# Patient Record
Sex: Female | Born: 1960
Health system: Southern US, Community
[De-identification: ages and names within clinical notes are randomized; demographics above are authoritative.]

## PROBLEM LIST (undated history)

## (undated) DIAGNOSIS — F419 Anxiety disorder, unspecified: Secondary | ICD-10-CM

## (undated) DIAGNOSIS — Z96651 Presence of right artificial knee joint: Secondary | ICD-10-CM

## (undated) DIAGNOSIS — I219 Acute myocardial infarction, unspecified: Secondary | ICD-10-CM

## (undated) DIAGNOSIS — E119 Type 2 diabetes mellitus without complications: Secondary | ICD-10-CM

## (undated) DIAGNOSIS — I10 Essential (primary) hypertension: Secondary | ICD-10-CM

## (undated) DIAGNOSIS — M1712 Unilateral primary osteoarthritis, left knee: Secondary | ICD-10-CM

## (undated) DIAGNOSIS — F329 Major depressive disorder, single episode, unspecified: Secondary | ICD-10-CM

## (undated) DIAGNOSIS — I493 Ventricular premature depolarization: Secondary | ICD-10-CM

## (undated) DIAGNOSIS — F32A Depression, unspecified: Secondary | ICD-10-CM

## (undated) DIAGNOSIS — R0602 Shortness of breath: Secondary | ICD-10-CM

## (undated) DIAGNOSIS — N179 Acute kidney failure, unspecified: Secondary | ICD-10-CM

## (undated) DIAGNOSIS — M1990 Unspecified osteoarthritis, unspecified site: Secondary | ICD-10-CM

## (undated) DIAGNOSIS — R51 Headache: Secondary | ICD-10-CM

## (undated) DIAGNOSIS — R519 Headache, unspecified: Secondary | ICD-10-CM

## (undated) DIAGNOSIS — I255 Ischemic cardiomyopathy: Secondary | ICD-10-CM

## (undated) DIAGNOSIS — N189 Chronic kidney disease, unspecified: Secondary | ICD-10-CM

## (undated) DIAGNOSIS — K219 Gastro-esophageal reflux disease without esophagitis: Secondary | ICD-10-CM

## (undated) DIAGNOSIS — R002 Palpitations: Secondary | ICD-10-CM

## (undated) HISTORY — DX: Shortness of breath: R06.02

## (undated) HISTORY — DX: Acute kidney failure, unspecified: N17.9

## (undated) HISTORY — DX: Ischemic cardiomyopathy: I25.5

## (undated) HISTORY — PX: CHOLECYSTECTOMY: SHX55

## (undated) HISTORY — DX: Chronic kidney disease, unspecified: N18.9

## (undated) HISTORY — DX: Palpitations: R00.2

## (undated) HISTORY — PX: ABDOMINAL HYSTERECTOMY: SHX81

---

## 1998-03-19 ENCOUNTER — Other Ambulatory Visit: Admission: RE | Admit: 1998-03-19 | Discharge: 1998-03-19 | Payer: Self-pay | Admitting: Obstetrics and Gynecology

## 1999-12-14 ENCOUNTER — Other Ambulatory Visit: Admission: RE | Admit: 1999-12-14 | Discharge: 1999-12-14 | Payer: Self-pay | Admitting: Internal Medicine

## 2000-03-24 ENCOUNTER — Emergency Department (HOSPITAL_COMMUNITY): Admission: EM | Admit: 2000-03-24 | Discharge: 2000-03-24 | Payer: Self-pay | Admitting: Emergency Medicine

## 2000-03-24 ENCOUNTER — Encounter: Payer: Self-pay | Admitting: Emergency Medicine

## 2000-03-28 ENCOUNTER — Ambulatory Visit (HOSPITAL_COMMUNITY): Admission: RE | Admit: 2000-03-28 | Discharge: 2000-03-28 | Payer: Self-pay | Admitting: *Deleted

## 2001-01-24 ENCOUNTER — Other Ambulatory Visit: Admission: RE | Admit: 2001-01-24 | Discharge: 2001-01-24 | Payer: Self-pay | Admitting: Internal Medicine

## 2003-01-13 ENCOUNTER — Other Ambulatory Visit: Admission: RE | Admit: 2003-01-13 | Discharge: 2003-01-13 | Payer: Self-pay | Admitting: Obstetrics and Gynecology

## 2003-02-25 ENCOUNTER — Encounter: Admission: RE | Admit: 2003-02-25 | Discharge: 2003-02-25 | Payer: Self-pay | Admitting: Internal Medicine

## 2003-02-25 ENCOUNTER — Encounter: Payer: Self-pay | Admitting: Internal Medicine

## 2003-03-31 ENCOUNTER — Encounter (INDEPENDENT_AMBULATORY_CARE_PROVIDER_SITE_OTHER): Payer: Self-pay | Admitting: Specialist

## 2003-03-31 ENCOUNTER — Observation Stay (HOSPITAL_COMMUNITY): Admission: RE | Admit: 2003-03-31 | Discharge: 2003-04-01 | Payer: Self-pay | Admitting: Obstetrics and Gynecology

## 2003-09-05 ENCOUNTER — Encounter: Admission: RE | Admit: 2003-09-05 | Discharge: 2003-10-21 | Payer: Self-pay | Admitting: Internal Medicine

## 2004-02-09 ENCOUNTER — Emergency Department (HOSPITAL_COMMUNITY): Admission: EM | Admit: 2004-02-09 | Discharge: 2004-02-09 | Payer: Self-pay | Admitting: *Deleted

## 2004-02-13 ENCOUNTER — Inpatient Hospital Stay (HOSPITAL_COMMUNITY): Admission: AD | Admit: 2004-02-13 | Discharge: 2004-02-13 | Payer: Self-pay | Admitting: Obstetrics and Gynecology

## 2004-10-19 ENCOUNTER — Other Ambulatory Visit: Admission: RE | Admit: 2004-10-19 | Discharge: 2004-10-19 | Payer: Self-pay | Admitting: Obstetrics and Gynecology

## 2004-11-29 ENCOUNTER — Encounter: Admission: RE | Admit: 2004-11-29 | Discharge: 2004-11-29 | Payer: Self-pay | Admitting: Obstetrics and Gynecology

## 2004-12-01 ENCOUNTER — Encounter: Admission: RE | Admit: 2004-12-01 | Discharge: 2005-03-01 | Payer: Self-pay | Admitting: Family Medicine

## 2010-02-09 ENCOUNTER — Encounter: Admission: RE | Admit: 2010-02-09 | Discharge: 2010-02-09 | Payer: Self-pay | Admitting: Family Medicine

## 2011-01-07 NOTE — Discharge Summary (Signed)
   NAME:  Andrea Robertson, Andrea Robertson                        ACCOUNT NO.:  [id number]   MEDICAL RECORD NO.:  [id number]                   PATIENT TYPE:  OBV   LOCATION:  9324                                 FACILITY:  [facility]   PHYSICIAN:  Duke Salvia. Marcelle Overlie, M.D.            DATE OF BIRTH:  07/02/1961   DATE OF ADMISSION:  03/31/2003  DATE OF DISCHARGE:  04/01/2003                                 DISCHARGE SUMMARY   DISCHARGE DIAGNOSES:  1. Symptomatic uterine prolapse.  2. Total vaginal hysterectomy this admission.   SUMMARY OF THE HISTORY AND PHYSICAL EXAM:  Please see admission H&P for  details.  Briefly, a 50 year old with symptomatic uterine prolapse presents  for definitive TVH.   HOSPITAL COURSE:  On March 31, 2003, under general anesthesia, the patient  underwent an uncomplicated TVH.  EBL was 150 cc.  Catheter was removed  that  evening.  Her diet was advanced.  By the following morning, she was  afebrile, tolerating a regular diet and was ready for discharge.   LABORATORY DATA:  Preop lab work:  C-MET potassium was slightly low at 3.4.  Preop hemoglobin was 12.1.  UPT was negative.  Blood type is O positive.  UA  was negative except for trace hemoglobin.  Her postop hemoglobin on April 01, 2003 was 11.2.   DISPOSITION:  1. The patient was discharged on Tylox p.r.n. pain.  2. Return to the office in one week.  3. Advised to report any increased vaginal bleeding, fever over 101,     increased pain, persistent nausea, vomiting.  4. She was given specific instructions regarding diet, sex, exercise.   CONDITION:  Good.   ACTIVITY:  Gradually increase as tolerated.                                               Richard M. Marcelle Overlie, M.D.    RMH/MEDQ  D:  04/01/2003  T:  04/01/2003  Job:  [id number]

## 2011-01-07 NOTE — Op Note (Signed)
NAME:  Andrea Robertson, Andrea Robertson                        ACCOUNT NO.:  [id number]   MEDICAL RECORD NO.:  [id number]                   PATIENT TYPE:  INP   LOCATION:  NA                                   FACILITY:  [facility]   PHYSICIAN:  Duke Salvia. Marcelle Overlie, M.D.            DATE OF BIRTH:  Jan 09, 1961   DATE OF PROCEDURE:  03/31/2003  DATE OF DISCHARGE:                                 OPERATIVE REPORT   PREOPERATIVE DIAGNOSIS:  Symptomatic uterine prolapse.   POSTOPERATIVE DIAGNOSIS:  Symptomatic uterine prolapse.   PROCEDURE:  Total vaginal hysterectomy.   SURGEON:  Duke Salvia. Marcelle Overlie, M.D.   ASSISTANT:  Freddy Finner, M.D.   ANESTHESIA:  General endotracheal.   COMPLICATIONS:  None.   DRAINS:  Foley catheter.   ESTIMATED BLOOD LOSS:  150.   SPECIMENS:  Uterus.   DESCRIPTION OF PROCEDURE:  The patient went to the operating room and after  an adequate level of general endotracheal anesthesia was obtained with the  patient's legs in stirrups, the perineum and vagina were prepped with  Betadine, the bladder was drained, EUA carried, uterus was mid position  normal size, adnexa negative. A weighted speculum was positioned. The cervix  could be seen at the introitus. Cervix passed with a tenaculum, the cervical  vaginal mucosa was incised with a scalpel. Posterior culdotomy performed  without difficulty. Using the Ligasure system, the uterosacral ligament was  clamped, coagulated and cut, same on the opposite side. The bladder was  advanced superiorly until the peritoneal reflection could be identified.  This was entered sharply and a retractor used to gently elevate the bladder  out of the field. In sequential manner, the cardinal ligament, uterine  vasculature pedicle and upper broad ligament pedicles were clamped,  coagulated and cut. The fundus and the uterus was then delivered  posteriorly. The remaining utero-ovarian pedicles were clamped and divided,  specimen removed. Both ovaries  were inspected and noted to be small and  normal. These pedicles were tied with free tie followed by suture ligature  of #0 Vicryl. The cuff was closed with running 2-0 Dexon locked suture from  3 to 9 o'clock. A McCall culdoplasty suture was then placed from the left  uterosacral ligament, picking up the posterior peritoneum, attached to the  right uterosacral ligament and tied down for extra posterior support. Prior  to closure sponge, needle and instrument counts  were reported as correct x2. The remainder of the cuff was closed  transversely with figure-of-eight 2-0 Monocryl sutures. The cuff was  hemostatic, the bladder was drained with a Foley. She tolerated this well  and went to the recovery room in good condition.  Richard M. Marcelle Overlie, M.D.    RMH/MEDQ  D:  03/31/2003  T:  03/31/2003  Job:  [id number]

## 2011-01-07 NOTE — H&P (Signed)
   NAME:  Andrea Robertson, Andrea Robertson                        ACCOUNT NO.:  [id number]   MEDICAL RECORD NO.:  [id number]                   PATIENT TYPE:  INP   LOCATION:  NA                                   [facility]  [facility]   PHYSICIAN:  Duke Salvia. Marcelle Overlie, M.D.            DATE OF BIRTH:  05/23/1961   DATE OF ADMISSION:  DATE OF DISCHARGE:                                HISTORY & PHYSICAL   DATE OF SCHEDULED SURGERY:  Is March 31, 2003.   CHIEF COMPLAINT:  Pelvic pressure, uterine prolapse.   HISTORY OF PRESENT ILLNESS:  A 50 year old G4, P2, prior tubal.  When she  was last seen in my office in 2001, she had symptomatic uterine prolapse.  I  had not seen her in several years and when she returned in the Spring of  this year was still complaining of uterine prolapse.  On exam, she had  uterine descensus of a moderate degree, anterior and posterior support was  good.  She presents now for [facility].  This procedure including risk of bleeding,  infection, adjacent organ injury, the possible need for open or additional  surgery discussed.  Other risks regarding infection, phlebitis, transfusion  reviewed.   MEDICATIONS:  1. Clarinex p.r.n.  2. Zoloft.  3. Norvasc 10 mg daily.  4. Diovan/HCTZ 80/12.5.   REVIEW OF SYSTEMS:  Significant for essential hypertension, positive sickle  cell trait.  She has two living children, both delivered vaginally.  A prior  tubal ligation in 1996 findings at the time were normal.   FAMILY HISTORY:  Significant for father with acute MI.   ALLERGIES:  1. PENICILLIN.  2. ERYTHROMYCIN.   PHYSICAL EXAMINATION:  VITAL SIGNS:  Temp 98.2, blood pressure 140/98.  HEENT:  Unremarkable.  NECK:  Supple without mass.  LUNGS:  Clear.  CARDIOVASCULAR:  Regular, rate and rhythm without murmurs, rubs or gallops  noted.  BREASTS:  Without masses.  ABDOMEN:  Soft, flat, nontender.  PELVIC:  Normal external genitalia.  Vagina and cervix clear.  On straining,  the cervix is noted  at the introitus.  Anterior and posterior support is  good.  Adnexa unremarkable.  EXTREMITIES:  Unremarkable.  NEUROLOGIC:  Unremarkable.    IMPRESSION:  Symptomatic uterine descensus.   PLAN:  TVH procedure and risks reviewed as above.                                                Richard M. Marcelle Overlie, M.D.    RMH/MEDQ  D:  03/21/2003  T:  03/21/2003  Job:  [id number]

## 2011-01-07 NOTE — Procedures (Signed)
[place]  Patient:    Andrea Robertson, Andrea Robertson                     MRN: [id number] Adm. Date:  [id number] Attending:  Sabino Gasser                           Procedure Report  PROCEDURE:  Colonoscopy.  INDICATIONS:  Rectal bleeding.  ANESTHESIA:  Demerol 100 mg, Versed 10 mg were given intravenously in divided dose.  DESCRIPTION OF PROCEDURE:  With the patient mildly sedated in the left lateral decubitus position, the Olympus videoscopic colonoscope was inserted in the rectum and passed under direct vision to the cecum.  Cecum was identified by transillumination in the right lower quadrant and by visualization of the ileocecal valve and cecal cap.  Photographs were taken.  From this point, the colonoscope was slowly withdrawn after suctioning and diluting fecal debris from the cecum and slowly withdrawn to the rectum, which appeared normal on direct view and showed internal hemorrhoids on retroflexed view.  The endoscope was straightened and withdrawn.  The patients vital signs and pulse oximetry remained stable.  The patient tolerated the procedure well and without apparent complications.  FINDINGS:  Internal hemorrhoids, which may have been the cause of the patients rectal bleeding, otherwise unremarkable colonoscopic examination to the cecum.  A somewhat limited prep in the cecum, but otherwise unremarkable.  PLAN:  Follow up with me on an as-needed basis. DD:  03/28/00 TD:  03/28/00 Job: [id number]

## 2011-06-24 ENCOUNTER — Other Ambulatory Visit: Payer: Self-pay | Admitting: Internal Medicine

## 2011-06-24 DIAGNOSIS — M25561 Pain in right knee: Secondary | ICD-10-CM

## 2011-06-30 ENCOUNTER — Ambulatory Visit
Admission: RE | Admit: 2011-06-30 | Discharge: 2011-06-30 | Disposition: A | Discharge status: Home / Self Care | Payer: BC Managed Care – PPO | Source: Ambulatory Visit | Attending: Internal Medicine | Admitting: Internal Medicine

## 2011-06-30 DIAGNOSIS — M25561 Pain in right knee: Secondary | ICD-10-CM

## 2011-12-20 ENCOUNTER — Other Ambulatory Visit: Payer: Self-pay | Admitting: Internal Medicine

## 2011-12-20 DIAGNOSIS — Z1231 Encounter for screening mammogram for malignant neoplasm of breast: Secondary | ICD-10-CM

## 2011-12-21 ENCOUNTER — Ambulatory Visit: Payer: BC Managed Care – PPO

## 2011-12-22 ENCOUNTER — Ambulatory Visit
Admission: RE | Admit: 2011-12-22 | Discharge: 2011-12-22 | Disposition: A | Discharge status: Home / Self Care | Payer: BC Managed Care – PPO | Source: Ambulatory Visit | Attending: Internal Medicine | Admitting: Internal Medicine

## 2011-12-22 DIAGNOSIS — Z1231 Encounter for screening mammogram for malignant neoplasm of breast: Secondary | ICD-10-CM

## 2014-02-04 ENCOUNTER — Other Ambulatory Visit: Payer: Self-pay | Admitting: Internal Medicine

## 2014-02-04 DIAGNOSIS — M25512 Pain in left shoulder: Secondary | ICD-10-CM

## 2014-04-06 ENCOUNTER — Other Ambulatory Visit: Payer: BC Managed Care – PPO

## 2015-03-06 ENCOUNTER — Emergency Department (HOSPITAL_COMMUNITY)
Admission: EM | Admit: 2015-03-06 | Discharge: 2015-03-06 | Disposition: A | Discharge status: Home / Self Care | Payer: BLUE CROSS/BLUE SHIELD | Attending: Emergency Medicine | Admitting: Emergency Medicine

## 2015-03-06 ENCOUNTER — Emergency Department (HOSPITAL_COMMUNITY): Payer: BLUE CROSS/BLUE SHIELD

## 2015-03-06 ENCOUNTER — Encounter (HOSPITAL_COMMUNITY): Payer: Self-pay

## 2015-03-06 DIAGNOSIS — I493 Ventricular premature depolarization: Secondary | ICD-10-CM | POA: Diagnosis not present

## 2015-03-06 DIAGNOSIS — R002 Palpitations: Secondary | ICD-10-CM | POA: Diagnosis present

## 2015-03-06 DIAGNOSIS — I10 Essential (primary) hypertension: Secondary | ICD-10-CM | POA: Diagnosis not present

## 2015-03-06 DIAGNOSIS — F419 Anxiety disorder, unspecified: Secondary | ICD-10-CM | POA: Diagnosis not present

## 2015-03-06 DIAGNOSIS — E876 Hypokalemia: Secondary | ICD-10-CM | POA: Diagnosis not present

## 2015-03-06 DIAGNOSIS — Z880 Allergy status to penicillin: Secondary | ICD-10-CM | POA: Diagnosis not present

## 2015-03-06 HISTORY — DX: Depression, unspecified: F32.A

## 2015-03-06 HISTORY — DX: Major depressive disorder, single episode, unspecified: F32.9

## 2015-03-06 HISTORY — DX: Essential (primary) hypertension: I10

## 2015-03-06 LAB — CBC
HCT: 35.9 % — ABNORMAL LOW (ref 36.0–46.0)
HEMOGLOBIN: 12.5 g/dL (ref 12.0–15.0)
MCH: 28.4 pg (ref 26.0–34.0)
MCHC: 34.8 g/dL (ref 30.0–36.0)
MCV: 81.6 fL (ref 78.0–100.0)
PLATELETS: 320 10*3/uL (ref 150–400)
RBC: 4.4 MIL/uL (ref 3.87–5.11)
RDW: 14.1 % (ref 11.5–15.5)
WBC: 12.3 10*3/uL — AB (ref 4.0–10.5)

## 2015-03-06 LAB — BASIC METABOLIC PANEL
ANION GAP: 7 (ref 5–15)
BUN: 7 mg/dL (ref 6–20)
CO2: 26 mmol/L (ref 22–32)
Calcium: 9 mg/dL (ref 8.9–10.3)
Chloride: 105 mmol/L (ref 101–111)
Creatinine, Ser: 1.12 mg/dL — ABNORMAL HIGH (ref 0.44–1.00)
GFR, EST NON AFRICAN AMERICAN: 56 mL/min — AB (ref >60)
Glucose, Bld: 127 mg/dL — ABNORMAL HIGH (ref 65–99)
POTASSIUM: 3.3 mmol/L — AB (ref 3.5–5.1)
SODIUM: 138 mmol/L (ref 135–145)

## 2015-03-06 LAB — I-STAT TROPONIN, ED: Troponin i, poc: 0.01 ng/mL (ref 0.00–0.08)

## 2015-03-06 MED ORDER — POTASSIUM CHLORIDE CRYS ER 20 MEQ PO TBCR: 20.0000 meq | EXTENDED_RELEASE_TABLET | Freq: Every day | ORAL | Status: DC

## 2015-03-06 MED ORDER — POTASSIUM CHLORIDE CRYS ER 20 MEQ PO TBCR
40.0000 meq | EXTENDED_RELEASE_TABLET | Freq: Once | ORAL | Status: AC
  Administered 2015-03-06: 40 meq via ORAL
  Filled 2015-03-06: qty 2

## 2015-03-06 NOTE — ED Notes (Signed)
Pt reports she has felt her heart "skipping beats" x2 days.  Pt denies any chest pain but reports some mild SOB.  Pt is tearful upon assessment.

## 2015-03-06 NOTE — ED Notes (Signed)
Pt ambulated to the bathroom with ease 

## 2015-03-06 NOTE — Discharge Instructions (Signed)
It was our pleasure to provide your ER care today - we hope that you feel better.  Your potassium level is mildly low today (3.3) - take potassium supplement as prescribed, eat plenty of fruits and vegetables, and follow up with primary care doctor in 1 week for recheck.  Also have your blood pressure rechecked then as it is high today.   Avoid caffeine.  Return to ER if worse, persistent fast heart beat, fainting, chest pain, trouble breathing, other concern.         Premature Ventricular Contraction Premature ventricular contraction (PVC) is an irregularity of the heart rhythm involving extra or skipped heartbeats. In some cases, they may occur without obvious cause or heart disease. Other times, they can be caused by an electrolyte change in the blood. These need to be corrected. They can also be seen when there is not enough oxygen going to the heart. A common cause of this is plaque or cholesterol buildup. This buildup decreases the blood supply to the heart. In addition, extra beats may be caused or aggravated by:  Excessive smoking.  Alcohol consumption.  Caffeine.  Certain medications  Some street drugs. SYMPTOMS   The sensation of feeling your heart skipping a beat (palpitations).  In many cases, the person may have no symptoms. SIGNS AND TESTS   A physical examination may show an occasional irregularity, but if the PVC beats do not happen often, they may not be found on physical exam.  Blood pressure is usually normal.  Other tests that may find extra beats of the heart are:  An EKG (electrocardiogram)  A Holter monitor which can monitor your heart over longer periods of time  An Angiogram (study of the heart arteries). TREATMENT  Usually extra heartbeats do not need treatment. The condition is treated only if symptoms are severe or if extra beats are very frequent or are causing problems. An underlying cause, if discovered, may also require treatment.    Treatment may also be needed if there may be a risk for other more serious cardiac arrhythmias.  PREVENTION   Moderation in caffeine, alcohol, and tobacco use may reduce the risk of ectopic heartbeats in some people.  Exercise often helps people who lead a sedentary (inactive) lifestyle. PROGNOSIS  PVC heartbeats are generally harmless and do not need treatment.  RISKS AND COMPLICATIONS   Ventricular tachycardia (occasionally).  There usually are no complications.  Other arrhythmias (occasionally). SEEK IMMEDIATE MEDICAL CARE IF:   You feel palpitations that are frequent or continual.  You develop chest pain or other problems such as shortness of breath, sweating, or nausea and vomiting.  You become light-headed or faint (pass out).  You get worse or do not improve with treatment. Document Released: 03/25/2004 Document Revised: 10/31/2011 Document Reviewed: 10/05/2007 [facility] Patient Information 2015 [place], [place]. This information is not intended to replace advice given to you by your health care provider. Make sure you discuss any questions you have with your health care provider.    Hypokalemia Hypokalemia means that the amount of potassium in the blood is lower than normal.Potassium is a chemical, called an electrolyte, that helps regulate the amount of fluid in the body. It also stimulates muscle contraction and helps nerves function properly.Most of the body's potassium is inside of cells, and only a very small amount is in the blood. Because the amount in the blood is so small, minor changes can be life-threatening. CAUSES  Antibiotics.  Diarrhea or vomiting.  Using laxatives too  much, which can cause diarrhea.  Chronic kidney disease.  Water pills (diuretics).  Eating disorders (bulimia).  Low magnesium level.  Sweating a lot. SIGNS AND SYMPTOMS  Weakness.  Constipation.  Fatigue.  Muscle cramps.  Mental confusion.  Skipped heartbeats or  irregular heartbeat (palpitations).  Tingling or numbness. DIAGNOSIS  Your health care provider can diagnose hypokalemia with blood tests. In addition to checking your potassium level, your health care provider may also check other lab tests. TREATMENT Hypokalemia can be treated with potassium supplements taken by mouth or adjustments in your current medicines. If your potassium level is very low, you may need to get potassium through a vein (IV) and be monitored in the hospital. A diet high in potassium is also helpful. Foods high in potassium are:  Nuts, such as peanuts and pistachios.  Seeds, such as sunflower seeds and pumpkin seeds.  Peas, lentils, and lima beans.  Whole grain and bran cereals and breads.  Fresh fruit and vegetables, such as apricots, avocado, bananas, cantaloupe, kiwi, oranges, tomatoes, asparagus, and potatoes.  Orange and tomato juices.  Red meats.  Fruit yogurt. HOME CARE INSTRUCTIONS  Take all medicines as prescribed by your health care provider.  Maintain a healthy diet by including nutritious food, such as fruits, vegetables, nuts, whole grains, and lean meats.  If you are taking a laxative, be sure to follow the directions on the label. SEEK MEDICAL CARE IF:  Your weakness gets worse.  You feel your heart pounding or racing.  You are vomiting or having diarrhea.  You are diabetic and having trouble keeping your blood glucose in the normal range. SEEK IMMEDIATE MEDICAL CARE IF:  You have chest pain, shortness of breath, or dizziness.  You are vomiting or having diarrhea for more than 2 days.  You faint. MAKE SURE YOU:   Understand these instructions.  Will watch your condition.  Will get help right away if you are not doing well or get worse. Document Released: 08/08/2005 Document Revised: 05/29/2013 Document Reviewed: 02/08/2013 [facility] Patient Information 2015 [place], [place]. This information is not intended to replace advice  given to you by your health care provider. Make sure you discuss any questions you have with your health care provider.

## 2015-03-06 NOTE — ED Provider Notes (Signed)
CSN: [id number]     Arrival date & time 03/06/15  0177 History   First MD Initiated Contact with Patient 03/06/15 0940     Chief Complaint  Patient presents with  . Irregular Heart Beat     (Consider location/radiation/quality/duration/timing/severity/associated sxs/prior Treatment) The history is provided by the patient.  Patient c/o sense of palpitations which she described as a sense of heart skipping a beat, lasting a second or so per episode, for the past couple days. Occurred more frequently at rest last pm. States saw nurse at work and was told heart beat was irregular. No rapid/fast heartbeat. No faintness or dizziness. No current or recent chest pain or discomfort. No syncope. No unusual doe or fatigue. No hx dysrhythmia or cad. No recent change in meds or new meds. No heavy caffeine use or energy drink use. Denies any recent skin/hair changes, wt loss or gain, heat intolerance, or hx thyroid disease.       Past Medical History  Diagnosis Date  . Hypertension   . Depression    Past Surgical History  Procedure Laterality Date  . Abdominal hysterectomy    . Cholecystectomy     History reviewed. No pertinent family history. History  Substance Use Topics  . Smoking status: Never Smoker   . Smokeless tobacco: Not on file  . Alcohol Use: Yes     Comment: Social drinker   OB History    No data available     Review of Systems  Constitutional: Negative for fever and chills.  Eyes: Negative for redness.  Respiratory: Negative for shortness of breath.   Cardiovascular: Positive for palpitations. Negative for chest pain.  Gastrointestinal: Negative for abdominal pain.  Genitourinary: Negative for flank pain.  Musculoskeletal: Negative for back pain and neck pain.  Skin: Negative for rash.  Neurological: Negative for headaches.  Hematological: Does not bruise/bleed easily.  Psychiatric/Behavioral: Negative for confusion.      Allergies  Erythromycin; Penicillins;  and Zithromax  Home Medications   Prior to Admission medications   Not on File   BP 144/88 mmHg  Pulse 93  Temp(Src) 98.6 F (37 C) (Oral)  SpO2 99% Physical Exam  Constitutional: She appears well-developed and well-nourished. No distress.  HENT:  Mouth/Throat: Oropharynx is clear and moist.  Eyes: Conjunctivae are normal. No scleral icterus.  Neck: Neck supple. No tracheal deviation present. No thyromegaly present.  Cardiovascular: Normal rate, regular rhythm and intact distal pulses.  Exam reveals no gallop and no friction rub.   No murmur heard. Pulmonary/Chest: Effort normal and breath sounds normal. No respiratory distress.  Abdominal: Soft. Normal appearance and bowel sounds are normal. She exhibits no distension. There is no tenderness.  Musculoskeletal: She exhibits no edema or tenderness.  Neurological: She is alert.  Skin: Skin is warm and dry. No rash noted. She is not diaphoretic.  Psychiatric:  Anxious.  Nursing note and vitals reviewed.   ED Course  Procedures (including critical care time) Labs Review  Results for orders placed or performed during the hospital encounter of 03/06/15  Basic metabolic panel  Result Value Ref Range   Sodium 138 135 - 145 mmol/L   Potassium 3.3 (L) 3.5 - 5.1 mmol/L   Chloride 105 101 - 111 mmol/L   CO2 26 22 - 32 mmol/L   Glucose, Bld 127 (H) 65 - 99 mg/dL   BUN 7 6 - 20 mg/dL   Creatinine, Ser 9.39 (H) 0.44 - 1.00 mg/dL   Calcium 9.0 8.9 -  10.3 mg/dL   GFR calc non Af Amer 56 (L) >60 mL/min   GFR calc Af Amer >60 >60 mL/min   Anion gap 7 5 - 15  CBC  Result Value Ref Range   WBC 12.3 (H) 4.0 - 10.5 K/uL   RBC 4.40 3.87 - 5.11 MIL/uL   Hemoglobin 12.5 12.0 - 15.0 g/dL   HCT 78.2 (L) 95.6 - 21.3 %   MCV 81.6 78.0 - 100.0 fL   MCH 28.4 26.0 - 34.0 pg   MCHC 34.8 30.0 - 36.0 g/dL   RDW 08.6 57.8 - 46.9 %   Platelets 320 150 - 400 K/uL  I-stat troponin, ED  Result Value Ref Range   Troponin i, poc 0.01 0.00 - 0.08  ng/mL   Comment 3           Dg Chest 2 View  03/06/2015   CLINICAL DATA:  Shortness of breath with cardiac palpitations.  EXAM: CHEST  2 VIEW  COMPARISON:  None.  FINDINGS: Lungs are clear. Heart size and pulmonary vascularity are normal. No adenopathy. No bone lesions. There is slight thoracolumbar levoscoliosis.  IMPRESSION: No edema or consolidation.   Electronically Signed   By: Bretta Bang III M.D.   On: 03/06/2015 10:04       EKG Interpretation   Date/Time:  Friday March 06 2015 09:31:02 EDT Ventricular Rate:  92 PR Interval:  156 QRS Duration: 86 QT Interval:  376 QTC Calculation: 465 R Axis:   0 Text Interpretation:  Sinus tachycardia Premature ventricular complexes No  significant change since last tracing Confirmed by Denton Lank  MD, Caryn Bee  ([id number]) on 03/06/2015 9:43:14 AM      MDM   Monitor. Labs.  pvcs on monitor correspond to pts symptoms.   Discussed pvcs w pt.  k mildly low, kcl po.  Recheck no dysrhythmia except occ pvc on monitor.  Pt appears stable for d/c.   kcl po rx. pcp f/u.        Cathren Laine, MD 03/06/15 1106

## 2015-09-29 ENCOUNTER — Other Ambulatory Visit: Payer: Self-pay | Admitting: Surgical

## 2015-10-02 NOTE — Patient Instructions (Signed)
Andrea Robertson  10/02/2015   Your procedure is scheduled on: October 13, 2015  Report to [facility] Main  Entrance take [facility]  elevators to 3rd floor to  Short Stay Center at  10:30 AM.  Call this number if you have problems the morning of surgery [phone]   Remember: ONLY 1 PERSON MAY GO WITH YOU TO SHORT STAY TO GET  READY MORNING OF YOUR SURGERY.  Do not eat food or drink liquids :After Midnight.     Take these medicines the morning of surgery with A SIP OF WATER: Amlodipine (Norvasc), Wellbutrin, Prozac DO NOT TAKE ANY DIABETIC MEDICATIONS DAY OF YOUR SURGERY                               You may not have any metal on your body including hair pins and              piercings  Do not wear jewelry, make-up, lotions, powders or perfumes, deodorant             Do not wear nail polish.  Do not shave  48 hours prior to surgery.            Do not bring valuables to the hospital.  IS NOT             RESPONSIBLE   FOR VALUABLES.  Contacts, dentures or bridgework may not be worn into surgery.  Leave suitcase in the car. After surgery it may be brought to your room.     Special Instructions:coughing and deep breathing exercises, leg exercises              Please read over the following fact sheets you were given: _____________________________________________________________________             [facility] - Preparing for Surgery Before surgery, you can play an important role.  Because skin is not sterile, your skin needs to be as free of germs as possible.  You can reduce the number of germs on your skin by washing with CHG (chlorahexidine gluconate) soap before surgery.  CHG is an antiseptic cleaner which kills germs and bonds with the skin to continue killing germs even after washing. Please DO NOT use if you have an allergy to CHG or antibacterial soaps.  If your skin becomes reddened/irritated stop using the CHG and inform your nurse when you  arrive at Short Stay. Do not shave (including legs and underarms) for at least 48 hours prior to the first CHG shower.  You may shave your face/neck. Please follow these instructions carefully:  1.  Shower with CHG Soap the night before surgery and the  morning of Surgery.  2.  If you choose to wash your hair, wash your hair first as usual with your  normal  shampoo.  3.  After you shampoo, rinse your hair and body thoroughly to remove the  shampoo.                           4.  Use CHG as you would any other liquid soap.  You can apply chg directly  to the skin and wash                       Gently  with a scrungie or clean washcloth.  5.  Apply the CHG Soap to your body ONLY FROM THE NECK DOWN.   Do not use on face/ open                           Wound or open sores. Avoid contact with eyes, ears mouth and genitals (private parts).                       Wash face,  Genitals (private parts) with your normal soap.             6.  Wash thoroughly, paying special attention to the area where your surgery  will be performed.  7.  Thoroughly rinse your body with warm water from the neck down.  8.  DO NOT shower/wash with your normal soap after using and rinsing off  the CHG Soap.                9.  Pat yourself dry with a clean towel.            10.  Wear clean pajamas.            11.  Place clean sheets on your bed the night of your first shower and do not  sleep with pets. Day of Surgery : Do not apply any lotions/deodorants the morning of surgery.  Please wear clean clothes to the hospital/surgery center.  FAILURE TO FOLLOW THESE INSTRUCTIONS MAY RESULT IN THE CANCELLATION OF YOUR SURGERY PATIENT SIGNATURE_________________________________  NURSE SIGNATURE__________________________________  ________________________________________________________________________  WHAT IS A BLOOD TRANSFUSION? Blood Transfusion Information  A transfusion is the replacement of blood or some of its parts. Blood  is made up of multiple cells which provide different functions.  Red blood cells carry oxygen and are used for blood loss replacement.  White blood cells fight against infection.  Platelets control bleeding.  Plasma helps clot blood.  Other blood products are available for specialized needs, such as hemophilia or other clotting disorders. BEFORE THE TRANSFUSION  Who gives blood for transfusions?   Healthy volunteers who are fully evaluated to make sure their blood is safe. This is blood bank blood. Transfusion therapy is the safest it has ever been in the practice of medicine. Before blood is taken from a donor, a complete history is taken to make sure that person has no history of diseases nor engages in risky social behavior (examples are intravenous drug use or sexual activity with multiple partners). The donor's travel history is screened to minimize risk of transmitting infections, such as malaria. The donated blood is tested for signs of infectious diseases, such as HIV and hepatitis. The blood is then tested to be sure it is compatible with you in order to minimize the chance of a transfusion reaction. If you or a relative donates blood, this is often done in anticipation of surgery and is not appropriate for emergency situations. It takes many days to process the donated blood. RISKS AND COMPLICATIONS Although transfusion therapy is very safe and saves many lives, the main dangers of transfusion include:  1. Getting an infectious disease. 2. Developing a transfusion reaction. This is an allergic reaction to something in the blood you were given. Every precaution is taken to prevent this. The decision to have a blood transfusion has been considered carefully by your caregiver before blood is given. Blood is not given unless the benefits outweigh the  risks. AFTER THE TRANSFUSION  Right after receiving a blood transfusion, you will usually feel much better and more energetic. This is  especially true if your red blood cells have gotten low (anemic). The transfusion raises the level of the red blood cells which carry oxygen, and this usually causes an energy increase.  The nurse administering the transfusion will monitor you carefully for complications. HOME CARE INSTRUCTIONS  No special instructions are needed after a transfusion. You may find your energy is better. Speak with your caregiver about any limitations on activity for underlying diseases you may have. SEEK MEDICAL CARE IF:   Your condition is not improving after your transfusion.  You develop redness or irritation at the intravenous (IV) site. SEEK IMMEDIATE MEDICAL CARE IF:  Any of the following symptoms occur over the next 12 hours:  Shaking chills.  You have a temperature by mouth above 102 F (38.9 C), not controlled by medicine.  Chest, back, or muscle pain.  People around you feel you are not acting correctly or are confused.  Shortness of breath or difficulty breathing.  Dizziness and fainting.  You get a rash or develop hives.  You have a decrease in urine output.  Your urine turns a dark color or changes to pink, red, or brown. Any of the following symptoms occur over the next 10 days:  You have a temperature by mouth above 102 F (38.9 C), not controlled by medicine.  Shortness of breath.  Weakness after normal activity.  The white part of the eye turns yellow (jaundice).  You have a decrease in the amount of urine or are urinating less often.  Your urine turns a dark color or changes to pink, red, or brown. Document Released: 08/05/2000 Document Revised: 10/31/2011 Document Reviewed: 03/24/2008 [employer] Patient Information 2014 [place], [place].  _______________________________________________________________________  Incentive Spirometer  An incentive spirometer is a tool that can help keep your lungs clear and active. This tool measures how well you are filling your lungs  with each breath. Taking long deep breaths may help reverse or decrease the chance of developing breathing (pulmonary) problems (especially infection) following:  A long period of time when you are unable to move or be active. BEFORE THE PROCEDURE   If the spirometer includes an indicator to show your best effort, your nurse or respiratory therapist will set it to a desired goal.  If possible, sit up straight or lean slightly forward. Try not to slouch.  Hold the incentive spirometer in an upright position. INSTRUCTIONS FOR USE  3. Sit on the edge of your bed if possible, or sit up as far as you can in bed or on a chair. 4. Hold the incentive spirometer in an upright position. 5. Breathe out normally. 6. Place the mouthpiece in your mouth and seal your lips tightly around it. 7. Breathe in slowly and as deeply as possible, raising the piston or the ball toward the top of the column. 8. Hold your breath for 3-5 seconds or for as long as possible. Allow the piston or ball to fall to the bottom of the column. 9. Remove the mouthpiece from your mouth and breathe out normally. 10. Rest for a few seconds and repeat Steps 1 through 7 at least 10 times every 1-2 hours when you are awake. Take your time and take a few normal breaths between deep breaths. 11. The spirometer may include an indicator to show your best effort. Use the indicator as a goal to work toward  during each repetition. 12. After each set of 10 deep breaths, practice coughing to be sure your lungs are clear. If you have an incision (the cut made at the time of surgery), support your incision when coughing by placing a pillow or rolled up towels firmly against it. Once you are able to get out of bed, walk around indoors and cough well. You may stop using the incentive spirometer when instructed by your caregiver.  RISKS AND COMPLICATIONS  Take your time so you do not get dizzy or light-headed.  If you are in pain, you may need to  take or ask for pain medication before doing incentive spirometry. It is harder to take a deep breath if you are having pain. AFTER USE  Rest and breathe slowly and easily.  It can be helpful to keep track of a log of your progress. Your caregiver can provide you with a simple table to help with this. If you are using the spirometer at home, follow these instructions: SEEK MEDICAL CARE IF:   You are having difficultly using the spirometer.  You have trouble using the spirometer as often as instructed.  Your pain medication is not giving enough relief while using the spirometer.  You develop fever of 100.5 F (38.1 C) or higher. SEEK IMMEDIATE MEDICAL CARE IF:   You cough up bloody sputum that had not been present before.  You develop fever of 102 F (38.9 C) or greater.  You develop worsening pain at or near the incision site. MAKE SURE YOU:   Understand these instructions.  Will watch your condition.  Will get help right away if you are not doing well or get worse. Document Released: 12/19/2006 Document Revised: 10/31/2011 Document Reviewed: 02/19/2007 [facility] Patient Information 2014 [place], [place].   ________________________________________________________________________

## 2015-10-05 ENCOUNTER — Encounter (HOSPITAL_COMMUNITY)
Admission: RE | Admit: 2015-10-05 | Discharge: 2015-10-05 | Disposition: A | Discharge status: Home / Self Care | Payer: BLUE CROSS/BLUE SHIELD | Source: Ambulatory Visit | Attending: Orthopedic Surgery | Admitting: Orthopedic Surgery

## 2015-10-05 ENCOUNTER — Encounter (HOSPITAL_COMMUNITY): Payer: Self-pay

## 2015-10-05 DIAGNOSIS — Z01818 Encounter for other preprocedural examination: Secondary | ICD-10-CM

## 2015-10-05 DIAGNOSIS — Z0183 Encounter for blood typing: Secondary | ICD-10-CM | POA: Diagnosis not present

## 2015-10-05 DIAGNOSIS — Z01812 Encounter for preprocedural laboratory examination: Secondary | ICD-10-CM | POA: Insufficient documentation

## 2015-10-05 DIAGNOSIS — M1711 Unilateral primary osteoarthritis, right knee: Secondary | ICD-10-CM | POA: Insufficient documentation

## 2015-10-05 HISTORY — DX: Headache, unspecified: R51.9

## 2015-10-05 HISTORY — DX: Ventricular premature depolarization: I49.3

## 2015-10-05 HISTORY — DX: Headache: R51

## 2015-10-05 HISTORY — DX: Unspecified osteoarthritis, unspecified site: M19.90

## 2015-10-05 HISTORY — DX: Gastro-esophageal reflux disease without esophagitis: K21.9

## 2015-10-05 LAB — URINALYSIS, ROUTINE W REFLEX MICROSCOPIC
Bilirubin Urine: NEGATIVE
Glucose, UA: NEGATIVE mg/dL
Ketones, ur: NEGATIVE mg/dL
Leukocytes, UA: NEGATIVE
Nitrite: NEGATIVE
Protein, ur: NEGATIVE mg/dL
Specific Gravity, Urine: 1.015 (ref 1.005–1.030)
pH: 6 (ref 5.0–8.0)

## 2015-10-05 LAB — CBC WITH DIFFERENTIAL/PLATELET
Basophils Absolute: 0 10*3/uL (ref 0.0–0.1)
Basophils Relative: 0 %
Eosinophils Absolute: 0.2 10*3/uL (ref 0.0–0.7)
Eosinophils Relative: 1 %
HCT: 35.5 % — ABNORMAL LOW (ref 36.0–46.0)
Hemoglobin: 11.9 g/dL — ABNORMAL LOW (ref 12.0–15.0)
Lymphocytes Relative: 27 %
Lymphs Abs: 4.5 10*3/uL — ABNORMAL HIGH (ref 0.7–4.0)
MCH: 28.1 pg (ref 26.0–34.0)
MCHC: 33.5 g/dL (ref 30.0–36.0)
MCV: 83.7 fL (ref 78.0–100.0)
Monocytes Absolute: 1.1 10*3/uL — ABNORMAL HIGH (ref 0.1–1.0)
Monocytes Relative: 7 %
Neutro Abs: 11 10*3/uL — ABNORMAL HIGH (ref 1.7–7.7)
Neutrophils Relative %: 65 %
Platelets: 338 10*3/uL (ref 150–400)
RBC: 4.24 MIL/uL (ref 3.87–5.11)
RDW: 14.7 % (ref 11.5–15.5)
WBC: 16.8 10*3/uL — ABNORMAL HIGH (ref 4.0–10.5)

## 2015-10-05 LAB — PROTIME-INR
INR: 0.99 (ref 0.00–1.49)
Prothrombin Time: 13.3 seconds (ref 11.6–15.2)

## 2015-10-05 LAB — URINE MICROSCOPIC-ADD ON

## 2015-10-05 LAB — COMPREHENSIVE METABOLIC PANEL
ALT: 17 U/L (ref 14–54)
AST: 22 U/L (ref 15–41)
Albumin: 3.9 g/dL (ref 3.5–5.0)
Alkaline Phosphatase: 74 U/L (ref 38–126)
Anion gap: 8 (ref 5–15)
BUN: 13 mg/dL (ref 6–20)
CO2: 28 mmol/L (ref 22–32)
Calcium: 8.6 mg/dL — ABNORMAL LOW (ref 8.9–10.3)
Chloride: 98 mmol/L — ABNORMAL LOW (ref 101–111)
Creatinine, Ser: 1.05 mg/dL — ABNORMAL HIGH (ref 0.44–1.00)
GFR calc Af Amer: >60 mL/min (ref >60)
GFR calc non Af Amer: 60 mL/min — ABNORMAL LOW (ref >60)
Glucose, Bld: 96 mg/dL (ref 65–99)
Potassium: 3.2 mmol/L — ABNORMAL LOW (ref 3.5–5.1)
Sodium: 134 mmol/L — ABNORMAL LOW (ref 135–145)
Total Bilirubin: 0.5 mg/dL (ref 0.3–1.2)
Total Protein: 7.5 g/dL (ref 6.5–8.1)

## 2015-10-05 LAB — SURGICAL PCR SCREEN
MRSA, PCR: NEGATIVE
Staphylococcus aureus: NEGATIVE

## 2015-10-05 LAB — APTT: aPTT: 31 seconds (ref 24–37)

## 2015-10-05 LAB — ABO/RH: ABO/RH(D): O POS

## 2015-10-05 NOTE — Progress Notes (Signed)
10-05-15 - CBC w/diff and CMP lab results from preop visit on 10-05-15 faxed to Dr. Darrelyn Hillock via [facility]

## 2015-10-05 NOTE — Progress Notes (Signed)
10-05-15 - UA and UA microscopic lab results from preop visit on 10-05-15 faxed to Dr. Darrelyn Hillock via [employer].

## 2015-10-05 NOTE — Progress Notes (Signed)
03-06-15 - EKG - [facility] 03-06-15 - CXR - [facility]

## 2015-10-05 NOTE — Progress Notes (Signed)
   10/05/15 1336  OBSTRUCTIVE SLEEP APNEA  Have you ever been diagnosed with sleep apnea through a sleep study? No  Do you snore loudly (loud enough to be heard through closed doors)?  1  Do you often feel tired, fatigued, or sleepy during the daytime (such as falling asleep during driving or talking to someone)? 0  Has anyone observed you stop breathing during your sleep? 1  Do you have, or are you being treated for high blood pressure? 1  BMI more than 35 kg/m2? 1  Age > 50 (1-yes) 1  Neck circumference greater than:Female 16 inches or larger, Female 17inches or larger? 0  Female Gender (Yes=1) 0  Obstructive Sleep Apnea Score 5

## 2015-10-12 NOTE — H&P (Signed)
TOTAL KNEE ADMISSION H&P  Patient is being admitted for right total knee arthroplasty.  Subjective:  Chief Complaint:right knee pain.  HPI: Andrea Robertson, 55 y.o. female, has a history of pain and functional disability in the right knee due to arthritis and has failed non-surgical conservative treatments for greater than 12 weeks to includeNSAID's and/or analgesics, corticosteriod injections, flexibility and strengthening excercises and activity modification.  Onset of symptoms was gradual, starting >10 years ago with gradually worsening course since that time. The patient noted no past surgery on the right knee(s).  Patient currently rates pain in the right knee(s) at 7 out of 10 with activity. Patient has night pain, worsening of pain with activity and weight bearing, pain that interferes with activities of daily living, pain with passive range of motion, crepitus and joint swelling.  Patient has evidence of periarticular osteophytes and joint space narrowing by imaging studies.  There is no active infection.   Past Medical History  Diagnosis Date  . Hypertension   . Depression   . PVC's (premature ventricular contractions)   . GERD (gastroesophageal reflux disease)   . Headache     sinus headaches  . Arthritis     Past Surgical History  Procedure Laterality Date  . Abdominal hysterectomy    . Cholecystectomy        Current outpatient prescriptions:  .  amLODipine (NORVASC) 10 MG tablet, Take 10 mg by mouth daily., Disp: , Rfl:  .  buPROPion (WELLBUTRIN XL) 300 MG 24 hr tablet, Take 300 mg by mouth daily., Disp: , Rfl:  .  FLUoxetine (PROZAC) 20 MG capsule, Take 20 mg by mouth daily., Disp: , Rfl:  .  hydrochlorothiazide (HYDRODIURIL) 25 MG tablet, Take 25 mg by mouth every morning., Disp: , Rfl:  .  Multiple Vitamin (MULTIVITAMIN WITH MINERALS) TABS tablet, Take 1 tablet by mouth daily., Disp: , Rfl:  .  potassium chloride SA (K-DUR,KLOR-CON) 20 MEQ tablet, Take 1 tablet (20  mEq total) by mouth daily., Disp: 10 tablet, Rfl: 0 .  valsartan (DIOVAN) 320 MG tablet, Take 320 mg by mouth every morning., Disp: , Rfl:   Allergies  Allergen Reactions  . Erythromycin Nausea And Vomiting  . Penicillins Hives and Nausea And Vomiting    Has patient had a PCN reaction causing immediate rash, facial/tongue/throat swelling, SOB or lightheadedness with hypotension: No Has patient had a PCN reaction causing severe rash involving mucus membranes or skin necrosis: No Has patient had a PCN reaction that required hospitalization No Has patient had a PCN reaction occurring within the last 10 years: Yes If all of the above answers are "NO", then may proceed with Cephalosporin use.  Marland Kitchen Zithromax [Azithromycin] Nausea And Vomiting    Social History  Substance Use Topics  . Smoking status: Never Smoker   . Smokeless tobacco: Never Used  . Alcohol Use: Yes     Comment: Social drinker      Review of Systems  Constitutional: Negative.   HENT: Negative.   Eyes: Negative.   Respiratory: Negative.   Cardiovascular: Negative.   Gastrointestinal: Positive for diarrhea and constipation. Negative for heartburn, nausea, vomiting, abdominal pain, blood in stool and melena.  Genitourinary: Negative.   Musculoskeletal: Positive for myalgias and joint pain. Negative for back pain, falls and neck pain.  Skin: Negative.   Neurological: Negative.   Endo/Heme/Allergies: Negative.   Psychiatric/Behavioral: Negative.     Objective:  Physical Exam  Constitutional: She is oriented to person, place, and time.  She appears well-developed. No distress.  Obese  HENT:  Head: Normocephalic and atraumatic.  Right Ear: External ear normal.  Left Ear: External ear normal.  Nose: Nose normal.  Mouth/Throat: Oropharynx is clear and moist.  Eyes: Conjunctivae and EOM are normal.  Neck: Normal range of motion. Neck supple.  Cardiovascular: Normal rate, regular rhythm, normal heart sounds and intact  distal pulses.   No murmur heard. Respiratory: Effort normal and breath sounds normal. No respiratory distress. She has no wheezes.  GI: Soft. Bowel sounds are normal. She exhibits no distension. There is no tenderness.  Musculoskeletal:       Right hip: Normal.       Left hip: Normal.       Right knee: She exhibits decreased range of motion and swelling. She exhibits no effusion and no erythema. Tenderness found. Medial joint line and lateral joint line tenderness noted.       Left knee: Normal.  Neurological: She is alert and oriented to person, place, and time. She has normal strength and normal reflexes. No sensory deficit.  Skin: No rash noted. She is not diaphoretic. No erythema.  Psychiatric: She has a normal mood and affect. Her behavior is normal.    Vitals Weight: 186 lb Height: 62.5in Body Surface Area: 1.86 m Body Mass Index: 33.48 kg/m  Pulse: 80 (Regular)  BP: 140/80 (Sitting, Left Arm, Standard)  Imaging Review Plain radiographs demonstrate severe degenerative joint disease of the right knee(s). The overall alignment ismild varus. The bone quality appears to be good for age and reported activity level.  Assessment/Plan:  End stage primary osteoarthritis, right knee   The patient history, physical examination, clinical judgment of the provider and imaging studies are consistent with end stage degenerative joint disease of the right knee(s) and total knee arthroplasty is deemed medically necessary. The treatment options including medical management, injection therapy arthroscopy and arthroplasty were discussed at length. The risks and benefits of total knee arthroplasty were presented and reviewed. The risks due to aseptic loosening, infection, stiffness, patella tracking problems, thromboembolic complications and other imponderables were discussed. The patient acknowledged the explanation, agreed to proceed with the plan and consent was signed. Patient is being  admitted for inpatient treatment for surgery, pain control, PT, OT, prophylactic antibiotics, VTE prophylaxis, progressive ambulation and ADL's and discharge planning. The patient is planning to be discharged home with home health services    PCP: Boneta Lucks, FNP   Dimitri Ped, PA-C

## 2015-10-13 ENCOUNTER — Encounter (HOSPITAL_COMMUNITY)
Admission: AD | Disposition: A | Discharge status: Home Health | Payer: Self-pay | Source: Ambulatory Visit | Attending: Orthopedic Surgery

## 2015-10-13 ENCOUNTER — Inpatient Hospital Stay (HOSPITAL_COMMUNITY)
Admission: AD | Admit: 2015-10-13 | Discharge: 2015-10-15 | DRG: 470 | Disposition: A | Discharge status: Home Health | Payer: BLUE CROSS/BLUE SHIELD | Source: Ambulatory Visit | Attending: Orthopedic Surgery | Admitting: Orthopedic Surgery

## 2015-10-13 ENCOUNTER — Encounter (HOSPITAL_COMMUNITY): Payer: Self-pay | Admitting: *Deleted

## 2015-10-13 ENCOUNTER — Inpatient Hospital Stay (HOSPITAL_COMMUNITY): Payer: BLUE CROSS/BLUE SHIELD | Admitting: Certified Registered Nurse Anesthetist

## 2015-10-13 DIAGNOSIS — F329 Major depressive disorder, single episode, unspecified: Secondary | ICD-10-CM | POA: Diagnosis present

## 2015-10-13 DIAGNOSIS — I10 Essential (primary) hypertension: Secondary | ICD-10-CM | POA: Diagnosis present

## 2015-10-13 DIAGNOSIS — Z6833 Body mass index (BMI) 33.0-33.9, adult: Secondary | ICD-10-CM | POA: Diagnosis not present

## 2015-10-13 DIAGNOSIS — E669 Obesity, unspecified: Secondary | ICD-10-CM | POA: Diagnosis present

## 2015-10-13 DIAGNOSIS — K219 Gastro-esophageal reflux disease without esophagitis: Secondary | ICD-10-CM | POA: Diagnosis present

## 2015-10-13 DIAGNOSIS — M24561 Contracture, right knee: Secondary | ICD-10-CM | POA: Diagnosis present

## 2015-10-13 DIAGNOSIS — M1711 Unilateral primary osteoarthritis, right knee: Secondary | ICD-10-CM | POA: Diagnosis present

## 2015-10-13 DIAGNOSIS — M25561 Pain in right knee: Secondary | ICD-10-CM | POA: Diagnosis present

## 2015-10-13 DIAGNOSIS — Z01812 Encounter for preprocedural laboratory examination: Secondary | ICD-10-CM | POA: Diagnosis not present

## 2015-10-13 DIAGNOSIS — Z96659 Presence of unspecified artificial knee joint: Secondary | ICD-10-CM

## 2015-10-13 HISTORY — PX: TOTAL KNEE ARTHROPLASTY: SHX125

## 2015-10-13 LAB — BASIC METABOLIC PANEL
Anion gap: 11 (ref 5–15)
BUN: 12 mg/dL (ref 6–20)
CO2: 25 mmol/L (ref 22–32)
Calcium: 8.4 mg/dL — ABNORMAL LOW (ref 8.9–10.3)
Chloride: 99 mmol/L — ABNORMAL LOW (ref 101–111)
Creatinine, Ser: 1.06 mg/dL — ABNORMAL HIGH (ref 0.44–1.00)
GFR calc Af Amer: >60 mL/min (ref >60)
GFR calc non Af Amer: 59 mL/min — ABNORMAL LOW (ref >60)
Glucose, Bld: 198 mg/dL — ABNORMAL HIGH (ref 65–99)
Potassium: 3.5 mmol/L (ref 3.5–5.1)
Sodium: 135 mmol/L (ref 135–145)

## 2015-10-13 LAB — TYPE AND SCREEN
ABO/RH(D): O POS
Antibody Screen: NEGATIVE

## 2015-10-13 SURGERY — ARTHROPLASTY, KNEE, TOTAL
Anesthesia: General | Site: Knee | Laterality: Right

## 2015-10-13 MED ORDER — THROMBIN 5000 UNITS EX SOLR
OROMUCOSAL | Status: DC | PRN
  Administered 2015-10-13: 5 mL via TOPICAL

## 2015-10-13 MED ORDER — SODIUM CHLORIDE 0.9 % IJ SOLN
INTRAMUSCULAR | Status: DC | PRN
Start: 2015-10-13 — End: 2015-10-13
  Administered 2015-10-13: 20 mL

## 2015-10-13 MED ORDER — VANCOMYCIN HCL IN DEXTROSE 1-5 GM/200ML-% IV SOLN
1000.0000 mg | Freq: Two times a day (BID) | INTRAVENOUS | Status: AC
  Administered 2015-10-14: 1000 mg via INTRAVENOUS
  Filled 2015-10-13: qty 200

## 2015-10-13 MED ORDER — DEXAMETHASONE SODIUM PHOSPHATE 10 MG/ML IJ SOLN
INTRAMUSCULAR | Status: DC | PRN
  Administered 2015-10-13: 10 mg via INTRAVENOUS

## 2015-10-13 MED ORDER — PROPOFOL 10 MG/ML IV BOLUS
INTRAVENOUS | Status: DC | PRN
  Administered 2015-10-13: 200 mg via INTRAVENOUS

## 2015-10-13 MED ORDER — BUPIVACAINE LIPOSOME 1.3 % IJ SUSP
20.0000 mL | Freq: Once | INTRAMUSCULAR | Status: DC
  Filled 2015-10-13: qty 20

## 2015-10-13 MED ORDER — ROCURONIUM BROMIDE 100 MG/10ML IV SOLN
INTRAVENOUS | Status: DC | PRN
  Administered 2015-10-13: 40 mg via INTRAVENOUS

## 2015-10-13 MED ORDER — FENTANYL CITRATE (PF) 100 MCG/2ML IJ SOLN
INTRAMUSCULAR | Status: DC | PRN
  Administered 2015-10-13: 50 ug via INTRAVENOUS
  Administered 2015-10-13 (×2): 100 ug via INTRAVENOUS

## 2015-10-13 MED ORDER — DIPHENHYDRAMINE HCL 25 MG PO CAPS
25.0000 mg | ORAL_CAPSULE | Freq: Four times a day (QID) | ORAL | Status: DC | PRN
  Administered 2015-10-13 – 2015-10-14 (×4): 25 mg via ORAL
  Filled 2015-10-13 (×4): qty 1

## 2015-10-13 MED ORDER — SODIUM CHLORIDE 0.9 % IR SOLN
Status: DC | PRN
  Administered 2015-10-13: 500 mL

## 2015-10-13 MED ORDER — SODIUM CHLORIDE 0.9 % IR SOLN
Status: DC | PRN
  Administered 2015-10-13: 1000 mL

## 2015-10-13 MED ORDER — CHLORHEXIDINE GLUCONATE 4 % EX LIQD: 60.0000 mL | Freq: Once | CUTANEOUS | Status: DC

## 2015-10-13 MED ORDER — LIP MEDEX EX OINT
TOPICAL_OINTMENT | CUTANEOUS | Status: AC
  Filled 2015-10-13: qty 7

## 2015-10-13 MED ORDER — HYDROCHLOROTHIAZIDE 25 MG PO TABS
25.0000 mg | ORAL_TABLET | Freq: Every morning | ORAL | Status: DC
  Administered 2015-10-14 – 2015-10-15 (×2): 25 mg via ORAL
  Filled 2015-10-13 (×2): qty 1

## 2015-10-13 MED ORDER — PHENOL 1.4 % MT LIQD: 1.0000 | OROMUCOSAL | Status: DC | PRN

## 2015-10-13 MED ORDER — ACETAMINOPHEN 650 MG RE SUPP: 650.0000 mg | Freq: Four times a day (QID) | RECTAL | Status: DC | PRN

## 2015-10-13 MED ORDER — FLUOXETINE HCL 20 MG PO CAPS
20.0000 mg | ORAL_CAPSULE | Freq: Every day | ORAL | Status: DC
  Administered 2015-10-14 – 2015-10-15 (×2): 20 mg via ORAL
  Filled 2015-10-13 (×2): qty 1

## 2015-10-13 MED ORDER — ALUM & MAG HYDROXIDE-SIMETH 200-200-20 MG/5ML PO SUSP: 30.0000 mL | ORAL | Status: DC | PRN

## 2015-10-13 MED ORDER — BUPIVACAINE HCL (PF) 0.25 % IJ SOLN
INTRAMUSCULAR | Status: DC | PRN
  Administered 2015-10-13: 20 mL

## 2015-10-13 MED ORDER — AMLODIPINE BESYLATE 10 MG PO TABS
10.0000 mg | ORAL_TABLET | Freq: Every day | ORAL | Status: DC
  Administered 2015-10-14 – 2015-10-15 (×2): 10 mg via ORAL
  Filled 2015-10-13 (×3): qty 1

## 2015-10-13 MED ORDER — METHOCARBAMOL 500 MG PO TABS
500.0000 mg | ORAL_TABLET | Freq: Four times a day (QID) | ORAL | Status: DC | PRN
  Administered 2015-10-14 – 2015-10-15 (×3): 500 mg via ORAL
  Filled 2015-10-13 (×4): qty 1

## 2015-10-13 MED ORDER — ROCURONIUM BROMIDE 100 MG/10ML IV SOLN
INTRAVENOUS | Status: AC
  Filled 2015-10-13: qty 1

## 2015-10-13 MED ORDER — FERROUS SULFATE 325 (65 FE) MG PO TABS
325.0000 mg | ORAL_TABLET | Freq: Three times a day (TID) | ORAL | Status: DC
  Administered 2015-10-14 – 2015-10-15 (×4): 325 mg via ORAL
  Filled 2015-10-13 (×8): qty 1

## 2015-10-13 MED ORDER — MIDAZOLAM HCL 5 MG/5ML IJ SOLN
INTRAMUSCULAR | Status: DC | PRN
  Administered 2015-10-13: 2 mg via INTRAVENOUS

## 2015-10-13 MED ORDER — PROMETHAZINE HCL 25 MG/ML IJ SOLN
6.2500 mg | INTRAMUSCULAR | Status: DC | PRN
Start: 2015-10-13 — End: 2015-10-13

## 2015-10-13 MED ORDER — BUPIVACAINE HCL (PF) 0.25 % IJ SOLN
INTRAMUSCULAR | Status: AC
  Filled 2015-10-13: qty 30

## 2015-10-13 MED ORDER — LIDOCAINE HCL (CARDIAC) 20 MG/ML IV SOLN
INTRAVENOUS | Status: AC
  Filled 2015-10-13: qty 5

## 2015-10-13 MED ORDER — PROPOFOL 10 MG/ML IV BOLUS
INTRAVENOUS | Status: AC
  Filled 2015-10-13: qty 20

## 2015-10-13 MED ORDER — METHOCARBAMOL 1000 MG/10ML IJ SOLN
500.0000 mg | Freq: Four times a day (QID) | INTRAVENOUS | Status: DC | PRN
  Administered 2015-10-13: 500 mg via INTRAVENOUS
  Filled 2015-10-13 (×2): qty 5

## 2015-10-13 MED ORDER — DEXAMETHASONE SODIUM PHOSPHATE 10 MG/ML IJ SOLN
INTRAMUSCULAR | Status: AC
  Filled 2015-10-13: qty 1

## 2015-10-13 MED ORDER — ONDANSETRON HCL 4 MG/2ML IJ SOLN
INTRAMUSCULAR | Status: AC
  Filled 2015-10-13: qty 2

## 2015-10-13 MED ORDER — TRANEXAMIC ACID 1000 MG/10ML IV SOLN
1000.0000 mg | INTRAVENOUS | Status: AC
  Administered 2015-10-13: 1000 mg via INTRAVENOUS
  Filled 2015-10-13: qty 10

## 2015-10-13 MED ORDER — HYDROMORPHONE HCL 1 MG/ML IJ SOLN
0.2500 mg | INTRAMUSCULAR | Status: DC | PRN
  Administered 2015-10-13 (×4): 0.5 mg via INTRAVENOUS

## 2015-10-13 MED ORDER — HYDROMORPHONE HCL 2 MG PO TABS
2.0000 mg | ORAL_TABLET | ORAL | Status: DC | PRN
  Administered 2015-10-13 – 2015-10-15 (×8): 2 mg via ORAL
  Filled 2015-10-13 (×8): qty 1

## 2015-10-13 MED ORDER — HYDROMORPHONE HCL 1 MG/ML IJ SOLN
1.0000 mg | INTRAMUSCULAR | Status: DC | PRN
  Administered 2015-10-13 – 2015-10-14 (×4): 1 mg via INTRAVENOUS
  Filled 2015-10-13 (×5): qty 1

## 2015-10-13 MED ORDER — LIDOCAINE HCL (CARDIAC) 20 MG/ML IV SOLN
INTRAVENOUS | Status: DC | PRN
  Administered 2015-10-13: 100 mg via INTRAVENOUS

## 2015-10-13 MED ORDER — FLEET ENEMA 7-19 GM/118ML RE ENEM: 1.0000 | ENEMA | Freq: Once | RECTAL | Status: DC | PRN

## 2015-10-13 MED ORDER — LACTATED RINGERS IV SOLN
INTRAVENOUS | Status: DC
  Administered 2015-10-14: 05:00:00 via INTRAVENOUS

## 2015-10-13 MED ORDER — SUGAMMADEX SODIUM 200 MG/2ML IV SOLN
INTRAVENOUS | Status: DC | PRN
  Administered 2015-10-13: 200 mg via INTRAVENOUS

## 2015-10-13 MED ORDER — RIVAROXABAN 10 MG PO TABS
10.0000 mg | ORAL_TABLET | Freq: Every day | ORAL | Status: DC
  Administered 2015-10-14 – 2015-10-15 (×2): 10 mg via ORAL
  Filled 2015-10-13 (×3): qty 1

## 2015-10-13 MED ORDER — SUGAMMADEX SODIUM 200 MG/2ML IV SOLN
INTRAVENOUS | Status: AC
  Filled 2015-10-13: qty 2

## 2015-10-13 MED ORDER — VANCOMYCIN HCL IN DEXTROSE 1-5 GM/200ML-% IV SOLN
1000.0000 mg | INTRAVENOUS | Status: AC
  Administered 2015-10-13: 1000 mg via INTRAVENOUS
  Filled 2015-10-13: qty 200

## 2015-10-13 MED ORDER — IRBESARTAN 75 MG PO TABS
37.5000 mg | ORAL_TABLET | Freq: Every day | ORAL | Status: DC
  Administered 2015-10-14 – 2015-10-15 (×2): 37.5 mg via ORAL
  Filled 2015-10-13 (×3): qty 0.5

## 2015-10-13 MED ORDER — SODIUM CHLORIDE 0.9 % IJ SOLN
INTRAMUSCULAR | Status: AC
  Filled 2015-10-13: qty 50

## 2015-10-13 MED ORDER — ONDANSETRON HCL 4 MG PO TABS
4.0000 mg | ORAL_TABLET | Freq: Four times a day (QID) | ORAL | Status: DC | PRN
Start: 2015-10-13 — End: 2015-10-15

## 2015-10-13 MED ORDER — MENTHOL 3 MG MT LOZG: 1.0000 | LOZENGE | OROMUCOSAL | Status: DC | PRN

## 2015-10-13 MED ORDER — ONDANSETRON HCL 4 MG/2ML IJ SOLN
INTRAMUSCULAR | Status: DC | PRN
  Administered 2015-10-13: 4 mg via INTRAVENOUS

## 2015-10-13 MED ORDER — BUPROPION HCL ER (XL) 300 MG PO TB24
300.0000 mg | ORAL_TABLET | Freq: Every day | ORAL | Status: DC
Start: 2015-10-14 — End: 2015-10-15
  Administered 2015-10-14 – 2015-10-15 (×2): 300 mg via ORAL
  Filled 2015-10-13 (×2): qty 1

## 2015-10-13 MED ORDER — THROMBIN 5000 UNITS EX SOLR
CUTANEOUS | Status: AC
  Filled 2015-10-13: qty 5000

## 2015-10-13 MED ORDER — CELECOXIB 200 MG PO CAPS
200.0000 mg | ORAL_CAPSULE | Freq: Two times a day (BID) | ORAL | Status: DC
  Administered 2015-10-13 – 2015-10-15 (×4): 200 mg via ORAL
  Filled 2015-10-13 (×5): qty 1

## 2015-10-13 MED ORDER — SODIUM CHLORIDE 0.9 % IR SOLN
Status: AC
  Filled 2015-10-13: qty 1

## 2015-10-13 MED ORDER — HYDROMORPHONE HCL 1 MG/ML IJ SOLN
INTRAMUSCULAR | Status: AC
  Filled 2015-10-13: qty 1

## 2015-10-13 MED ORDER — FENTANYL CITRATE (PF) 250 MCG/5ML IJ SOLN
INTRAMUSCULAR | Status: AC
  Filled 2015-10-13: qty 5

## 2015-10-13 MED ORDER — ONDANSETRON HCL 4 MG/2ML IJ SOLN: 4.0000 mg | Freq: Four times a day (QID) | INTRAMUSCULAR | Status: DC | PRN

## 2015-10-13 MED ORDER — LACTATED RINGERS IV SOLN
INTRAVENOUS | Status: DC
  Administered 2015-10-13 (×3): via INTRAVENOUS

## 2015-10-13 MED ORDER — BISACODYL 5 MG PO TBEC: 5.0000 mg | DELAYED_RELEASE_TABLET | Freq: Every day | ORAL | Status: DC | PRN

## 2015-10-13 MED ORDER — POLYETHYLENE GLYCOL 3350 17 G PO PACK: 17.0000 g | PACK | Freq: Every day | ORAL | Status: DC | PRN

## 2015-10-13 MED ORDER — MIDAZOLAM HCL 2 MG/2ML IJ SOLN
INTRAMUSCULAR | Status: AC
  Filled 2015-10-13: qty 2

## 2015-10-13 MED ORDER — BUPIVACAINE LIPOSOME 1.3 % IJ SUSP
INTRAMUSCULAR | Status: DC | PRN
  Administered 2015-10-13: 20 mL

## 2015-10-13 MED ORDER — ACETAMINOPHEN 325 MG PO TABS: 650.0000 mg | ORAL_TABLET | Freq: Four times a day (QID) | ORAL | Status: DC | PRN

## 2015-10-13 MED ORDER — SUCCINYLCHOLINE CHLORIDE 20 MG/ML IJ SOLN
INTRAMUSCULAR | Status: DC | PRN
  Administered 2015-10-13: 100 mg via INTRAVENOUS

## 2015-10-13 MED ORDER — POTASSIUM CHLORIDE CRYS ER 20 MEQ PO TBCR
20.0000 meq | EXTENDED_RELEASE_TABLET | Freq: Every day | ORAL | Status: DC
  Administered 2015-10-14 – 2015-10-15 (×2): 20 meq via ORAL
  Filled 2015-10-13 (×3): qty 1

## 2015-10-13 SURGICAL SUPPLY — 71 items
BAG DECANTER FOR FLEXI CONT (MISCELLANEOUS) IMPLANT
BAG SPEC THK2 15X12 ZIP CLS (MISCELLANEOUS) ×1
BAG ZIPLOCK 12X15 (MISCELLANEOUS) ×1 IMPLANT
BANDAGE ACE 4X5 VEL STRL LF (GAUZE/BANDAGES/DRESSINGS) ×2 IMPLANT
BANDAGE ACE 6X5 VEL STRL LF (GAUZE/BANDAGES/DRESSINGS) ×2 IMPLANT
BLADE SAG 18X100X1.27 (BLADE) ×2 IMPLANT
BLADE SAW SGTL 11.0X1.19X90.0M (BLADE) ×2 IMPLANT
BNDG COHESIVE 4X5 TAN NS LF (GAUZE/BANDAGES/DRESSINGS) ×2 IMPLANT
BNDG GAUZE ELAST 4 BULKY (GAUZE/BANDAGES/DRESSINGS) ×1 IMPLANT
BONE CEMENT GENTAMICIN (Cement) ×4 IMPLANT
CAP KNEE TOTAL 3 SIGMA ×1 IMPLANT
CEMENT BONE GENTAMICIN 40 (Cement) ×2 IMPLANT
CLOTH BEACON ORANGE TIMEOUT ST (SAFETY) ×2 IMPLANT
CUFF TOURN SGL QUICK 34 (TOURNIQUET CUFF) ×2
CUFF TRNQT CYL 34X4X40X1 (TOURNIQUET CUFF) ×1 IMPLANT
DECANTER SPIKE VIAL GLASS SM (MISCELLANEOUS) ×2 IMPLANT
DRAPE INCISE IOBAN 66X45 STRL (DRAPES) IMPLANT
DRAPE U-SHAPE 47X51 STRL (DRAPES) ×2 IMPLANT
DRSG ADAPTIC 3X8 NADH LF (GAUZE/BANDAGES/DRESSINGS) ×1 IMPLANT
DRSG PAD ABDOMINAL 8X10 ST (GAUZE/BANDAGES/DRESSINGS) ×4 IMPLANT
DURAPREP 26ML APPLICATOR (WOUND CARE) ×2 IMPLANT
ELECT REM PT RETURN 9FT ADLT (ELECTROSURGICAL) ×2
ELECTRODE REM PT RTRN 9FT ADLT (ELECTROSURGICAL) ×1 IMPLANT
EVACUATOR 1/8 PVC DRAIN (DRAIN) ×2 IMPLANT
GAUZE SPONGE 4X4 12PLY STRL (GAUZE/BANDAGES/DRESSINGS) ×2 IMPLANT
GLOVE BIOGEL PI IND STRL 6.5 (GLOVE) ×1 IMPLANT
GLOVE BIOGEL PI IND STRL 7.0 (GLOVE) IMPLANT
GLOVE BIOGEL PI IND STRL 7.5 (GLOVE) IMPLANT
GLOVE BIOGEL PI IND STRL 8 (GLOVE) ×1 IMPLANT
GLOVE BIOGEL PI INDICATOR 6.5 (GLOVE) ×1
GLOVE BIOGEL PI INDICATOR 7.0 (GLOVE) ×2
GLOVE BIOGEL PI INDICATOR 7.5 (GLOVE) ×1
GLOVE BIOGEL PI INDICATOR 8 (GLOVE) ×1
GLOVE ECLIPSE 8.0 STRL XLNG CF (GLOVE) ×4 IMPLANT
GLOVE SURG SS PI 6.5 STRL IVOR (GLOVE) ×3 IMPLANT
GLOVE SURG SS PI 7.5 STRL IVOR (GLOVE) ×1 IMPLANT
GOWN STRL NON-REIN LRG LVL3 (GOWN DISPOSABLE) ×1 IMPLANT
GOWN STRL REUS W/TWL LRG LVL3 (GOWN DISPOSABLE) ×3 IMPLANT
GOWN STRL REUS W/TWL XL LVL3 (GOWN DISPOSABLE) ×2 IMPLANT
HANDPIECE INTERPULSE COAX TIP (DISPOSABLE) ×2
IMMOBILIZER KNEE 20 (SOFTGOODS) ×3 IMPLANT
IMMOBILIZER KNEE 20 THIGH 36 (SOFTGOODS) ×1 IMPLANT
LIQUID BAND (GAUZE/BANDAGES/DRESSINGS) ×1 IMPLANT
MANIFOLD NEPTUNE II (INSTRUMENTS) ×2 IMPLANT
NDL HYPO 21X1.5 SAFETY (NEEDLE) ×1 IMPLANT
NEEDLE HYPO 21X1.5 SAFETY (NEEDLE) ×2 IMPLANT
NEEDLE HYPO 22GX1.5 SAFETY (NEEDLE) ×2 IMPLANT
NS IRRIG 1000ML POUR BTL (IV SOLUTION) IMPLANT
PACK TOTAL KNEE CUSTOM (KITS) ×2 IMPLANT
PADDING CAST COTTON 6X4 STRL (CAST SUPPLIES) ×1 IMPLANT
PENCIL SMOKE EVAC W/HOLSTER (ELECTROSURGICAL) ×2 IMPLANT
POSITIONER SURGICAL ARM (MISCELLANEOUS) ×2 IMPLANT
SET HNDPC FAN SPRY TIP SCT (DISPOSABLE) ×1 IMPLANT
SET PAD KNEE POSITIONER (MISCELLANEOUS) ×2 IMPLANT
SPONGE LAP 18X18 X RAY DECT (DISPOSABLE) IMPLANT
SPONGE SURGIFOAM ABS GEL 100 (HEMOSTASIS) ×2 IMPLANT
STRIP CLOSURE SKIN 1/2X4 (GAUZE/BANDAGES/DRESSINGS) ×3 IMPLANT
SUT BONE WAX W31G (SUTURE) ×2 IMPLANT
SUT MNCRL AB 4-0 PS2 18 (SUTURE) ×2 IMPLANT
SUT VIC AB 1 CT1 27 (SUTURE) ×4
SUT VIC AB 1 CT1 27XBRD ANTBC (SUTURE) ×2 IMPLANT
SUT VIC AB 2-0 CT1 27 (SUTURE) ×6
SUT VIC AB 2-0 CT1 TAPERPNT 27 (SUTURE) ×3 IMPLANT
SUT VLOC 180 0 24IN GS25 (SUTURE) ×2 IMPLANT
SYR 20CC LL (SYRINGE) ×4 IMPLANT
TOWER CARTRIDGE SMART MIX (DISPOSABLE) ×2 IMPLANT
TRAY FOLEY W/METER SILVER 14FR (SET/KITS/TRAYS/PACK) ×1 IMPLANT
TRAY FOLEY W/METER SILVER 16FR (SET/KITS/TRAYS/PACK) ×2 IMPLANT
WATER STERILE IRR 1500ML POUR (IV SOLUTION) ×2 IMPLANT
WRAP KNEE MAXI GEL POST OP (GAUZE/BANDAGES/DRESSINGS) ×2 IMPLANT
YANKAUER SUCT BULB TIP 10FT TU (MISCELLANEOUS) ×2 IMPLANT

## 2015-10-13 NOTE — Interval H&P Note (Signed)
History and Physical Interval Note:  10/13/2015 12:24 PM  Andrea Robertson  has presented today for surgery, with the diagnosis of OA RIGHT KNEE   The various methods of treatment have been discussed with the patient and family. After consideration of risks, benefits and other options for treatment, the patient has consented to  Procedure(s): RIGHT TOTAL KNEE ARTHROPLASTY (Right) as a surgical intervention .  The patient's history has been reviewed, patient examined, no change in status, stable for surgery.  I have reviewed the patient's chart and labs.  Questions were answered to the patient's satisfaction.     Kolby Myung A

## 2015-10-13 NOTE — Anesthesia Procedure Notes (Signed)
Procedure Name: Intubation Date/Time: 10/13/2015 12:26 PM Performed by: Doran Clay Pre-anesthesia Checklist: Patient identified, Timeout performed, Emergency Drugs available, Patient being monitored and Suction available Patient Re-evaluated:Patient Re-evaluated prior to inductionOxygen Delivery Method: Circle system utilized Preoxygenation: Pre-oxygenation with 100% oxygen Intubation Type: IV induction and Cricoid Pressure applied Grade View: Grade I Tube size: 7.0 mm Number of attempts: 1 Airway Equipment and Method: Stylet Placement Confirmation: ETT inserted through vocal cords under direct vision,  breath sounds checked- equal and bilateral and positive ETCO2 Secured at: 22 cm Tube secured with: Tape Dental Injury: Teeth and Oropharynx as per pre-operative assessment

## 2015-10-13 NOTE — Anesthesia Postprocedure Evaluation (Signed)
Anesthesia Post Note  Patient: Andrea Robertson  Procedure(s) Performed: Procedure(s) (LRB): RIGHT TOTAL KNEE ARTHROPLASTY (Right)  Patient location during evaluation: PACU Anesthesia Type: General Level of consciousness: awake Pain management: pain level controlled Vital Signs Assessment: post-procedure vital signs reviewed and stable Respiratory status: spontaneous breathing Cardiovascular status: stable Anesthetic complications: no    Last Vitals:  Filed Vitals:   10/13/15 1730 10/13/15 1739  BP: 137/79   Pulse: 110 107  Temp: 36.7 C   Resp: 20 19    Last Pain:  Filed Vitals:   10/13/15 1745  PainSc: 0-No pain                 EDWARDS,Sharda Keddy

## 2015-10-13 NOTE — Progress Notes (Signed)
B met drawn by lab. 

## 2015-10-13 NOTE — Transfer of Care (Signed)
Immediate Anesthesia Transfer of Care Note  Patient: Andrea Robertson  Procedure(s) Performed: Procedure(s): RIGHT TOTAL KNEE ARTHROPLASTY (Right)  Patient Location: PACU  Anesthesia Type:General  Level of Consciousness: sedated  Airway & Oxygen Therapy: Patient Spontanous Breathing and Patient connected to face mask oxygen  Post-op Assessment: Report given to RN and Post -op Vital signs reviewed and stable  Post vital signs: Reviewed and stable  Last Vitals: There were no vitals filed for this visit.  Complications: No apparent anesthesia complications

## 2015-10-13 NOTE — Brief Op Note (Signed)
10/13/2015  2:08 PM  PATIENT:  Andrea Robertson  55 y.o. female  PRE-OPERATIVE DIAGNOSIS: Primary OA RIGHT KNEE   POST-OPERATIVE DIAGNOSIS: Primary OA RIGHT KNEE   PROCEDURE:  Procedure(s): RIGHT TOTAL KNEE ARTHROPLASTY (Right)  SURGEON:  Surgeon(s) and Role:    * Ranee Gosselin, MD - Primary  PHYSICIAN ASSISTANT: Dimitri Ped PA  ASSISTANTS: Dimitri Ped PA  ANESTHESIA:   general  EBL:  Total I/O In: 1000 [I.V.:1000] Out: 300 [Urine:300]  BLOOD ADMINISTERED:none  DRAINS: (One) Hemovact drain(s) in the Right Knee with  Suction Open   LOCAL MEDICATIONS USED:  MARCAINE 20cc of 0.25% Plain and 20cc of Exparel mixed with 20cc of Normal Saline.     SPECIMEN:  No Specimen  DISPOSITION OF SPECIMEN:  N/A  COUNTS:  YES  TOURNIQUET:   Total Tourniquet Time Documented: Thigh (Right) - 74 minutes Total: Thigh (Right) - 74 minutes   DICTATION: .Other Dictation: Dictation Number [id number]  PLAN OF CARE: Admit to inpatient   PATIENT DISPOSITION:  Stable in OR   Delay start of Pharmacological VTE agent (>24hrs) due to surgical blood loss or risk of bleeding: yes

## 2015-10-13 NOTE — Anesthesia Preprocedure Evaluation (Signed)
Anesthesia Evaluation  Patient identified by MRN, date of birth, ID band Patient awake    Reviewed: Allergy & Precautions, NPO status , Patient's Chart, lab work & pertinent test results  Airway Mallampati: II  TM Distance: >3 FB     Dental   Pulmonary    breath sounds clear to auscultation       Cardiovascular hypertension,  Rhythm:Regular Rate:Normal     Neuro/Psych    GI/Hepatic Neg liver ROS, GERD  ,  Endo/Other  negative endocrine ROS  Renal/GU negative Renal ROS     Musculoskeletal   Abdominal   Peds  Hematology   Anesthesia Other Findings   Reproductive/Obstetrics                             Anesthesia Physical Anesthesia Plan  ASA: III  Anesthesia Plan: General   Post-op Pain Management:    Induction: Intravenous  Airway Management Planned: Oral ETT  Additional Equipment:   Intra-op Plan:   Post-operative Plan: Extubation in OR  Informed Consent: I have reviewed the patients History and Physical, chart, labs and discussed the procedure including the risks, benefits and alternatives for the proposed anesthesia with the patient or authorized representative who has indicated his/her understanding and acceptance.   Dental advisory given  Plan Discussed with: CRNA and Anesthesiologist  Anesthesia Plan Comments:         Anesthesia Quick Evaluation

## 2015-10-14 ENCOUNTER — Encounter (HOSPITAL_COMMUNITY): Payer: Self-pay | Admitting: Orthopedic Surgery

## 2015-10-14 LAB — CBC
HEMATOCRIT: 32.7 % — AB (ref 36.0–46.0)
HEMOGLOBIN: 10.9 g/dL — AB (ref 12.0–15.0)
MCH: 28.2 pg (ref 26.0–34.0)
MCHC: 33.3 g/dL (ref 30.0–36.0)
MCV: 84.5 fL (ref 78.0–100.0)
Platelets: 314 10*3/uL (ref 150–400)
RBC: 3.87 MIL/uL (ref 3.87–5.11)
RDW: 14.9 % (ref 11.5–15.5)
WBC: 19.1 10*3/uL — ABNORMAL HIGH (ref 4.0–10.5)

## 2015-10-14 LAB — BASIC METABOLIC PANEL
Anion gap: 9 (ref 5–15)
BUN: 11 mg/dL (ref 6–20)
CALCIUM: 8.5 mg/dL — AB (ref 8.9–10.3)
CO2: 26 mmol/L (ref 22–32)
CREATININE: 0.97 mg/dL (ref 0.44–1.00)
Chloride: 100 mmol/L — ABNORMAL LOW (ref 101–111)
GFR calc Af Amer: >60 mL/min (ref >60)
GFR calc non Af Amer: >60 mL/min (ref >60)
GLUCOSE: 144 mg/dL — AB (ref 65–99)
Potassium: 3.8 mmol/L (ref 3.5–5.1)
Sodium: 135 mmol/L (ref 135–145)

## 2015-10-14 NOTE — Evaluation (Signed)
Physical Therapy Evaluation Patient Details Name: Andrea Robertson MRN: [id number] DOB: 1961-06-19 Today's Date: 10/14/2015   History of Present Illness  R TKA  Clinical Impression  Pt s/p R TKR presents with decreased R LE strength/ROM and post op pain limiting functional mobility.  Pt should progress well to dc home with family assist and HHPT follow up.    Follow Up Recommendations Home health PT    Equipment Recommendations  Rolling walker with 5" wheels    Recommendations for Other Services OT consult     Precautions / Restrictions Precautions Precautions: Fall;Knee Precaution Comments: reviewed knee precautions  Required Braces or Orthoses: Knee Immobilizer - Right Knee Immobilizer - Right: Discontinue once straight leg raise with < 10 degree lag Restrictions Weight Bearing Restrictions: No RLE Weight Bearing: Weight bearing as tolerated      Mobility  Bed Mobility Overal bed mobility: Needs Assistance Bed Mobility: Supine to Sit     Supine to sit: Min assist     General bed mobility comments: cues for sequence and use of L LE to self assist  Transfers Overall transfer level: Needs assistance Equipment used: Rolling walker (2 wheeled) Transfers: Sit to/from Stand Sit to Stand: Min assist         General transfer comment: cues for LE management and use of UEs to self assist  Ambulation/Gait Ambulation/Gait assistance: Min assist Ambulation Distance (Feet): 52 Feet Assistive device: Rolling walker (2 wheeled) Gait Pattern/deviations: Step-to pattern;Decreased step length - right;Decreased step length - left;Shuffle;Trunk flexed Gait velocity: decr Gait velocity interpretation: Below normal speed for age/gender General Gait Details: cues for sequence, posture and position from [employer]            Wheelchair Mobility    Modified Rankin (Stroke Patients Only)       Balance Overall balance assessment: Needs assistance Sitting-balance  support: No upper extremity supported;Feet supported Sitting balance-Leahy Scale: Good     Standing balance support: Bilateral upper extremity supported;During functional activity Standing balance-Leahy Scale: Good                               Pertinent Vitals/Pain Pain Assessment: 0-10 Pain Score: 3  Pain Location: R LE Pain Descriptors / Indicators: Aching;Sore Pain Intervention(s): Limited activity within patient's tolerance;Monitored during session;Premedicated before session;Ice applied    Home Living Family/patient expects to be discharged to:: Private residence Living Arrangements: Parent Available Help at Discharge: Family;Available 24 hours/day Type of Home: Apartment Home Access: Level entry     Home Layout: One level Home Equipment: None Additional Comments: Pt states she plans to discharge to her mother's one story condo for a couple of weeks prior to going home to her 2 story house     Prior Function Level of Independence: Independent               Hand Dominance   Dominant Hand: Right    Extremity/Trunk Assessment   Upper Extremity Assessment: Overall WFL for tasks assessed           Lower Extremity Assessment: RLE deficits/detail RLE Deficits / Details: 2+/5 quads with AAROM at knee -10-  40    Cervical / Trunk Assessment: Normal  Communication   Communication: No difficulties  Cognition Arousal/Alertness: Awake/alert Behavior During Therapy: WFL for tasks assessed/performed Overall Cognitive Status: Within Functional Limits for tasks assessed  General Comments      Exercises Total Joint Exercises Ankle Circles/Pumps: AROM;Both;15 reps;Supine Quad Sets: AROM;Both;10 reps;Supine Heel Slides: AAROM;Right;15 reps;Supine Straight Leg Raises: AAROM;Right;10 reps;Supine      Assessment/Plan    PT Assessment Patient needs continued PT services  PT Diagnosis Difficulty walking   PT Problem  List Decreased strength;Decreased range of motion;Decreased activity tolerance;Decreased mobility;Decreased knowledge of use of DME;Pain  PT Treatment Interventions DME instruction;Gait training;Stair training;Functional mobility training;Therapeutic activities;Therapeutic exercise;Patient/family education   PT Goals (Current goals can be found in the Care Plan section) Acute Rehab PT Goals Patient Stated Goal: go home soon PT Goal Formulation: With patient Potential to Achieve Goals: Good    Frequency 7X/week   Barriers to discharge        Co-evaluation               End of Session Equipment Utilized During Treatment: Gait belt;Right knee immobilizer Activity Tolerance: Patient tolerated treatment well Patient left: in chair;with call bell/phone within reach Nurse Communication: Mobility status         Time: 1914-7829 PT Time Calculation (min) (ACUTE ONLY): 33 min   Charges:   PT Evaluation $PT Eval Low Complexity: 1 Procedure PT Treatments $Therapeutic Exercise: 8-22 mins   PT G Codes:        Monalisa Bayless 10-29-2015, 12:20 PM

## 2015-10-14 NOTE — Op Note (Signed)
NAMEJORDIS, EZZO              ACCOUNT NO.:  [id number]  MEDICAL RECORD NO.:  [id number]  LOCATION:  1613                         FACILITY:  [facility]  PHYSICIAN:  Georges Lynch. Lorence Nagengast, M.D.DATE OF BIRTH:  08-Sep-1960  DATE OF PROCEDURE:  10/13/2015 DATE OF DISCHARGE:                              OPERATIVE REPORT   SURGEON:  Georges Lynch. Darrelyn Hillock, M.D.  ASSISTANT:  Dimitri Ped, [place].  PREOPERATIVE DIAGNOSES: 1. Severe primary osteoarthritis with bone-on-bone, right knee. 2. Slight flexion contracture, right knee.  POSTOPERATIVE DIAGNOSES: 1. Severe primary osteoarthritis with bone-on-bone, right knee. 2. Slight flexion contracture, right knee.  OPERATION:  Right total knee arthroplasty utilizing DePuy system.  The sizes used were as follows:  I used a size 3 right femoral component, posterior cruciate sacrificing type.  The tibial tray was a size 3.  The insert size 3, 15 mm thickness, patella was a size 35 with 3 pegs.  All 3 components were cemented.  Gentamicin was used in the cement.  DESCRIPTION OF PROCEDURE:  At this particular time, the sterile prep and draping of the knee was carried out.  Appropriate time-out was first carried out.  I also marked the appropriate right leg in the holding area.  At this particular time, leg was exsanguinated and Esmarch tourniquet was elevated to 325 mmHg.  The knee then was placed in the [facility] knee holder.  An anterior approach of the knee was carried out. Of note, she was given 1 g of IV vancomycin preop as well as 1 g of tranexamic acid.  At this time, the anterior approach of the knee was carried out.  Two flaps were created.  I then carried out a median parapatellar incision.  I then reflected the patella and did medial and lateral meniscectomies and incised the anterior, posterior, and cruciate ligaments.  We removed multiple large bone spurs in the tibia and femur. At this time, initial drill hole was made in the intercondylar notch.   A guide rod was inserted up the canal.  We thoroughly irrigated the canal after we removed the rod, and then removed 12 mm thickness off the distal femur at this time, mainly because of the tightness of the knee. I then measured the femur to be a size 3.  We did anterior, posterior, and chamfering cuts for a size 3 right femoral components.  Following that, we then measured the tibial tray to be a size 3.  Initial drill hole was made in the tibial plateau.  Guide rod was inserted.  We then thoroughly irrigated out the canal.  We then removed approximately 6 mm thickness off the affected medial side.  Of note, prior to doing all this, we did do our anterior, posterior, and chamfering cuts of the distal femur.  We then prepared the tibia in the fashion mentioned above.  We then inserted our lamina spreaders and removed the posterior spurs in the medial, lateral, and femoral condyle.  At this time, when this was completed, we then inserted our spacer blocks for measurement purposes.  We then continued to prepare the tibia.  We cut our keel cut out of the proximal tibia.  We then did our notch  cut out of the distal femur.  We inserted our trial components.  We went through range of motion, and felt that a 50-mm thickness insert was our best fit at this time.  The knee was stable, and we had good motion.  We then did a resurfacing procedure on the patella in the usual fashion.  I thoroughly irrigated out the knee.  We measured the patella as I mentioned to be a size 35.  After 3 drill holes were made in the patella, we then tried our trial component that fit nicely.  All trial components then were removed.  I water picked out the knee, dried the knee out, cemented all 3 components in simultaneously with gentamicin in the cement.  Note, we injected 20 mL of 0.5% Marcaine plain into the soft tissue areas.  After the cement was hardened, we then removed all loose pieces of cement, irrigated the  knee, and then inserted our permanent implant.  We utilized a size 3, 15 mm thickness rotating platform, reduced the knee, and had excellent motion and excellent function.  Hemovac drain was inserted.  We then closed the knee in layers in the usual fashion.  We injected 20 mL of Exparel with 20 mL of normal saline in the soft tissue before the final skin was closed.  Sterile dressings were applied.          ______________________________ Georges Lynch Darrelyn Hillock, M.D.     RAG/MEDQ  D:  10/13/2015  T:  10/13/2015  Job:  [id number]

## 2015-10-14 NOTE — Progress Notes (Signed)
Physical Therapy Treatment Patient Details Name: Andrea Robertson MRN: [id number] DOB: Jul 06, 1961 Today's Date: 11/08/2015    History of Present Illness R TKA    PT Comments    Pt progressing well with mobility and hopeful for dc tomorrow.  Follow Up Recommendations  Home health PT     Equipment Recommendations  Rolling walker with 5" wheels    Recommendations for Other Services OT consult     Precautions / Restrictions Precautions Precautions: Fall;Knee Required Braces or Orthoses: Knee Immobilizer - Right Knee Immobilizer - Right: Discontinue once straight leg raise with < 10 degree lag Restrictions Weight Bearing Restrictions: No RLE Weight Bearing: Weight bearing as tolerated    Mobility  Bed Mobility Overal bed mobility: Needs Assistance Bed Mobility: Sit to Supine       Sit to supine: Min assist   General bed mobility comments: cues for sequence and use of L LE to self assist  Transfers Overall transfer level: Needs assistance Equipment used: Rolling walker (2 wheeled) Transfers: Sit to/from Stand Sit to Stand: Min guard         General transfer comment: cues for LE management and use of UEs to self assist  Ambulation/Gait Ambulation/Gait assistance: Min assist;Min guard Ambulation Distance (Feet): 125 Feet Assistive device: Rolling walker (2 wheeled) Gait Pattern/deviations: Step-to pattern;Step-through pattern;Decreased step length - right;Decreased step length - left;Shuffle;Trunk flexed Gait velocity: decr   General Gait Details: cues for sequence, posture and position from [employer]            Wheelchair Mobility    Modified Rankin (Stroke Patients Only)       Balance                                    Cognition Arousal/Alertness: Awake/alert Behavior During Therapy: WFL for tasks assessed/performed Overall Cognitive Status: Within Functional Limits for tasks assessed                       Exercises      General Comments        Pertinent Vitals/Pain Pain Assessment: 0-10 Pain Score: 3  Pain Location: R knee Pain Descriptors / Indicators: Aching;Sore Pain Intervention(s): Limited activity within patient's tolerance;Monitored during session;Premedicated before session;Ice applied    Home Living                      Prior Function            PT Goals (current goals can now be found in the care plan section) Acute Rehab PT Goals Patient Stated Goal: go home soon PT Goal Formulation: With patient Potential to Achieve Goals: Good Progress towards PT goals: Progressing toward goals    Frequency  7X/week    PT Plan Current plan remains appropriate    Co-evaluation             End of Session Equipment Utilized During Treatment: Gait belt Activity Tolerance: Patient tolerated treatment well Patient left: in bed;with call bell/phone within reach;with family/visitor present     Time: 1446-1510 PT Time Calculation (min) (ACUTE ONLY): 24 min  Charges:  $Gait Training: 23-37 mins                    G Codes:      Andrea Robertson 11-08-2015, 5:36 PM

## 2015-10-14 NOTE — Progress Notes (Signed)
Subjective: 1 Day Post-Op Procedure(s) (LRB): RIGHT TOTAL KNEE ARTHROPLASTY (Right) Patient reports pain as 2 on 0-10 scale. Doing very well. Hemovac DCd. Plan on DC tomorrow.   Objective: Vital signs in last 24 hours: Temp:  [97.6 F (36.4 C)-99 F (37.2 C)] 99 F (37.2 C) (02/22 0645) Pulse Rate:  [94-110] 94 (02/22 0645) Resp:  [16-24] 16 (02/22 0645) BP: (121-154)/(72-90) 129/74 mmHg (02/22 0645) SpO2:  [93 %-100 %] 95 % (02/22 0645) Weight:  [84.369 kg (186 lb)] 84.369 kg (186 lb) (02/21 1759)  Intake/Output from previous day: 02/21 0701 - 02/22 0700 In: 3874.8 [P.O.:720; I.V.:2899.8; IV Piggyback:255] Out: 2005 [Urine:1750; Drains:255] Intake/Output this shift: Total I/O In: 240 [P.O.:240] Out: 200 [Urine:200]   Recent Labs  10/14/15 0427  HGB 10.9*    Recent Labs  10/14/15 0427  WBC 19.1*  RBC 3.87  HCT 32.7*  PLT 314    Recent Labs  10/13/15 1458 10/14/15 0427  NA 135 135  K 3.5 3.8  CL 99* 100*  CO2 25 26  BUN 12 11  CREATININE 1.06* 0.97  GLUCOSE 198* 144*  CALCIUM 8.4* 8.5*   No results for input(s): LABPT, INR in the last 72 hours.  No cellulitis present Compartment soft  Assessment/Plan: 1 Day Post-Op Procedure(s) (LRB): RIGHT TOTAL KNEE ARTHROPLASTY (Right) Up with therapy discontinued tomorrow.  Dominiq Fontaine A 10/14/2015, 9:42 AM

## 2015-10-14 NOTE — Evaluation (Signed)
Occupational Therapy Evaluation AND Discharge  Patient Details Name: Andrea Robertson MRN: [id number] DOB: Mar 27, 1961 Today's Date: 10/14/2015    History of Present Illness R TKA   Clinical Impression   Patient admitted with above. Patient independent PTA. Patient currently functioning at an overall supervision level, requiring min assist for LB ADLs.  No additional OT needs identified, D/C from acute OT services and no additional follow-up OT needs at this time. All appropriate education provided to patient. Please re-order OT if needed.      Follow Up Recommendations  No OT follow up;Supervision - Intermittent    Equipment Recommendations  3 in 1 bedside comode    Recommendations for Other Services  None at this time   Precautions / Restrictions Precautions Precautions: Fall;Knee Precaution Comments: reviewed knee precautions  Required Braces or Orthoses: Knee Immobilizer - Right Knee Immobilizer - Right: Discontinue once straight leg raise with < 10 degree lag Restrictions Weight Bearing Restrictions: Yes RLE Weight Bearing: Weight bearing as tolerated    Mobility Bed Mobility General bed mobility comments: Pt received seated in recliner upon OT entering/exiting room   Transfers Overall transfer level: Needs assistance Equipment used: Rolling walker (2 wheeled) Transfers: Sit to/from Stand Sit to Stand: Supervision General transfer comment: Supervision for safety, cues for hand placement and RLE management     Balance Overall balance assessment: Needs assistance Sitting-balance support: No upper extremity supported;Feet supported Sitting balance-Leahy Scale: Good     Standing balance support: Bilateral upper extremity supported;During functional activity Standing balance-Leahy Scale: Good    ADL Overall ADL's : Needs assistance/impaired General ADL Comments: Pt requires up to min assist with LB ADLs, but is overall supervision other than that. Educated pt on  correct and safest way to perform tub/shower transfer using 3-n-1 and administerred handout regarding this. Pt supervision for functional ambulation using RW with minimal verbal cueing needed for hand placement and RLE management. Pt states her mother will be able to assist prn post acute d/c.     Pertinent Vitals/Pain Pain Assessment: 0-10 Pain Score: 7  Pain Location: RLE  Pain Descriptors / Indicators: Aching;Sore Pain Intervention(s): Limited activity within patient's tolerance;Monitored during session;Repositioned;RN gave pain meds during session     Hand Dominance Right   Extremity/Trunk Assessment Upper Extremity Assessment Upper Extremity Assessment: Overall WFL for tasks assessed   Lower Extremity Assessment Lower Extremity Assessment: Defer to PT evaluation   Cervical / Trunk Assessment Cervical / Trunk Assessment: Normal   Communication Communication Communication: No difficulties   Cognition Arousal/Alertness: Awake/alert Behavior During Therapy: WFL for tasks assessed/performed Overall Cognitive Status: Within Functional Limits for tasks assessed              Home Living Family/patient expects to be discharged to:: Private residence Living Arrangements: Parent Available Help at Discharge: Family;Available 24 hours/day Type of Home: Apartment (condo) Home Access: Level entry     Home Layout: One level     Bathroom Shower/Tub: IT trainer: Standard     Home Equipment: None   Additional Comments: Pt states she plans to discharge to her mother's one story condo for a couple of weeks prior to going home to her 2 story house       Prior Functioning/Environment Level of Independence: Independent     OT Diagnosis: Generalized weakness;Acute pain   OT Problem List:   N/a, no acute OT needs identified at this time     OT Treatment/Interventions:   N/a, no acute OT  needs identified at this time     OT Goals(Current goals  can be found in the care plan section) Acute Rehab OT Goals Patient Stated Goal: go home soon OT Goal Formulation: All assessment and education complete, DC therapy  OT Frequency:   N/a, no acute OT needs identified at this time     Barriers to D/C:  none known at this time    End of Session Equipment Utilized During Treatment: Rolling walker;Right knee immobilizer Nurse Communication: Mobility status;Other (comment) (recommendation for patient to ambulate to/from BR with staff)  Activity Tolerance: Patient tolerated treatment well Patient left: in chair;with call bell/phone within reach   Time: 1610-9604 OT Time Calculation (min): 28 min Charges:  OT General Charges $OT Visit: 1 Procedure OT Evaluation $OT Eval Low Complexity: 1 Procedure OT Treatments $Self Care/Home Management : 8-22 mins  Edwin Cap , MS, OTR/L, CLT Pager: [phone]  10/14/2015, 9:55 AM

## 2015-10-14 NOTE — Care Management Note (Signed)
Case Management Note  Patient Details  Name: MARIEA MCMARTIN MRN: [id number] Date of Birth: 09/07/1960  Subjective/Objective:                  RIGHT TOTAL KNEE ARTHROPLASTY (Right) Action/Plan: Discharge planning Expected Discharge Date:  10/15/15               Expected Discharge Plan:  [place]  In-House Referral:     Discharge planning Services  CM Consult  Post Acute Care Choice:  Home Health Choice offered to:  Patient  DME Arranged:  3-N-1, Walker rolling DME Agency:  [place]:  PT [place] Agency:  [place]  Status of Service:  Completed, signed off  Medicare Important Message Given:    Date Medicare IM Given:    Medicare IM give by:    Date Additional Medicare IM Given:    Additional Medicare Important Message give by:     If discussed at [place] of Stay Meetings, dates discussed:    Additional Comments: CM met with pt in room to offer choice of home health agency.  Pt chooses [facility] to render HHPT.  Pt will be staying with her mother at [address], [place], [place] [address] and can be reached at [phone].  Referral called to [employer], Tim.  CM called [facility] DME rep, Merry Proud to please deliver the 3n1 and rolling walker to room prior to discharge.  No other CM needs were communicated. Dellie Catholic, RN 10/14/2015, 12:50 PM

## 2015-10-15 LAB — CBC
HEMATOCRIT: 31.5 % — AB (ref 36.0–46.0)
HEMOGLOBIN: 10.5 g/dL — AB (ref 12.0–15.0)
MCH: 28.3 pg (ref 26.0–34.0)
MCHC: 33.3 g/dL (ref 30.0–36.0)
MCV: 84.9 fL (ref 78.0–100.0)
Platelets: 298 10*3/uL (ref 150–400)
RBC: 3.71 MIL/uL — ABNORMAL LOW (ref 3.87–5.11)
RDW: 15.2 % (ref 11.5–15.5)
WBC: 16.8 10*3/uL — AB (ref 4.0–10.5)

## 2015-10-15 LAB — BASIC METABOLIC PANEL
ANION GAP: 7 (ref 5–15)
BUN: 14 mg/dL (ref 6–20)
CALCIUM: 8.7 mg/dL — AB (ref 8.9–10.3)
CHLORIDE: 99 mmol/L — AB (ref 101–111)
CO2: 32 mmol/L (ref 22–32)
Creatinine, Ser: 1.1 mg/dL — ABNORMAL HIGH (ref 0.44–1.00)
GFR calc non Af Amer: 57 mL/min — ABNORMAL LOW (ref >60)
GLUCOSE: 135 mg/dL — AB (ref 65–99)
POTASSIUM: 3.4 mmol/L — AB (ref 3.5–5.1)
Sodium: 138 mmol/L (ref 135–145)

## 2015-10-15 MED ORDER — METHOCARBAMOL 500 MG PO TABS: 500.0000 mg | ORAL_TABLET | Freq: Four times a day (QID) | ORAL | Status: DC | PRN

## 2015-10-15 MED ORDER — ASPIRIN EC 325 MG PO TBEC: 325.0000 mg | DELAYED_RELEASE_TABLET | Freq: Two times a day (BID) | ORAL | Status: DC

## 2015-10-15 MED ORDER — HYDROMORPHONE HCL 2 MG PO TABS: 2.0000 mg | ORAL_TABLET | ORAL | Status: DC | PRN

## 2015-10-15 NOTE — Progress Notes (Signed)
Physical Therapy Treatment Patient Details Name: Andrea Robertson MRN: [id number] DOB: 12-Jan-1961 Today's Date: 10/15/2015    History of Present Illness R TKA    PT Comments    Pt progressing steadily with mobility.  Reviewed therex and stairs.  Follow Up Recommendations  Home health PT     Equipment Recommendations  Rolling walker with 5" wheels    Recommendations for Other Services OT consult     Precautions / Restrictions Precautions Precautions: Fall;Knee Precaution Comments: reviewed knee precautions  Restrictions Weight Bearing Restrictions: No RLE Weight Bearing: Weight bearing as tolerated    Mobility  Bed Mobility               General bed mobility comments: NT - OOB with nursing and declines back to bed  Transfers Overall transfer level: Needs assistance Equipment used: Rolling walker (2 wheeled) Transfers: Sit to/from Stand Sit to Stand: Min guard;Supervision         General transfer comment: min cues for LE management and use of UEs to self assist  Ambulation/Gait Ambulation/Gait assistance: Min guard;Supervision Ambulation Distance (Feet): 80 Feet Assistive device: Rolling walker (2 wheeled) Gait Pattern/deviations: Step-to pattern;Decreased step length - right;Decreased step length - left;Shuffle;Trunk flexed Gait velocity: decr   General Gait Details: min cues for sequence, posture and position from RW   Stairs Stairs: Yes Stairs assistance: Min assist Stair Management: No rails;Backwards;With walker;Step to pattern Number of Stairs: 2 General stair comments: single step x3 bkwd with RW and cues for foot/RW placement  Wheelchair Mobility    Modified Rankin (Stroke Patients Only)       Balance                                    Cognition Arousal/Alertness: Awake/alert Behavior During Therapy: WFL for tasks assessed/performed Overall Cognitive Status: Within Functional Limits for tasks assessed                       Exercises Total Joint Exercises Ankle Circles/Pumps: AROM;Both;15 reps;Supine Quad Sets: AROM;Both;Supine;15 reps Heel Slides: AAROM;Right;Supine;20 reps Straight Leg Raises: AAROM;Right;Supine;15 reps Long Arc Quad: AAROM;Right;10 reps;Seated Goniometric ROM: AAROM R knee -10-  45    General Comments        Pertinent Vitals/Pain Pain Assessment: 0-10 Pain Score: 4  Pain Location: R knee Pain Descriptors / Indicators: Aching;Sore Pain Intervention(s): Limited activity within patient's tolerance;Monitored during session;Premedicated before session;Ice applied    Home Living                      Prior Function            PT Goals (current goals can now be found in the care plan section) Acute Rehab PT Goals Patient Stated Goal: go home soon PT Goal Formulation: With patient Potential to Achieve Goals: Good Progress towards PT goals: Progressing toward goals    Frequency  7X/week    PT Plan Current plan remains appropriate    Co-evaluation             End of Session Equipment Utilized During Treatment: Gait belt Activity Tolerance: Patient tolerated treatment well Patient left: in chair;with call bell/phone within reach     Time: 1035-1103 PT Time Calculation (min) (ACUTE ONLY): 28 min  Charges:  $Gait Training: 8-22 mins $Therapeutic Exercise: 8-22 mins  G Codes:      Marquelle Balow Oct 20, 2015, 12:41 PM

## 2015-10-15 NOTE — Discharge Instructions (Signed)

## 2015-10-15 NOTE — Progress Notes (Signed)
CM received notification from PT rolling walker needs to be exchanged for a youth rolling walker.  CM called [facility] DME rep, Lecretia to please swap out walker for a youth walker.  No other CM needs were communicated.

## 2015-10-15 NOTE — Progress Notes (Signed)
Subjective: 2 Days Post-Op Procedure(s) (LRB): RIGHT TOTAL KNEE ARTHROPLASTY (Right) Patient reports pain as 2 on 0-10 scale. Doing very well today. Ready for DC home.   Objective: Vital signs in last 24 hours: Temp:  [98 F (36.7 C)-98.8 F (37.1 C)] 98 F (36.7 C) (02/23 3151) Pulse Rate:  [87-101] 92 (02/23 0613) Resp:  [15-16] 15 (02/23 0613) BP: (121-140)/(69-82) 121/69 mmHg (02/23 0613) SpO2:  [95 %-99 %] 99 % (02/23 7616)  Intake/Output from previous day: 02/22 0701 - 02/23 0700 In: 2024.5 [P.O.:1320; I.V.:704.5] Out: 1650 [Urine:1650] Intake/Output this shift:     Recent Labs  10/14/15 0427 10/15/15 0421  HGB 10.9* 10.5*    Recent Labs  10/14/15 0427 10/15/15 0421  WBC 19.1* 16.8*  RBC 3.87 3.71*  HCT 32.7* 31.5*  PLT 314 298    Recent Labs  10/14/15 0427 10/15/15 0421  NA 135 138  K 3.8 3.4*  CL 100* 99*  CO2 26 32  BUN 11 14  CREATININE 0.97 1.10*  GLUCOSE 144* 135*  CALCIUM 8.5* 8.7*   No results for input(s): LABPT, INR in the last 72 hours.  Neurovascular intact Dorsiflexion/Plantar flexion intact  Assessment/Plan: 2 Days Post-Op Procedure(s) (LRB): RIGHT TOTAL KNEE ARTHROPLASTY (Right) Up with therapy Discharge home with home health  Andrea Robertson A 10/15/2015, 7:23 AM

## 2015-10-16 NOTE — Discharge Summary (Signed)
Physician Discharge Summary   Patient ID: Andrea Robertson MRN: [id number] DOB/AGE: [birth year] 54 y.o.  Admit date: 10/13/2015 Discharge date: 10/15/2015  Primary Diagnosis: Primary osteoarthritis right knee   Admission Diagnoses:  Past Medical History  Diagnosis Date  . Hypertension   . Depression   . PVC's (premature ventricular contractions)   . GERD (gastroesophageal reflux disease)   . Headache     sinus headaches  . Arthritis    Discharge Diagnoses:   Active Problems:   History of total knee arthroplasty  Estimated body mass index is 32.96 kg/(m^2) as calculated from the following:   Height as of this encounter: '5\' 3"'  (1.6 m).   Weight as of this encounter: 84.369 kg (186 lb).  Procedure:  Procedure(s) (LRB): RIGHT TOTAL KNEE ARTHROPLASTY (Right)   Consults: None  HPI: Andrea Robertson, 55 y.o. female, has a history of pain and functional disability in the right knee due to arthritis and has failed non-surgical conservative treatments for greater than 12 weeks to includeNSAID's and/or analgesics, corticosteriod injections, flexibility and strengthening excercises and activity modification. Onset of symptoms was gradual, starting >10 years ago with gradually worsening course since that time. The patient noted no past surgery on the right knee(s). Patient currently rates pain in the right knee(s) at 7 out of 10 with activity. Patient has night pain, worsening of pain with activity and weight bearing, pain that interferes with activities of daily living, pain with passive range of motion, crepitus and joint swelling. Patient has evidence of periarticular osteophytes and joint space narrowing by imaging studies. There is no active infection.  Laboratory Data: Admission on 10/13/2015, Discharged on 10/15/2015  Component Date Value Ref Range Status  . Sodium 10/13/2015 135  135 - 145 mmol/L Final  . Potassium 10/13/2015 3.5  3.5 - 5.1 mmol/L Final  . Chloride 10/13/2015  99* 101 - 111 mmol/L Final  . CO2 10/13/2015 25  22 - 32 mmol/L Final  . Glucose, Bld 10/13/2015 198* 65 - 99 mg/dL Final  . BUN 10/13/2015 12  6 - 20 mg/dL Final  . Creatinine, Ser 10/13/2015 1.06* 0.44 - 1.00 mg/dL Final  . Calcium 10/13/2015 8.4* 8.9 - 10.3 mg/dL Final  . GFR calc non Af Amer 10/13/2015 59* >60 mL/min Final  . GFR calc Af Amer 10/13/2015 >60  >60 mL/min Final   Comment: (NOTE) The eGFR has been calculated using the CKD EPI equation. This calculation has not been validated in all clinical situations. eGFR's persistently <60 mL/min signify possible Chronic Kidney Disease.   . Anion gap 10/13/2015 11  5 - 15 Final  . WBC 10/14/2015 19.1* 4.0 - 10.5 K/uL Final  . RBC 10/14/2015 3.87  3.87 - 5.11 MIL/uL Final  . Hemoglobin 10/14/2015 10.9* 12.0 - 15.0 g/dL Final  . HCT 10/14/2015 32.7* 36.0 - 46.0 % Final  . MCV 10/14/2015 84.5  78.0 - 100.0 fL Final  . MCH 10/14/2015 28.2  26.0 - 34.0 pg Final  . MCHC 10/14/2015 33.3  30.0 - 36.0 g/dL Final  . RDW 10/14/2015 14.9  11.5 - 15.5 % Final  . Platelets 10/14/2015 314  150 - 400 K/uL Final  . Sodium 10/14/2015 135  135 - 145 mmol/L Final  . Potassium 10/14/2015 3.8  3.5 - 5.1 mmol/L Final  . Chloride 10/14/2015 100* 101 - 111 mmol/L Final  . CO2 10/14/2015 26  22 - 32 mmol/L Final  . Glucose, Bld 10/14/2015 144* 65 - 99 mg/dL Final  .  BUN 10/14/2015 11  6 - 20 mg/dL Final  . Creatinine, Ser 10/14/2015 0.97  0.44 - 1.00 mg/dL Final  . Calcium 10/14/2015 8.5* 8.9 - 10.3 mg/dL Final  . GFR calc non Af Amer 10/14/2015 >60  >60 mL/min Final  . GFR calc Af Amer 10/14/2015 >60  >60 mL/min Final   Comment: (NOTE) The eGFR has been calculated using the CKD EPI equation. This calculation has not been validated in all clinical situations. eGFR's persistently <60 mL/min signify possible Chronic Kidney Disease.   . Anion gap 10/14/2015 9  5 - 15 Final  . WBC 10/15/2015 16.8* 4.0 - 10.5 K/uL Final  . RBC 10/15/2015 3.71* 3.87 -  5.11 MIL/uL Final  . Hemoglobin 10/15/2015 10.5* 12.0 - 15.0 g/dL Final  . HCT 10/15/2015 31.5* 36.0 - 46.0 % Final  . MCV 10/15/2015 84.9  78.0 - 100.0 fL Final  . MCH 10/15/2015 28.3  26.0 - 34.0 pg Final  . MCHC 10/15/2015 33.3  30.0 - 36.0 g/dL Final  . RDW 10/15/2015 15.2  11.5 - 15.5 % Final  . Platelets 10/15/2015 298  150 - 400 K/uL Final  . Sodium 10/15/2015 138  135 - 145 mmol/L Final  . Potassium 10/15/2015 3.4* 3.5 - 5.1 mmol/L Final  . Chloride 10/15/2015 99* 101 - 111 mmol/L Final  . CO2 10/15/2015 32  22 - 32 mmol/L Final  . Glucose, Bld 10/15/2015 135* 65 - 99 mg/dL Final  . BUN 10/15/2015 14  6 - 20 mg/dL Final  . Creatinine, Ser 10/15/2015 1.10* 0.44 - 1.00 mg/dL Final  . Calcium 10/15/2015 8.7* 8.9 - 10.3 mg/dL Final  . GFR calc non Af Amer 10/15/2015 57* >60 mL/min Final  . GFR calc Af Amer 10/15/2015 >60  >60 mL/min Final   Comment: (NOTE) The eGFR has been calculated using the CKD EPI equation. This calculation has not been validated in all clinical situations. eGFR's persistently <60 mL/min signify possible Chronic Kidney Disease.   Georgiann Hahn gap 10/15/2015 7  5 - 15 Final  Hospital Outpatient Visit on 10/05/2015  Component Date Value Ref Range Status  . MRSA, PCR 10/05/2015 NEGATIVE  NEGATIVE Final  . Staphylococcus aureus 10/05/2015 NEGATIVE  NEGATIVE Final   Comment:        The Xpert SA Assay (FDA approved for NASAL specimens in patients over 27 years of age), is one component of a comprehensive surveillance program.  Test performance has been validated by [facility] for patients greater than or equal to 90 year old. It is not intended to diagnose infection nor to guide or monitor treatment.   Marland Kitchen aPTT 10/05/2015 31  24 - 37 seconds Final  . WBC 10/05/2015 16.8* 4.0 - 10.5 K/uL Final  . RBC 10/05/2015 4.24  3.87 - 5.11 MIL/uL Final  . Hemoglobin 10/05/2015 11.9* 12.0 - 15.0 g/dL Final  . HCT 10/05/2015 35.5* 36.0 - 46.0 % Final  . MCV 10/05/2015  83.7  78.0 - 100.0 fL Final  . MCH 10/05/2015 28.1  26.0 - 34.0 pg Final  . MCHC 10/05/2015 33.5  30.0 - 36.0 g/dL Final  . RDW 10/05/2015 14.7  11.5 - 15.5 % Final  . Platelets 10/05/2015 338  150 - 400 K/uL Final  . Neutrophils Relative % 10/05/2015 65   Final  . Neutro Abs 10/05/2015 11.0* 1.7 - 7.7 K/uL Final  . Lymphocytes Relative 10/05/2015 27   Final  . Lymphs Abs 10/05/2015 4.5* 0.7 - 4.0 K/uL Final  .  Monocytes Relative 10/05/2015 7   Final  . Monocytes Absolute 10/05/2015 1.1* 0.1 - 1.0 K/uL Final  . Eosinophils Relative 10/05/2015 1   Final  . Eosinophils Absolute 10/05/2015 0.2  0.0 - 0.7 K/uL Final  . Basophils Relative 10/05/2015 0   Final  . Basophils Absolute 10/05/2015 0.0  0.0 - 0.1 K/uL Final  . Sodium 10/05/2015 134* 135 - 145 mmol/L Final  . Potassium 10/05/2015 3.2* 3.5 - 5.1 mmol/L Final  . Chloride 10/05/2015 98* 101 - 111 mmol/L Final  . CO2 10/05/2015 28  22 - 32 mmol/L Final  . Glucose, Bld 10/05/2015 96  65 - 99 mg/dL Final  . BUN 10/05/2015 13  6 - 20 mg/dL Final  . Creatinine, Ser 10/05/2015 1.05* 0.44 - 1.00 mg/dL Final  . Calcium 10/05/2015 8.6* 8.9 - 10.3 mg/dL Final  . Total Protein 10/05/2015 7.5  6.5 - 8.1 g/dL Final  . Albumin 10/05/2015 3.9  3.5 - 5.0 g/dL Final  . AST 10/05/2015 22  15 - 41 U/L Final  . ALT 10/05/2015 17  14 - 54 U/L Final  . Alkaline Phosphatase 10/05/2015 74  38 - 126 U/L Final  . Total Bilirubin 10/05/2015 0.5  0.3 - 1.2 mg/dL Final  . GFR calc non Af Amer 10/05/2015 60* >60 mL/min Final  . GFR calc Af Amer 10/05/2015 >60  >60 mL/min Final   Comment: (NOTE) The eGFR has been calculated using the CKD EPI equation. This calculation has not been validated in all clinical situations. eGFR's persistently <60 mL/min signify possible Chronic Kidney Disease.   . Anion gap 10/05/2015 8  5 - 15 Final  . Prothrombin Time 10/05/2015 13.3  11.6 - 15.2 seconds Final  . INR 10/05/2015 0.99  0.00 - 1.49 Final  . ABO/RH(D) 10/05/2015  O POS   Final  . Antibody Screen 10/05/2015 NEG   Final  . Sample Expiration 10/05/2015 10/16/2015   Final  . Extend sample reason 10/05/2015 NO TRANSFUSIONS OR PREGNANCY IN THE PAST 3 MONTHS   Final  . Color, Urine 10/05/2015 YELLOW  YELLOW Final  . APPearance 10/05/2015 CLEAR  CLEAR Final  . Specific Gravity, Urine 10/05/2015 1.015  1.005 - 1.030 Final  . pH 10/05/2015 6.0  5.0 - 8.0 Final  . Glucose, UA 10/05/2015 NEGATIVE  NEGATIVE mg/dL Final  . Hgb urine dipstick 10/05/2015 TRACE* NEGATIVE Final  . Bilirubin Urine 10/05/2015 NEGATIVE  NEGATIVE Final  . Ketones, ur 10/05/2015 NEGATIVE  NEGATIVE mg/dL Final  . Protein, ur 10/05/2015 NEGATIVE  NEGATIVE mg/dL Final  . Nitrite 10/05/2015 NEGATIVE  NEGATIVE Final  . Leukocytes, UA 10/05/2015 NEGATIVE  NEGATIVE Final  . ABO/RH(D) 10/05/2015 O POS   Final  . Squamous Epithelial / LPF 10/05/2015 6-30* NONE SEEN Final  . WBC, UA 10/05/2015 0-5  0 - 5 WBC/hpf Final  . RBC / HPF 10/05/2015 0-5  0 - 5 RBC/hpf Final  . Bacteria, UA 10/05/2015 MANY* NONE SEEN Final     X-Rays:No results found.  EKG: Orders placed or performed during the hospital encounter of 03/06/15  . ED EKG within 10 minutes  . ED EKG within 10 minutes  . EKG 12-Lead  . EKG 12-Lead     Hospital Course: Andrea Robertson is a 55 y.o. who was admitted to [facility]. They were brought to the operating room on 10/13/2015 and underwent Procedure(s): RIGHT TOTAL KNEE ARTHROPLASTY.  Patient tolerated the procedure well and was later transferred to the recovery room  and then to the orthopaedic floor for postoperative care.  They were given PO and IV analgesics for pain control following their surgery.  They were given 24 hours of postoperative antibiotics of  Anti-infectives    Start     Dose/Rate Route Frequency Ordered Stop   10/13/15 2359  vancomycin (VANCOCIN) IVPB 1000 mg/200 mL premix     1,000 mg 200 mL/hr over 60 Minutes Intravenous Every 12 hours 10/13/15  1828 10/14/15 0202   10/13/15 1306  polymyxin B 500,000 Units, bacitracin 50,000 Units in sodium chloride irrigation 0.9 % 500 mL irrigation  Status:  Discontinued       As needed 10/13/15 1306 10/13/15 1421   10/13/15 1052  vancomycin (VANCOCIN) IVPB 1000 mg/200 mL premix     1,000 mg 200 mL/hr over 60 Minutes Intravenous On call to O.R. 10/13/15 1052 10/13/15 1245     and started on DVT prophylaxis in the form of Xarelto.   PT and OT were ordered for total joint protocol.  Discharge planning consulted to help with postop disposition and equipment needs.  Patient had a good night on the evening of surgery.  They started to get up OOB with therapy on day one. Hemovac drain was pulled without difficulty.  Continued to work with therapy into day two.  Dressing was changed and the incision was clean and dry.  The patient had progressed with therapy and meeting their goals.  Incision was healing well.  Patient was seen in rounds and was ready to go home.   Diet: Cardiac diet Activity:WBAT Follow-up:in 2 weeks Disposition - Home Discharged Condition: stable   Discharge Instructions    Call MD / Call 911    Complete by:  As directed   If you experience chest pain or shortness of breath, CALL 911 and be transported to the hospital emergency room.  If you develope a fever above 101 F, pus (white drainage) or increased drainage or redness at the wound, or calf pain, call your surgeon's office.     Constipation Prevention    Complete by:  As directed   Drink plenty of fluids.  Prune juice may be helpful.  You may use a stool softener, such as Colace (over the counter) 100 mg twice a day.  Use MiraLax (over the counter) for constipation as needed.     Diet - low sodium heart healthy    Complete by:  As directed      Discharge instructions    Complete by:  As directed   INSTRUCTIONS AFTER JOINT REPLACEMENT   Remove items at home which could result in a fall. This includes throw rugs or furniture in  walking pathways ICE to the affected joint every three hours while awake for 30 minutes at a time, for at least the first 3-5 days, and then as needed for pain and swelling.  Continue to use ice for pain and swelling. You may notice swelling that will progress down to the foot and ankle.  This is normal after surgery.  Elevate your leg when you are not up walking on it.   Continue to use the breathing machine you got in the hospital (incentive spirometer) which will help keep your temperature down.  It is common for your temperature to cycle up and down following surgery, especially at night when you are not up moving around and exerting yourself.  The breathing machine keeps your lungs expanded and your temperature down.   DIET:  As you were  doing prior to hospitalization, we recommend a well-balanced diet.  DRESSING / WOUND CARE / SHOWERING  You may change your dressing every day with sterile gauze.  Please use good hand washing techniques before changing the dressing.  Do not use any lotions or creams on the incision until instructed by your surgeon.  ACTIVITY  Increase activity slowly as tolerated, but follow the weight bearing instructions below.   No driving for 6 weeks or until further direction given by your physician.  You cannot drive while taking narcotics.  No lifting or carrying greater than 10 lbs. until further directed by your surgeon. Avoid periods of inactivity such as sitting longer than an hour when not asleep. This helps prevent blood clots.  You may return to work once you are authorized by your doctor.     WEIGHT BEARING   Weight bearing as tolerated with assist device (walker, cane, etc) as directed, use it as long as suggested by your surgeon or therapist, typically at least 4-6 weeks.   EXERCISES  Results after joint replacement surgery are often greatly improved when you follow the exercise, range of motion and muscle strengthening exercises prescribed by your  doctor. Safety measures are also important to protect the joint from further injury. Any time any of these exercises cause you to have increased pain or swelling, decrease what you are doing until you are comfortable again and then slowly increase them. If you have problems or questions, call your caregiver or physical therapist for advice.   Rehabilitation is important following a joint replacement. After just a few days of immobilization, the muscles of the leg can become weakened and shrink (atrophy).  These exercises are designed to build up the tone and strength of the thigh and leg muscles and to improve motion. Often times heat used for twenty to thirty minutes before working out will loosen up your tissues and help with improving the range of motion but do not use heat for the first two weeks following surgery (sometimes heat can increase post-operative swelling).   These exercises can be done on a training (exercise) mat, on the floor, on a table or on a bed. Use whatever works the best and is most comfortable for you.    Use music or television while you are exercising so that the exercises are a pleasant break in your day. This will make your life better with the exercises acting as a break in your routine that you can look forward to.   Perform all exercises about fifteen times, three times per day or as directed.  You should exercise both the operative leg and the other leg as well.  Exercises include:   Quad Sets - Tighten up the muscle on the front of the thigh (Quad) and hold for 5-10 seconds.   Straight Leg Raises - With your knee straight (if you were given a brace, keep it on), lift the leg to 60 degrees, hold for 3 seconds, and slowly lower the leg.  Perform this exercise against resistance later as your leg gets stronger.  Leg Slides: Lying on your back, slowly slide your foot toward your buttocks, bending your knee up off the floor (only go as far as is comfortable). Then slowly slide  your foot back down until your leg is flat on the floor again.  Angel Wings: Lying on your back spread your legs to the side as far apart as you can without causing discomfort.  Hamstring Strength:  Lying on  your back, push your heel against the floor with your leg straight by tightening up the muscles of your buttocks.  Repeat, but this time bend your knee to a comfortable angle, and push your heel against the floor.  You may put a pillow under the heel to make it more comfortable if necessary.   A rehabilitation program following joint replacement surgery can speed recovery and prevent re-injury in the future due to weakened muscles. Contact your doctor or a physical therapist for more information on knee rehabilitation.    CONSTIPATION  Constipation is defined medically as fewer than three stools per week and severe constipation as less than one stool per week.  Even if you have a regular bowel pattern at home, your normal regimen is likely to be disrupted due to multiple reasons following surgery.  Combination of anesthesia, postoperative narcotics, change in appetite and fluid intake all can affect your bowels.   YOU MUST use at least one of the following options; they are listed in order of increasing strength to get the job done.  They are all available over the counter, and you may need to use some, POSSIBLY even all of these options:    Drink plenty of fluids (prune juice may be helpful) and high fiber foods Colace 100 mg by mouth twice a day  Senokot for constipation as directed and as needed Dulcolax (bisacodyl), take with full glass of water  Miralax (polyethylene glycol) once or twice a day as needed.  If you have tried all these things and are unable to have a bowel movement in the first 3-4 days after surgery call either your surgeon or your primary doctor.    If you experience loose stools or diarrhea, hold the medications until you stool forms back up.  If your symptoms do not  get better within 1 week or if they get worse, check with your doctor.  If you experience "the worst abdominal pain ever" or develop nausea or vomiting, please contact the office immediately for further recommendations for treatment.   ITCHING:  If you experience itching with your medications, try taking only a single pain pill, or even half a pain pill at a time.  You can also use Benadryl over the counter for itching or also to help with sleep.   TED HOSE STOCKINGS:  Use stockings on both legs until for at least 2 weeks or as directed by physician office. They may be removed at night for sleeping.  MEDICATIONS:  See your medication summary on the "After Visit Summary" that nursing will review with you.  You may have some home medications which will be placed on hold until you complete the course of blood thinner medication.  It is important for you to complete the blood thinner medication as prescribed.  PRECAUTIONS:  If you experience chest pain or shortness of breath - call 911 immediately for transfer to the hospital emergency department.   If you develop a fever greater that 101 F, purulent drainage from wound, increased redness or drainage from wound, foul odor from the wound/dressing, or calf pain - CONTACT YOUR SURGEON.                                                   FOLLOW-UP APPOINTMENTS:  If you do not already have a post-op appointment, please call  the office for an appointment to be seen by your surgeon.  Guidelines for how soon to be seen are listed in your "After Visit Summary", but are typically between 1-4 weeks after surgery.    MAKE SURE YOU:  Understand these instructions.  Get help right away if you are not doing well or get worse.    Thank you for letting us be a part of your medical care team.  It is a privilege we respect greatly.  We hope these instructions will help you stay on track for a fast and full recovery!     Increase activity slowly as tolerated     Complete by:  As directed             Medication List    TAKE these medications        amLODipine 10 MG tablet  Commonly known as:  NORVASC  Take 10 mg by mouth daily.     aspirin EC 325 MG tablet  Take 1 tablet (325 mg total) by mouth 2 (two) times daily. To prevent blood clots     buPROPion 300 MG 24 hr tablet  Commonly known as:  WELLBUTRIN XL  Take 300 mg by mouth daily.     FLUoxetine 20 MG capsule  Commonly known as:  PROZAC  Take 20 mg by mouth daily.     hydrochlorothiazide 25 MG tablet  Commonly known as:  HYDRODIURIL  Take 25 mg by mouth every morning.     HYDROmorphone 2 MG tablet  Commonly known as:  DILAUDID  Take 1 tablet (2 mg total) by mouth every 3 (three) hours as needed for moderate pain.     methocarbamol 500 MG tablet  Commonly known as:  ROBAXIN  Take 1 tablet (500 mg total) by mouth every 6 (six) hours as needed for muscle spasms.     multivitamin with minerals Tabs tablet  Take 1 tablet by mouth daily.     potassium chloride SA 20 MEQ tablet  Commonly known as:  K-DUR,KLOR-CON  Take 1 tablet (20 mEq total) by mouth daily.     valsartan 320 MG tablet  Commonly known as:  DIOVAN  Take 320 mg by mouth every morning.           Follow-up Information    Follow up with [facility].   Why:  home health physical therapy    Contact information:   [address] [place] [address] [phone]       Follow up with [facility].   Why:  rolling walker and 3n1   Contact information:   [place] [id number] [phone]       Follow up with GIOFFRE,RONALD A, MD. Schedule an appointment as soon as possible for a visit in 2 weeks.   Specialty:  Orthopedic Surgery   Contact information:   [address] [place] [address] [phone]       Signed: Ardeen Jourdain, PA-C Orthopaedic Surgery 10/16/2015, 9:15 AM

## 2015-11-19 ENCOUNTER — Encounter (HOSPITAL_COMMUNITY): Payer: Self-pay | Admitting: Orthopedic Surgery

## 2015-11-28 ENCOUNTER — Emergency Department (HOSPITAL_COMMUNITY): Payer: BLUE CROSS/BLUE SHIELD

## 2015-11-28 ENCOUNTER — Emergency Department (HOSPITAL_COMMUNITY)
Admission: EM | Admit: 2015-11-28 | Discharge: 2015-11-28 | Disposition: A | Discharge status: Home / Self Care | Payer: BLUE CROSS/BLUE SHIELD | Attending: Emergency Medicine | Admitting: Emergency Medicine

## 2015-11-28 ENCOUNTER — Encounter (HOSPITAL_COMMUNITY): Payer: Self-pay | Admitting: Emergency Medicine

## 2015-11-28 DIAGNOSIS — Z9049 Acquired absence of other specified parts of digestive tract: Secondary | ICD-10-CM | POA: Insufficient documentation

## 2015-11-28 DIAGNOSIS — Z79899 Other long term (current) drug therapy: Secondary | ICD-10-CM | POA: Insufficient documentation

## 2015-11-28 DIAGNOSIS — M1990 Unspecified osteoarthritis, unspecified site: Secondary | ICD-10-CM | POA: Diagnosis not present

## 2015-11-28 DIAGNOSIS — F329 Major depressive disorder, single episode, unspecified: Secondary | ICD-10-CM | POA: Insufficient documentation

## 2015-11-28 DIAGNOSIS — I10 Essential (primary) hypertension: Secondary | ICD-10-CM | POA: Diagnosis not present

## 2015-11-28 DIAGNOSIS — R1013 Epigastric pain: Secondary | ICD-10-CM | POA: Diagnosis not present

## 2015-11-28 DIAGNOSIS — K219 Gastro-esophageal reflux disease without esophagitis: Secondary | ICD-10-CM | POA: Insufficient documentation

## 2015-11-28 DIAGNOSIS — Z7982 Long term (current) use of aspirin: Secondary | ICD-10-CM | POA: Insufficient documentation

## 2015-11-28 DIAGNOSIS — Z880 Allergy status to penicillin: Secondary | ICD-10-CM | POA: Insufficient documentation

## 2015-11-28 DIAGNOSIS — Z90710 Acquired absence of both cervix and uterus: Secondary | ICD-10-CM | POA: Insufficient documentation

## 2015-11-28 DIAGNOSIS — R112 Nausea with vomiting, unspecified: Secondary | ICD-10-CM | POA: Diagnosis not present

## 2015-11-28 DIAGNOSIS — M25461 Effusion, right knee: Secondary | ICD-10-CM | POA: Diagnosis not present

## 2015-11-28 LAB — CBC WITH DIFFERENTIAL/PLATELET
Basophils Absolute: 0 10*3/uL (ref 0.0–0.1)
Basophils Relative: 0 %
EOS PCT: 1 %
Eosinophils Absolute: 0.1 10*3/uL (ref 0.0–0.7)
HCT: 35.3 % — ABNORMAL LOW (ref 36.0–46.0)
Hemoglobin: 12 g/dL (ref 12.0–15.0)
LYMPHS ABS: 2.5 10*3/uL (ref 0.7–4.0)
LYMPHS PCT: 19 %
MCH: 27.1 pg (ref 26.0–34.0)
MCHC: 34 g/dL (ref 30.0–36.0)
MCV: 79.7 fL (ref 78.0–100.0)
MONO ABS: 0.8 10*3/uL (ref 0.1–1.0)
MONOS PCT: 6 %
Neutro Abs: 9.9 10*3/uL — ABNORMAL HIGH (ref 1.7–7.7)
Neutrophils Relative %: 74 %
PLATELETS: 358 10*3/uL (ref 150–400)
RBC: 4.43 MIL/uL (ref 3.87–5.11)
RDW: 13.9 % (ref 11.5–15.5)
WBC: 13.4 10*3/uL — ABNORMAL HIGH (ref 4.0–10.5)

## 2015-11-28 LAB — COMPREHENSIVE METABOLIC PANEL
ALBUMIN: 4.2 g/dL (ref 3.5–5.0)
ALT: 15 U/L (ref 14–54)
AST: 24 U/L (ref 15–41)
Alkaline Phosphatase: 77 U/L (ref 38–126)
Anion gap: 10 (ref 5–15)
BILIRUBIN TOTAL: 0.5 mg/dL (ref 0.3–1.2)
BUN: 13 mg/dL (ref 6–20)
CHLORIDE: 99 mmol/L — AB (ref 101–111)
CO2: 30 mmol/L (ref 22–32)
CREATININE: 1.1 mg/dL — AB (ref 0.44–1.00)
Calcium: 10 mg/dL (ref 8.9–10.3)
GFR calc Af Amer: >60 mL/min (ref >60)
GFR calc non Af Amer: 57 mL/min — ABNORMAL LOW (ref >60)
GLUCOSE: 104 mg/dL — AB (ref 65–99)
POTASSIUM: 2.9 mmol/L — AB (ref 3.5–5.1)
Sodium: 139 mmol/L (ref 135–145)
Total Protein: 8.4 g/dL — ABNORMAL HIGH (ref 6.5–8.1)

## 2015-11-28 LAB — LIPASE, BLOOD: LIPASE: 26 U/L (ref 11–51)

## 2015-11-28 LAB — I-STAT TROPONIN, ED: TROPONIN I, POC: 0 ng/mL (ref 0.00–0.08)

## 2015-11-28 MED ORDER — FAMOTIDINE 20 MG PO TABS: 20.0000 mg | ORAL_TABLET | Freq: Two times a day (BID) | ORAL | Status: DC

## 2015-11-28 MED ORDER — SODIUM CHLORIDE 0.9 % IV BOLUS (SEPSIS)
500.0000 mL | Freq: Once | INTRAVENOUS | Status: AC
  Administered 2015-11-28: 500 mL via INTRAVENOUS

## 2015-11-28 MED ORDER — SODIUM CHLORIDE 0.9 % IV SOLN
INTRAVENOUS | Status: DC
  Administered 2015-11-28: 13:00:00 via INTRAVENOUS

## 2015-11-28 MED ORDER — DIPHENHYDRAMINE HCL 50 MG/ML IJ SOLN
25.0000 mg | Freq: Once | INTRAMUSCULAR | Status: AC
  Administered 2015-11-28: 25 mg via INTRAVENOUS
  Filled 2015-11-28: qty 1

## 2015-11-28 MED ORDER — POTASSIUM CHLORIDE ER 10 MEQ PO TBCR: 10.0000 meq | EXTENDED_RELEASE_TABLET | Freq: Two times a day (BID) | ORAL | Status: DC

## 2015-11-28 MED ORDER — ONDANSETRON HCL 4 MG/2ML IJ SOLN
4.0000 mg | Freq: Once | INTRAMUSCULAR | Status: AC
  Administered 2015-11-28: 4 mg via INTRAVENOUS
  Filled 2015-11-28: qty 2

## 2015-11-28 MED ORDER — ONDANSETRON 4 MG PO TBDP
4.0000 mg | ORAL_TABLET | Freq: Three times a day (TID) | ORAL | Status: DC | PRN
Start: 2015-11-28 — End: 2018-10-31

## 2015-11-28 MED ORDER — ONDANSETRON 8 MG PO TBDP
8.0000 mg | ORAL_TABLET | Freq: Once | ORAL | Status: AC
  Administered 2015-11-28: 8 mg via ORAL
  Filled 2015-11-28: qty 1

## 2015-11-28 MED ORDER — IOPAMIDOL (ISOVUE-300) INJECTION 61%
100.0000 mL | Freq: Once | INTRAVENOUS | Status: AC | PRN
  Administered 2015-11-28: 100 mL via INTRAVENOUS

## 2015-11-28 MED ORDER — TRAMADOL HCL 50 MG PO TABS: 50.0000 mg | ORAL_TABLET | Freq: Four times a day (QID) | ORAL | Status: DC | PRN

## 2015-11-28 MED ORDER — FENTANYL CITRATE (PF) 100 MCG/2ML IJ SOLN
50.0000 ug | Freq: Once | INTRAMUSCULAR | Status: AC
  Administered 2015-11-28: 50 ug via INTRAVENOUS
  Filled 2015-11-28: qty 2

## 2015-11-28 NOTE — ED Notes (Signed)
Patient reports recently started taking oxycodone for pain (s/p knee replacement), and is making her very nauseous. Pt also reports vague abdominal pain since taking oxycodone. Reports similar reaction to oxycodone in past, "many yeasrs ago"

## 2015-11-28 NOTE — ED Notes (Signed)
Pt reports LUQ abd pain since yesterday associated with nausea. Threw up yesterday but none today. LBM 3 days ago.

## 2015-11-28 NOTE — Discharge Instructions (Signed)
Take the Pepcid on a regular basis over the next 2 weeks. Take the tramadol for pain. Take Zofran as needed for nausea. Make appoint with follow-up with your regular doctor. Return for any new or worse symptoms. In addition your potassium was slightly low take the potassium supplement as directed over the next few days. Follow-up with her regular doctor having potassium rechecked.

## 2015-11-28 NOTE — ED Provider Notes (Signed)
CSN: [id number]     Arrival date & time 11/28/15  1029 History   First MD Initiated Contact with Patient 11/28/15 1055     Chief Complaint  Patient presents with  . Abdominal Pain     (Consider location/radiation/quality/duration/timing/severity/associated sxs/prior Treatment) Patient is a 55 y.o. female presenting with abdominal pain. The history is provided by the patient.  Abdominal Pain Associated symptoms: nausea and vomiting   Associated symptoms: no chest pain, no dysuria, no fever and no shortness of breath   Patient status post right knee replacement at the end of February. Patient undergoing physical therapy for that. Patient last evening took some oxycodone for pain and developed epigastric abdominal pain and around 10 in the evening associated with nausea and vomiting. Pain is 6 out of 10 nonradiating. Described as sharp ache in nature. Patient's had her gallbladder removed has had a hysterectomy. Pain does not radiate to the back. Patient felt that the nausea and vomiting was probably related to the pain medicine she's had that happen before but she's never had this sharp epigastric pain before.  Past Medical History  Diagnosis Date  . Hypertension   . Depression   . PVC's (premature ventricular contractions)   . GERD (gastroesophageal reflux disease)   . Headache     sinus headaches  . Arthritis    Past Surgical History  Procedure Laterality Date  . Abdominal hysterectomy    . Cholecystectomy    . Total knee arthroplasty Right 10/13/2015    Procedure: RIGHT TOTAL KNEE ARTHROPLASTY;  Surgeon: Ranee Gosselin, MD;  Location: [facility];  Service: Orthopedics;  Laterality: Right;   History reviewed. No pertinent family history. Social History  Substance Use Topics  . Smoking status: Never Smoker   . Smokeless tobacco: Never Used  . Alcohol Use: Yes     Comment: Social drinker   OB History    No data available     Review of Systems  Constitutional: Negative for fever.   HENT: Negative for congestion.   Respiratory: Negative for shortness of breath.   Cardiovascular: Negative for chest pain.  Gastrointestinal: Positive for nausea, vomiting and abdominal pain.  Genitourinary: Negative for dysuria.  Musculoskeletal: Positive for joint swelling. Negative for back pain.  Neurological: Negative for headaches.  Hematological: Does not bruise/bleed easily.  Psychiatric/Behavioral: Negative for confusion.      Allergies  Dilaudid; Erythromycin; Penicillins; Percocet; and Zithromax  Home Medications   Prior to Admission medications   Medication Sig Start Date End Date Taking? Authorizing Provider  amLODipine (NORVASC) 10 MG tablet Take 10 mg by mouth daily.   Yes Historical Provider, MD  buPROPion (WELLBUTRIN XL) 300 MG 24 hr tablet Take 300 mg by mouth daily.   Yes Historical Provider, MD  FLUoxetine (PROZAC) 20 MG capsule Take 20 mg by mouth daily.   Yes Historical Provider, MD  hydrochlorothiazide (HYDRODIURIL) 25 MG tablet Take 25 mg by mouth every morning.   Yes Historical Provider, MD  Multiple Vitamin (MULTIVITAMIN WITH MINERALS) TABS tablet Take 1 tablet by mouth daily.   Yes Historical Provider, MD  oxyCODONE (OXY IR/ROXICODONE) 5 MG immediate release tablet Take 1 tablet by mouth every 4 (four) hours as needed. Pain. 11/24/15  Yes Historical Provider, MD  potassium chloride SA (K-DUR,KLOR-CON) 20 MEQ tablet Take 1 tablet (20 mEq total) by mouth daily. 03/06/15  Yes Cathren Laine, MD  tiZANidine (ZANAFLEX) 4 MG capsule Take 1 capsule by mouth every 8 (eight) hours as needed. Muscle spasms. 11/24/15  Yes Historical Provider, MD  valsartan (DIOVAN) 320 MG tablet Take 320 mg by mouth every morning.   Yes Historical Provider, MD  aspirin EC 325 MG tablet Take 1 tablet (325 mg total) by mouth 2 (two) times daily. To prevent blood clots Patient not taking: Reported on 11/28/2015 10/15/15   Dimitri Ped, PA-C  famotidine (PEPCID) 20 MG tablet Take 1 tablet (20 mg  total) by mouth 2 (two) times daily. 11/28/15   Vanetta Mulders, MD  HYDROmorphone (DILAUDID) 2 MG tablet Take 1 tablet (2 mg total) by mouth every 3 (three) hours as needed for moderate pain. Patient not taking: Reported on 11/28/2015 10/15/15   Dimitri Ped, PA-C  methocarbamol (ROBAXIN) 500 MG tablet Take 1 tablet (500 mg total) by mouth every 6 (six) hours as needed for muscle spasms. Patient not taking: Reported on 11/28/2015 10/15/15   Dimitri Ped, PA-C  ondansetron (ZOFRAN ODT) 4 MG disintegrating tablet Take 1 tablet (4 mg total) by mouth every 8 (eight) hours as needed for nausea or vomiting. 11/28/15   Vanetta Mulders, MD  potassium chloride (K-DUR) 10 MEQ tablet Take 1 tablet (10 mEq total) by mouth 2 (two) times daily. 11/28/15   Vanetta Mulders, MD  traMADol (ULTRAM) 50 MG tablet Take 1 tablet (50 mg total) by mouth every 6 (six) hours as needed. 11/28/15   Vanetta Mulders, MD   BP 140/82 mmHg  Pulse 85  Temp(Src) 98.2 F (36.8 C) (Oral)  Resp 26  SpO2 97% Physical Exam  Constitutional: She is oriented to person, place, and time. She appears well-developed and well-nourished. No distress.  HENT:  Head: Normocephalic and atraumatic.  Mouth/Throat: Oropharynx is clear and moist.  Eyes: Conjunctivae and EOM are normal. Pupils are equal, round, and reactive to light.  Neck: Normal range of motion. Neck supple.  Cardiovascular: Normal rate, regular rhythm and normal heart sounds.   Pulmonary/Chest: Effort normal and breath sounds normal. No respiratory distress.  Abdominal: Soft. Bowel sounds are normal. There is no tenderness.  Musculoskeletal: Normal range of motion.  Patient status post right knee replacement incisions well-healed. There is still some swelling and slight increased warmth. No tenderness to palpation.  Neurological: She is alert and oriented to person, place, and time. No cranial nerve deficit. She exhibits normal muscle tone. Coordination normal.  Skin: Skin is warm.   Nursing note and vitals reviewed.   ED Course  Procedures (including critical care time) Labs Review Labs Reviewed  COMPREHENSIVE METABOLIC PANEL - Abnormal; Notable for the following:    Potassium 2.9 (*)    Chloride 99 (*)    Glucose, Bld 104 (*)    Creatinine, Ser 1.10 (*)    Total Protein 8.4 (*)    GFR calc non Af Amer 57 (*)    All other components within normal limits  CBC WITH DIFFERENTIAL/PLATELET - Abnormal; Notable for the following:    WBC 13.4 (*)    HCT 35.3 (*)    Neutro Abs 9.9 (*)    All other components within normal limits  LIPASE, BLOOD  I-STAT TROPOININ, ED    Imaging Review Dg Chest 2 View  11/28/2015  CLINICAL DATA:  55 year old female with a history of epigastric abdominal pain EXAM: CHEST - 2 VIEW COMPARISON:  03/06/2015 FINDINGS: Cardiomediastinal silhouette projects within normal limits in size and contour. No confluent airspace disease, pneumothorax, or pleural effusion. No displaced fracture. Unremarkable appearance of the upper abdomen. IMPRESSION: No radiographic evidence of acute cardiopulmonary disease. Signed, Yvone Neu.  Loreta Ave, DO [facility] Electronically Signed   By: Gilmer Mor D.O.   On: 11/28/2015 13:07   Ct Abdomen Pelvis W Contrast  11/28/2015  CLINICAL DATA:  Left upper quadrant pain since yesterday. Nausea and vomiting. EXAM: CT ABDOMEN AND PELVIS WITH CONTRAST TECHNIQUE: Multidetector CT imaging of the abdomen and pelvis was performed using the standard protocol following bolus administration of intravenous contrast. CONTRAST:  ISOVUE-300 IOPAMIDOL (ISOVUE-300) INJECTION 61% COMPARISON:  February 09, 2004 FINDINGS: There is a small hiatal hernia. The lung bases are otherwise normal. No free air or free fluid. The patient is status post cholecystectomy. The liver, portal vein, spleen, adrenal glands, and left kidney are normal. Probable tiny cyst in the right kidney on image 24 of  series 2. The right kidney is otherwise normal as well. Pancreas is normal. Other than the small hiatal hernia, the stomach is normal. Scattered colonic diverticuli are seen without diverticulitis. The appendix is seen with no appendicitis. The small bowel is normal as well. The abdominal aorta is normal in caliber. No adenopathy. There is a fat containing umbilical hernia. The pelvis demonstrates no adenopathy. The patient is status post hysterectomy. The ovaries appear to have been retained however with a small 1 cm cyst seen in each ovary. The bladder is normal. Delayed images through the kidneys demonstrate no filling defects within the renal collecting systems. Small renal lesions are consistent with tiny cysts but too small to characterize. IMPRESSION: 1. No cause for the patient's symptoms identified per Electronically Signed   By: Gerome Sam III M.D   On: 11/28/2015 14:42   I have personally reviewed and evaluated these images and lab results as part of my medical decision-making.   EKG Interpretation   Date/Time:  Saturday November 28 2015 12:54:14 EDT Ventricular Rate:  82 PR Interval:  167 QRS Duration: 85 QT Interval:  423 QTC Calculation: 494 R Axis:   26 Text Interpretation:  Sinus rhythm Borderline repolarization abnormality  Borderline prolonged QT interval Confirmed by Oseias Horsey  MD, Tanaysia Bhardwaj ([id number])  on 11/28/2015 3:28:45 PM      MDM   Final diagnoses:  Abdominal pain, acute, epigastric    Patient with onset of epigastric abdominal pain. Onset of symptoms for around the 10:00 last evening. Associated with nausea and vomiting but no blood in the vomit. Patient noticed onset of symptoms shortly after taking oxycodone. Patient has had trouble with nausea and vomiting related to pain medicines the past but never had the epigastric abdominal pain.  Workup here without any significant findings CT scan without any acute abdominal processes. Patient's already had her gallbladder  removed. Lipase is normal. Liver function test are normal. Labs to show evidence of some mild hypokalemia. There is a mild leukocytosis. No fever. Patient only with slight tenderness to palpation in the epigastric area. Patient will be treated as if this could perhaps be a gastritis or peptic ulcer process also could just be irritation from the pain medicine. Patient will stop the oxycodone will start tramadol will take antinausea medicine with and will do a two-week course of Pepcid. Also will be treated with minerals potassium. And will follow-up with her primary care Dr. for recheck.  In addition patient did have the evaluation done for possible cardiac event troponins was negative. Chest x-ray was negative for any evidence of pneumonia pneumothorax or pulmonary edema.  Patient nontoxic no acute distress. Patient was significant improvement here.     Vanetta Mulders,  MD 11/28/15 1530

## 2018-10-05 DIAGNOSIS — N183 Chronic kidney disease, stage 3 (moderate): Secondary | ICD-10-CM | POA: Diagnosis not present

## 2018-10-05 DIAGNOSIS — I10 Essential (primary) hypertension: Secondary | ICD-10-CM | POA: Diagnosis not present

## 2018-10-05 DIAGNOSIS — K641 Second degree hemorrhoids: Secondary | ICD-10-CM | POA: Diagnosis not present

## 2018-10-08 DIAGNOSIS — S46011A Strain of muscle(s) and tendon(s) of the rotator cuff of right shoulder, initial encounter: Secondary | ICD-10-CM | POA: Diagnosis not present

## 2018-10-08 DIAGNOSIS — Y999 Unspecified external cause status: Secondary | ICD-10-CM | POA: Diagnosis not present

## 2018-10-08 DIAGNOSIS — M75121 Complete rotator cuff tear or rupture of right shoulder, not specified as traumatic: Secondary | ICD-10-CM | POA: Diagnosis not present

## 2018-10-08 DIAGNOSIS — G8918 Other acute postprocedural pain: Secondary | ICD-10-CM | POA: Diagnosis not present

## 2018-10-08 DIAGNOSIS — M24111 Other articular cartilage disorders, right shoulder: Secondary | ICD-10-CM | POA: Diagnosis not present

## 2018-10-08 DIAGNOSIS — M7541 Impingement syndrome of right shoulder: Secondary | ICD-10-CM | POA: Diagnosis not present

## 2018-10-08 DIAGNOSIS — M25711 Osteophyte, right shoulder: Secondary | ICD-10-CM | POA: Diagnosis not present

## 2018-10-08 DIAGNOSIS — X58XXXA Exposure to other specified factors, initial encounter: Secondary | ICD-10-CM | POA: Diagnosis not present

## 2018-10-08 DIAGNOSIS — S43431A Superior glenoid labrum lesion of right shoulder, initial encounter: Secondary | ICD-10-CM | POA: Diagnosis not present

## 2018-10-08 DIAGNOSIS — S46291A Other injury of muscle, fascia and tendon of other parts of biceps, right arm, initial encounter: Secondary | ICD-10-CM | POA: Diagnosis not present

## 2018-10-08 DIAGNOSIS — M7521 Bicipital tendinitis, right shoulder: Secondary | ICD-10-CM | POA: Diagnosis not present

## 2018-10-18 DIAGNOSIS — M24111 Other articular cartilage disorders, right shoulder: Secondary | ICD-10-CM | POA: Diagnosis not present

## 2018-10-30 ENCOUNTER — Emergency Department (HOSPITAL_COMMUNITY): Payer: BLUE CROSS/BLUE SHIELD

## 2018-10-30 ENCOUNTER — Observation Stay (HOSPITAL_COMMUNITY)
Admission: EM | Admit: 2018-10-30 | Discharge: 2018-10-31 | Disposition: A | Discharge status: Home / Self Care | Payer: BLUE CROSS/BLUE SHIELD | Attending: Internal Medicine | Admitting: Internal Medicine

## 2018-10-30 ENCOUNTER — Other Ambulatory Visit: Payer: Self-pay

## 2018-10-30 ENCOUNTER — Encounter (HOSPITAL_COMMUNITY): Payer: Self-pay | Admitting: *Deleted

## 2018-10-30 DIAGNOSIS — I493 Ventricular premature depolarization: Secondary | ICD-10-CM | POA: Diagnosis not present

## 2018-10-30 DIAGNOSIS — E876 Hypokalemia: Secondary | ICD-10-CM | POA: Insufficient documentation

## 2018-10-30 DIAGNOSIS — Z7982 Long term (current) use of aspirin: Secondary | ICD-10-CM | POA: Insufficient documentation

## 2018-10-30 DIAGNOSIS — I10 Essential (primary) hypertension: Secondary | ICD-10-CM | POA: Insufficient documentation

## 2018-10-30 DIAGNOSIS — R42 Dizziness and giddiness: Secondary | ICD-10-CM | POA: Diagnosis not present

## 2018-10-30 DIAGNOSIS — Z7951 Long term (current) use of inhaled steroids: Secondary | ICD-10-CM | POA: Insufficient documentation

## 2018-10-30 DIAGNOSIS — Z79899 Other long term (current) drug therapy: Secondary | ICD-10-CM | POA: Insufficient documentation

## 2018-10-30 DIAGNOSIS — I499 Cardiac arrhythmia, unspecified: Secondary | ICD-10-CM | POA: Diagnosis not present

## 2018-10-30 DIAGNOSIS — R110 Nausea: Secondary | ICD-10-CM | POA: Diagnosis not present

## 2018-10-30 DIAGNOSIS — F329 Major depressive disorder, single episode, unspecified: Secondary | ICD-10-CM | POA: Insufficient documentation

## 2018-10-30 DIAGNOSIS — R55 Syncope and collapse: Secondary | ICD-10-CM | POA: Diagnosis not present

## 2018-10-30 DIAGNOSIS — K219 Gastro-esophageal reflux disease without esophagitis: Secondary | ICD-10-CM | POA: Diagnosis not present

## 2018-10-30 DIAGNOSIS — R29818 Other symptoms and signs involving the nervous system: Secondary | ICD-10-CM | POA: Diagnosis not present

## 2018-10-30 DIAGNOSIS — M1990 Unspecified osteoarthritis, unspecified site: Secondary | ICD-10-CM | POA: Insufficient documentation

## 2018-10-30 HISTORY — DX: Dizziness and giddiness: R42

## 2018-10-30 HISTORY — DX: Ventricular premature depolarization: I49.3

## 2018-10-30 HISTORY — DX: Essential (primary) hypertension: I10

## 2018-10-30 LAB — BASIC METABOLIC PANEL
Anion gap: 9 (ref 5–15)
BUN: 15 mg/dL (ref 6–20)
CHLORIDE: 103 mmol/L (ref 98–111)
CO2: 26 mmol/L (ref 22–32)
Calcium: 8.6 mg/dL — ABNORMAL LOW (ref 8.9–10.3)
Creatinine, Ser: 0.97 mg/dL (ref 0.44–1.00)
GFR calc Af Amer: >60 mL/min (ref >60)
GFR calc non Af Amer: >60 mL/min (ref >60)
Glucose, Bld: 172 mg/dL — ABNORMAL HIGH (ref 70–99)
POTASSIUM: 2.7 mmol/L — AB (ref 3.5–5.1)
Sodium: 138 mmol/L (ref 135–145)

## 2018-10-30 LAB — CBC
HEMATOCRIT: 34.1 % — AB (ref 36.0–46.0)
HEMOGLOBIN: 11.3 g/dL — AB (ref 12.0–15.0)
MCH: 28.1 pg (ref 26.0–34.0)
MCHC: 33.1 g/dL (ref 30.0–36.0)
MCV: 84.8 fL (ref 80.0–100.0)
NRBC: 0 % (ref 0.0–0.2)
Platelets: 330 10*3/uL (ref 150–400)
RBC: 4.02 MIL/uL (ref 3.87–5.11)
RDW: 14.9 % (ref 11.5–15.5)
WBC: 12.6 10*3/uL — ABNORMAL HIGH (ref 4.0–10.5)

## 2018-10-30 LAB — URINALYSIS, ROUTINE W REFLEX MICROSCOPIC
Bilirubin Urine: NEGATIVE
Glucose, UA: NEGATIVE mg/dL
Hgb urine dipstick: NEGATIVE
KETONES UR: NEGATIVE mg/dL
LEUKOCYTE UA: NEGATIVE
Nitrite: NEGATIVE
PH: 6 (ref 5.0–8.0)
Protein, ur: NEGATIVE mg/dL
SPECIFIC GRAVITY, URINE: 1.01 (ref 1.005–1.030)

## 2018-10-30 LAB — I-STAT BETA HCG BLOOD, ED (MC, WL, AP ONLY)

## 2018-10-30 LAB — POTASSIUM: Potassium: 3.6 mmol/L (ref 3.5–5.1)

## 2018-10-30 LAB — CBG MONITORING, ED: Glucose-Capillary: 166 mg/dL — ABNORMAL HIGH (ref 70–99)

## 2018-10-30 MED ORDER — ACETAMINOPHEN 325 MG PO TABS
650.0000 mg | ORAL_TABLET | Freq: Once | ORAL | Status: AC
  Administered 2018-10-30: 650 mg via ORAL
  Filled 2018-10-30: qty 2

## 2018-10-30 MED ORDER — MECLIZINE HCL 25 MG PO TABS
25.0000 mg | ORAL_TABLET | Freq: Once | ORAL | Status: AC
  Administered 2018-10-30: 25 mg via ORAL
  Filled 2018-10-30: qty 1

## 2018-10-30 MED ORDER — AMLODIPINE BESYLATE 5 MG PO TABS
10.0000 mg | ORAL_TABLET | Freq: Every day | ORAL | Status: DC
  Administered 2018-10-30 – 2018-10-31 (×2): 10 mg via ORAL
  Filled 2018-10-30 (×3): qty 2

## 2018-10-30 MED ORDER — FLUTICASONE PROPIONATE 50 MCG/ACT NA SUSP
1.0000 | Freq: Every day | NASAL | Status: DC | PRN
  Filled 2018-10-30: qty 16

## 2018-10-30 MED ORDER — ADULT MULTIVITAMIN W/MINERALS CH
1.0000 | ORAL_TABLET | Freq: Every day | ORAL | Status: DC
  Administered 2018-10-30 – 2018-10-31 (×2): 1 via ORAL
  Filled 2018-10-30 (×3): qty 1

## 2018-10-30 MED ORDER — IRBESARTAN 300 MG PO TABS
300.0000 mg | ORAL_TABLET | Freq: Every day | ORAL | Status: DC
  Administered 2018-10-30 – 2018-10-31 (×2): 300 mg via ORAL
  Filled 2018-10-30: qty 1
  Filled 2018-10-30 (×3): qty 2
  Filled 2018-10-30: qty 1

## 2018-10-30 MED ORDER — POTASSIUM CHLORIDE 10 MEQ/100ML IV SOLN
10.0000 meq | Freq: Once | INTRAVENOUS | Status: AC
  Administered 2018-10-30: 10 meq via INTRAVENOUS
  Filled 2018-10-30: qty 100

## 2018-10-30 MED ORDER — ESCITALOPRAM OXALATE 10 MG PO TABS
10.0000 mg | ORAL_TABLET | Freq: Every day | ORAL | Status: DC
  Administered 2018-10-30 – 2018-10-31 (×2): 10 mg via ORAL
  Filled 2018-10-30 (×3): qty 1

## 2018-10-30 MED ORDER — SODIUM CHLORIDE 0.9 % IV BOLUS
1000.0000 mL | Freq: Once | INTRAVENOUS | Status: AC
  Administered 2018-10-30: 1000 mL via INTRAVENOUS

## 2018-10-30 MED ORDER — ACETAMINOPHEN 325 MG PO TABS: 650.0000 mg | ORAL_TABLET | Freq: Four times a day (QID) | ORAL | Status: DC | PRN

## 2018-10-30 MED ORDER — POTASSIUM CHLORIDE CRYS ER 20 MEQ PO TBCR
60.0000 meq | EXTENDED_RELEASE_TABLET | Freq: Once | ORAL | Status: AC
  Administered 2018-10-30: 60 meq via ORAL
  Filled 2018-10-30: qty 3

## 2018-10-30 MED ORDER — POTASSIUM CHLORIDE CRYS ER 20 MEQ PO TBCR
20.0000 meq | EXTENDED_RELEASE_TABLET | Freq: Every day | ORAL | Status: DC
  Administered 2018-10-31: 20 meq via ORAL
  Filled 2018-10-30 (×2): qty 1

## 2018-10-30 MED ORDER — SODIUM CHLORIDE 0.9 % IV SOLN
INTRAVENOUS | Status: DC
Start: 2018-10-30 — End: 2018-10-31
  Administered 2018-10-30 – 2018-10-31 (×3): via INTRAVENOUS

## 2018-10-30 MED ORDER — ZOLPIDEM TARTRATE 5 MG PO TABS: 5.0000 mg | ORAL_TABLET | Freq: Every evening | ORAL | Status: DC | PRN

## 2018-10-30 MED ORDER — LORAZEPAM 2 MG/ML IJ SOLN
1.0000 mg | Freq: Once | INTRAMUSCULAR | Status: AC
  Administered 2018-10-30: 1 mg via INTRAVENOUS
  Filled 2018-10-30: qty 1

## 2018-10-30 MED ORDER — ACETAMINOPHEN 650 MG RE SUPP: 650.0000 mg | Freq: Four times a day (QID) | RECTAL | Status: DC | PRN

## 2018-10-30 MED ORDER — ENOXAPARIN SODIUM 40 MG/0.4ML ~~LOC~~ SOLN
40.0000 mg | SUBCUTANEOUS | Status: DC
  Administered 2018-10-30: 40 mg via SUBCUTANEOUS
  Filled 2018-10-30: qty 0.4

## 2018-10-30 MED ORDER — DIAZEPAM 2 MG PO TABS: 2.0000 mg | ORAL_TABLET | Freq: Four times a day (QID) | ORAL | Status: DC | PRN

## 2018-10-30 MED ORDER — BUPROPION HCL ER (XL) 150 MG PO TB24
300.0000 mg | ORAL_TABLET | Freq: Every day | ORAL | Status: DC
  Administered 2018-10-30 – 2018-10-31 (×2): 300 mg via ORAL
  Filled 2018-10-30 (×3): qty 2

## 2018-10-30 MED ORDER — MECLIZINE HCL 25 MG PO TABS: 25.0000 mg | ORAL_TABLET | Freq: Three times a day (TID) | ORAL | 0 refills | Status: DC | PRN

## 2018-10-30 MED ORDER — MECLIZINE HCL 25 MG PO TABS
25.0000 mg | ORAL_TABLET | Freq: Three times a day (TID) | ORAL | Status: DC | PRN
  Administered 2018-10-31: 25 mg via ORAL
  Filled 2018-10-30: qty 1

## 2018-10-30 NOTE — ED Notes (Signed)
Patient transported to MRI 

## 2018-10-30 NOTE — ED Notes (Addendum)
ED TO INPATIENT HANDOFF REPORT  ED Nurse Name and Phone #: Lowell Guitar [phone]  S Name/Age/Gender Andrea Robertson 58 y.o. female Room/Bed: WA06/WA06  Code Status   Code Status: Full Code  Home/SNF/Other Home Patient oriented to: self, place, time and situation Is this baseline? Yes   Triage Complete: Triage complete  Chief Complaint dizziness  Triage Note Pt reports getting up at 5am with nausea, diaphoretic, (+) orthostatic per EMS and dizziness upon standing. Pt received 4mg  of Zofran enroute. 20G LAC   Allergies Allergies  Allergen Reactions  . Dilaudid [Hydromorphone Hcl]     hives  . Erythromycin Nausea And Vomiting  . Penicillins Hives and Nausea And Vomiting    Has patient had a PCN reaction causing immediate rash, facial/tongue/throat swelling, SOB or lightheadedness with hypotension: No Has patient had a PCN reaction causing severe rash involving mucus membranes or skin necrosis: No Has patient had a PCN reaction that required hospitalization No Has patient had a PCN reaction occurring within the last 10 years: Yes If all of the above answers are "NO", then may proceed with Cephalosporin use.  Marland Kitchen Percocet [Oxycodone-Acetaminophen] Nausea And Vomiting    Ok short term.   Marland Kitchen Zithromax [Azithromycin] Nausea And Vomiting    Level of Care/Admitting Diagnosis ED Disposition    ED Disposition Condition Comment   Admit  Hospital Area[facility] [[id number]]  Level of Care: Med-Surg [16]  Diagnosis: Vertigo [207257]  Admitting Physician: Fran Lowes [phone]  Attending Physician: Gerri Lins, AVA [[id number]]  PT Class (Do Not Modify): Observation [104]  PT Acc Code (Do Not Modify): Observation [10022]       B Medical/Surgery History Past Medical History:  Diagnosis Date  . Arthritis   . Depression   . GERD (gastroesophageal reflux disease)   . Headache    sinus headaches  . Hypertension   . PVC's (premature ventricular contractions)    Past  Surgical History:  Procedure Laterality Date  . ABDOMINAL HYSTERECTOMY    . CHOLECYSTECTOMY    . TOTAL KNEE ARTHROPLASTY Right 10/13/2015   Procedure: RIGHT TOTAL KNEE ARTHROPLASTY;  Surgeon: Ranee Gosselin, MD;  Location: [facility];  Service: Orthopedics;  Laterality: Right;     A IV Location/Drains/Wounds Patient Lines/Drains/Airways Status   Active Line/Drains/Airways    Name:   Placement date:   Placement time:   Site:   Days:   Peripheral IV 10/30/18 Left Antecubital   10/30/18    1553    Antecubital   less than 1   Incision (Closed) 10/13/15 Knee Right   10/13/15    1347     1113          Intake/Output Last 24 hours No intake or output data in the 24 hours ending 10/30/18 1619  Labs/Imaging Results for orders placed or performed during the hospital encounter of 10/30/18 (from the past 48 hour(s))  Basic metabolic panel     Status: Abnormal   Collection Time: 10/30/18  6:39 AM  Result Value Ref Range   Sodium 138 135 - 145 mmol/L   Potassium 2.7 (LL) 3.5 - 5.1 mmol/L    Comment: CRITICAL RESULT CALLED TO, READ BACK BY AND VERIFIED WITH: [facility] AT 1030 10/30/18 MULLINS,T    Chloride 103 98 - 111 mmol/L   CO2 26 22 - 32 mmol/L   Glucose, Bld 172 (H) 70 - 99 mg/dL   BUN 15 6 - 20 mg/dL   Creatinine, Ser 1.31 0.44 - 1.00 mg/dL  Calcium 8.6 (L) 8.9 - 10.3 mg/dL   GFR calc non Af Amer >60 >60 mL/min   GFR calc Af Amer >60 >60 mL/min   Anion gap 9 5 - 15    Comment: Performed at [facility], [address], [place], [place] [address]  CBC     Status: Abnormal   Collection Time: 10/30/18  6:39 AM  Result Value Ref Range   WBC 12.6 (H) 4.0 - 10.5 K/uL   RBC 4.02 3.87 - 5.11 MIL/uL   Hemoglobin 11.3 (L) 12.0 - 15.0 g/dL   HCT 82.6 (L) 41.5 - 83.0 %   MCV 84.8 80.0 - 100.0 fL   MCH 28.1 26.0 - 34.0 pg   MCHC 33.1 30.0 - 36.0 g/dL   RDW 94.0 76.8 - 08.8 %   Platelets 330 150 - 400 K/uL   nRBC 0.0 0.0 - 0.2 %    Comment: Performed at [facility], [address], [place], [place] [address]  CBG monitoring, ED     Status: Abnormal   Collection Time: 10/30/18  6:45 AM  Result Value Ref Range   Glucose-Capillary 166 (H) 70 - 99 mg/dL   Comment 1 Notify RN   Urinalysis, Routine w reflex microscopic     Status: None   Collection Time: 10/30/18  7:34 AM  Result Value Ref Range   Color, Urine YELLOW YELLOW   APPearance CLEAR CLEAR   Specific Gravity, Urine 1.010 1.005 - 1.030   pH 6.0 5.0 - 8.0   Glucose, UA NEGATIVE NEGATIVE mg/dL   Hgb urine dipstick NEGATIVE NEGATIVE   Bilirubin Urine NEGATIVE NEGATIVE   Ketones, ur NEGATIVE NEGATIVE mg/dL   Protein, ur NEGATIVE NEGATIVE mg/dL   Nitrite NEGATIVE NEGATIVE   Leukocytes,Ua NEGATIVE NEGATIVE    Comment: Performed at [facility], [address], [place], [place] [address]  I-Stat beta hCG blood, ED     Status: None   Collection Time: 10/30/18  7:36 AM  Result Value Ref Range   I-stat hCG, quantitative <5.0 <5 mIU/mL   Comment 3            Comment:   GEST. AGE      CONC.  (mIU/mL)   <=1 WEEK        5 - 50     2 WEEKS       50 - 500     3 WEEKS       100 - 10,000     4 WEEKS     1,000 - 30,000        FEMALE AND NON-PREGNANT FEMALE:     LESS THAN 5 mIU/mL   Potassium     Status: None   Collection Time: 10/30/18  2:20 PM  Result Value Ref Range   Potassium 3.6 3.5 - 5.1 mmol/L    Comment: DELTA CHECK NOTED REPEATED TO VERIFY NO VISIBLE HEMOLYSIS Performed at [facility], [address], [place], [place] [address]    Mr Brain Wo Contrast (neuro Protocol)  Result Date: 10/30/2018 CLINICAL DATA:  Neuro deficit.  Nausea and diaphoresis. EXAM: MRI HEAD WITHOUT CONTRAST TECHNIQUE: Multiplanar, multiecho pulse sequences of the brain and surrounding structures were obtained without intravenous contrast. COMPARISON:  None. FINDINGS: Brain: Image quality degraded by moderate motion Negative for acute infarct. Mild chronic white  matter changes bilaterally likely due to chronic microvascular ischemia. Negative for hemorrhage or mass. Ventricle size and cerebral volume normal.  Vascular: Normal arterial flow voids Skull and upper cervical spine: Negative Sinuses/Orbits: Mild mucosal edema paranasal sinuses.  Normal orbit Other: None IMPRESSION: Motion degraded study No acute abnormality Mild chronic white matter changes likely due to microvascular ischemia. Electronically Signed   By: Marlan Palau M.D.   On: 10/30/2018 11:32    Pending Labs Unresulted Labs (From admission, onward)    Start     Ordered   11/06/18 0500  Creatinine, serum  (enoxaparin (LOVENOX)    CrCl >/= 30 ml/min)  Weekly,   R    Comments:  while on enoxaparin therapy    10/30/18 1605   10/31/18 0500  Basic metabolic panel  Tomorrow morning,   R     10/30/18 1605   10/31/18 0500  CBC  Tomorrow morning,   R     10/30/18 1605   10/30/18 1603  CBC  (enoxaparin (LOVENOX)    CrCl >/= 30 ml/min)  Once,   R    Comments:  Baseline for enoxaparin therapy IF NOT ALREADY DRAWN.  Notify MD if PLT < 100 K.    10/30/18 1605   10/30/18 1603  Creatinine, serum  (enoxaparin (LOVENOX)    CrCl >/= 30 ml/min)  Once,   R    Comments:  Baseline for enoxaparin therapy IF NOT ALREADY DRAWN.    10/30/18 1605   10/30/18 1559  HIV antibody (Routine Testing)  Once,   R     10/30/18 1605          Vitals/Pain Today's Vitals   10/30/18 0725 10/30/18 1000 10/30/18 1241 10/30/18 1400  BP: (!) 143/75 (!) 141/76 (!) 158/91 (!) 145/93  Pulse: 79 86 83 80  Resp: 12 (!) 22 (!) 9 16  SpO2: 97% 97% 100% 96%    Isolation Precautions No active isolations  Medications Medications  0.9 %  sodium chloride infusion ( Intravenous New Bag/Given 10/30/18 0818)  enoxaparin (LOVENOX) injection 40 mg (has no administration in time range)  acetaminophen (TYLENOL) tablet 650 mg (has no administration in time range)    Or  acetaminophen (TYLENOL) suppository 650 mg (has no  administration in time range)  meclizine (ANTIVERT) tablet 25 mg (has no administration in time range)  sodium chloride 0.9 % bolus 1,000 mL (0 mLs Intravenous Stopped 10/30/18 1015)  potassium chloride SA (K-DUR,KLOR-CON) CR tablet 60 mEq (60 mEq Oral Given 10/30/18 0818)  potassium chloride 10 mEq in 100 mL IVPB (0 mEq Intravenous Stopped 10/30/18 1025)  meclizine (ANTIVERT) tablet 25 mg (25 mg Oral Given 10/30/18 1236)  acetaminophen (TYLENOL) tablet 650 mg (650 mg Oral Given 10/30/18 1247)  meclizine (ANTIVERT) tablet 25 mg (25 mg Oral Given 10/30/18 1334)  LORazepam (ATIVAN) injection 1 mg (1 mg Intravenous Given 10/30/18 1554)    Mobility High Risk: Non ambulatory due to dizziness.  Focused Assessments Neuro Assessment Handoff:  Swallow screen pass? Yes  Cardiac Rhythm: Normal sinus rhythm   Last date known well: 10/30/18 Last time known well: 0500 Neuro Assessment: Within Defined Limits Neuro Checks:      Last Documented NIHSS Modified Score:   Has TPA been given? No If patient is a Neuro Trauma and patient is going to OR before floor call report to 4N Charge nurse: [phone] or [phone]     R Recommendations: See Admitting Provider Note  Report given to:   Additional Notes:

## 2018-10-30 NOTE — ED Provider Notes (Addendum)
[facility]-EMERGENCY DEPT Provider Note   CSN: [id number] Arrival date & time: 10/30/18  9432    History   Chief Complaint Chief Complaint  Patient presents with  . Dizziness    HPI Andrea Robertson is a 58 y.o. female.     58 year old female presents with dizziness that began when she awoke this morning.  Patient denies any headache or visual changes.  No ear discomfort.  Was in her baseline state of health yesterday.  States that when she tried to stand up she became lightheaded and room began to spin.  Denies any chest pain or shortness of breath.  No abdominal discomfort.  No recent history of blood loss.  Symptoms better with remaining still.  No prior history of same.  Has had issues with high blood pressure at home due to recent medication changes by her primary care doctor.  Blood pressure at home is been running in the 160s over 90s.  No new treatments have been done for this.     Past Medical History:  Diagnosis Date  . Arthritis   . Depression   . GERD (gastroesophageal reflux disease)   . Headache    sinus headaches  . Hypertension   . PVC's (premature ventricular contractions)     Patient Active Problem List   Diagnosis Date Noted  . History of total knee arthroplasty 10/13/2015    Past Surgical History:  Procedure Laterality Date  . ABDOMINAL HYSTERECTOMY    . CHOLECYSTECTOMY    . TOTAL KNEE ARTHROPLASTY Right 10/13/2015   Procedure: RIGHT TOTAL KNEE ARTHROPLASTY;  Surgeon: Ranee Gosselin, MD;  Location: [facility];  Service: Orthopedics;  Laterality: Right;     OB History   No obstetric history on file.      Home Medications    Prior to Admission medications   Medication Sig Start Date End Date Taking? Authorizing Provider  amLODipine (NORVASC) 10 MG tablet Take 10 mg by mouth daily.    [provider]  aspirin EC 325 MG tablet Take 1 tablet (325 mg total) by mouth 2 (two) times daily. To prevent blood  clots Patient not taking: Reported on 11/28/2015 10/15/15   Dimitri Ped, PA-C  buPROPion (WELLBUTRIN XL) 300 MG 24 hr tablet Take 300 mg by mouth daily.    [provider]  famotidine (PEPCID) 20 MG tablet Take 1 tablet (20 mg total) by mouth 2 (two) times daily. 11/28/15   Vanetta Mulders, MD  FLUoxetine (PROZAC) 20 MG capsule Take 20 mg by mouth daily.    [provider]  hydrochlorothiazide (HYDRODIURIL) 25 MG tablet Take 25 mg by mouth every morning.    [provider]  HYDROmorphone (DILAUDID) 2 MG tablet Take 1 tablet (2 mg total) by mouth every 3 (three) hours as needed for moderate pain. Patient not taking: Reported on 11/28/2015 10/15/15   Dimitri Ped, PA-C  methocarbamol (ROBAXIN) 500 MG tablet Take 1 tablet (500 mg total) by mouth every 6 (six) hours as needed for muscle spasms. Patient not taking: Reported on 11/28/2015 10/15/15   Dimitri Ped, PA-C  Multiple Vitamin (MULTIVITAMIN WITH MINERALS) TABS tablet Take 1 tablet by mouth daily.    [provider]  ondansetron (ZOFRAN ODT) 4 MG disintegrating tablet Take 1 tablet (4 mg total) by mouth every 8 (eight) hours as needed for nausea or vomiting. 11/28/15   Vanetta Mulders, MD  oxyCODONE (OXY IR/ROXICODONE) 5 MG immediate release tablet Take 1 tablet by mouth every 4 (  four) hours as needed. Pain. 11/24/15   [provider]  potassium chloride (K-DUR) 10 MEQ tablet Take 1 tablet (10 mEq total) by mouth 2 (two) times daily. 11/28/15   Vanetta Mulders, MD  potassium chloride SA (K-DUR,KLOR-CON) 20 MEQ tablet Take 1 tablet (20 mEq total) by mouth daily. 03/06/15   Cathren Laine, MD  tiZANidine (ZANAFLEX) 4 MG capsule Take 1 capsule by mouth every 8 (eight) hours as needed. Muscle spasms. 11/24/15   [provider]  traMADol (ULTRAM) 50 MG tablet Take 1 tablet (50 mg total) by mouth every 6 (six) hours as needed. 11/28/15   Vanetta Mulders, MD  valsartan (DIOVAN) 320 MG tablet Take 320 mg by  mouth every morning.    [provider]    Family History No family history on file.  Social History Social History   Tobacco Use  . Smoking status: Never Smoker  . Smokeless tobacco: Never Used  Substance Use Topics  . Alcohol use: Yes    Comment: Social drinker  . Drug use: No     Allergies   Dilaudid [hydromorphone hcl]; Erythromycin; Penicillins; Percocet [oxycodone-acetaminophen]; and Zithromax [azithromycin]   Review of Systems Review of Systems  All other systems reviewed and are negative.    Physical Exam Updated Vital Signs BP (!) 143/75   Pulse 79   Resp 12   SpO2 97%   Physical Exam Vitals signs and nursing note reviewed.  Constitutional:      General: She is not in acute distress.    Appearance: Normal appearance. She is well-developed. She is not toxic-appearing.  HENT:     Head: Normocephalic and atraumatic.  Eyes:     General: Lids are normal.     Conjunctiva/sclera: Conjunctivae normal.     Pupils: Pupils are equal, round, and reactive to light.  Neck:     Musculoskeletal: Normal range of motion and neck supple.     Thyroid: No thyroid mass.     Trachea: No tracheal deviation.  Cardiovascular:     Rate and Rhythm: Normal rate and regular rhythm.     Heart sounds: Normal heart sounds. No murmur. No gallop.   Pulmonary:     Effort: Pulmonary effort is normal. No respiratory distress.     Breath sounds: Normal breath sounds. No stridor. No decreased breath sounds, wheezing, rhonchi or rales.  Abdominal:     General: Bowel sounds are normal. There is no distension.     Palpations: Abdomen is soft.     Tenderness: There is no abdominal tenderness. There is no rebound.  Musculoskeletal: Normal range of motion.        General: No tenderness.  Skin:    General: Skin is warm and dry.     Findings: No abrasion or rash.  Neurological:     Mental Status: She is alert and oriented to person, place, and time.     GCS: GCS eye subscore is  4. GCS verbal subscore is 5. GCS motor subscore is 6.     Cranial Nerves: No cranial nerve deficit.     Sensory: No sensory deficit.  Psychiatric:        Speech: Speech normal.        Behavior: Behavior normal.      ED Treatments / Results  Labs (all labs ordered are listed, but only abnormal results are displayed) Labs Reviewed  BASIC METABOLIC PANEL - Abnormal; Notable for the following components:      Result Value  Potassium 2.7 (*)    Glucose, Bld 172 (*)    Calcium 8.6 (*)    All other components within normal limits  CBC - Abnormal; Notable for the following components:   WBC 12.6 (*)    Hemoglobin 11.3 (*)    HCT 34.1 (*)    All other components within normal limits  CBG MONITORING, ED - Abnormal; Notable for the following components:   Glucose-Capillary 166 (*)    All other components within normal limits  URINALYSIS, ROUTINE W REFLEX MICROSCOPIC  I-STAT BETA HCG BLOOD, ED ([facility], [facility], [facility] ONLY)    EKG None  Radiology No results found.  Procedures Procedures (including critical care time)  Medications Ordered in ED Medications - No data to display   Initial Impression / Assessment and Plan / ED Course  I have reviewed the triage vital signs and the nursing notes.  Pertinent labs & imaging results that were available during my care of the patient were reviewed by me and considered in my medical decision making (see chart for details).        Patient here with dizziness and feeling off balance.  Patient's labs show a mild hypokalemia of 2.7 which was treated with oral as well as IV potassium.  Because of patient's dizziness and being off balance she had MRI of her brain which was negative.  Patient also given Antivert which is greatly improved her symptoms.  Will recheck potassium and likely discharge  3:16 PM Repeat potassium now is 3.6.  Patient stable for discharge  3:37 PM While patient was being discharged patient had acute onset of becoming dizzy  and lightheaded and near syncope.  States that.  The room is spinning.  Will give 1 mg IV Ativan and admit for intractable vertigo  Final Clinical Impressions(s) / ED Diagnoses   Final diagnoses:  None    ED Discharge Orders    None       Lorre Nick, MD 10/30/18 1339    Lorre Nick, MD 10/30/18 1516    Lorre Nick, MD 10/30/18 1537

## 2018-10-30 NOTE — ED Triage Notes (Signed)
Pt reports getting up at 5am with nausea, diaphoretic, (+) orthostatic per EMS and dizziness upon standing. Pt received 4mg  of Zofran enroute. 20G LAC

## 2018-10-30 NOTE — Discharge Instructions (Signed)
Restart your potassium at 1 pill/day.  Follow-up with your doctor as needed

## 2018-10-30 NOTE — H&P (Signed)
Andrea Robertson is an 58 y.o. female.   Chief Complaint: Vertigo. Unable to stand. HPI: The patient is a 58 yr old woman who is currently obtunded due to the administration of IV lorazepam. Her husband is at bedside and gives me the history. He states that her dizziness began last night when the patient was not able to stand from a chair due to dizziness. It became much worse this morning and has been accompanied by nausea, but no nausea. She has recently undergone surgery on her right shoulder. There is some concern that her vertigo is due to anesthesia.  The patient carries a past medical history significant for arthritis, depression, GERD, sinus headaches, hypertension, and PVC's.  In the ED the patient's had a temperature of 98.1. Respiratory rate was 20. Heart rate was 83. Blood pressure was 158/91. She was saturating at 97% on room air.  Sodium was 138. Potassium was 2.7. Chloride was 103. CO2 was 26. BUN was 15. Creatinine was 0.97. WBC was 12.6. Hemoglobin was 11.3. Hematocrit was 34.1. UA was negative.   MRI brain demonstrated mild chronic white matter changes due to microvascular ischemia.  EKG demonstrated NSR with LVH and no changes from previous tracing.  The hospitalists have been consulted to admit the patient for further evaluation and treatment.  Past Medical History:  Diagnosis Date  . Arthritis   . Depression   . GERD (gastroesophageal reflux disease)   . Headache    sinus headaches  . Hypertension   . PVC's (premature ventricular contractions)     Past Surgical History:  Procedure Laterality Date  . ABDOMINAL HYSTERECTOMY    . CHOLECYSTECTOMY    . TOTAL KNEE ARTHROPLASTY Right 10/13/2015   Procedure: RIGHT TOTAL KNEE ARTHROPLASTY;  Surgeon: Ranee Gosselin, MD;  Location: [facility];  Service: Orthopedics;  Laterality: Right;    History reviewed. No pertinent family history. Social History:  reports that she has never smoked. She has never used smokeless tobacco.  She reports current alcohol use. She reports that she does not use drugs. Medications Prior to Admission  Medication Sig Dispense Refill  . acetaminophen (TYLENOL) 500 MG tablet Take 1,000 mg by mouth every 6 (six) hours as needed for mild pain or headache.    Marland Kitchen amLODipine (NORVASC) 10 MG tablet Take 10 mg by mouth daily.    Marland Kitchen buPROPion (WELLBUTRIN XL) 300 MG 24 hr tablet Take 300 mg by mouth daily.    Marland Kitchen escitalopram (LEXAPRO) 10 MG tablet Take 10 mg by mouth daily.    . fluticasone (FLONASE) 50 MCG/ACT nasal spray Place 1 spray into both nostrils daily as needed for allergies or rhinitis.    . Multiple Vitamin (MULTIVITAMIN WITH MINERALS) TABS tablet Take 1 tablet by mouth daily.    . potassium chloride SA (K-DUR,KLOR-CON) 20 MEQ tablet Take 1 tablet (20 mEq total) by mouth daily. 10 tablet 0  . valsartan (DIOVAN) 320 MG tablet Take 320 mg by mouth every morning.    Marland Kitchen aspirin EC 325 MG tablet Take 1 tablet (325 mg total) by mouth 2 (two) times daily. To prevent blood clots (Patient not taking: Reported on 11/28/2015) 30 tablet 0  . famotidine (PEPCID) 20 MG tablet Take 1 tablet (20 mg total) by mouth 2 (two) times daily. (Patient not taking: Reported on 10/30/2018) 30 tablet 0  . HYDROmorphone (DILAUDID) 2 MG tablet Take 1 tablet (2 mg total) by mouth every 3 (three) hours as needed for moderate pain. (Patient not taking: Reported  on 11/28/2015) 60 tablet 0  . methocarbamol (ROBAXIN) 500 MG tablet Take 1 tablet (500 mg total) by mouth every 6 (six) hours as needed for muscle spasms. (Patient not taking: Reported on 11/28/2015) 40 tablet 1  . ondansetron (ZOFRAN ODT) 4 MG disintegrating tablet Take 1 tablet (4 mg total) by mouth every 8 (eight) hours as needed for nausea or vomiting. (Patient not taking: Reported on 10/30/2018) 10 tablet 1  . potassium chloride (K-DUR) 10 MEQ tablet Take 1 tablet (10 mEq total) by mouth 2 (two) times daily. (Patient not taking: Reported on 10/30/2018) 6 tablet 0  . traMADol  (ULTRAM) 50 MG tablet Take 1 tablet (50 mg total) by mouth every 6 (six) hours as needed. (Patient not taking: Reported on 10/30/2018) 15 tablet 0    Allergies:  Allergies  Allergen Reactions  . Dilaudid [Hydromorphone Hcl]     hives  . Erythromycin Nausea And Vomiting  . Penicillins Hives and Nausea And Vomiting    Has patient had a PCN reaction causing immediate rash, facial/tongue/throat swelling, SOB or lightheadedness with hypotension: No Has patient had a PCN reaction causing severe rash involving mucus membranes or skin necrosis: No Has patient had a PCN reaction that required hospitalization No Has patient had a PCN reaction occurring within the last 10 years: Yes If all of the above answers are "NO", then may proceed with Cephalosporin use.  Marland Kitchen Percocet [Oxycodone-Acetaminophen] Nausea And Vomiting    Ok short term.   Marland Kitchen Zithromax [Azithromycin] Nausea And Vomiting    Pertinent items are noted in HPI.   General appearance: The patietn is chemically obtunded and unable to cooperate with history and/or physical. Head: Normocephalic, without obvious abnormality, atraumatic Eyes: positive findings: Positive for nystagmus. PERRLA, No scleral icterus or injection. Throat: lips, mucosa, and tongue normal; teeth and gums normal Neck: no adenopathy, no carotid bruit, no JVD, supple, symmetrical, trachea midline and thyroid not enlarged, symmetric, no tenderness/mass/nodules Resp: No increased work of breathing. No wheezes, rales, or rhonchi. No tactile fremitus. Chest wall: no tenderness Cardio: regular rate and rhythm, S1, S2 normal, no murmur, click, rub or gallop GI: soft, non-tender; bowel sounds normal; no masses,  no organomegaly Extremities: extremities normal, atraumatic, no cyanosis or edema Pulses: 2+ and symmetric Skin: Skin color, texture, turgor normal. No rashes or lesions Lymph nodes: Cervical, supraclavicular, and axillary nodes normal. Neurologic: Patient is unable to  cooperate with neurological exam.  Results for orders placed or performed during the hospital encounter of 10/30/18 (from the past 48 hour(s))  Basic metabolic panel     Status: Abnormal   Collection Time: 10/30/18  6:39 AM  Result Value Ref Range   Sodium 138 135 - 145 mmol/L   Potassium 2.7 (LL) 3.5 - 5.1 mmol/L    Comment: CRITICAL RESULT CALLED TO, READ BACK BY AND VERIFIED WITH: [facility] AT 3474 10/30/18 MULLINS,T    Chloride 103 98 - 111 mmol/L   CO2 26 22 - 32 mmol/L   Glucose, Bld 172 (H) 70 - 99 mg/dL   BUN 15 6 - 20 mg/dL   Creatinine, Ser 2.59 0.44 - 1.00 mg/dL   Calcium 8.6 (L) 8.9 - 10.3 mg/dL   GFR calc non Af Amer >60 >60 mL/min   GFR calc Af Amer >60 >60 mL/min   Anion gap 9 5 - 15    Comment: Performed at [facility], [address] [place], [place] [address]  CBC     Status: Abnormal  Collection Time: 10/30/18  6:39 AM  Result Value Ref Range   WBC 12.6 (H) 4.0 - 10.5 K/uL   RBC 4.02 3.87 - 5.11 MIL/uL   Hemoglobin 11.3 (L) 12.0 - 15.0 g/dL   HCT 44.9 (L) 75.3 - 00.5 %   MCV 84.8 80.0 - 100.0 fL   MCH 28.1 26.0 - 34.0 pg   MCHC 33.1 30.0 - 36.0 g/dL   RDW 11.0 21.1 - 17.3 %   Platelets 330 150 - 400 K/uL   nRBC 0.0 0.0 - 0.2 %    Comment: Performed at [facility], [address], [place], [place] [address]  CBG monitoring, ED     Status: Abnormal   Collection Time: 10/30/18  6:45 AM  Result Value Ref Range   Glucose-Capillary 166 (H) 70 - 99 mg/dL   Comment 1 Notify RN   Urinalysis, Routine w reflex microscopic     Status: None   Collection Time: 10/30/18  7:34 AM  Result Value Ref Range   Color, Urine YELLOW YELLOW   APPearance CLEAR CLEAR   Specific Gravity, Urine 1.010 1.005 - 1.030   pH 6.0 5.0 - 8.0   Glucose, UA NEGATIVE NEGATIVE mg/dL   Hgb urine dipstick NEGATIVE NEGATIVE   Bilirubin Urine NEGATIVE NEGATIVE   Ketones, ur NEGATIVE NEGATIVE mg/dL   Protein, ur NEGATIVE NEGATIVE mg/dL   Nitrite  NEGATIVE NEGATIVE   Leukocytes,Ua NEGATIVE NEGATIVE    Comment: Performed at [facility], [address], [place], [place] [address]  I-Stat beta hCG blood, ED     Status: None   Collection Time: 10/30/18  7:36 AM  Result Value Ref Range   I-stat hCG, quantitative <5.0 <5 mIU/mL   Comment 3            Comment:   GEST. AGE      CONC.  (mIU/mL)   <=1 WEEK        5 - 50     2 WEEKS       50 - 500     3 WEEKS       100 - 10,000     4 WEEKS     1,000 - 30,000        FEMALE AND NON-PREGNANT FEMALE:     LESS THAN 5 mIU/mL   Potassium     Status: None   Collection Time: 10/30/18  2:20 PM  Result Value Ref Range   Potassium 3.6 3.5 - 5.1 mmol/L    Comment: DELTA CHECK NOTED REPEATED TO VERIFY NO VISIBLE HEMOLYSIS Performed at [facility], [address] [place], [place] [address]    @[email or web address] @  Blood pressure (!) 152/81, pulse 92, temperature 98.1 F (36.7 C), resp. rate 20, SpO2 97 %.    Assessment/Plan Problem  Vertigo  Essential Hypertension  Gerd (Gastroesophageal Reflux Disease)  Pvc (Premature Ventricular Contraction)   Vertigo: The patient will be admitted to a medical bed under observation status. She will receive Valium as this maybe helpful in resetting the vestibular system. She will also receive meclizine. PT/OT have been consulted as well.  Essential hypertension: The patient will be continued on her home medications as possible.  GERD: She will continue on a PPI as at home.  PVC: Noted. None on EKG today.  I have seen and examined this patient myself. I have spent 70 minutes in here evaluation and care.  Keelin Sheridan 10/30/2018, 8:12 PM

## 2018-10-30 NOTE — ED Notes (Signed)
Date and time results received: 10/30/18 7:39 AM  (use smartphrase ".now" to insert current time)  Test: Potassium Critical Value: 2.7  Name of Provider Notified: Freida Busman  Orders Received? Or Actions Taken?: awaiting orders

## 2018-10-30 NOTE — ED Notes (Signed)
Assisted patient to bedside commode and back to stretcher. Pt tolerated it well.

## 2018-10-30 NOTE — ED Notes (Signed)
Bed: WA06 Expected date:  Expected time:  Means of arrival:  Comments: 58 yo F/ weakness

## 2018-10-31 DIAGNOSIS — R42 Dizziness and giddiness: Secondary | ICD-10-CM | POA: Diagnosis not present

## 2018-10-31 DIAGNOSIS — I10 Essential (primary) hypertension: Secondary | ICD-10-CM | POA: Diagnosis not present

## 2018-10-31 LAB — BASIC METABOLIC PANEL
Anion gap: 12 (ref 5–15)
BUN: 9 mg/dL (ref 6–20)
CO2: 25 mmol/L (ref 22–32)
Calcium: 8.8 mg/dL — ABNORMAL LOW (ref 8.9–10.3)
Chloride: 104 mmol/L (ref 98–111)
Creatinine, Ser: 0.95 mg/dL (ref 0.44–1.00)
GFR calc Af Amer: >60 mL/min (ref >60)
GFR calc non Af Amer: >60 mL/min (ref >60)
Glucose, Bld: 124 mg/dL — ABNORMAL HIGH (ref 70–99)
POTASSIUM: 3.6 mmol/L (ref 3.5–5.1)
Sodium: 141 mmol/L (ref 135–145)

## 2018-10-31 LAB — CBC
HCT: 35.1 % — ABNORMAL LOW (ref 36.0–46.0)
Hemoglobin: 11.3 g/dL — ABNORMAL LOW (ref 12.0–15.0)
MCH: 27.5 pg (ref 26.0–34.0)
MCHC: 32.2 g/dL (ref 30.0–36.0)
MCV: 85.4 fL (ref 80.0–100.0)
Platelets: 362 10*3/uL (ref 150–400)
RBC: 4.11 MIL/uL (ref 3.87–5.11)
RDW: 15.2 % (ref 11.5–15.5)
WBC: 13.1 10*3/uL — ABNORMAL HIGH (ref 4.0–10.5)
nRBC: 0 % (ref 0.0–0.2)

## 2018-10-31 LAB — HIV ANTIBODY (ROUTINE TESTING W REFLEX): HIV Screen 4th Generation wRfx: NONREACTIVE

## 2018-10-31 NOTE — Discharge Summary (Signed)
Physician Discharge Summary  Andrea Robertson HKV:[id number] DOB: [birth year] DOA: 10/30/2018  PCP: Lorenda Ishihara, MD  Admit date: 10/30/2018 Discharge date: 10/31/2018  Admitted From: Home Disposition:  home  Recommendations for Outpatient Follow-up:  1. Follow up with PCP in 1-2 weeks  Discharge Condition:Improved CODE STATUS:Full Diet recommendation: Regular   Brief/Interim Summary: 58 yr old woman who is currently obtunded due to the administration of IV lorazepam. Her husband is at bedside and gives me the history. He states that her dizziness began last night when the patient was not able to stand from a chair due to dizziness. It became much worse this morning and has been accompanied by nausea, but no nausea. She has recently undergone surgery on her right shoulder. There is some concern that her vertigo is due to anesthesia.  The patient carries a past medical history significant for arthritis, depression, GERD, sinus headaches, hypertension, and PVC's.  In the ED the patient's had a temperature of 98.1. Respiratory rate was 20. Heart rate was 83. Blood pressure was 158/91. She was saturating at 97% on room air.  Sodium was 138. Potassium was 2.7. Chloride was 103. CO2 was 26. BUN was 15. Creatinine was 0.97. WBC was 12.6. Hemoglobin was 11.3. Hematocrit was 34.1. UA was negative.   MRI brain demonstrated mild chronic white matter changes due to microvascular ischemia.  EKG demonstrated NSR with LVH and no changes from previous tracing.  The hospitalists have been consulted to admit the patient for further evaluation and treatment.  Discharge Diagnoses:  Active Problems:   Vertigo   Essential hypertension   GERD (gastroesophageal reflux disease)   PVC (premature ventricular contraction)  Vertigo: -Pt observed overnight with improvement in symproved. -Seen by PT, cleared for D.c with possible outpt referral to vestibular PT  Essential  hypertension: -Remained stable. -Continue home meds on discharge  GERD:  -continued PPI as tolerated  Discharge Instructions   Allergies as of 10/31/2018      Reactions   Dilaudid [hydromorphone Hcl]    hives   Erythromycin Nausea And Vomiting   Penicillins Hives, Nausea And Vomiting   Has patient had a PCN reaction causing immediate rash, facial/tongue/throat swelling, SOB or lightheadedness with hypotension: No Has patient had a PCN reaction causing severe rash involving mucus membranes or skin necrosis: No Has patient had a PCN reaction that required hospitalization No Has patient had a PCN reaction occurring within the last 10 years: Yes If all of the above answers are "NO", then may proceed with Cephalosporin use.   Percocet [oxycodone-acetaminophen] Nausea And Vomiting   Ok short term.    Zithromax [azithromycin] Nausea And Vomiting      Medication List    STOP taking these medications   aspirin EC 325 MG tablet   famotidine 20 MG tablet Commonly known as:  PEPCID   HYDROmorphone 2 MG tablet Commonly known as:  DILAUDID   methocarbamol 500 MG tablet Commonly known as:  ROBAXIN   ondansetron 4 MG disintegrating tablet Commonly known as:  Zofran ODT   potassium chloride 10 MEQ tablet Commonly known as:  K-DUR   traMADol 50 MG tablet Commonly known as:  ULTRAM     TAKE these medications   acetaminophen 500 MG tablet Commonly known as:  TYLENOL Take 1,000 mg by mouth every 6 (six) hours as needed for mild pain or headache.   amLODipine 10 MG tablet Commonly known as:  NORVASC Take 10 mg by mouth daily.   buPROPion 300 MG  24 hr tablet Commonly known as:  WELLBUTRIN XL Take 300 mg by mouth daily.   escitalopram 10 MG tablet Commonly known as:  LEXAPRO Take 10 mg by mouth daily.   fluticasone 50 MCG/ACT nasal spray Commonly known as:  FLONASE Place 1 spray into both nostrils daily as needed for allergies or rhinitis.   meclizine 25 MG  tablet Commonly known as:  ANTIVERT Take 1 tablet (25 mg total) by mouth 3 (three) times daily as needed for dizziness.   multivitamin with minerals Tabs tablet Take 1 tablet by mouth daily.   potassium chloride SA 20 MEQ tablet Commonly known as:  K-DUR,KLOR-CON Take 1 tablet (20 mEq total) by mouth daily.   valsartan 320 MG tablet Commonly known as:  DIOVAN Take 320 mg by mouth every morning.       Allergies  Allergen Reactions  . Dilaudid [Hydromorphone Hcl]     hives  . Erythromycin Nausea And Vomiting  . Penicillins Hives and Nausea And Vomiting    Has patient had a PCN reaction causing immediate rash, facial/tongue/throat swelling, SOB or lightheadedness with hypotension: No Has patient had a PCN reaction causing severe rash involving mucus membranes or skin necrosis: No Has patient had a PCN reaction that required hospitalization No Has patient had a PCN reaction occurring within the last 10 years: Yes If all of the above answers are "NO", then may proceed with Cephalosporin use.  Marland Kitchen Percocet [Oxycodone-Acetaminophen] Nausea And Vomiting    Ok short term.   Marland Kitchen Zithromax [Azithromycin] Nausea And Vomiting     Procedures/Studies: Mr Brain Wo Contrast (neuro Protocol)  Result Date: 10/30/2018 CLINICAL DATA:  Neuro deficit.  Nausea and diaphoresis. EXAM: MRI HEAD WITHOUT CONTRAST TECHNIQUE: Multiplanar, multiecho pulse sequences of the brain and surrounding structures were obtained without intravenous contrast. COMPARISON:  None. FINDINGS: Brain: Image quality degraded by moderate motion Negative for acute infarct. Mild chronic white matter changes bilaterally likely due to chronic microvascular ischemia. Negative for hemorrhage or mass. Ventricle size and cerebral volume normal. Vascular: Normal arterial flow voids Skull and upper cervical spine: Negative Sinuses/Orbits: Mild mucosal edema paranasal sinuses.  Normal orbit Other: None IMPRESSION: Motion degraded study No acute  abnormality Mild chronic white matter changes likely due to microvascular ischemia. Electronically Signed   By: Marlan Palau M.D.   On: 10/30/2018 11:32     Subjective: Eager to go home. Feels better  Discharge Exam: Vitals:   10/30/18 2021 10/31/18 0510  BP: 138/78 138/90  Pulse: (!) 103 89  Resp: 18 18  Temp: 98.9 F (37.2 C) 98.5 F (36.9 C)  SpO2: 100% 100%   Vitals:   10/30/18 1600 10/30/18 1712 10/30/18 2021 10/31/18 0510  BP: (!) 156/86 (!) 152/81 138/78 138/90  Pulse: 84 92 (!) 103 89  Resp:  20 18 18   Temp:  98.1 F (36.7 C) 98.9 F (37.2 C) 98.5 F (36.9 C)  TempSrc:   Oral Oral  SpO2: 96% 97% 100% 100%    General: Pt is alert, awake, not in acute distress Cardiovascular: RRR, S1/S2 +, no rubs, no gallops Respiratory: CTA bilaterally, no wheezing, no rhonchi Abdominal: Soft, NT, ND, bowel sounds + Extremities: no edema, no cyanosis   The results of significant diagnostics from this hospitalization (including imaging, microbiology, ancillary and laboratory) are listed below for reference.     Microbiology: No results found for this or any previous visit (from the past 240 hour(s)).   Labs: BNP (last 3 results) No results  for input(s): BNP in the last 8760 hours. Basic Metabolic Panel: Recent Labs  Lab 10/30/18 0639 10/30/18 1420 10/31/18 0633  NA 138  --  141  K 2.7* 3.6 3.6  CL 103  --  104  CO2 26  --  25  GLUCOSE 172*  --  124*  BUN 15  --  9  CREATININE 0.97  --  0.95  CALCIUM 8.6*  --  8.8*   Liver Function Tests: No results for input(s): AST, ALT, ALKPHOS, BILITOT, PROT, ALBUMIN in the last 168 hours. No results for input(s): LIPASE, AMYLASE in the last 168 hours. No results for input(s): AMMONIA in the last 168 hours. CBC: Recent Labs  Lab 10/30/18 0639 10/31/18 0633  WBC 12.6* 13.1*  HGB 11.3* 11.3*  HCT 34.1* 35.1*  MCV 84.8 85.4  PLT 330 362   Cardiac Enzymes: No results for input(s): CKTOTAL, CKMB, CKMBINDEX,  TROPONINI in the last 168 hours. BNP: Invalid input(s): POCBNP CBG: Recent Labs  Lab 10/30/18 0645  GLUCAP 166*   D-Dimer No results for input(s): DDIMER in the last 72 hours. Hgb A1c No results for input(s): HGBA1C in the last 72 hours. Lipid Profile No results for input(s): CHOL, HDL, LDLCALC, TRIG, CHOLHDL, LDLDIRECT in the last 72 hours. Thyroid function studies No results for input(s): TSH, T4TOTAL, T3FREE, THYROIDAB in the last 72 hours.  Invalid input(s): FREET3 Anemia work up No results for input(s): VITAMINB12, FOLATE, FERRITIN, TIBC, IRON, RETICCTPCT in the last 72 hours. Urinalysis    Component Value Date/Time   COLORURINE YELLOW 10/30/2018 0734   APPEARANCEUR CLEAR 10/30/2018 0734   LABSPEC 1.010 10/30/2018 0734   PHURINE 6.0 10/30/2018 0734   GLUCOSEU NEGATIVE 10/30/2018 0734   HGBUR NEGATIVE 10/30/2018 0734   BILIRUBINUR NEGATIVE 10/30/2018 0734   KETONESUR NEGATIVE 10/30/2018 0734   PROTEINUR NEGATIVE 10/30/2018 0734   NITRITE NEGATIVE 10/30/2018 0734   LEUKOCYTESUR NEGATIVE 10/30/2018 0734   Sepsis Labs Invalid input(s): PROCALCITONIN,  WBC,  LACTICIDVEN Microbiology No results found for this or any previous visit (from the past 240 hour(s)).  Time spent: 30 min  SIGNED:   Rickey Barbara, MD  [facility] 10/31/2018, 12:38 PM  If 7PM-7AM, please contact night-coverage

## 2018-10-31 NOTE — Evaluation (Signed)
Physical Therapy Evaluation Patient Details Name: Andrea Robertson MRN: [id number] DOB: 01/16/1961 Today's Date: 10/31/2018    10/31/18 1303  Symptom Behavior  Type of Dizziness  "World moves"  Frequency of Dizziness Random per pt, unable to establish pattern  Duration of Dizziness 30 seconds-1 minute  Symptom Nature Positional  Aggravating Factors Lying supine  Relieving Factors No known relieving factors  Progression of Symptoms Better  History of similar episodes  (no)  Oculomotor Exam  Smooth Pursuits Intact  Saccades Intact  Positional Testing  Dix-Hallpike Dix-Hallpike Left (positive, room spinning and nystagmus appearing geotropic)  Dix-Hallpike Left  Dix-Hallpike Left Duration 15-20 seconds  Dix-Hallpike Left Symptoms Downbeat, left rotatory nystagmus    History of Present Illness  58 yo female admitted to ED 3/10 with dizziness. PMH includes OA with R TKR, R shouder surgery 09/2018 in sling, depression, GERD, HTN, PVCs.   Clinical Impression   Pt presents with positional dizziness, brought on by movement of head towards L in supine position. Pt's symptoms consistent with L posterior canalithiasis, treated with Epley maneuver x3 in acute setting. Pt with no further "room spinning" dizziness or nystagmus upon 3rd repositioning treatment. PT gave pt a neurorehabilitation handout in case of continued dizziness, and PT explained to call neurorehab if symptoms persist. Pt with no further acute PT needs, will d/c today.     Follow Up Recommendations Outpatient PT    Equipment Recommendations  None recommended by PT    Recommendations for Other Services       Precautions / Restrictions Precautions Precautions: None Restrictions Weight Bearing Restrictions: No      Mobility  Bed Mobility Overal bed mobility: Modified Independent             General bed mobility comments: Increased time to come to sitting, difficulty due to RUE sling.   Transfers Overall  transfer level: Independent                  Ambulation/Gait Ambulation/Gait assistance: Independent Gait Distance (Feet): 200 Feet Assistive device: None Gait Pattern/deviations: WFL(Within Functional Limits) Gait velocity: normal   General Gait Details: WFL gait, able to perform horizontal and vertical head turns with feeling "uneasy" but pt reports being nervous she will have dizziness.   Stairs Stairs: (NT - pt states she feels confident she can do stairs and does not need to practice them)          Wheelchair Mobility    Modified Rankin (Stroke Patients Only)       Balance Overall balance assessment: Independent                           High level balance activites: Direction changes;Head turns High Level Balance Comments: horizontal and vertical head turns WNL, pt reporting feeling funny but not dizzy. Able to turn 180* and stop without difficulty.              Pertinent Vitals/Pain Pain Assessment: No/denies pain    Home Living Family/patient expects to be discharged to:: Private residence Living Arrangements: Spouse/significant other Available Help at Discharge: Family;Available PRN/intermittently Type of Home: House Home Access: Level entry     Home Layout: Two level;Bed/bath upstairs Home Equipment: None      Prior Function Level of Independence: Independent               Hand Dominance   Dominant Hand: Right    Extremity/Trunk Assessment   Upper Extremity  Assessment Upper Extremity Assessment: Overall WFL for tasks assessed    Lower Extremity Assessment Lower Extremity Assessment: Overall WFL for tasks assessed    Cervical / Trunk Assessment Cervical / Trunk Assessment: Normal  Communication   Communication: No difficulties  Cognition Arousal/Alertness: Awake/alert Behavior During Therapy: WFL for tasks assessed/performed Overall Cognitive Status: Within Functional Limits for tasks assessed                                         General Comments      Exercises Other Exercises Other Exercises: L Canalithiasis Epley Manever x3 - starting with L side, waiting 30 seconds after nystagmus and symptoms ceased then turning head towards R side. Note: unable to perform R sidelying due to pt's recent R RTC repair. Pt with no symptoms on 3rd Epley treatment.    Assessment/Plan    PT Assessment All further PT needs can be met in the next venue of care  PT Problem List Decreased balance;Decreased activity tolerance       PT Treatment Interventions      PT Goals (Current goals can be found in the Care Plan section)  Acute Rehab PT Goals Patient Stated Goal: stop dizziness PT Goal Formulation: With patient/family Time For Goal Achievement: 10/31/18 Potential to Achieve Goals: Good    Frequency     Barriers to discharge        Co-evaluation               AM-PAC PT "6 Clicks" Mobility  Outcome Measure Help needed turning from your back to your side while in a flat bed without using bedrails?: A Little Help needed moving from lying on your back to sitting on the side of a flat bed without using bedrails?: None Help needed moving to and from a bed to a chair (including a wheelchair)?: None Help needed standing up from a chair using your arms (e.g., wheelchair or bedside chair)?: None Help needed to walk in hospital room?: None Help needed climbing 3-5 steps with a railing? : None 6 Click Score: 23    End of Session   Activity Tolerance: Patient tolerated treatment well Patient left: in bed;with family/visitor present;with call bell/phone within reach Nurse Communication: Mobility status PT Visit Diagnosis: Dizziness and giddiness (R42)    Time: 7412-8786 PT Time Calculation (min) (ACUTE ONLY): 35 min   Charges:   PT Evaluation $PT Eval Low Complexity: 1 Low PT Treatments $Canalith Rep Proc: 8-22 mins        Julien Girt, PT Acute Rehabilitation  Services Pager [phone]  Office [phone]   Toneka Fullen D Labresha Mellor 10/31/2018, 1:10 PM

## 2018-10-31 NOTE — TOC Transition Note (Signed)
Transition of Care [facility]) - CM/SW Discharge Note   Patient Details  Name: Andrea Robertson MRN: [id number] Date of Birth: 1960/11/01  [facility] CM/SW Contact:  Golda Acre, RN Phone Number: 10/31/2018, 12:50 PM   Clinical Narrative:    Discharged to go home orders checked for [facility]hc needs   Final next level of care: Home/Self Care Barriers to Discharge: No Barriers Identified   Patient Goals and CMS Choice Patient states their goals for this hospitalization and ongoing recovery are:: i just want to go home      Discharge Placement  home                     Discharge Plan and Services                        Social Determinants of Health (SDOH) Interventions     Readmission Risk Interventions No flowsheet data found.

## 2018-11-08 DIAGNOSIS — Z23 Encounter for immunization: Secondary | ICD-10-CM | POA: Diagnosis not present

## 2018-11-08 DIAGNOSIS — R42 Dizziness and giddiness: Secondary | ICD-10-CM | POA: Diagnosis not present

## 2018-11-08 DIAGNOSIS — N183 Chronic kidney disease, stage 3 (moderate): Secondary | ICD-10-CM | POA: Diagnosis not present

## 2018-11-08 DIAGNOSIS — Z0000 Encounter for general adult medical examination without abnormal findings: Secondary | ICD-10-CM | POA: Diagnosis not present

## 2018-11-08 DIAGNOSIS — I10 Essential (primary) hypertension: Secondary | ICD-10-CM | POA: Diagnosis not present

## 2018-11-22 DIAGNOSIS — M24111 Other articular cartilage disorders, right shoulder: Secondary | ICD-10-CM | POA: Diagnosis not present

## 2018-11-28 DIAGNOSIS — M25511 Pain in right shoulder: Secondary | ICD-10-CM | POA: Diagnosis not present

## 2018-11-28 DIAGNOSIS — M75121 Complete rotator cuff tear or rupture of right shoulder, not specified as traumatic: Secondary | ICD-10-CM | POA: Diagnosis not present

## 2018-11-28 DIAGNOSIS — M6281 Muscle weakness (generalized): Secondary | ICD-10-CM | POA: Diagnosis not present

## 2018-12-05 DIAGNOSIS — M6281 Muscle weakness (generalized): Secondary | ICD-10-CM | POA: Diagnosis not present

## 2018-12-05 DIAGNOSIS — M25511 Pain in right shoulder: Secondary | ICD-10-CM | POA: Diagnosis not present

## 2018-12-05 DIAGNOSIS — M75121 Complete rotator cuff tear or rupture of right shoulder, not specified as traumatic: Secondary | ICD-10-CM | POA: Diagnosis not present

## 2018-12-11 DIAGNOSIS — I10 Essential (primary) hypertension: Secondary | ICD-10-CM | POA: Diagnosis not present

## 2018-12-11 DIAGNOSIS — E1121 Type 2 diabetes mellitus with diabetic nephropathy: Secondary | ICD-10-CM | POA: Diagnosis not present

## 2018-12-12 DIAGNOSIS — M6281 Muscle weakness (generalized): Secondary | ICD-10-CM | POA: Diagnosis not present

## 2018-12-12 DIAGNOSIS — M25511 Pain in right shoulder: Secondary | ICD-10-CM | POA: Diagnosis not present

## 2018-12-12 DIAGNOSIS — M75121 Complete rotator cuff tear or rupture of right shoulder, not specified as traumatic: Secondary | ICD-10-CM | POA: Diagnosis not present

## 2018-12-14 DIAGNOSIS — L299 Pruritus, unspecified: Secondary | ICD-10-CM | POA: Diagnosis not present

## 2018-12-14 DIAGNOSIS — J343 Hypertrophy of nasal turbinates: Secondary | ICD-10-CM | POA: Diagnosis not present

## 2018-12-14 DIAGNOSIS — H938X3 Other specified disorders of ear, bilateral: Secondary | ICD-10-CM | POA: Diagnosis not present

## 2018-12-14 DIAGNOSIS — R609 Edema, unspecified: Secondary | ICD-10-CM | POA: Insufficient documentation

## 2018-12-14 DIAGNOSIS — K118 Other diseases of salivary glands: Secondary | ICD-10-CM | POA: Diagnosis not present

## 2018-12-17 DIAGNOSIS — M6281 Muscle weakness (generalized): Secondary | ICD-10-CM | POA: Diagnosis not present

## 2018-12-17 DIAGNOSIS — M25511 Pain in right shoulder: Secondary | ICD-10-CM | POA: Diagnosis not present

## 2018-12-17 DIAGNOSIS — M75121 Complete rotator cuff tear or rupture of right shoulder, not specified as traumatic: Secondary | ICD-10-CM | POA: Diagnosis not present

## 2018-12-19 DIAGNOSIS — M6281 Muscle weakness (generalized): Secondary | ICD-10-CM | POA: Diagnosis not present

## 2018-12-19 DIAGNOSIS — M25511 Pain in right shoulder: Secondary | ICD-10-CM | POA: Diagnosis not present

## 2018-12-19 DIAGNOSIS — M75121 Complete rotator cuff tear or rupture of right shoulder, not specified as traumatic: Secondary | ICD-10-CM | POA: Diagnosis not present

## 2018-12-25 DIAGNOSIS — M6281 Muscle weakness (generalized): Secondary | ICD-10-CM | POA: Diagnosis not present

## 2018-12-25 DIAGNOSIS — M75121 Complete rotator cuff tear or rupture of right shoulder, not specified as traumatic: Secondary | ICD-10-CM | POA: Diagnosis not present

## 2018-12-25 DIAGNOSIS — M25511 Pain in right shoulder: Secondary | ICD-10-CM | POA: Diagnosis not present

## 2018-12-27 DIAGNOSIS — M75121 Complete rotator cuff tear or rupture of right shoulder, not specified as traumatic: Secondary | ICD-10-CM | POA: Diagnosis not present

## 2018-12-27 DIAGNOSIS — M6281 Muscle weakness (generalized): Secondary | ICD-10-CM | POA: Diagnosis not present

## 2018-12-27 DIAGNOSIS — M25511 Pain in right shoulder: Secondary | ICD-10-CM | POA: Diagnosis not present

## 2019-01-01 DIAGNOSIS — M25511 Pain in right shoulder: Secondary | ICD-10-CM | POA: Diagnosis not present

## 2019-01-01 DIAGNOSIS — M75121 Complete rotator cuff tear or rupture of right shoulder, not specified as traumatic: Secondary | ICD-10-CM | POA: Diagnosis not present

## 2019-01-01 DIAGNOSIS — M6281 Muscle weakness (generalized): Secondary | ICD-10-CM | POA: Diagnosis not present

## 2019-01-02 DIAGNOSIS — I10 Essential (primary) hypertension: Secondary | ICD-10-CM | POA: Diagnosis not present

## 2019-01-02 DIAGNOSIS — J019 Acute sinusitis, unspecified: Secondary | ICD-10-CM | POA: Diagnosis not present

## 2019-01-02 DIAGNOSIS — B9689 Other specified bacterial agents as the cause of diseases classified elsewhere: Secondary | ICD-10-CM | POA: Diagnosis not present

## 2019-01-03 DIAGNOSIS — M1712 Unilateral primary osteoarthritis, left knee: Secondary | ICD-10-CM | POA: Diagnosis not present

## 2019-01-08 DIAGNOSIS — M6281 Muscle weakness (generalized): Secondary | ICD-10-CM | POA: Diagnosis not present

## 2019-01-08 DIAGNOSIS — M75121 Complete rotator cuff tear or rupture of right shoulder, not specified as traumatic: Secondary | ICD-10-CM | POA: Diagnosis not present

## 2019-01-08 DIAGNOSIS — M25511 Pain in right shoulder: Secondary | ICD-10-CM | POA: Diagnosis not present

## 2019-01-10 DIAGNOSIS — M75121 Complete rotator cuff tear or rupture of right shoulder, not specified as traumatic: Secondary | ICD-10-CM | POA: Diagnosis not present

## 2019-01-10 DIAGNOSIS — M25511 Pain in right shoulder: Secondary | ICD-10-CM | POA: Diagnosis not present

## 2019-01-10 DIAGNOSIS — M6281 Muscle weakness (generalized): Secondary | ICD-10-CM | POA: Diagnosis not present

## 2019-01-15 DIAGNOSIS — M75121 Complete rotator cuff tear or rupture of right shoulder, not specified as traumatic: Secondary | ICD-10-CM | POA: Diagnosis not present

## 2019-01-15 DIAGNOSIS — M25511 Pain in right shoulder: Secondary | ICD-10-CM | POA: Diagnosis not present

## 2019-01-15 DIAGNOSIS — M6281 Muscle weakness (generalized): Secondary | ICD-10-CM | POA: Diagnosis not present

## 2019-01-17 DIAGNOSIS — M6281 Muscle weakness (generalized): Secondary | ICD-10-CM | POA: Diagnosis not present

## 2019-01-17 DIAGNOSIS — M75121 Complete rotator cuff tear or rupture of right shoulder, not specified as traumatic: Secondary | ICD-10-CM | POA: Diagnosis not present

## 2019-01-17 DIAGNOSIS — M25511 Pain in right shoulder: Secondary | ICD-10-CM | POA: Diagnosis not present

## 2019-01-23 DIAGNOSIS — M25511 Pain in right shoulder: Secondary | ICD-10-CM | POA: Diagnosis not present

## 2019-01-23 DIAGNOSIS — M75121 Complete rotator cuff tear or rupture of right shoulder, not specified as traumatic: Secondary | ICD-10-CM | POA: Diagnosis not present

## 2019-01-23 DIAGNOSIS — M6281 Muscle weakness (generalized): Secondary | ICD-10-CM | POA: Diagnosis not present

## 2019-01-30 DIAGNOSIS — M6281 Muscle weakness (generalized): Secondary | ICD-10-CM | POA: Diagnosis not present

## 2019-01-30 DIAGNOSIS — M75121 Complete rotator cuff tear or rupture of right shoulder, not specified as traumatic: Secondary | ICD-10-CM | POA: Diagnosis not present

## 2019-01-30 DIAGNOSIS — M25511 Pain in right shoulder: Secondary | ICD-10-CM | POA: Diagnosis not present

## 2019-02-04 DIAGNOSIS — M25511 Pain in right shoulder: Secondary | ICD-10-CM | POA: Diagnosis not present

## 2019-02-04 DIAGNOSIS — M6281 Muscle weakness (generalized): Secondary | ICD-10-CM | POA: Diagnosis not present

## 2019-02-04 DIAGNOSIS — M75121 Complete rotator cuff tear or rupture of right shoulder, not specified as traumatic: Secondary | ICD-10-CM | POA: Diagnosis not present

## 2019-02-06 DIAGNOSIS — M6281 Muscle weakness (generalized): Secondary | ICD-10-CM | POA: Diagnosis not present

## 2019-02-06 DIAGNOSIS — M25511 Pain in right shoulder: Secondary | ICD-10-CM | POA: Diagnosis not present

## 2019-02-06 DIAGNOSIS — M75121 Complete rotator cuff tear or rupture of right shoulder, not specified as traumatic: Secondary | ICD-10-CM | POA: Diagnosis not present

## 2019-02-08 DIAGNOSIS — Z20828 Contact with and (suspected) exposure to other viral communicable diseases: Secondary | ICD-10-CM | POA: Diagnosis not present

## 2019-02-14 DIAGNOSIS — M25511 Pain in right shoulder: Secondary | ICD-10-CM | POA: Diagnosis not present

## 2019-02-21 DIAGNOSIS — H8113 Benign paroxysmal vertigo, bilateral: Secondary | ICD-10-CM | POA: Diagnosis not present

## 2019-02-26 DIAGNOSIS — R2681 Unsteadiness on feet: Secondary | ICD-10-CM | POA: Diagnosis not present

## 2019-02-26 DIAGNOSIS — R42 Dizziness and giddiness: Secondary | ICD-10-CM | POA: Diagnosis not present

## 2019-03-01 DIAGNOSIS — R42 Dizziness and giddiness: Secondary | ICD-10-CM | POA: Diagnosis not present

## 2019-03-01 DIAGNOSIS — R2681 Unsteadiness on feet: Secondary | ICD-10-CM | POA: Diagnosis not present

## 2019-03-05 DIAGNOSIS — R42 Dizziness and giddiness: Secondary | ICD-10-CM | POA: Diagnosis not present

## 2019-03-05 DIAGNOSIS — R2681 Unsteadiness on feet: Secondary | ICD-10-CM | POA: Diagnosis not present

## 2019-03-12 DIAGNOSIS — M6281 Muscle weakness (generalized): Secondary | ICD-10-CM | POA: Diagnosis not present

## 2019-03-12 DIAGNOSIS — M25611 Stiffness of right shoulder, not elsewhere classified: Secondary | ICD-10-CM | POA: Diagnosis not present

## 2019-03-12 DIAGNOSIS — M25511 Pain in right shoulder: Secondary | ICD-10-CM | POA: Diagnosis not present

## 2019-03-14 DIAGNOSIS — M6281 Muscle weakness (generalized): Secondary | ICD-10-CM | POA: Diagnosis not present

## 2019-03-14 DIAGNOSIS — M25611 Stiffness of right shoulder, not elsewhere classified: Secondary | ICD-10-CM | POA: Diagnosis not present

## 2019-03-14 DIAGNOSIS — M25511 Pain in right shoulder: Secondary | ICD-10-CM | POA: Diagnosis not present

## 2019-03-19 DIAGNOSIS — M6281 Muscle weakness (generalized): Secondary | ICD-10-CM | POA: Diagnosis not present

## 2019-03-19 DIAGNOSIS — M25511 Pain in right shoulder: Secondary | ICD-10-CM | POA: Diagnosis not present

## 2019-03-19 DIAGNOSIS — M25611 Stiffness of right shoulder, not elsewhere classified: Secondary | ICD-10-CM | POA: Diagnosis not present

## 2019-03-22 DIAGNOSIS — M6281 Muscle weakness (generalized): Secondary | ICD-10-CM | POA: Diagnosis not present

## 2019-03-22 DIAGNOSIS — M25611 Stiffness of right shoulder, not elsewhere classified: Secondary | ICD-10-CM | POA: Diagnosis not present

## 2019-03-22 DIAGNOSIS — M25511 Pain in right shoulder: Secondary | ICD-10-CM | POA: Diagnosis not present

## 2019-03-26 DIAGNOSIS — M25611 Stiffness of right shoulder, not elsewhere classified: Secondary | ICD-10-CM | POA: Diagnosis not present

## 2019-03-26 DIAGNOSIS — M25511 Pain in right shoulder: Secondary | ICD-10-CM | POA: Diagnosis not present

## 2019-03-26 DIAGNOSIS — M6281 Muscle weakness (generalized): Secondary | ICD-10-CM | POA: Diagnosis not present

## 2019-04-01 DIAGNOSIS — M6281 Muscle weakness (generalized): Secondary | ICD-10-CM | POA: Diagnosis not present

## 2019-04-01 DIAGNOSIS — M25511 Pain in right shoulder: Secondary | ICD-10-CM | POA: Diagnosis not present

## 2019-04-01 DIAGNOSIS — M25611 Stiffness of right shoulder, not elsewhere classified: Secondary | ICD-10-CM | POA: Diagnosis not present

## 2019-04-04 DIAGNOSIS — M25511 Pain in right shoulder: Secondary | ICD-10-CM | POA: Diagnosis not present

## 2019-04-04 DIAGNOSIS — M25611 Stiffness of right shoulder, not elsewhere classified: Secondary | ICD-10-CM | POA: Diagnosis not present

## 2019-04-04 DIAGNOSIS — M6281 Muscle weakness (generalized): Secondary | ICD-10-CM | POA: Diagnosis not present

## 2019-04-08 DIAGNOSIS — M25511 Pain in right shoulder: Secondary | ICD-10-CM | POA: Diagnosis not present

## 2019-04-08 DIAGNOSIS — M6281 Muscle weakness (generalized): Secondary | ICD-10-CM | POA: Diagnosis not present

## 2019-04-08 DIAGNOSIS — M25611 Stiffness of right shoulder, not elsewhere classified: Secondary | ICD-10-CM | POA: Diagnosis not present

## 2019-04-12 DIAGNOSIS — M6281 Muscle weakness (generalized): Secondary | ICD-10-CM | POA: Diagnosis not present

## 2019-04-12 DIAGNOSIS — M25611 Stiffness of right shoulder, not elsewhere classified: Secondary | ICD-10-CM | POA: Diagnosis not present

## 2019-04-12 DIAGNOSIS — M25511 Pain in right shoulder: Secondary | ICD-10-CM | POA: Diagnosis not present

## 2019-05-13 DIAGNOSIS — F3341 Major depressive disorder, recurrent, in partial remission: Secondary | ICD-10-CM | POA: Diagnosis not present

## 2019-05-13 DIAGNOSIS — I10 Essential (primary) hypertension: Secondary | ICD-10-CM | POA: Diagnosis not present

## 2019-05-13 DIAGNOSIS — E1121 Type 2 diabetes mellitus with diabetic nephropathy: Secondary | ICD-10-CM | POA: Diagnosis not present

## 2019-05-13 DIAGNOSIS — N183 Chronic kidney disease, stage 3 (moderate): Secondary | ICD-10-CM | POA: Diagnosis not present

## 2019-05-13 DIAGNOSIS — Z23 Encounter for immunization: Secondary | ICD-10-CM | POA: Diagnosis not present

## 2019-07-03 ENCOUNTER — Observation Stay (HOSPITAL_COMMUNITY): Payer: BC Managed Care – PPO

## 2019-07-03 ENCOUNTER — Encounter (HOSPITAL_COMMUNITY): Payer: Self-pay | Admitting: Emergency Medicine

## 2019-07-03 ENCOUNTER — Inpatient Hospital Stay (HOSPITAL_COMMUNITY)
Admission: EM | Admit: 2019-07-03 | Discharge: 2019-07-04 | DRG: 247 | Disposition: A | Discharge status: Home / Self Care | Payer: BC Managed Care – PPO | Attending: Cardiology | Admitting: Cardiology

## 2019-07-03 ENCOUNTER — Other Ambulatory Visit: Payer: Self-pay

## 2019-07-03 ENCOUNTER — Emergency Department (HOSPITAL_COMMUNITY): Payer: BC Managed Care – PPO

## 2019-07-03 ENCOUNTER — Encounter (HOSPITAL_COMMUNITY)
Admission: EM | Disposition: A | Discharge status: Home / Self Care | Payer: Self-pay | Source: Home / Self Care | Attending: Cardiology

## 2019-07-03 DIAGNOSIS — E119 Type 2 diabetes mellitus without complications: Secondary | ICD-10-CM | POA: Diagnosis present

## 2019-07-03 DIAGNOSIS — E785 Hyperlipidemia, unspecified: Secondary | ICD-10-CM

## 2019-07-03 DIAGNOSIS — I200 Unstable angina: Secondary | ICD-10-CM | POA: Diagnosis not present

## 2019-07-03 DIAGNOSIS — I214 Non-ST elevation (NSTEMI) myocardial infarction: Principal | ICD-10-CM | POA: Diagnosis present

## 2019-07-03 DIAGNOSIS — Z7951 Long term (current) use of inhaled steroids: Secondary | ICD-10-CM

## 2019-07-03 DIAGNOSIS — Z8249 Family history of ischemic heart disease and other diseases of the circulatory system: Secondary | ICD-10-CM

## 2019-07-03 DIAGNOSIS — Z885 Allergy status to narcotic agent status: Secondary | ICD-10-CM | POA: Diagnosis not present

## 2019-07-03 DIAGNOSIS — I252 Old myocardial infarction: Secondary | ICD-10-CM | POA: Diagnosis present

## 2019-07-03 DIAGNOSIS — R079 Chest pain, unspecified: Secondary | ICD-10-CM | POA: Diagnosis present

## 2019-07-03 DIAGNOSIS — Z79899 Other long term (current) drug therapy: Secondary | ICD-10-CM

## 2019-07-03 DIAGNOSIS — Z880 Allergy status to penicillin: Secondary | ICD-10-CM

## 2019-07-03 DIAGNOSIS — Z23 Encounter for immunization: Secondary | ICD-10-CM

## 2019-07-03 DIAGNOSIS — K219 Gastro-esophageal reflux disease without esophagitis: Secondary | ICD-10-CM | POA: Diagnosis not present

## 2019-07-03 DIAGNOSIS — Z20828 Contact with and (suspected) exposure to other viral communicable diseases: Secondary | ICD-10-CM | POA: Diagnosis present

## 2019-07-03 DIAGNOSIS — E1169 Type 2 diabetes mellitus with other specified complication: Secondary | ICD-10-CM

## 2019-07-03 DIAGNOSIS — I10 Essential (primary) hypertension: Secondary | ICD-10-CM | POA: Diagnosis not present

## 2019-07-03 DIAGNOSIS — I2510 Atherosclerotic heart disease of native coronary artery without angina pectoris: Secondary | ICD-10-CM | POA: Diagnosis not present

## 2019-07-03 DIAGNOSIS — I25119 Atherosclerotic heart disease of native coronary artery with unspecified angina pectoris: Secondary | ICD-10-CM | POA: Diagnosis present

## 2019-07-03 DIAGNOSIS — R000 Tachycardia, unspecified: Secondary | ICD-10-CM | POA: Diagnosis not present

## 2019-07-03 DIAGNOSIS — Z955 Presence of coronary angioplasty implant and graft: Secondary | ICD-10-CM

## 2019-07-03 DIAGNOSIS — N179 Acute kidney failure, unspecified: Secondary | ICD-10-CM | POA: Diagnosis present

## 2019-07-03 DIAGNOSIS — Z90710 Acquired absence of both cervix and uterus: Secondary | ICD-10-CM | POA: Diagnosis not present

## 2019-07-03 DIAGNOSIS — Z881 Allergy status to other antibiotic agents status: Secondary | ICD-10-CM | POA: Diagnosis not present

## 2019-07-03 DIAGNOSIS — I213 ST elevation (STEMI) myocardial infarction of unspecified site: Secondary | ICD-10-CM | POA: Diagnosis not present

## 2019-07-03 DIAGNOSIS — R072 Precordial pain: Secondary | ICD-10-CM | POA: Diagnosis not present

## 2019-07-03 DIAGNOSIS — I25118 Atherosclerotic heart disease of native coronary artery with other forms of angina pectoris: Secondary | ICD-10-CM

## 2019-07-03 DIAGNOSIS — Z96651 Presence of right artificial knee joint: Secondary | ICD-10-CM | POA: Diagnosis present

## 2019-07-03 DIAGNOSIS — I219 Acute myocardial infarction, unspecified: Secondary | ICD-10-CM | POA: Diagnosis present

## 2019-07-03 DIAGNOSIS — I209 Angina pectoris, unspecified: Secondary | ICD-10-CM | POA: Diagnosis present

## 2019-07-03 HISTORY — PX: LEFT HEART CATH AND CORONARY ANGIOGRAPHY: CATH118249

## 2019-07-03 HISTORY — DX: Type 2 diabetes mellitus without complications: E11.9

## 2019-07-03 HISTORY — PX: CORONARY STENT INTERVENTION: CATH118234

## 2019-07-03 LAB — GLUCOSE, CAPILLARY
Glucose-Capillary: 110 mg/dL — ABNORMAL HIGH (ref 70–99)
Glucose-Capillary: 160 mg/dL — ABNORMAL HIGH (ref 70–99)

## 2019-07-03 LAB — BASIC METABOLIC PANEL
Anion gap: 13 (ref 5–15)
Anion gap: 9 (ref 5–15)
BUN: 22 mg/dL — ABNORMAL HIGH (ref 6–20)
BUN: 20 mg/dL (ref 6–20)
CO2: 24 mmol/L (ref 22–32)
CO2: 24 mmol/L (ref 22–32)
Calcium: 10.3 mg/dL (ref 8.9–10.3)
Calcium: 9.7 mg/dL (ref 8.9–10.3)
Chloride: 98 mmol/L (ref 98–111)
Chloride: 104 mmol/L (ref 98–111)
Creatinine, Ser: 1.71 mg/dL — ABNORMAL HIGH (ref 0.44–1.00)
Creatinine, Ser: 1.62 mg/dL — ABNORMAL HIGH (ref 0.44–1.00)
GFR calc Af Amer: 38 mL/min — ABNORMAL LOW (ref >60)
GFR calc Af Amer: 41 mL/min — ABNORMAL LOW (ref >60)
GFR calc non Af Amer: 33 mL/min — ABNORMAL LOW (ref >60)
GFR calc non Af Amer: 35 mL/min — ABNORMAL LOW (ref >60)
Glucose, Bld: 208 mg/dL — ABNORMAL HIGH (ref 70–99)
Glucose, Bld: 121 mg/dL — ABNORMAL HIGH (ref 70–99)
Potassium: 4.7 mmol/L (ref 3.5–5.1)
Potassium: 5.3 mmol/L — ABNORMAL HIGH (ref 3.5–5.1)
Sodium: 135 mmol/L (ref 135–145)
Sodium: 137 mmol/L (ref 135–145)

## 2019-07-03 LAB — CBC
HCT: 35.8 % — ABNORMAL LOW (ref 36.0–46.0)
HCT: 31.6 % — ABNORMAL LOW (ref 36.0–46.0)
Hemoglobin: 12 g/dL (ref 12.0–15.0)
Hemoglobin: 10.5 g/dL — ABNORMAL LOW (ref 12.0–15.0)
MCH: 28.4 pg (ref 26.0–34.0)
MCH: 28.5 pg (ref 26.0–34.0)
MCHC: 33.5 g/dL (ref 30.0–36.0)
MCHC: 33.2 g/dL (ref 30.0–36.0)
MCV: 84.8 fL (ref 80.0–100.0)
MCV: 85.9 fL (ref 80.0–100.0)
Platelets: 331 10*3/uL (ref 150–400)
Platelets: 319 10*3/uL (ref 150–400)
RBC: 4.22 MIL/uL (ref 3.87–5.11)
RBC: 3.68 MIL/uL — ABNORMAL LOW (ref 3.87–5.11)
RDW: 13.9 % (ref 11.5–15.5)
RDW: 14 % (ref 11.5–15.5)
WBC: 18.3 10*3/uL — ABNORMAL HIGH (ref 4.0–10.5)
WBC: 19.2 10*3/uL — ABNORMAL HIGH (ref 4.0–10.5)
nRBC: 0 % (ref 0.0–0.2)
nRBC: 0 % (ref 0.0–0.2)

## 2019-07-03 LAB — ECHOCARDIOGRAM COMPLETE

## 2019-07-03 LAB — C-REACTIVE PROTEIN: CRP: 1 mg/dL — ABNORMAL HIGH (ref <1.0)

## 2019-07-03 LAB — TROPONIN I (HIGH SENSITIVITY)
Troponin I (High Sensitivity): 1275 ng/L (ref <18)
Troponin I (High Sensitivity): 2741 ng/L (ref <18)
Troponin I (High Sensitivity): 22166 ng/L (ref <18)

## 2019-07-03 LAB — SEDIMENTATION RATE: Sed Rate: 43 mm/hr — ABNORMAL HIGH (ref 0–22)

## 2019-07-03 LAB — HEPARIN LEVEL (UNFRACTIONATED): Heparin Unfractionated: 0.25 IU/mL — ABNORMAL LOW (ref 0.30–0.70)

## 2019-07-03 LAB — SARS CORONAVIRUS 2 (TAT 6-24 HRS): SARS Coronavirus 2: NEGATIVE

## 2019-07-03 LAB — HEMOGLOBIN A1C
Hgb A1c MFr Bld: 7.5 % — ABNORMAL HIGH (ref 4.8–5.6)
Mean Plasma Glucose: 168.55 mg/dL

## 2019-07-03 LAB — D-DIMER, QUANTITATIVE: D-Dimer, Quant: 0.6 ug/mL-FEU — ABNORMAL HIGH (ref 0.00–0.50)

## 2019-07-03 LAB — PROTIME-INR
INR: 1 (ref 0.8–1.2)
Prothrombin Time: 12.8 seconds (ref 11.4–15.2)

## 2019-07-03 LAB — I-STAT BETA HCG BLOOD, ED (MC, WL, AP ONLY): I-stat hCG, quantitative: <5 m[IU]/mL (ref <5)

## 2019-07-03 LAB — POCT ACTIVATED CLOTTING TIME: Activated Clotting Time: 268 seconds

## 2019-07-03 SURGERY — LEFT HEART CATH AND CORONARY ANGIOGRAPHY
Anesthesia: LOCAL

## 2019-07-03 MED ORDER — NITROGLYCERIN 0.4 MG SL SUBL: 0.4000 mg | SUBLINGUAL_TABLET | SUBLINGUAL | Status: DC | PRN

## 2019-07-03 MED ORDER — LIDOCAINE HCL (PF) 1 % IJ SOLN
INTRAMUSCULAR | Status: AC
  Filled 2019-07-03: qty 30

## 2019-07-03 MED ORDER — MIDAZOLAM HCL 2 MG/2ML IJ SOLN
INTRAMUSCULAR | Status: DC | PRN
  Administered 2019-07-03 (×2): 1 mg via INTRAVENOUS

## 2019-07-03 MED ORDER — SODIUM CHLORIDE 0.9 % IV SOLN
INTRAVENOUS | Status: DC
  Administered 2019-07-03: 08:00:00 via INTRAVENOUS

## 2019-07-03 MED ORDER — ESCITALOPRAM OXALATE 10 MG PO TABS
10.0000 mg | ORAL_TABLET | Freq: Every day | ORAL | Status: DC
  Administered 2019-07-04: 09:00:00 10 mg via ORAL
  Filled 2019-07-03: qty 1

## 2019-07-03 MED ORDER — SODIUM CHLORIDE 0.9% FLUSH: 3.0000 mL | Freq: Two times a day (BID) | INTRAVENOUS | Status: DC

## 2019-07-03 MED ORDER — HEPARIN BOLUS VIA INFUSION
4000.0000 [IU] | Freq: Once | INTRAVENOUS | Status: AC
  Administered 2019-07-03: 06:00:00 4000 [IU] via INTRAVENOUS
  Filled 2019-07-03: qty 4000

## 2019-07-03 MED ORDER — NITROGLYCERIN 0.4 MG SL SUBL
0.4000 mg | SUBLINGUAL_TABLET | SUBLINGUAL | Status: AC | PRN
  Administered 2019-07-03 (×3): 0.4 mg via SUBLINGUAL
  Filled 2019-07-03 (×3): qty 1

## 2019-07-03 MED ORDER — MORPHINE SULFATE (PF) 2 MG/ML IV SOLN: 2.0000 mg | Freq: Once | INTRAVENOUS | Status: DC

## 2019-07-03 MED ORDER — FENTANYL CITRATE (PF) 100 MCG/2ML IJ SOLN
INTRAMUSCULAR | Status: AC
  Filled 2019-07-03: qty 2

## 2019-07-03 MED ORDER — ONDANSETRON HCL 4 MG/2ML IJ SOLN: 4.0000 mg | Freq: Four times a day (QID) | INTRAMUSCULAR | Status: DC | PRN

## 2019-07-03 MED ORDER — IOHEXOL 350 MG/ML SOLN
INTRAVENOUS | Status: DC | PRN
  Administered 2019-07-03: 95 mL

## 2019-07-03 MED ORDER — HEPARIN SODIUM (PORCINE) 5000 UNIT/ML IJ SOLN
5000.0000 [IU] | Freq: Three times a day (TID) | INTRAMUSCULAR | Status: DC
  Administered 2019-07-04: 04:00:00 5000 [IU] via SUBCUTANEOUS
  Filled 2019-07-03: qty 1

## 2019-07-03 MED ORDER — FENTANYL CITRATE (PF) 100 MCG/2ML IJ SOLN
50.0000 ug | Freq: Once | INTRAMUSCULAR | Status: AC
  Administered 2019-07-03: 09:00:00 50 ug via INTRAVENOUS

## 2019-07-03 MED ORDER — VERAPAMIL HCL 2.5 MG/ML IV SOLN
INTRAVENOUS | Status: AC
  Filled 2019-07-03: qty 2

## 2019-07-03 MED ORDER — LABETALOL HCL 5 MG/ML IV SOLN: 10.0000 mg | INTRAVENOUS | Status: AC | PRN

## 2019-07-03 MED ORDER — HEPARIN SODIUM (PORCINE) 1000 UNIT/ML IJ SOLN
INTRAMUSCULAR | Status: DC | PRN
  Administered 2019-07-03: 3000 [IU] via INTRAVENOUS
  Administered 2019-07-03 (×2): 4000 [IU] via INTRAVENOUS

## 2019-07-03 MED ORDER — SODIUM CHLORIDE 0.9 % IV BOLUS (SEPSIS)
1000.0000 mL | Freq: Once | INTRAVENOUS | Status: AC
  Administered 2019-07-03: 06:00:00 1000 mL via INTRAVENOUS

## 2019-07-03 MED ORDER — HEPARIN SODIUM (PORCINE) 1000 UNIT/ML IJ SOLN
INTRAMUSCULAR | Status: AC
  Filled 2019-07-03: qty 1

## 2019-07-03 MED ORDER — HEPARIN (PORCINE) IN NACL 1000-0.9 UT/500ML-% IV SOLN
INTRAVENOUS | Status: DC | PRN
  Administered 2019-07-03 (×2): 500 mL

## 2019-07-03 MED ORDER — ASPIRIN 81 MG PO CHEW: 324.0000 mg | CHEWABLE_TABLET | ORAL | Status: DC

## 2019-07-03 MED ORDER — VERAPAMIL HCL 2.5 MG/ML IV SOLN
INTRAVENOUS | Status: DC | PRN
  Administered 2019-07-03: 10 mL via INTRA_ARTERIAL

## 2019-07-03 MED ORDER — NITROGLYCERIN 1 MG/10 ML FOR IR/CATH LAB
INTRA_ARTERIAL | Status: AC
  Filled 2019-07-03: qty 10

## 2019-07-03 MED ORDER — ACETAMINOPHEN 325 MG PO TABS: 650.0000 mg | ORAL_TABLET | ORAL | Status: DC | PRN

## 2019-07-03 MED ORDER — ASPIRIN EC 81 MG PO TBEC: 81.0000 mg | DELAYED_RELEASE_TABLET | Freq: Every day | ORAL | Status: DC

## 2019-07-03 MED ORDER — ASPIRIN EC 81 MG PO TBEC
81.0000 mg | DELAYED_RELEASE_TABLET | Freq: Every day | ORAL | Status: DC
  Administered 2019-07-04: 81 mg via ORAL
  Filled 2019-07-03 (×2): qty 1

## 2019-07-03 MED ORDER — ALPRAZOLAM 0.25 MG PO TABS: 0.2500 mg | ORAL_TABLET | Freq: Two times a day (BID) | ORAL | Status: DC | PRN

## 2019-07-03 MED ORDER — ONDANSETRON HCL 4 MG/2ML IJ SOLN
4.0000 mg | Freq: Four times a day (QID) | INTRAMUSCULAR | Status: DC | PRN
  Administered 2019-07-03 (×2): 4 mg via INTRAVENOUS
  Filled 2019-07-03 (×2): qty 2

## 2019-07-03 MED ORDER — HEPARIN (PORCINE) IN NACL 1000-0.9 UT/500ML-% IV SOLN
INTRAVENOUS | Status: AC
  Filled 2019-07-03: qty 1000

## 2019-07-03 MED ORDER — MIDAZOLAM HCL 2 MG/2ML IJ SOLN
INTRAMUSCULAR | Status: AC
  Filled 2019-07-03: qty 2

## 2019-07-03 MED ORDER — ACETAMINOPHEN 325 MG PO TABS
650.0000 mg | ORAL_TABLET | ORAL | Status: DC | PRN
  Administered 2019-07-04: 04:00:00 650 mg via ORAL
  Filled 2019-07-03: qty 2

## 2019-07-03 MED ORDER — ASPIRIN 81 MG PO CHEW
324.0000 mg | CHEWABLE_TABLET | Freq: Once | ORAL | Status: AC
  Administered 2019-07-03: 06:00:00 324 mg via ORAL
  Filled 2019-07-03: qty 4

## 2019-07-03 MED ORDER — INSULIN ASPART 100 UNIT/ML ~~LOC~~ SOLN
0.0000 [IU] | Freq: Three times a day (TID) | SUBCUTANEOUS | Status: DC
  Administered 2019-07-04: 09:00:00 3 [IU] via SUBCUTANEOUS

## 2019-07-03 MED ORDER — SODIUM CHLORIDE 0.9% FLUSH
3.0000 mL | Freq: Two times a day (BID) | INTRAVENOUS | Status: DC
  Administered 2019-07-04: 02:00:00 3 mL via INTRAVENOUS

## 2019-07-03 MED ORDER — ONDANSETRON HCL 4 MG/2ML IJ SOLN
4.0000 mg | Freq: Once | INTRAMUSCULAR | Status: AC
  Administered 2019-07-03: 06:00:00 4 mg via INTRAVENOUS
  Filled 2019-07-03: qty 2

## 2019-07-03 MED ORDER — SODIUM CHLORIDE 0.9% FLUSH
3.0000 mL | Freq: Once | INTRAVENOUS | Status: AC
  Administered 2019-07-03: 3 mL via INTRAVENOUS

## 2019-07-03 MED ORDER — LIDOCAINE HCL (PF) 1 % IJ SOLN
INTRAMUSCULAR | Status: DC | PRN
  Administered 2019-07-03: 2 mL

## 2019-07-03 MED ORDER — NITROGLYCERIN IN D5W 200-5 MCG/ML-% IV SOLN
0.0000 ug/min | INTRAVENOUS | Status: DC
  Administered 2019-07-03: 06:00:00 5 ug/min via INTRAVENOUS
  Filled 2019-07-03: qty 250

## 2019-07-03 MED ORDER — SODIUM CHLORIDE 0.9 % IV SOLN: 250.0000 mL | INTRAVENOUS | Status: DC | PRN

## 2019-07-03 MED ORDER — SODIUM CHLORIDE 0.9 % WEIGHT BASED INFUSION
1.0000 mL/kg/h | INTRAVENOUS | Status: AC
  Administered 2019-07-03: 1 mL/kg/h via INTRAVENOUS

## 2019-07-03 MED ORDER — SODIUM CHLORIDE 0.9% FLUSH: 3.0000 mL | INTRAVENOUS | Status: DC | PRN

## 2019-07-03 MED ORDER — ASPIRIN 300 MG RE SUPP: 300.0000 mg | RECTAL | Status: DC

## 2019-07-03 MED ORDER — NITROGLYCERIN 1 MG/10 ML FOR IR/CATH LAB
INTRA_ARTERIAL | Status: DC | PRN
  Administered 2019-07-03: 150 ug via INTRACORONARY

## 2019-07-03 MED ORDER — ZOLPIDEM TARTRATE 5 MG PO TABS: 5.0000 mg | ORAL_TABLET | Freq: Every evening | ORAL | Status: DC | PRN

## 2019-07-03 MED ORDER — FENTANYL CITRATE (PF) 100 MCG/2ML IJ SOLN
INTRAMUSCULAR | Status: DC | PRN
  Administered 2019-07-03 (×2): 25 ug via INTRAVENOUS

## 2019-07-03 MED ORDER — HEPARIN (PORCINE) 25000 UT/250ML-% IV SOLN
1000.0000 [IU]/h | INTRAVENOUS | Status: DC
  Administered 2019-07-03: 06:00:00 1000 [IU]/h via INTRAVENOUS
  Filled 2019-07-03: qty 250

## 2019-07-03 MED ORDER — ASPIRIN 81 MG PO CHEW
324.0000 mg | CHEWABLE_TABLET | Freq: Once | ORAL | Status: AC
  Administered 2019-07-03: 05:00:00 324 mg via ORAL
  Filled 2019-07-03: qty 4

## 2019-07-03 MED ORDER — TICAGRELOR 90 MG PO TABS
ORAL_TABLET | ORAL | Status: DC | PRN
  Administered 2019-07-03: 180 mg via ORAL

## 2019-07-03 MED ORDER — BUPROPION HCL ER (XL) 150 MG PO TB24
300.0000 mg | ORAL_TABLET | Freq: Every day | ORAL | Status: DC
  Administered 2019-07-04: 300 mg via ORAL
  Filled 2019-07-03: qty 2

## 2019-07-03 MED ORDER — TICAGRELOR 90 MG PO TABS
90.0000 mg | ORAL_TABLET | Freq: Two times a day (BID) | ORAL | Status: DC
  Administered 2019-07-03 – 2019-07-04 (×2): 90 mg via ORAL
  Filled 2019-07-03 (×2): qty 1

## 2019-07-03 MED ORDER — VERAPAMIL HCL 2.5 MG/ML IV SOLN
INTRAVENOUS | Status: DC | PRN
  Administered 2019-07-03: 100 ug via INTRACORONARY
  Administered 2019-07-03 (×2): 200 ug via INTRACORONARY

## 2019-07-03 MED ORDER — HYDRALAZINE HCL 20 MG/ML IJ SOLN: 10.0000 mg | INTRAMUSCULAR | Status: AC | PRN

## 2019-07-03 SURGICAL SUPPLY — 20 items
BALLN SAPPHIRE 2.0X12 (BALLOONS) ×2
BALLOON SAPPHIRE 2.0X12 (BALLOONS) IMPLANT
CATH 5FR JL3.5 JR4 ANG PIG MP (CATHETERS) ×1 IMPLANT
CATH LAUNCHER 5F EBU3.0 (CATHETERS) IMPLANT
CATH LAUNCHER 5F RADL (CATHETERS) IMPLANT
CATHETER LAUNCHER 5F EBU3.0 (CATHETERS) ×2
CATHETER LAUNCHER 5F RADL (CATHETERS) ×2
DEVICE RAD COMP TR BAND LRG (VASCULAR PRODUCTS) ×1 IMPLANT
GLIDESHEATH SLEND SS 6F .021 (SHEATH) ×1 IMPLANT
GUIDEWIRE INQWIRE 1.5J.035X260 (WIRE) IMPLANT
INQWIRE 1.5J .035X260CM (WIRE) ×2
KIT ENCORE 26 ADVANTAGE (KITS) ×1 IMPLANT
KIT HEART LEFT (KITS) ×2 IMPLANT
PACK CARDIAC CATHETERIZATION (CUSTOM PROCEDURE TRAY) ×2 IMPLANT
SHEATH PROBE COVER 6X72 (BAG) ×1 IMPLANT
STENT SYNERGY DES 2.5X20 (Permanent Stent) ×1 IMPLANT
SYR MEDRAD MARK 7 150ML (SYRINGE) ×2 IMPLANT
TRANSDUCER W/STOPCOCK (MISCELLANEOUS) ×2 IMPLANT
TUBING CIL FLEX 10 FLL-RA (TUBING) ×2 IMPLANT
WIRE COUGAR XT STRL 190CM (WIRE) ×1 IMPLANT

## 2019-07-03 NOTE — Progress Notes (Signed)
The patient was seen, examined and discussed with Cadence Ninfa Meeker, PA-C   and I agree with the above.   58 y.o. female with with a pmh of diabetes, HTN, obesity, strong FHof CAD and no previous cardiac history who is being evaluated for chest pain and elevated troponin. The pain started the last night, minimal improvement with SL NTG, improvement with fentanyl but recurrence. Troponin significantly elevated 1275--> 2741, ECG shows SR, STE in the inferior leads, Q wave in the anterior leads not present in March 2020. Echo shows LVEF 50-55% with hypokinesis of the mid to apical anteroseptum, apical anterior, and apical myocardium. Labs also abnormal for elevated CRP, ARF with Crea 1.7 from baseline 0.95, WBC 18.3, Hb 12.0.  After 4 hours of hydration crea 1.6 and the patient continues to be chest pain, we will proceed with a cardiac cath. She is currently in iv Heparin, aspirin, we will add atorvastatin 80 mg po daily, no BB as she was hypotensive.   Ena Dawley, MD 07/03/2019

## 2019-07-03 NOTE — ED Notes (Signed)
Dr at bedside.

## 2019-07-03 NOTE — Progress Notes (Signed)
  Echocardiogram 2D Echocardiogram has been performed.  Lotta Frankenfield G Trystin Terhune 07/03/2019, 11:31 AM

## 2019-07-03 NOTE — Plan of Care (Signed)

## 2019-07-03 NOTE — Interval H&P Note (Signed)
Cath Lab Visit (complete for each Cath Lab visit)  Clinical Evaluation Leading to the Procedure:   ACS: Yes.    Non-ACS:    Anginal Classification: CCS IV  Anti-ischemic medical therapy: No Therapy  Non-Invasive Test Results: No non-invasive testing performed  Prior CABG: No previous CABG      History and Physical Interval Note:  07/03/2019 2:18 PM  Andrea Robertson  has presented today for surgery, with the diagnosis of nonstemi.  The various methods of treatment have been discussed with the patient and family. After consideration of risks, benefits and other options for treatment, the patient has consented to  Procedure(s): LEFT HEART CATH AND CORONARY ANGIOGRAPHY (N/A) as a surgical intervention.  The patient's history has been reviewed, patient examined, no change in status, stable for surgery.  I have reviewed the patient's chart and labs.  Questions were answered to the patient's satisfaction.     Sherren Mocha

## 2019-07-03 NOTE — H&P (View-Only) (Signed)
The patient was seen, examined and discussed with Cadence H Furth, PA-C   and I agree with the above.   57 y.o. female with with a pmh of diabetes, HTN, obesity, strong FHof CAD and no previous cardiac history who is being evaluated for chest pain and elevated troponin. The pain started the last night, minimal improvement with SL NTG, improvement with fentanyl but recurrence. Troponin significantly elevated 1275--> 2741, ECG shows SR, STE in the inferior leads, Q wave in the anterior leads not present in March 2020. Echo shows LVEF 50-55% with hypokinesis of the mid to apical anteroseptum, apical anterior, and apical myocardium. Labs also abnormal for elevated CRP, ARF with Crea 1.7 from baseline 0.95, WBC 18.3, Hb 12.0.  After 4 hours of hydration crea 1.6 and the patient continues to be chest pain, we will proceed with a cardiac cath. She is currently in iv Heparin, aspirin, we will add atorvastatin 80 mg po daily, no BB as she was hypotensive.   Donaldo Teegarden, MD 07/03/2019   

## 2019-07-03 NOTE — H&P (Addendum)
Cardiology Admission History and Physical:   Patient ID: Andrea Robertson MRN: [id number]; DOB: [birth year]   Admission date: 07/03/2019  Primary Care Provider: Lorenda Ishihara, MD Primary Cardiologist: Tobias Alexander, MD  Primary Electrophysiologist:  None   Chief Complaint:  Chest pain  Patient Profile:   Andrea Robertson is a 58 y.o. female with with a pmh of diabetes, HTN, obesity and no previous cardiac history being evaluated for chest pain.  History of Present Illness:   Andrea Robertson presented to the ER for chest pain. It started last night at 8:30 PM at rest. It is sharp in nature located in the substeranl area. Non-radiating with associated nausea and vomiting. Denies associated SOB, diaphoresis, or dizziness.   In the ED the pain resolved after 3 NTG. She was given aspirin but vomited that up. EKG showed sinus tachycardia with minimal ST elevation anteroinferior leads. Serial EKG with dynamic changes. HS troponin 1,275 > 2,741. CXR was unremarkable. Echo was ordered. Labs showed elevated creatinine 1.71 and the patient was started on IVF. Also CBC showed leukocytosis, WBC at 18.3. Patient however was afebrile and not endorsing recent illness. Pressures were stable. COVID pending.  The patient has never seen a cardiologist before and has no known history of CAD or cardiac catheterization. Reports family history positive for CAD in her father.   Heart Pathway Score:     Past Medical History:  Diagnosis Date   Arthritis    Depression    GERD (gastroesophageal reflux disease)    Headache    sinus headaches   Hypertension    PVC's (premature ventricular contractions)     Past Surgical History:  Procedure Laterality Date   ABDOMINAL HYSTERECTOMY     CHOLECYSTECTOMY     TOTAL KNEE ARTHROPLASTY Right 10/13/2015   Procedure: RIGHT TOTAL KNEE ARTHROPLASTY;  Surgeon: Ranee Gosselin, MD;  Location: [facility];  Service: Orthopedics;  Laterality: Right;      Medications Prior to Admission: Prior to Admission medications   Medication Sig Start Date End Date Taking? Authorizing Provider  acetaminophen (TYLENOL) 500 MG tablet Take 1,000 mg by mouth every 6 (six) hours as needed for mild pain or headache.    [provider]  amLODipine (NORVASC) 10 MG tablet Take 10 mg by mouth daily.    [provider]  buPROPion (WELLBUTRIN XL) 300 MG 24 hr tablet Take 300 mg by mouth daily.    [provider]  escitalopram (LEXAPRO) 10 MG tablet Take 10 mg by mouth daily.    [provider]  fluticasone (FLONASE) 50 MCG/ACT nasal spray Place 1 spray into both nostrils daily as needed for allergies or rhinitis.    [provider]  meclizine (ANTIVERT) 25 MG tablet Take 1 tablet (25 mg total) by mouth 3 (three) times daily as needed for dizziness. 10/30/18   Lorre Nick, MD  Multiple Vitamin (MULTIVITAMIN WITH MINERALS) TABS tablet Take 1 tablet by mouth daily.    [provider]  potassium chloride SA (K-DUR,KLOR-CON) 20 MEQ tablet Take 1 tablet (20 mEq total) by mouth daily. 03/06/15   Cathren Laine, MD  valsartan (DIOVAN) 320 MG tablet Take 320 mg by mouth every morning.    [provider]     Allergies:    Allergies  Allergen Reactions   Dilaudid [Hydromorphone Hcl]     hives   Erythromycin Nausea And Vomiting   Penicillins Hives and Nausea And Vomiting    Has patient had a PCN reaction causing immediate  rash, facial/tongue/throat swelling, SOB or lightheadedness with hypotension: No Has patient had a PCN reaction causing severe rash involving mucus membranes or skin necrosis: No Has patient had a PCN reaction that required hospitalization No Has patient had a PCN reaction occurring within the last 10 years: Yes If all of the above answers are "NO", then may proceed with Cephalosporin use.   Percocet [Oxycodone-Acetaminophen] Nausea And Vomiting    Ok short term.    Zithromax  [Azithromycin] Nausea And Vomiting    Social History:   Social History   Socioeconomic History   Marital status: Married    Spouse name: Not on file   Number of children: Not on file   Years of education: Not on file   Highest education level: Not on file  Occupational History   Not on file  Social Needs   Financial resource strain: Not on file   Food insecurity    Worry: Not on file    Inability: Not on file   Transportation needs    Medical: Not on file    Non-medical: Not on file  Tobacco Use   Smoking status: Never Smoker   Smokeless tobacco: Never Used  Substance and Sexual Activity   Alcohol use: Yes    Comment: Social drinker   Drug use: No   Sexual activity: Yes  Lifestyle   Physical activity    Days per week: Not on file    Minutes per session: Not on file   Stress: Not on file  Relationships   Social connections    Talks on phone: Not on file    Gets together: Not on file    Attends religious service: Not on file    Active member of club or organization: Not on file    Attends meetings of clubs or organizations: Not on file    Relationship status: Not on file   Intimate partner violence    Fear of current or ex partner: Not on file    Emotionally abused: Not on file    Physically abused: Not on file    Forced sexual activity: Not on file  Other Topics Concern   Not on file  Social History Narrative   Not on file    Family History:   The patient's family history includes CAD in her father.    ROS:  Please see the history of present illness.  All other ROS reviewed and negative.     Physical Exam/Data:   Vitals:   07/03/19 0615 07/03/19 0630 07/03/19 0700 07/03/19 0716  BP: 115/67 123/69 107/73 109/63  Pulse: 91 91 91 92  Resp: (!) 26 20 16    Temp:      TempSrc:      SpO2: 97% 96% 94% 94%   No intake or output data in the 24 hours ending 07/03/19 0756 Last 3 Weights 10/13/2015 10/13/2015 10/05/2015  Weight (lbs) 186 lb  186 lb 186 lb  Weight (kg) 84.369 kg 84.369 kg 84.369 kg     There is no height or weight on file to calculate BMI.  General:  Well nourished, well developed with active chest pain HEENT: normal Lymph: no adenopathy Neck: no JVD Endocrine:  No thryomegaly Vascular: No carotid bruits; FA pulses 2+ bilaterally without bruits  Cardiac:  normal S1, S2; RRR; no murmur  Lungs:  clear to auscultation bilaterally, no wheezing, rhonchi or rales  Abd: soft, nontender, no hepatomegaly  Ext: no edema Musculoskeletal:  No deformities, BUE and BLE  strength normal and equal Skin: warm and dry  Neuro:  CNs 2-12 intact, no focal abnormalities noted Psych:  Normal affect    EKG:  The ECG that was done today was personally reviewed and demonstrates sinus tachycardia, minimal ST elevation 11, AvF, V3-V6  Relevant CV Studies:  Echo pending  Laboratory Data:  High Sensitivity Troponin:   Recent Labs  Lab 07/03/19 0349 07/03/19 0528  TROPONINIHS 1,275* 2,741*      Chemistry Recent Labs  Lab 07/03/19 0349  NA 135  K 4.7  CL 98  CO2 24  GLUCOSE 208*  BUN 22*  CREATININE 1.71*  CALCIUM 10.3  GFRNONAA 33*  GFRAA 38*  ANIONGAP 13    No results for input(s): PROT, ALBUMIN, AST, ALT, ALKPHOS, BILITOT in the last 168 hours. Hematology Recent Labs  Lab 07/03/19 0349  WBC 18.3*  RBC 4.22  HGB 12.0  HCT 35.8*  MCV 84.8  MCH 28.4  MCHC 33.5  RDW 13.9  PLT 331   BNPNo results for input(s): BNP, PROBNP in the last 168 hours.  DDimer No results for input(s): DDIMER in the last 168 hours.   Radiology/Studies:  Dg Chest 2 View  Result Date: 07/03/2019 CLINICAL DATA:  Chest pain EXAM: CHEST - 2 VIEW COMPARISON:  11/28/2015 FINDINGS: The heart size and mediastinal contours are within normal limits. Both lungs are clear. The visualized skeletal structures are unremarkable. IMPRESSION: No active cardiopulmonary disease. Electronically Signed   By: Ulyses Jarred M.D.   On: 07/03/2019  03:57    Assessment and Plan:   NSTEMI Patient with positive family histroy of CAD and multiple risk factors presented to the ER with substernal chest pain with associated nausea and vomiting relieved with NTG. EKG wth dynamic changes, minimal  ST elevation in anterior/inferior leads. HS troponin peaked 2,741. Also with leukocytosis. With elevated creatinine was started on IVF - Patient is currently chest pain free - She will likely need cath given symptoms and risk factors, but be cautious given elevated kidney function. Repeat Bmet after IVF>> came back to at 1.62. Given active chest pain will proceed with cardiac catheterization.  Risks and benefits of cardiac catheterization have been discussed with the patient.  These include bleeding, infection, kidney damage, stroke, heart attack, death.  The patient understands these risks and is willing to proceed. - COVID pending - On IV heparin and IV NTG drip - started on Aspirin - Echo ordered - Will risk stratify with lipids and A1C - sed rate and CRP elevated. Also has leukocytosis. Possible myocarditis.   HTN - home meds amlodipine 10 mg daily and valsartan 320 mg . Hold ARB in setting of AKI - will start low dose BB if pressures tolerate  AKI - 1.71 on admission, IVF started >> repeat BMET 1.62 - BMET pending  Severity of Illness: The appropriate patient status for this patient is INPATIENT. Inpatient status is judged to be reasonable and necessary in order to provide the required intensity of service to ensure the patient's safety. The patient's presenting symptoms, physical exam findings, and initial radiographic and laboratory data in the context of their chronic comorbidities is felt to place them at high risk for further clinical deterioration. Furthermore, it is not anticipated that the patient will be medically stable for discharge from the hospital within 2 midnights of admission. The following factors support the patient status  of inpatient.   " The patient's presenting symptoms include chest pain. " The worrisome physical exam findings include  chest pain. " The initial radiographic and laboratory data are worrisome because of EKG changes and elevated troponin. " The chronic co-morbidities include HTN, diabetes, obesity.   * I certify that at the point of admission it is my clinical judgment that the patient will require inpatient hospital care spanning beyond 2 midnights from the point of admission due to high intensity of service, high risk for further deterioration and high frequency of surveillance required.*   For questions or updates, please contact [facility] Please consult [email or web address] for contact info under   Signed, Cadence David StallH Furth, PA-C  07/03/2019 7:56 AM   The patient was seen, examined and discussed with Cadence David StallH Furth, PA-C   and I agree with the above.   58 y.o. female with with a pmh of diabetes, HTN, obesity, strong FHof CAD and no previous cardiac history who is being evaluated for chest pain and elevated troponin. The pain started the last night, minimal improvement with SL NTG, improvement with fentanyl but recurrence. Troponin significantly elevated 1275--> 2741, ECG shows SR, STE in the inferior leads, Q wave in the anterior leads not present in March 2020. Echo shows LVEF 50-55% with hypokinesis of the mid to apical anteroseptum, apical anterior, and apical myocardium. Labs also abnormal for elevated CRP, ARF with Crea 1.7 from baseline 0.95, WBC 18.3, Hb 12.0.  After 4 hours of hydration crea 1.6 and the patient continues to be chest pain, we will proceed with a cardiac cath. She is currently in iv Heparin, aspirin, we will add atorvastatin 80 mg po daily, no BB as she was hypotensive.   Tobias AlexanderKatarina Nataline Basara, MD 07/03/2019

## 2019-07-03 NOTE — ED Provider Notes (Addendum)
TIME SEEN: 5:09 AM  CHIEF COMPLAINT: Chest pain  HPI: Patient is a 58 year old female with history of hypertension, diabetes, obesity who presents to the emergency department chest pain that started at 8:30 PM last night while at rest.  Describes it as sharp, substernal without radiation.  No shortness of breath, diaphoresis or dizziness but did have nausea and vomiting.  No specific aggravating or alleviating factors.  No history of CAD.  Father with history of CAD in his 4s to 40s.  She does not have a cardiologist.  No history of cath.  No history of PE, DVT, exogenous estrogen use, recent fractures, surgery, trauma, hospitalization or prolonged travel. No lower extremity swelling or pain. No calf tenderness.  Last surgery was in February 2020 for rotator cuff.  ROS: See HPI Constitutional: no fever  Eyes: no drainage  ENT: no runny nose   Cardiovascular:   chest pain  Resp: no SOB  GI: no vomiting GU: no dysuria Integumentary: no rash  Allergy: no hives  Musculoskeletal: no leg swelling  Neurological: no slurred speech ROS otherwise negative  PAST MEDICAL HISTORY/PAST SURGICAL HISTORY:  Past Medical History:  Diagnosis Date  . Arthritis   . Depression   . GERD (gastroesophageal reflux disease)   . Headache    sinus headaches  . Hypertension   . PVC's (premature ventricular contractions)     MEDICATIONS:  Prior to Admission medications   Medication Sig Start Date End Date Taking? Authorizing Provider  acetaminophen (TYLENOL) 500 MG tablet Take 1,000 mg by mouth every 6 (six) hours as needed for mild pain or headache.    [provider]  amLODipine (NORVASC) 10 MG tablet Take 10 mg by mouth daily.    [provider]  buPROPion (WELLBUTRIN XL) 300 MG 24 hr tablet Take 300 mg by mouth daily.    [provider]  escitalopram (LEXAPRO) 10 MG tablet Take 10 mg by mouth daily.    [provider]  fluticasone (FLONASE) 50 MCG/ACT nasal spray  Place 1 spray into both nostrils daily as needed for allergies or rhinitis.    [provider]  meclizine (ANTIVERT) 25 MG tablet Take 1 tablet (25 mg total) by mouth 3 (three) times daily as needed for dizziness. 10/30/18   Lorre Nick, MD  Multiple Vitamin (MULTIVITAMIN WITH MINERALS) TABS tablet Take 1 tablet by mouth daily.    [provider]  potassium chloride SA (K-DUR,KLOR-CON) 20 MEQ tablet Take 1 tablet (20 mEq total) by mouth daily. 03/06/15   Cathren Laine, MD  valsartan (DIOVAN) 320 MG tablet Take 320 mg by mouth every morning.    [provider]    ALLERGIES:  Allergies  Allergen Reactions  . Dilaudid [Hydromorphone Hcl]     hives  . Erythromycin Nausea And Vomiting  . Penicillins Hives and Nausea And Vomiting    Has patient had a PCN reaction causing immediate rash, facial/tongue/throat swelling, SOB or lightheadedness with hypotension: No Has patient had a PCN reaction causing severe rash involving mucus membranes or skin necrosis: No Has patient had a PCN reaction that required hospitalization No Has patient had a PCN reaction occurring within the last 10 years: Yes If all of the above answers are "NO", then may proceed with Cephalosporin use.  Marland Kitchen Percocet [Oxycodone-Acetaminophen] Nausea And Vomiting    Ok short term.   Marland Kitchen Zithromax [Azithromycin] Nausea And Vomiting    SOCIAL HISTORY:  Social History   Tobacco Use  . Smoking status: Never  Smoker  . Smokeless tobacco: Never Used  Substance Use Topics  . Alcohol use: Yes    Comment: Social drinker    FAMILY HISTORY: No family history on file.  EXAM: BP 137/80 (BP Location: Left Arm)   Pulse 100   Temp 98.8 F (37.1 C) (Oral)   Resp 14   SpO2 95%  CONSTITUTIONAL: Alert and oriented and responds appropriately to questions.  Obese, appears uncomfortable HEAD: Normocephalic EYES: Conjunctivae clear, pupils appear equal, EOMI ENT: normal nose; moist mucous membranes NECK: Supple,  no meningismus, no nuchal rigidity, no LAD  CARD: RRR; S1 and S2 appreciated; no murmurs, no clicks, no rubs, no gallops RESP: Normal chest excursion without splinting or tachypnea; breath sounds clear and equal bilaterally; no wheezes, no rhonchi, no rales, no hypoxia or respiratory distress, speaking full sentences ABD/GI: Normal bowel sounds; non-distended; soft, non-tender, no rebound, no guarding, no peritoneal signs, no hepatosplenomegaly BACK:  The back appears normal and is non-tender to palpation, there is no CVA tenderness EXT: Normal ROM in all joints; non-tender to palpation; no edema; normal capillary refill; no cyanosis, no calf tenderness or swelling    SKIN: Normal color for age and race; warm; no rash NEURO: Moves all extremities equally PSYCH: The patient's mood and manner are appropriate. Grooming and personal hygiene are appropriate.  MEDICAL DECISION MAKING: Patient here with chest pain.  Multiple risk factors for ACS.  Initial EKG shows Q waves but no ST elevation.  Repeat EKG shows mild ST elevation in inferior leads that does not meet STEMI criteria at this time.  Will discuss with cardiology on-call for unassigned patients.  Troponin is 1275.  Will give aspirin, nitroglycerin, heparin.  Hemodynamically stable currently.  Patient's husband also updated.  ED PROGRESS: 5:36 AM  Dr. Okey Dupre, cardiology fellow on-call has reviewed EKGs and agrees this does not look like a STEMI but recommends repeat EKG.  Cardiology will admit.  He will see patient in the ED.   Patient does have a leukocytosis but appears chronic for patient and she has not having any infectious symptoms.  She does have an elevated creatinine of 1.7 with old creatinine of 0.95 in March 2020.  Will give IV fluids.  No significant relief in pain after 1 nitroglycerin.  Patient given vomiting.  Vomited the aspirin she was just given.  Will repeat dose of aspirin once patient has had IV Zofran.  5:50 AM  Pain resolved  after 3 nitroglycerin tablets.  Dr. Okey Dupre at bedside to evaluate patient.  Appreciate his help.  He agrees that this is unlikely a STEMI and does not feel patient needs emergent catheterization but would treat this like ACS.  Differential also includes myopericarditis based on PR elevation in aVR and otherwise diffuse PR depression.  Cardiology to admit.   6:05 AM  Pt's CP back to 3/10.  Will increased NTG infusion.     I reviewed all nursing notes and pertinent previous records as available.  I have interpreted any EKGs, lab and urine results, imaging (as available).   Andrea Robertson was evaluated in Emergency Department on 07/03/2019 for the symptoms described in the history of present illness. She was evaluated in the context of the global COVID-19 pandemic, which necessitated consideration that the patient might be at risk for infection with the SARS-CoV-2 virus that causes COVID-19. Institutional protocols and algorithms that pertain to the evaluation of patients at risk for COVID-19 are in a state of rapid change based on information released  by regulatory bodies including the CDC and federal and state organizations. These policies and algorithms were followed during the patient's care in the ED.    EKG Interpretation  Date/Time:  Wednesday July 03 2019 03:35:13 EST Ventricular Rate:  98 PR Interval:  164 QRS Duration: 72 QT Interval:  342 QTC Calculation: 436 R Axis:   11 Text Interpretation: Normal sinus rhythm Inferior infarct , age undetermined Anterior infarct , age undetermined Abnormal ECG When compared with ECG of 10/30/2018, Q waves are now present in inferior and anterior leads Confirmed by Delora Fuel ([id number]) on 07/03/2019 3:46:40 AM        EKG Interpretation  Date/Time:  Wednesday July 03 2019 05:09:17 EST Ventricular Rate:  95 PR Interval:  164 QRS Duration: 89 QT Interval:  345 QTC Calculation: 434 R Axis:   8 Text Interpretation: Sinus rhythm  Probable left atrial enlargement Anterior infarct, old ST elevation, consider inferior injury that appears worse than prior Baseline wander in lead(s) V5 V6 Confirmed by Pryor Curia [phone]) on 07/03/2019 5:14:18 AM       EKG Interpretation  Date/Time:  Wednesday July 03 2019 05:42:37 EST Ventricular Rate:  103 PR Interval:  164 QRS Duration: 84 QT Interval:  339 QTC Calculation: 444 R Axis:   1 Text Interpretation: Sinus tachycardia Inferior infarct, old Inferior injury noted Does not meet STEMI criteria Q waves in inferolateral leads PR depression diffusely with PR elevation in aVR Confirmed by Pryor Curia [phone]) on 07/03/2019 6:10:35 AM        EKG Interpretation  Date/Time:  Wednesday July 03 2019 05:50:03 EST Ventricular Rate:  105 PR Interval:  164 QRS Duration: 88 QT Interval:  319 QTC Calculation: 422 R Axis:   33 Text Interpretation: Sinus tachycardia Inferior infarct, old Abnormal lateral Q waves Anterior infarct, old No significant change since last tracing Confirmed by Demond Shallenberger, Cyril Mourning [phone]) on 07/03/2019 6:11:00 AM        CRITICAL CARE Performed by: Cyril Mourning Emric Kowalewski   Total critical care time: 52 minutes  Critical care time was exclusive of separately billable procedures and treating other patients.  Critical care was necessary to treat or prevent imminent or life-threatening deterioration.  Critical care was time spent personally by me on the following activities: development of treatment plan with patient and/or surrogate as well as nursing, discussions with consultants, evaluation of patient's response to treatment, examination of patient, obtaining history from patient or surrogate, ordering and performing treatments and interventions, ordering and review of laboratory studies, ordering and review of radiographic studies, pulse oximetry and re-evaluation of patient's condition.       Andrea Robertson, Andrea Bison, DO 07/03/19 [phone]

## 2019-07-03 NOTE — ED Triage Notes (Signed)
Patient reports mid chest/epigastric pain onset yesterday with emesis and mild SOB , denies fever or cough .

## 2019-07-03 NOTE — Progress Notes (Signed)
ANTICOAGULATION CONSULT NOTE - Initial Consult  Pharmacy Consult for Heparin  Indication: chest pain/ACS  Allergies  Allergen Reactions  . Dilaudid [Hydromorphone Hcl]     hives  . Erythromycin Nausea And Vomiting  . Penicillins Hives and Nausea And Vomiting    Has patient had a PCN reaction causing immediate rash, facial/tongue/throat swelling, SOB or lightheadedness with hypotension: No Has patient had a PCN reaction causing severe rash involving mucus membranes or skin necrosis: No Has patient had a PCN reaction that required hospitalization No Has patient had a PCN reaction occurring within the last 10 years: Yes If all of the above answers are "NO", then may proceed with Cephalosporin use.  Marland Kitchen Percocet [Oxycodone-Acetaminophen] Nausea And Vomiting    Ok short term.   Azucena Fallen [Azithromycin] Nausea And Vomiting    Vital Signs: Temp: 98.8 F (37.1 C) (11/11 0340) Temp Source: Oral (11/11 0340) BP: 137/80 (11/11 0340) Pulse Rate: 100 (11/11 0340)  Labs: Recent Labs    07/03/19 0349  HGB 12.0  HCT 35.8*  PLT 331  LABPROT 12.8  INR 1.0  CREATININE 1.71*  TROPONINIHS 1,275*    CrCl cannot be calculated (Unknown ideal weight.).   Medical History: Past Medical History:  Diagnosis Date  . Arthritis   . Depression   . GERD (gastroesophageal reflux disease)   . Headache    sinus headaches  . Hypertension   . PVC's (premature ventricular contractions)     Assessment: 58 y/o F with CP for heparin, troponin elevated, CBC ok, noted renal dysfunction, PTA meds reviewed.   Goal of Therapy:  Heparin level 0.3-0.7 units/ml Monitor platelets by anticoagulation protocol: Yes   Plan:  Heparin 4000 units BOLUS Start heparin drip at 1000 units/hr 1300 HL Daily CBC/HL Monitor for bleeding  Narda Bonds, PharmD, BCPS Clinical Pharmacist Phone: [phone]

## 2019-07-04 ENCOUNTER — Telehealth: Payer: Self-pay | Admitting: Cardiology

## 2019-07-04 ENCOUNTER — Encounter (HOSPITAL_COMMUNITY): Payer: Self-pay | Admitting: Cardiovascular Disease

## 2019-07-04 ENCOUNTER — Other Ambulatory Visit: Payer: Self-pay

## 2019-07-04 ENCOUNTER — Other Ambulatory Visit: Payer: Self-pay | Admitting: Medical

## 2019-07-04 DIAGNOSIS — N179 Acute kidney failure, unspecified: Secondary | ICD-10-CM

## 2019-07-04 DIAGNOSIS — E785 Hyperlipidemia, unspecified: Secondary | ICD-10-CM

## 2019-07-04 DIAGNOSIS — I25118 Atherosclerotic heart disease of native coronary artery with other forms of angina pectoris: Secondary | ICD-10-CM

## 2019-07-04 DIAGNOSIS — D72829 Elevated white blood cell count, unspecified: Secondary | ICD-10-CM

## 2019-07-04 DIAGNOSIS — E1169 Type 2 diabetes mellitus with other specified complication: Secondary | ICD-10-CM

## 2019-07-04 DIAGNOSIS — I2510 Atherosclerotic heart disease of native coronary artery without angina pectoris: Secondary | ICD-10-CM

## 2019-07-04 HISTORY — DX: Atherosclerotic heart disease of native coronary artery without angina pectoris: I25.10

## 2019-07-04 HISTORY — DX: Hyperlipidemia, unspecified: E78.5

## 2019-07-04 LAB — CBC
HCT: 35.2 % — ABNORMAL LOW (ref 36.0–46.0)
Hemoglobin: 11.9 g/dL — ABNORMAL LOW (ref 12.0–15.0)
MCH: 28.5 pg (ref 26.0–34.0)
MCHC: 33.8 g/dL (ref 30.0–36.0)
MCV: 84.2 fL (ref 80.0–100.0)
Platelets: 331 10*3/uL (ref 150–400)
RBC: 4.18 MIL/uL (ref 3.87–5.11)
RDW: 14.2 % (ref 11.5–15.5)
WBC: 21.6 10*3/uL — ABNORMAL HIGH (ref 4.0–10.5)
nRBC: 0 % (ref 0.0–0.2)

## 2019-07-04 LAB — BASIC METABOLIC PANEL
Anion gap: 13 (ref 5–15)
BUN: 14 mg/dL (ref 6–20)
CO2: 25 mmol/L (ref 22–32)
Calcium: 9.6 mg/dL (ref 8.9–10.3)
Chloride: 96 mmol/L — ABNORMAL LOW (ref 98–111)
Creatinine, Ser: 1.73 mg/dL — ABNORMAL HIGH (ref 0.44–1.00)
GFR calc Af Amer: 38 mL/min — ABNORMAL LOW (ref >60)
GFR calc non Af Amer: 33 mL/min — ABNORMAL LOW (ref >60)
Glucose, Bld: 156 mg/dL — ABNORMAL HIGH (ref 70–99)
Potassium: 3.7 mmol/L (ref 3.5–5.1)
Sodium: 134 mmol/L — ABNORMAL LOW (ref 135–145)

## 2019-07-04 LAB — LIPID PANEL
Cholesterol: 197 mg/dL (ref 0–200)
HDL: 47 mg/dL (ref >40)
LDL Cholesterol: 113 mg/dL — ABNORMAL HIGH (ref 0–99)
Total CHOL/HDL Ratio: 4.2 RATIO
Triglycerides: 183 mg/dL — ABNORMAL HIGH (ref <150)
VLDL: 37 mg/dL (ref 0–40)

## 2019-07-04 LAB — GLUCOSE, CAPILLARY
Glucose-Capillary: 142 mg/dL — ABNORMAL HIGH (ref 70–99)
Glucose-Capillary: 101 mg/dL — ABNORMAL HIGH (ref 70–99)

## 2019-07-04 MED ORDER — SODIUM CHLORIDE 0.9 % WEIGHT BASED INFUSION: 1.0000 mL/kg/h | INTRAVENOUS | Status: DC

## 2019-07-04 MED ORDER — ATORVASTATIN CALCIUM 80 MG PO TABS: 80.0000 mg | ORAL_TABLET | Freq: Every day | ORAL | Status: DC

## 2019-07-04 MED ORDER — ASPIRIN 81 MG PO TBEC: 81.0000 mg | DELAYED_RELEASE_TABLET | Freq: Every day | ORAL | 3 refills | Status: AC

## 2019-07-04 MED ORDER — TICAGRELOR 90 MG PO TABS: 90.0000 mg | ORAL_TABLET | Freq: Two times a day (BID) | ORAL | 3 refills | Status: DC

## 2019-07-04 MED ORDER — SODIUM CHLORIDE 0.9% FLUSH: 3.0000 mL | INTRAVENOUS | Status: DC | PRN

## 2019-07-04 MED ORDER — CARVEDILOL 3.125 MG PO TABS
3.1250 mg | ORAL_TABLET | Freq: Two times a day (BID) | ORAL | Status: DC
  Administered 2019-07-04: 3.125 mg via ORAL
  Filled 2019-07-04: qty 1

## 2019-07-04 MED ORDER — PNEUMOCOCCAL VAC POLYVALENT 25 MCG/0.5ML IJ INJ
0.5000 mL | INJECTION | INTRAMUSCULAR | Status: AC
  Administered 2019-07-04: 12:00:00 0.5 mL via INTRAMUSCULAR
  Filled 2019-07-04: qty 0.5

## 2019-07-04 MED ORDER — SODIUM CHLORIDE 0.9 % IV SOLN: 250.0000 mL | INTRAVENOUS | Status: DC | PRN

## 2019-07-04 MED ORDER — NITROGLYCERIN 0.4 MG SL SUBL: 0.4000 mg | SUBLINGUAL_TABLET | SUBLINGUAL | 1 refills | Status: DC | PRN

## 2019-07-04 MED ORDER — CARVEDILOL 3.125 MG PO TABS: 3.1250 mg | ORAL_TABLET | Freq: Two times a day (BID) | ORAL | 6 refills | Status: DC

## 2019-07-04 MED ORDER — SODIUM CHLORIDE 0.9 % WEIGHT BASED INFUSION: 3.0000 mL/kg/h | INTRAVENOUS | Status: DC

## 2019-07-04 MED ORDER — ASPIRIN 81 MG PO CHEW: 81.0000 mg | CHEWABLE_TABLET | ORAL | Status: DC

## 2019-07-04 MED ORDER — ATORVASTATIN CALCIUM 80 MG PO TABS: 80.0000 mg | ORAL_TABLET | Freq: Every day | ORAL | 3 refills | Status: DC

## 2019-07-04 MED FILL — NITROGLYCERIN 0.4 MG TAB SL: 0.4 | 25 days supply | Qty: 25 | Fill #0

## 2019-07-04 MED FILL — ASPIRIN 81MG ADULT LOW STRE: 81 | 30 days supply | Qty: 30 | Fill #0

## 2019-07-04 MED FILL — ATORVASTATIN CALCIUM 80 MG: 80 | 30 days supply | Qty: 30 | Fill #0

## 2019-07-04 MED FILL — BRILINTA 90 MG TABLET: 90 | 30 days supply | Qty: 60 | Fill #0

## 2019-07-04 MED FILL — CARVEDILOL 3.125 MG TABLET: 3.125 | 30 days supply | Qty: 60 | Fill #0

## 2019-07-04 NOTE — Progress Notes (Signed)
Patient stated having shortness of breath. She is s/p heart cath and given Brilinta loading dose. Patient given coke soda and temorarily placed on 2L o2. Will reassess patient.  Patient stated feeling better and denied SOB.

## 2019-07-04 NOTE — Telephone Encounter (Signed)
The pt is being discharged from the hospital today. We will call her tomorrow. 

## 2019-07-04 NOTE — Discharge Summary (Signed)
Discharge Summary    Patient ID: [name] MRN: [id number]; DOB: Jun 05, 1961  Admit date: 07/03/2019 Discharge date: 07/04/2019  Primary Care Provider: Leeroy Cha, MD  Primary Cardiologist: Ena Dawley, MD  Primary Electrophysiologist:  None   Discharge Diagnoses    Principal Problem:   NSTEMI (non-ST elevated myocardial infarction) [facility]) Active Problems:   Essential hypertension   Chest pain   AKI (acute kidney injury) [facility])   Hyperlipidemia   Coronary artery disease involving native coronary artery of native heart with angina pectoris [facility])   Diagnostic Studies/Procedures    Cardiac Cath 07/03/19 1. Total occlusion of the mid-LAD, treated successfully with PCI using a 2.5x20 mm Synergy DES 2. Mild nonobstructive stenosis of the LCx and RCA 3. Mildly elevated LVEDP  Recommend: aggressive medical therapy, DAPT with ASA and ticagrelor x 12 months without interruption     Echo 07/03/19 1. Left ventricular ejection fraction, by visual estimation, is 50 to 55%. The left ventricle has normal function. Left ventricular septal wall thickness was mildly increased. There is no left ventricular hypertrophy. 2. Left ventricular diastolic parameters are consistent with Grade I diastolic dysfunction (impaired relaxation). 3. Hypokinesis of the mid to apical anteroseptum, apical anterior, and apical myocardium. 4. Global right ventricle has normal systolic function.The right ventricular size is normal. No increase in right ventricular wall thickness. 5. Left atrial size was normal. 6. Right atrial size was normal. 7. Trivial pericardial effusion is present. 8. The mitral valve is normal in structure. No evidence of mitral valve regurgitation. No evidence of mitral stenosis. 9. The tricuspid valve is normal in structure. Tricuspid valve regurgitation is trivial. 10. The aortic valve is tricuspid. Aortic valve regurgitation is trivial. No evidence of  aortic valve sclerosis or stenosis. 11. The pulmonic valve was normal in structure. Pulmonic valve regurgitation is trivial. 12. Normal pulmonary artery systolic pressure. 13. The inferior vena cava is normal in size with greater than 50% respiratory variability, suggesting right atrial pressure of 3 mmHg. _____________   History of Present Illness     Andrea Robertson is a 58 y.o. female with with with a pmh of diabetes, HTN, obesity and no previous cardiac history who was seen in the ED for chest pain. The pain started the night before at 8:30 PM at rest. It was sharp in nature located in the substeranl area. Non-radiating with associated nausea and vomiting. Denied associated SOB, diaphoresis, or dizziness. For persistent pain the patient came in to the ED.   Hospital Course     Consultants: None   In the ED the pain resolved after 3 NTG. She was given Aspirin but vomited that up. EKG showed sinus tachycardia with mininimal ST elevation in the inferior leads and q waves in the anterior leads. HS troponin was elevated 1275, subsequent at 2741. She was started on IV heparin and IV NTG for persistent chest pain. Labs showed leukocytosis and elevated sed-rate and CRP. COVID was pending.The pain persisted in NTG was titrated as much as pressures would allow. Creatinine was 1.7, up from baseline 0.95. The patient was started on IVF. Home medication Valsartan was held in the setting of AKI. Cardiac cath was initially held for elevated creatinine and pending COVID test. COVID came back negative. Follow up BMET showed slightly improved creatinine, but with persistent chest pain the patient was taken back to the cath lab for cardiac catheterization. The cath showed total occlusion of mid LAD and mild nonobstructive stenosis of LCx and RCA. Treated with  DES to mid LAD. The patient remained stable throughout the procedure. Post-procedural recommendations fr Brilinta and Aspirin for 12 months. Patient stayed  overnight. Echo showed EF 50-55%. G1DD. Patient denied recurrent chest pain. She worked with cardiac rehab and had some exertional shortness of breath. Right radial cath site remained clean and dry with no signs of hematoma or bruit. Labs revealed increased leukocytosis, but no fever. Creatinine was up at 1.73. Will continue to hold Valsartan with plans to restart when kidney function is stable. Hgb 11.9. She was started on coreg 3.125 mg twice daily.Will hold amlodipine for now with plans to start Valsartan. DAPT with Brilinta and Aspirin for 12 months. Continue with Atorvastatin 80 mg daily and will need follow up labs as outpatient. Recommended stopping NSAIDS. Will order follow up labs for 1 week.   Patient was evaluated on 07/04/19 by Dr. Delton See and felt to be stable for discharge. Outpatient follow up was arranged.   Did the patient have an acute coronary syndrome (MI, NSTEMI, STEMI, etc) this admission?:  Yes                               AHA/ACC Clinical Performance & Quality Measures: 1. Aspirin prescribed? - Yes 2. ADP Receptor Inhibitor (Plavix/Clopidogrel, Brilinta/Ticagrelor or Effient/Prasugrel) prescribed (includes medically managed patients)? - Yes 3. Beta Blocker prescribed? - Yes 4. High Intensity Statin (Lipitor 40-80mg  or Crestor 20-40mg ) prescribed? - Yes 5. EF assessed during THIS hospitalization? - Yes 6. For EF <40%, was ACEI/ARB prescribed? - Not Applicable (EF >/= 40%) 7. For EF <40%, Aldosterone Antagonist (Spironolactone or Eplerenone) prescribed? - Not Applicable (EF >/= 40%) 8. Cardiac Rehab Phase II ordered (Included Medically managed Patients)? - Yes   _____________  Discharge Vitals Blood pressure 112/79, pulse 91, temperature 98.3 F (36.8 C), temperature source Axillary, resp. rate 17, height 5\' 3"  (1.6 m), weight 85.7 kg, SpO2 100 %.  Filed Weights   07/03/19 1312 07/04/19 0413  Weight: 82.6 kg 85.7 kg    Labs & Radiologic Studies    CBC Recent Labs     07/03/19 1225 07/04/19 0912  WBC 19.2* 21.6*  HGB 10.5* 11.9*  HCT 31.6* 35.2*  MCV 85.9 84.2  PLT 319 331   Basic Metabolic Panel Recent Labs    78/67/67 1225 07/04/19 0912  NA 137 134*  K 5.3* 3.7  CL 104 96*  CO2 24 25  GLUCOSE 121* 156*  BUN 20 14  CREATININE 1.62* 1.73*  CALCIUM 9.7 9.6   Liver Function Tests No results for input(s): AST, ALT, ALKPHOS, BILITOT, PROT, ALBUMIN in the last 72 hours. No results for input(s): LIPASE, AMYLASE in the last 72 hours. High Sensitivity Troponin:   Recent Labs  Lab 07/03/19 0349 07/03/19 0528 07/03/19 1404  TROPONINIHS 1,275* 2,741* 22,166*    BNP Invalid input(s): POCBNP D-Dimer Recent Labs    07/03/19 1404  DDIMER 0.60*   Hemoglobin A1C Recent Labs    07/03/19 0800  HGBA1C 7.5*   Fasting Lipid Panel Recent Labs    07/04/19 0912  CHOL 197  HDL 47  LDLCALC 113*  TRIG 183*  CHOLHDL 4.2   Thyroid Function Tests No results for input(s): TSH, T4TOTAL, T3FREE, THYROIDAB in the last 72 hours.  Invalid input(s): FREET3 _____________  Dg Chest 2 View  Result Date: 07/03/2019 CLINICAL DATA:  Chest pain EXAM: CHEST - 2 VIEW COMPARISON:  11/28/2015 FINDINGS: The heart size and mediastinal contours  are within normal limits. Both lungs are clear. The visualized skeletal structures are unremarkable. IMPRESSION: No active cardiopulmonary disease. Electronically Signed   By: Deatra Robinson M.D.   On: 07/03/2019 03:57   Disposition   Pt is being discharged home today in good condition.  Follow-up Plans & Appointments    Follow-up Information    Hall, Sharrell Ku, [place] Follow up on 07/11/2019.   Specialty: Cardiology Why: Please go to hospital follow up November 19th at 8:30 AM Contact information: [address] [place] [address] [place] [address] [phone]        [facility] Follow up on 07/09/2019.   Why: Please go to the office November 17th 7AM-4:30 PM  for lab work Contact information: [address] [place] [address] [phone]         Discharge Instructions    Amb Referral to Cardiac Rehabilitation   Complete by: As directed    Diagnosis:  Coronary Stents NSTEMI     After initial evaluation and assessments completed: Virtual Based Care may be provided alone or in conjunction with Phase 2 Cardiac Rehab based on patient barriers.: Yes      Discharge Medications   Allergies as of 07/04/2019      Reactions   Dilaudid [hydromorphone Hcl] Hives   Erythromycin Nausea And Vomiting   Penicillins Hives, Nausea And Vomiting   Has patient had a PCN reaction causing immediate rash, facial/tongue/throat swelling, SOB or lightheadedness with hypotension: No Has patient had a PCN reaction causing severe rash involving mucus membranes or skin necrosis: No Has patient had a PCN reaction that required hospitalization No Has patient had a PCN reaction occurring within the last 10 years: Yes If all of the above answers are "NO", then may proceed with Cephalosporin use.   Percocet [oxycodone-acetaminophen] Itching, Nausea And Vomiting   Can tolerate with premedication of diphenhydramine   Zithromax [azithromycin] Nausea And Vomiting      Medication List    STOP taking these medications   amLODipine 10 MG tablet Commonly known as: NORVASC   meloxicam 7.5 MG tablet Commonly known as: MOBIC   valsartan 320 MG tablet Commonly known as: DIOVAN     TAKE these medications   acetaminophen 500 MG tablet Commonly known as: TYLENOL Take 1,000 mg by mouth every 6 (six) hours as needed for mild pain or headache.   aspirin 81 MG EC tablet Take 1 tablet (81 mg total) by mouth daily.   atorvastatin 80 MG tablet Commonly known as: LIPITOR Take 1 tablet (80 mg total) by mouth daily at 6 PM.   buPROPion 300 MG 24 hr tablet Commonly known as: WELLBUTRIN XL Take 300 mg by mouth daily.   carvedilol 3.125 MG  tablet Commonly known as: COREG Take 1 tablet (3.125 mg total) by mouth 2 (two) times daily with a meal.   escitalopram 10 MG tablet Commonly known as: LEXAPRO Take 10 mg by mouth daily.   Farxiga 5 MG Tabs tablet Generic drug: dapagliflozin propanediol Take 5 mg by mouth daily.   fluticasone 50 MCG/ACT nasal spray Commonly known as: FLONASE Place 1 spray into both nostrils daily as needed for allergies or rhinitis.   meclizine 25 MG tablet Commonly known as: ANTIVERT Take 1 tablet (25 mg total) by mouth 3 (three) times daily as needed for dizziness.   multivitamin with minerals Tabs tablet Take 1 tablet by mouth daily.   nitroGLYCERIN 0.4 MG SL tablet Commonly known  as: NITROSTAT Place 1 tablet (0.4 mg total) under the tongue every 5 (five) minutes x 3 doses as needed for chest pain.   spironolactone 25 MG tablet Commonly known as: ALDACTONE Take 25 mg by mouth daily.   ticagrelor 90 MG Tabs tablet Commonly known as: BRILINTA Take 1 tablet (90 mg total) by mouth 2 (two) times daily.          Outstanding Labs/Studies   None  Duration of Discharge Encounter   Greater than 30 minutes including physician time.  Signed, Emrey Thornley David StallH Kanan Sobek, PA-C 07/04/2019, 11:24 AM

## 2019-07-04 NOTE — Care Management (Signed)
0938 Benefits Check submitted for Brilinta. Brilinta card provided to patient. No further needs from CM at this time. Bethena Roys, RN,BSN Case Manager [phone]

## 2019-07-04 NOTE — Telephone Encounter (Signed)
New message   Per Cadence F. Setup TOC appt with Vin Bhagat on 07/11/19 at 8:30 am.

## 2019-07-04 NOTE — Progress Notes (Addendum)
Progress Note  Patient Name: Andrea Robertson Date of Encounter: 07/04/2019  Primary Cardiologist: Tobias Alexander, MD   Subjective   Patient feeling much better this morning. Denies recurrent CP. Has some SOB on exertion while working with cardiac rehab. Cath sie right radial is clean and dry.   Inpatient Medications    Scheduled Meds: . [START ON 07/05/2019] aspirin  81 mg Oral Pre-Cath  . aspirin EC  81 mg Oral Daily  . buPROPion  300 mg Oral Daily  . escitalopram  10 mg Oral Daily  . heparin  5,000 Units Subcutaneous Q8H  . insulin aspart  0-20 Units Subcutaneous TID WC  .  morphine injection  2 mg Intravenous Once  . sodium chloride flush  3 mL Intravenous Q12H  . sodium chloride flush  3 mL Intravenous Q12H  . sodium chloride flush  3 mL Intravenous Q12H  . ticagrelor  90 mg Oral BID   Continuous Infusions: . sodium chloride 125 mL/hr at 07/03/19 0948  . sodium chloride    . sodium chloride    . sodium chloride    . [START ON 07/05/2019] sodium chloride     Followed by  . [START ON 07/05/2019] sodium chloride    . nitroGLYCERIN 5 mcg/min (07/03/19 2331)   PRN Meds: sodium chloride, sodium chloride, sodium chloride, acetaminophen, ALPRAZolam, nitroGLYCERIN, ondansetron (ZOFRAN) IV, sodium chloride flush, sodium chloride flush, sodium chloride flush, zolpidem   Vital Signs    Vitals:   07/03/19 1815 07/03/19 2141 07/04/19 0202 07/04/19 0413  BP: 110/73 (!) 104/59 101/60 112/79  Pulse:  89  91  Resp:    17  Temp:  98.9 F (37.2 C)  98.3 F (36.8 C)  TempSrc:  Oral  Axillary  SpO2:  100%  100%  Weight:    85.7 kg  Height:        Intake/Output Summary (Last 24 hours) at 07/04/2019 0811 Last data filed at 07/04/2019 0400 Gross per 24 hour  Intake 1171.09 ml  Output 1200 ml  Net -28.91 ml   Last 3 Weights 07/04/2019 07/03/2019 10/13/2015  Weight (lbs) 189 lb 182 lb 186 lb  Weight (kg) 85.73 kg 82.555 kg 84.369 kg      Telemetry    NSR, HR  70-80s; sinus tachycardia 100-110; rare PVCs - Personally Reviewed  ECG    NSR, 89 bpm, Q waves infero-anterior leads, diffuse T wave changes - Personally Reviewed  Physical Exam   GEN: No acute distress.   Neck: No JVD Cardiac: RRR, no murmurs, rubs, or gallops.  Respiratory: Clear to auscultation bilaterally. GI: Soft, nontender, non-distended  MS: No edema; No deformity. Neuro:  Nonfocal  Psych: Normal affect   Labs    High Sensitivity Troponin:   Recent Labs  Lab 07/03/19 0349 07/03/19 0528 07/03/19 1404  TROPONINIHS 1,275* 2,741* 22,166*      Chemistry Recent Labs  Lab 07/03/19 0349 07/03/19 1225  NA 135 137  K 4.7 5.3*  CL 98 104  CO2 24 24  GLUCOSE 208* 121*  BUN 22* 20  CREATININE 1.71* 1.62*  CALCIUM 10.3 9.7  GFRNONAA 33* 35*  GFRAA 38* 41*  ANIONGAP 13 9     Hematology Recent Labs  Lab 07/03/19 0349 07/03/19 1225  WBC 18.3* 19.2*  RBC 4.22 3.68*  HGB 12.0 10.5*  HCT 35.8* 31.6*  MCV 84.8 85.9  MCH 28.4 28.5  MCHC 33.5 33.2  RDW 13.9 14.0  PLT 331 319    BNPNo  results for input(s): BNP, PROBNP in the last 168 hours.   DDimer  Recent Labs  Lab 07/03/19 1404  DDIMER 0.60*     Radiology    Dg Chest 2 View  Result Date: 07/03/2019 CLINICAL DATA:  Chest pain EXAM: CHEST - 2 VIEW COMPARISON:  11/28/2015 FINDINGS: The heart size and mediastinal contours are within normal limits. Both lungs are clear. The visualized skeletal structures are unremarkable. IMPRESSION: No active cardiopulmonary disease. Electronically Signed   By: Ulyses Jarred M.D.   On: 07/03/2019 03:57    Cardiac Studies   Cardiac Cath 07/03/19 1. Total occlusion of the mid-LAD, treated successfully with PCI using a 2.5x20 mm Synergy DES 2. Mild nonobstructive stenosis of the LCx and RCA 3. Mildly elevated LVEDP  Recommend: aggressive medical therapy, DAPT with ASA and ticagrelor x 12 months without interruption  Echo 07/03/19  1. Left ventricular ejection  fraction, by visual estimation, is 50 to 55%. The left ventricle has normal function. Left ventricular septal wall thickness was mildly increased. There is no left ventricular hypertrophy.  2. Left ventricular diastolic parameters are consistent with Grade I diastolic dysfunction (impaired relaxation).  3. Hypokinesis of the mid to apical anteroseptum, apical anterior, and apical myocardium.  4. Global right ventricle has normal systolic function.The right ventricular size is normal. No increase in right ventricular wall thickness.  5. Left atrial size was normal.  6. Right atrial size was normal.  7. Trivial pericardial effusion is present.  8. The mitral valve is normal in structure. No evidence of mitral valve regurgitation. No evidence of mitral stenosis.  9. The tricuspid valve is normal in structure. Tricuspid valve regurgitation is trivial. 10. The aortic valve is tricuspid. Aortic valve regurgitation is trivial. No evidence of aortic valve sclerosis or stenosis. 11. The pulmonic valve was normal in structure. Pulmonic valve regurgitation is trivial. 12. Normal pulmonary artery systolic pressure. 13. The inferior vena cava is normal in size with greater than 50% respiratory variability, suggesting right atrial pressure of 3 mmHg. Patient Profile     58 y.o. female  with with a pmh of diabetes, HTN, obesity and no previous cardiac history who was admitted for NSTEMI and treated with DES to mid-LAD.   Assessment & Plan    NSTEMI Patient with positive family histroy of CAD and multiple risk factors presented to the ER with substernal chest pain with associated nausea and vomiting relieved with NTG. EKG wth dynamic changes, minimal ST elevation inferior leads. HS troponin peaked at 22,166. Also with leukocytosis. With elevated creatinine was started on IVF. Despite elevated creatinine patient underwent cardiac cath yesterday. - Cath showed total occlusion of mid LAD, mild nonobstructive  stenosis of LCx and RCA, treated with PCI to mid LAD.  - cath site right radial is clean and dry with no hematoma/bruit - Patient denies CP, but has some SOB on exertion.  - Continue Brilinta and Aspirin x 12 months - Echo showed EF 50-55%, G1DD, trivial pericardial effusion.  - LDL pending, A1C 7.5 - sed rate and CRP elevated. Had leukocytosis on admission>> AM labs pending - Creatinine pending - Will start coreg 3.125 mg BID and atorvastatin 80 mg daily - Likely discharge once labs come back.   HTN - home meds amlodipine 10 mg daily and valsartan 320 mg . ARB held in setting of AKI - will start coreg 3.125 mg. Can restart ARB once creatinine improves  AKI - 1.71 on admission, IVF started >> repeat BMET 1.62 -  BMET pending  HLD - Atorvastatin 80 mg daily started - LDL pending - will need f/u labs outpatient  For questions or updates, please contact [facility] Please consult [email or web address] for contact info under   Signed, Skylynne Schlechter David Stall, PA-C  07/04/2019, 8:11 AM    The patient was seen, examined and discussed with Niasia Lanphear David Stall, PA-C   and I agree with the above.   She is chest pain free, with just mild SOB, troponin up to 22.166, WBC 21.000, no fever, chills, CXR normal,  she has no signs of infection, access site with no bruit or hematoma, we will discharge and arrange follow up with labs including CMP, CBC. We will continue ASA, ticagrelor, atorvastatin 80 mg po daily, valsartan has been held, BP normal. Crea pending today.  Tobias Alexander, MD 07/04/2019

## 2019-07-04 NOTE — Progress Notes (Signed)
CARDIAC REHAB PHASE I   PRE:  Rate/Rhythm: 93 SR  BP:  Supine:   Sitting: 128/88  Standing:    SaO2: 98%RA  MODE:  Ambulation: 700 ft   POST:  Rate/Rhythm: 114 ST  BP:  Supine:   Sitting: 143/86  Standing:    SaO2: 98%RA [id number] Pt walked 700 ft on RA with steady gait and no CP. Stopped once to rest. MI education completed with pt who voiced understanding. Stressed importance of brilinta with stent. Reviewed NTG use, MI restrictions, watching carbs and heart healthy food choices, ex ed and CRP 2. Referred to [place] program.  Pt is interested in participating in Virtual Cardiac and Pulmonary Rehab. Pt advised that Virtual Cardiac and Pulmonary Rehab is provided at no cost to the patient.  Checklist:  1. Pt has smart device  ie smartphone and/or ipad for downloading an app  Yes 2. Reliable internet/wifi service    Yes 3. Understands how to use their smartphone and navigate within an app.  Yes   Pt verbalized understanding and is in agreement.    Graylon Good, RN BSN  07/04/2019 9:08 AM

## 2019-07-04 NOTE — TOC Benefit Eligibility Note (Signed)
Transition of Care [facility]) Benefit Eligibility Note    Patient Details  Name: Andrea Robertson MRN: [id number] Date of Birth: 07/28/1961   Medication/Dose: BRILINTA  90 MG BID  Covered?: Yes  Tier: (NO TIER)  Prescription Coverage [facility]  90 DAY SUPPLY FOR M/O OR   RETAIL ZERO DOLLARS  Spoke with Person/Company/Phone Number:: CIERRA  @  [facility] # [phone] OPT- MEMBER  Co-Pay: $ 5.00  Prior Approval: No  Deductible: Met  Additional Notes: TICAGRELOR: [name] Phone Number: 07/04/2019, 11:05 AM

## 2019-07-05 ENCOUNTER — Ambulatory Visit (INDEPENDENT_AMBULATORY_CARE_PROVIDER_SITE_OTHER): Payer: BC Managed Care – PPO | Admitting: Internal Medicine

## 2019-07-05 ENCOUNTER — Encounter: Payer: Self-pay | Admitting: Internal Medicine

## 2019-07-05 ENCOUNTER — Other Ambulatory Visit: Payer: Self-pay

## 2019-07-05 VITALS — BP 104/60 | HR 92 | Ht 63.0 in | Wt 188.8 lb

## 2019-07-05 DIAGNOSIS — I2510 Atherosclerotic heart disease of native coronary artery without angina pectoris: Secondary | ICD-10-CM | POA: Diagnosis not present

## 2019-07-05 DIAGNOSIS — R079 Chest pain, unspecified: Secondary | ICD-10-CM | POA: Diagnosis not present

## 2019-07-05 MED ORDER — COLCHICINE 0.6 MG PO TABS: 0.6000 mg | ORAL_TABLET | Freq: Two times a day (BID) | ORAL | 1 refills | Status: DC

## 2019-07-05 NOTE — Patient Instructions (Signed)
Medication Instructions:  Your physician has recommended you make the following change in your medication:  1.) acetaminophen 325 mg (Tylenol) take 2 tablets by mouth every 6 hours as needed for pain 2.) colchicine 0.6 mg -take 1 tablet by mouth twice a day  *If you need a refill on your cardiac medications before your next appointment, please call your pharmacy*  Lab Work: Keep appointment as scheduled. If you have labs (blood work) drawn today and your tests are completely normal, you will receive your results only by: Marland Kitchen MyChart Message (if you have MyChart) OR . A paper copy in the mail If you have any lab test that is abnormal or we need to change your treatment, we will call you to review the results.  Testing/Procedures: none  Keep your follow-up as scheduled.

## 2019-07-05 NOTE — Progress Notes (Signed)
Cardiology Office Note   Date:  07/05/2019   ID:  Andrea Robertson, DOB 22-Jul-1961, MRN [id number]  PCP:  Leeroy Cha, MD  Cardiologist:   Dorris Carnes, MD   Pt presents for eval of CP   History of Present Illness: Andrea Robertson is a 58 y.o. female with a history of DM, HTN, obesity   She present to [facility] 11/11 with CP   Sharp  Substernal.  In ED given NTG and ASA   Vomitted   EKG with minimal ST elevation in the inferir leads and Q waves in anterior leads.    Trop peak 2741.   She was started on NTG and heparin   Pain continued  Initial Cr 48   Pat went on for cath because of uncontrolled symtoms   Cath showed 100% mid LAD; mild dz in LCx and RCA   She had DES to mid LAD   Plan for ASA and Brilinta for 12 months   Echo done  LVEF 50 to 40%  Gr I diastolic dysfunction   The pt was sent home yestrerday   Yesterday and today the pt has had L sided CP   She says the pain is worse with a deep breath and worse when she lays down or moves.    She denies cough   No fever  No injury   Different from the pain that took her to the hosp   That pain was sharp, more mid chest       Current Meds  Medication Sig  . acetaminophen (TYLENOL) 500 MG tablet Take 1,000 mg by mouth every 6 (six) hours as needed for mild pain or headache.  Marland Kitchen aspirin EC 81 MG EC tablet Take 1 tablet (81 mg total) by mouth daily.  Marland Kitchen atorvastatin (LIPITOR) 80 MG tablet Take 1 tablet (80 mg total) by mouth daily at 6 PM.  . buPROPion (WELLBUTRIN XL) 300 MG 24 hr tablet Take 300 mg by mouth daily.  . carvedilol (COREG) 3.125 MG tablet Take 1 tablet (3.125 mg total) by mouth 2 (two) times daily with a meal.  . escitalopram (LEXAPRO) 10 MG tablet Take 10 mg by mouth daily.  Marland Kitchen FARXIGA 5 MG TABS tablet Take 5 mg by mouth daily.  . fluticasone (FLONASE) 50 MCG/ACT nasal spray Place 1 spray into both nostrils daily as needed for allergies or rhinitis.  Marland Kitchen meclizine (ANTIVERT) 25 MG tablet Take 1 tablet (25 mg total) by  mouth 3 (three) times daily as needed for dizziness.  . Multiple Vitamin (MULTIVITAMIN WITH MINERALS) TABS tablet Take 1 tablet by mouth daily.  . nitroGLYCERIN (NITROSTAT) 0.4 MG SL tablet Place 1 tablet (0.4 mg total) under the tongue every 5 (five) minutes x 3 doses as needed for chest pain.  Marland Kitchen spironolactone (ALDACTONE) 25 MG tablet Take 25 mg by mouth daily.  . ticagrelor (BRILINTA) 90 MG TABS tablet Take 1 tablet (90 mg total) by mouth 2 (two) times daily.     Allergies:   Dilaudid [hydromorphone hcl], Erythromycin, Penicillins, Percocet [oxycodone-acetaminophen], and Zithromax [azithromycin]   Past Medical History:  Diagnosis Date  . Arthritis   . Coronary artery disease   . Depression   . Diabetes mellitus without complication ([place])   . GERD (gastroesophageal reflux disease)   . Headache    sinus headaches  . Hypertension   . PVC's (premature ventricular contractions)     Past Surgical History:  Procedure Laterality Date  . ABDOMINAL HYSTERECTOMY    .  CHOLECYSTECTOMY    . CORONARY STENT INTERVENTION N/A 07/03/2019   Procedure: CORONARY STENT INTERVENTION;  Surgeon: Tonny Bollman, MD;  Location: [facility] INVASIVE CV LAB;  Service: Cardiovascular;  Laterality: N/A;  . LEFT HEART CATH AND CORONARY ANGIOGRAPHY N/A 07/03/2019   Procedure: LEFT HEART CATH AND CORONARY ANGIOGRAPHY;  Surgeon: Tonny Bollman, MD;  Location: [facility] INVASIVE CV LAB;  Service: Cardiovascular;  Laterality: N/A;  . TOTAL KNEE ARTHROPLASTY Right 10/13/2015   Procedure: RIGHT TOTAL KNEE ARTHROPLASTY;  Surgeon: Ranee Gosselin, MD;  Location: [facility];  Service: Orthopedics;  Laterality: Right;     Social History:  The patient  reports that she has never smoked. She has never used smokeless tobacco. She reports current alcohol use. She reports that she does not use drugs.   Family History:  The patient's family history includes CAD in her father.    ROS:  Please see the history of present illness. All other  systems are reviewed and  Negative to the above problem except as noted.    PHYSICAL EXAM: VS:  BP 104/60   Pulse 92   Ht 5\' 3"  (1.6 m)   Wt 188 lb 12.8 oz (85.6 kg)   BMI 33.44 kg/m   GEN: Well nourished, well developed, in no acute distress  HEENT: normal  Neck: no JVD, carotid bruits, or masses Cardiac: RRR; no murmurs, rubs   LE   No edema Chest   TEnder to palpation L chest   Brings on some of pain   Respiratory:  clear to auscultation bilaterally, normal work of breathing GI: soft, nontender, nondistended, + BS  No hepatomegaly  MS: no deformity Moving all extremities   Skin: warm and dry, no rash Neuro:  Strength and sensation are intact Psych: euthymic mood, full affect   EKG:  EKG is ordered today.  SR 92 bpm  IVMI  Anterolateral MI   ST elevation in I, II, AVF, AVL, V2 to V6   Appears progression of EKG in hosp   Lipid Panel    Component Value Date/Time   CHOL 197 07/04/2019 0912   TRIG 183 (H) 07/04/2019 0912   HDL 47 07/04/2019 0912   CHOLHDL 4.2 07/04/2019 0912   VLDL 37 07/04/2019 0912   LDLCALC 113 (H) 07/04/2019 0912      Wt Readings from Last 3 Encounters:  07/05/19 188 lb 12.8 oz (85.6 kg)  07/04/19 189 lb (85.7 kg)  10/13/15 186 lb (84.4 kg)      ASSESSMENT AND PLAN:  1   Chest pain   Current pain does not appear to be due to coronary ischemia    It is positional .   Palpation of chest brings on some of pain    Exam also signif for no rub.    May be skel muscle tenderness.  May have a pleuropericarditis.    I would recomm tylenol 500 q 6 hours   I would also recomm colchinie 0.6 bid    The pt has appt with B Bhagat next week   She should keep that appt  2   CAD    recent intervention   Follow    3   HL   Lipids will need to be folowed on current statin     Current medicines are reviewed at length with the patient today.  The patient does not have concerns regarding medicines.  Signed, 10/15/15, MD  07/05/2019 11:43 PM      [facility] [phone]  [address] [place], [place]  [address] Phone: [phone]; Fax: [phone]

## 2019-07-05 NOTE — Telephone Encounter (Signed)
Called patient back about her symptoms. Patient stated she gets chest pain when she lays down or when she takes a deep breath. Patient stated she gets SOB with the chest pain. Patient stated she took Tylenol this morning and it did not help. Will discuss with DOD, Dr. Harrington Challenger.  Dr. Harrington Challenger recommend patient to come in and her take a look at her and get some lab work. Dr. Harrington Challenger had an opening at 3:20 pm. Patient was agreeable to come in. Informed patient of office locations. Patient will come in today.

## 2019-07-05 NOTE — Telephone Encounter (Signed)
Patient contacted regarding discharge from [facility] on 07/04/2019.  Patient understands to follow up with provider Andrea Lis, PA-c on 07/11/2019 at 8:30 at [facility] in [place], [place] [address] Patient understands discharge instructions? Yes Patient understands medications and regiment? Yes Patient understands to bring all medications to this visit? Yes  The pt is s/p NSTEMI with stent placement on 07/03/2019. She c/o CP ( that is worse when trying to take a deep breath) and SOB. She denies radiation of pain, n/v, sweating and dizziness. She rates her pain at 4 on a scales of 1 (Least amount) to 10 (worst amount)  She is advised that I am forwarding this message to our triage nurses and that they will be in touch with her soon.

## 2019-07-09 ENCOUNTER — Other Ambulatory Visit: Payer: BC Managed Care – PPO

## 2019-07-09 ENCOUNTER — Other Ambulatory Visit: Payer: Self-pay

## 2019-07-09 DIAGNOSIS — D72829 Elevated white blood cell count, unspecified: Secondary | ICD-10-CM

## 2019-07-09 DIAGNOSIS — N179 Acute kidney failure, unspecified: Secondary | ICD-10-CM | POA: Diagnosis not present

## 2019-07-09 NOTE — Addendum Note (Signed)
Addended by: Patterson Hammersmith A on: 07/09/2019 04:21 PM   Modules accepted: Orders

## 2019-07-10 ENCOUNTER — Telehealth (HOSPITAL_COMMUNITY): Payer: Self-pay

## 2019-07-10 LAB — CBC
Hematocrit: 34.6 % (ref 34.0–46.6)
Hemoglobin: 11.6 g/dL (ref 11.1–15.9)
MCH: 28.1 pg (ref 26.6–33.0)
MCHC: 33.5 g/dL (ref 31.5–35.7)
MCV: 84 fL (ref 79–97)
Platelets: 398 10*3/uL (ref 150–450)
RBC: 4.13 x10E6/uL (ref 3.77–5.28)
RDW: 14 % (ref 11.7–15.4)
WBC: 15.2 10*3/uL — ABNORMAL HIGH (ref 3.4–10.8)

## 2019-07-10 LAB — BASIC METABOLIC PANEL
BUN/Creatinine Ratio: 14 (ref 9–23)
BUN: 25 mg/dL — ABNORMAL HIGH (ref 6–24)
CO2: 22 mmol/L (ref 20–29)
Calcium: 9.2 mg/dL (ref 8.7–10.2)
Chloride: 101 mmol/L (ref 96–106)
Creatinine, Ser: 1.78 mg/dL — ABNORMAL HIGH (ref 0.57–1.00)
GFR calc Af Amer: 36 mL/min/{1.73_m2} — ABNORMAL LOW (ref >59)
GFR calc non Af Amer: 32 mL/min/{1.73_m2} — ABNORMAL LOW (ref >59)
Glucose: 119 mg/dL — ABNORMAL HIGH (ref 65–99)
Potassium: 4.2 mmol/L (ref 3.5–5.2)
Sodium: 137 mmol/L (ref 134–144)

## 2019-07-10 NOTE — Telephone Encounter (Signed)
Pt insurance is active and benefits verified through [facility]. Co-pay $0.00, DED $100.00/$100.00 met, out of pocket $1,000.00/$1,000.00 met, co-insurance 20%. No pre-authorization required. Passport, 09/08/2018 @ 11:47AM, YIA#[id number]  Will contact patient to see if she is interested in the Cardiac Rehab Program. If interested, patient will need to complete follow up appt. Once completed, patient will be contacted for scheduling upon review by the RN Navigator.

## 2019-07-10 NOTE — Telephone Encounter (Signed)
Called patient to see if she is interested in the Cardiac Rehab Program. Patient expressed interest. Explained scheduling process and went over insurance, patient verbalized understanding. Will contact patient for scheduling once f/u has been completed. 

## 2019-07-10 NOTE — Progress Notes (Signed)
Cardiology Office Note    Date:  07/11/2019   ID:  Andrea Robertson, DOB July 12, 1961, MRN [id number]  PCP:  Lorenda Ishihara, MD  Cardiologist:  Dr. Delton See   Chief Complaint: Hospital follow up   History of Present Illness:   Andrea Robertson is a 58 y.o. female CAD s/p DES to mLAD, HTN, HLD, diabetes mellitus and HLD seen for follow up.  Admitted 06/2019 with NSTEMI. EKG wth dynamic changes, minimal ST elevation inferior leads. HS troponin peaked at 22,166. Cath showed total occlusion of mid LAD, mild nonobstructive stenosis of LCx and RCA, treated with PCI to mid LAD. Echoshowed EF 50-55%, G1DD, trivial pericardial effusion. had AKI and valsartan held. Sed rateandCRPelevated>> but no treatment. Seen  In clinic by Dr. Tenny Craw next day after discharge for CP which felt non ischemic. Could be pericarditis. Started on colchicine and Tylenol.  Seen today for follow up.  Her chest pain resolved on a day since started taking colchicine and Tylenol.  She is walking 10 minutes twice a day without chest pain or shortness of breath.  No palpitation, dizziness, orthopnea, PND, syncope, lower extremity edema or melena.  She was on hydrochlorothiazide for high blood pressure previously which change it to spironolactone by PCP secondary to hypokalemia.  Past Medical History:  Diagnosis Date  . Arthritis   . Coronary artery disease   . Depression   . Diabetes mellitus without complication ([facility])   . GERD (gastroesophageal reflux disease)   . Headache    sinus headaches  . Hypertension   . PVC's (premature ventricular contractions)     Past Surgical History:  Procedure Laterality Date  . ABDOMINAL HYSTERECTOMY    . CHOLECYSTECTOMY    . CORONARY STENT INTERVENTION N/A 07/03/2019   Procedure: CORONARY STENT INTERVENTION;  Surgeon: Tonny Bollman, MD;  Location: [facility] INVASIVE CV LAB;  Service: Cardiovascular;  Laterality: N/A;  . LEFT HEART CATH AND CORONARY ANGIOGRAPHY N/A  07/03/2019   Procedure: LEFT HEART CATH AND CORONARY ANGIOGRAPHY;  Surgeon: Tonny Bollman, MD;  Location: [facility] INVASIVE CV LAB;  Service: Cardiovascular;  Laterality: N/A;  . TOTAL KNEE ARTHROPLASTY Right 10/13/2015   Procedure: RIGHT TOTAL KNEE ARTHROPLASTY;  Surgeon: Ranee Gosselin, MD;  Location: [facility];  Service: Orthopedics;  Laterality: Right;    Current Medications: Prior to Admission medications   Medication Sig Start Date End Date Taking? Authorizing Provider  acetaminophen (TYLENOL) 500 MG tablet Take 1,000 mg by mouth every 6 (six) hours as needed for mild pain or headache.    [provider]  aspirin EC 81 MG EC tablet Take 1 tablet (81 mg total) by mouth daily. 07/04/19   Furth, Cadence H, PA-C  atorvastatin (LIPITOR) 80 MG tablet Take 1 tablet (80 mg total) by mouth daily at 6 PM. 07/04/19   Furth, Cadence H, PA-C  buPROPion (WELLBUTRIN XL) 300 MG 24 hr tablet Take 300 mg by mouth daily.    [provider]  carvedilol (COREG) 3.125 MG tablet Take 1 tablet (3.125 mg total) by mouth 2 (two) times daily with a meal. 07/04/19   Furth, Cadence H, PA-C  colchicine 0.6 MG tablet Take 1 tablet (0.6 mg total) by mouth 2 (two) times daily. 07/05/19   Pricilla Riffle, MD  escitalopram (LEXAPRO) 10 MG tablet Take 10 mg by mouth daily.    [provider]  FARXIGA 5 MG TABS tablet Take 5 mg by mouth daily. 06/12/19   [provider]  fluticasone (FLONASE) 50  MCG/ACT nasal spray Place 1 spray into both nostrils daily as needed for allergies or rhinitis.    [provider]  meclizine (ANTIVERT) 25 MG tablet Take 1 tablet (25 mg total) by mouth 3 (three) times daily as needed for dizziness. 10/30/18   Lorre NickAllen, Anthony, MD  Multiple Vitamin (MULTIVITAMIN WITH MINERALS) TABS tablet Take 1 tablet by mouth daily.    [provider]  nitroGLYCERIN (NITROSTAT) 0.4 MG SL tablet Place 1 tablet (0.4 mg total) under the tongue every 5 (five) minutes x 3 doses as  needed for chest pain. 07/04/19   Furth, Cadence H, PA-C  spironolactone (ALDACTONE) 25 MG tablet Take 25 mg by mouth daily. 06/12/19   [provider]  ticagrelor (BRILINTA) 90 MG TABS tablet Take 1 tablet (90 mg total) by mouth 2 (two) times daily. 07/04/19   Furth, Cadence H, PA-C    Allergies:   Dilaudid [hydromorphone hcl], Erythromycin, Penicillins, Percocet [oxycodone-acetaminophen], and Zithromax [azithromycin]   Social History   Socioeconomic History  . Marital status: Married    Spouse name: Not on file  . Number of children: Not on file  . Years of education: Not on file  . Highest education level: Not on file  Occupational History  . Not on file  Social Needs  . Financial resource strain: Not on file  . Food insecurity    Worry: Not on file    Inability: Not on file  . Transportation needs    Medical: Not on file    Non-medical: Not on file  Tobacco Use  . Smoking status: Never Smoker  . Smokeless tobacco: Never Used  Substance and Sexual Activity  . Alcohol use: Yes    Comment: Social drinker  . Drug use: No  . Sexual activity: Yes  Lifestyle  . Physical activity    Days per week: Not on file    Minutes per session: Not on file  . Stress: Not on file  Relationships  . Social Musicianconnections    Talks on phone: Not on file    Gets together: Not on file    Attends religious service: Not on file    Active member of club or organization: Not on file    Attends meetings of clubs or organizations: Not on file    Relationship status: Not on file  Other Topics Concern  . Not on file  Social History Narrative  . Not on file     Family History:  The patient's family history includes CAD in her father.   ROS:   Please see the history of present illness.    ROS All other systems reviewed and are negative.   PHYSICAL EXAM:   VS:  BP 126/76   Pulse 83   Ht 5\' 3"  (1.6 m)   Wt 187 lb (84.8 kg)   SpO2 99%   BMI 33.13 kg/m    GEN: Well nourished, well  developed, in no acute distress  HEENT: normal  Neck: no JVD, carotid bruits, or masses Cardiac: RRR; no murmurs, rubs, or gallops,no edema  Respiratory:  clear to auscultation bilaterally, normal work of breathing GI: soft, nontender, nondistended, + BS MS: no deformity or atrophy  Skin: warm and dry, no rash Neuro:  Alert and Oriented x 3, Strength and sensation are intact Psych: euthymic mood, full affect  Wt Readings from Last 3 Encounters:  07/11/19 187 lb (84.8 kg)  07/05/19 188 lb 12.8 oz (85.6 kg)  07/04/19 189 lb (85.7 kg)  Studies/Labs Reviewed:   EKG:  EKG is not ordered today.    Recent Labs: 07/09/2019: BUN 25; Creatinine, Ser 1.78; Hemoglobin 11.6; Platelets 398; Potassium 4.2; Sodium 137   Lipid Panel    Component Value Date/Time   CHOL 197 07/04/2019 0912   TRIG 183 (H) 07/04/2019 0912   HDL 47 07/04/2019 0912   CHOLHDL 4.2 07/04/2019 0912   VLDL 37 07/04/2019 0912   LDLCALC 113 (H) 07/04/2019 0912    Additional studies/ records that were reviewed today include:   Echocardiogram: 07/03/2019 1. Left ventricular ejection fraction, by visual estimation, is 50 to 55%. The left ventricle has normal function. Left ventricular septal wall thickness was mildly increased. There is no left ventricular hypertrophy.  2. Left ventricular diastolic parameters are consistent with Grade I diastolic dysfunction (impaired relaxation).  3. Hypokinesis of the mid to apical anteroseptum, apical anterior, and apical myocardium.  4. Global right ventricle has normal systolic function.The right ventricular size is normal. No increase in right ventricular wall thickness.  5. Left atrial size was normal.  6. Right atrial size was normal.  7. Trivial pericardial effusion is present.  8. The mitral valve is normal in structure. No evidence of mitral valve regurgitation. No evidence of mitral stenosis.  9. The tricuspid valve is normal in structure. Tricuspid valve regurgitation  is trivial. 10. The aortic valve is tricuspid. Aortic valve regurgitation is trivial. No evidence of aortic valve sclerosis or stenosis. 11. The pulmonic valve was normal in structure. Pulmonic valve regurgitation is trivial. 12. Normal pulmonary artery systolic pressure. 13. The inferior vena cava is normal in size with greater than 50% respiratory variability, suggesting right atrial pressure of 3 mmHg.  CORONARY STENT INTERVENTION  07/03/2019  LEFT HEART CATH AND CORONARY ANGIOGRAPHY  Conclusion  1. Total occlusion of the mid-LAD, treated successfully with PCI using a 2.5x20 mm Synergy DES 2. Mild nonobstructive stenosis of the LCx and RCA 3. Mildly elevated LVEDP  Recommend: aggressive medical therapy, DAPT with ASA and ticagrelor x 12 months without interruption       ASSESSMENT & PLAN:    1. Presume pericarditis - Sed rate and CRP was up in hospital. Started on colchicine next day after discharge by Dr.Ross for possible pleuropericarditis.  No recurrent chest pain since then.  Given AKI reduce colchicine to 0.6 mg daily>> she will restart twice daily dose if recurrent pain.  2. CAD s/p DES to mLAD -No angina.  Continue aspirin, beta-blocker, Brilinta and statin.   3. HTN -Blood pressure stable.  Medication changes as below.  4. HLD - 07/04/2019: Cholesterol 197; HDL 47; LDL Cholesterol 113; Triglycerides 183; VLDL 37  -Continue high intensity statin.  Repeat lab in few weeks  5. AKI -Serum creatinine stable at 1.7.  However it was normal prior to hospital presentation.  Ibesartan held in hospital.  Stop spironolactone.  Increase Coreg to 6.25 mg twice daily to help blood pressure.  Reduce colchicine as above. -Recheck renal function in few weeks.  6.  Diabetes mellitus -Hemoglobin A1c 7.5 -She previously did not tolerated Metformin, currently on Farxiga which followed by PCP.    Medication Adjustments/Labs and Tests Ordered: Current medicines are reviewed at  length with the patient today.  Concerns regarding medicines are outlined above.  Medication changes, Labs and Tests ordered today are listed in the Patient Instructions below. Patient Instructions  Medication Instructions:   1.) STOP: Spironolactone   2.) INCREASE: Carvedilol to 6.25 mg twice a day  3.) DECREASE: Colchicine to once a day   *If you need a refill on your cardiac medications before your next appointment, please call your pharmacy*  Lab Work: FUTURE: BMET on 07/22/2019 (Lab is open from 7:30 AM to 4:30 PM)  If you have labs (blood work) drawn today and your tests are completely normal, you will receive your results only by: Marland Kitchen MyChart Message (if you have MyChart) OR . A paper copy in the mail If you have any lab test that is abnormal or we need to change your treatment, we will call you to review the results.  Testing/Procedures: None   Follow-Up: You are scheduled for a virtual visit with Vin Brahim Dolman PA-C on 07/24/2019 @ 8:00 AM, Someone will call you 10-15 mins before your appointment. Please have your blood pressure and any vitals your able to obtain ready prior to the call.   Other Instructions None      Lorelei Pont, [place]  07/11/2019 9:36 AM    [facility] [address] [place], [place], [place]  [address] Phone: [phone]; Fax: [phone]

## 2019-07-11 ENCOUNTER — Other Ambulatory Visit: Payer: Self-pay

## 2019-07-11 ENCOUNTER — Ambulatory Visit (INDEPENDENT_AMBULATORY_CARE_PROVIDER_SITE_OTHER): Payer: BC Managed Care – PPO | Admitting: Physician Assistant

## 2019-07-11 ENCOUNTER — Encounter: Payer: Self-pay | Admitting: Physician Assistant

## 2019-07-11 ENCOUNTER — Telehealth: Payer: Self-pay

## 2019-07-11 VITALS — BP 126/76 | HR 83 | Ht 63.0 in | Wt 187.0 lb

## 2019-07-11 DIAGNOSIS — I10 Essential (primary) hypertension: Secondary | ICD-10-CM | POA: Diagnosis not present

## 2019-07-11 DIAGNOSIS — E119 Type 2 diabetes mellitus without complications: Secondary | ICD-10-CM

## 2019-07-11 DIAGNOSIS — N179 Acute kidney failure, unspecified: Secondary | ICD-10-CM

## 2019-07-11 DIAGNOSIS — I2510 Atherosclerotic heart disease of native coronary artery without angina pectoris: Secondary | ICD-10-CM

## 2019-07-11 DIAGNOSIS — E785 Hyperlipidemia, unspecified: Secondary | ICD-10-CM

## 2019-07-11 DIAGNOSIS — I309 Acute pericarditis, unspecified: Secondary | ICD-10-CM | POA: Diagnosis not present

## 2019-07-11 MED ORDER — CARVEDILOL 6.25 MG PO TABS: 6.2500 mg | ORAL_TABLET | Freq: Two times a day (BID) | ORAL | 3 refills | Status: DC

## 2019-07-11 MED ORDER — COLCHICINE 0.6 MG PO TABS: 0.6000 mg | ORAL_TABLET | Freq: Every day | ORAL | 3 refills | Status: DC

## 2019-07-11 NOTE — Telephone Encounter (Signed)
YOUR CARDIOLOGY TEAM HAS ARRANGED FOR AN E-VISIT FOR YOUR APPOINTMENT - PLEASE REVIEW IMPORTANT INFORMATION BELOW SEVERAL DAYS PRIOR TO YOUR APPOINTMENT  Due to the recent COVID-19 pandemic, we are transitioning in-person office visits to tele-medicine visits in an effort to decrease unnecessary exposure to our patients, their families, and staff. These visits are billed to your insurance just like a normal visit is. We also encourage you to sign up for MyChart if you have not already done so. You will need a smartphone if possible. For patients that do not have this, we can still complete the visit using a regular telephone but do prefer a smartphone to enable video when possible. You may have a family member that lives with you that can help. If possible, we also ask that you have a blood pressure cuff and scale at home to measure your blood pressure, heart rate and weight prior to your scheduled appointment. Patients with clinical needs that need an in-person evaluation and testing will still be able to come to the office if absolutely necessary. If you have any questions, feel free to call our office.     YOUR PROVIDER WILL BE USING THE FOLLOWING PLATFORM TO COMPLETE YOUR VISIT: Doximity  . IF USING MYCHART - How to Download the MyChart App to Your SmartPhone   - If Apple, go to App Store and type in MyChart in the search bar and download the app. If Android, ask patient to go to Google Play Store and type in MyChart in the search bar and download the app. The app is free but as with any other app downloads, your phone may require you to verify saved payment information or Apple/Android password.  - You will need to then log into the app with your MyChart username and password, and select [facility] as your healthcare provider to link the account.  - When it is time for your visit, go to the MyChart app, find appointments, and click Begin Video Visit. Be sure to Select Allow for your device to  access the Microphone and Camera for your visit. You will then be connected, and your provider will be with you shortly.  **If you have any issues connecting or need assistance, please contact MyChart service desk [phone] ([phone])**  **If using a computer, in order to ensure the best quality for your visit, you will need to use either of the following Internet Browsers: Google Chrome or Microsoft Edge**  . IF USING DOXIMITY or DOXY.ME - The staff will give you instructions on receiving your link to join the meeting the day of your visit.      2-3 DAYS BEFORE YOUR APPOINTMENT  You will receive a telephone call from one of our HeartCare team members - your caller ID may say "Unknown caller." If this is a video visit, we will walk you through how to get the video launched on your phone. We will remind you check your blood pressure, heart rate and weight prior to your scheduled appointment. If you have an Apple Watch or Kardia, please upload any pertinent ECG strips the day before or morning of your appointment to MyChart. Our staff will also make sure you have reviewed the consent and agree to move forward with your scheduled tele-health visit.     THE DAY OF YOUR APPOINTMENT  Approximately 15 minutes prior to your scheduled appointment, you will receive a telephone call from one of HeartCare team - your caller ID may say "Unknown caller."    Our staff will confirm medications, vital signs for the day and any symptoms you may be experiencing. Please have this information available prior to the time of visit start. It may also be helpful for you to have a pad of paper and pen handy for any instructions given during your visit. They will also walk you through joining the smartphone meeting if this is a video visit.    CONSENT FOR TELE-HEALTH VISIT - PLEASE REVIEW  I hereby voluntarily request, consent and authorize [facility] and its employed or contracted physicians, physician  assistants, nurse practitioners or other licensed health care professionals (the Practitioner), to provide me with telemedicine health care services (the "Services") as deemed necessary by the treating Practitioner. I acknowledge and consent to receive the Services by the Practitioner via telemedicine. I understand that the telemedicine visit will involve communicating with the Practitioner through live audiovisual communication technology and the disclosure of certain medical information by electronic transmission. I acknowledge that I have been given the opportunity to request an in-person assessment or other available alternative prior to the telemedicine visit and am voluntarily participating in the telemedicine visit.  I understand that I have the right to withhold or withdraw my consent to the use of telemedicine in the course of my care at any time, without affecting my right to future care or treatment, and that the Practitioner or I may terminate the telemedicine visit at any time. I understand that I have the right to inspect all information obtained and/or recorded in the course of the telemedicine visit and may receive copies of available information for a reasonable fee.  I understand that some of the potential risks of receiving the Services via telemedicine include:  . Delay or interruption in medical evaluation due to technological equipment failure or disruption; . Information transmitted may not be sufficient (e.g. poor resolution of images) to allow for appropriate medical decision making by the Practitioner; and/or  . In rare instances, security protocols could fail, causing a breach of personal health information.  Furthermore, I acknowledge that it is my responsibility to provide information about my medical history, conditions and care that is complete and accurate to the best of my ability. I acknowledge that Practitioner's advice, recommendations, and/or decision may be based on  factors not within their control, such as incomplete or inaccurate data provided by me or distortions of diagnostic images or specimens that may result from electronic transmissions. I understand that the practice of medicine is not an exact science and that Practitioner makes no warranties or guarantees regarding treatment outcomes. I acknowledge that I will receive a copy of this consent concurrently upon execution via email to the email address I last provided but may also request a printed copy by calling the office of [facility].    I understand that my insurance will be billed for this visit.   I have read or had this consent read to me. . I understand the contents of this consent, which adequately explains the benefits and risks of the Services being provided via telemedicine.  . I have been provided ample opportunity to ask questions regarding this consent and the Services and have had my questions answered to my satisfaction. . I give my informed consent for the services to be provided through the use of telemedicine in my medical care  By participating in this telemedicine visit I agree to the above.  

## 2019-07-11 NOTE — Patient Instructions (Signed)
Medication Instructions:   1.) STOP: Spironolactone   2.) INCREASE: Carvedilol to 6.25 mg twice a day   3.) DECREASE: Colchicine to once a day   *If you need a refill on your cardiac medications before your next appointment, please call your pharmacy*  Lab Work: FUTURE: BMET on 07/22/2019 (Lab is open from 7:30 AM to 4:30 PM)  If you have labs (blood work) drawn today and your tests are completely normal, you will receive your results only by: Marland Kitchen MyChart Message (if you have MyChart) OR . A paper copy in the mail If you have any lab test that is abnormal or we need to change your treatment, we will call you to review the results.  Testing/Procedures: None   Follow-Up: You are scheduled for a virtual visit with Vin Bhagat PA-C on 07/24/2019 @ 8:00 AM, Someone will call you 10-15 mins before your appointment. Please have your blood pressure and any vitals your able to obtain ready prior to the call.   Other Instructions None

## 2019-07-12 DIAGNOSIS — E1121 Type 2 diabetes mellitus with diabetic nephropathy: Secondary | ICD-10-CM | POA: Diagnosis not present

## 2019-07-12 DIAGNOSIS — I214 Non-ST elevation (NSTEMI) myocardial infarction: Secondary | ICD-10-CM | POA: Diagnosis not present

## 2019-07-12 DIAGNOSIS — F3341 Major depressive disorder, recurrent, in partial remission: Secondary | ICD-10-CM | POA: Diagnosis not present

## 2019-07-12 DIAGNOSIS — N1831 Chronic kidney disease, stage 3a: Secondary | ICD-10-CM | POA: Diagnosis not present

## 2019-07-22 ENCOUNTER — Other Ambulatory Visit: Payer: Self-pay

## 2019-07-22 ENCOUNTER — Other Ambulatory Visit: Payer: BC Managed Care – PPO

## 2019-07-22 DIAGNOSIS — I10 Essential (primary) hypertension: Secondary | ICD-10-CM

## 2019-07-22 NOTE — Telephone Encounter (Signed)
ERROR

## 2019-07-23 ENCOUNTER — Other Ambulatory Visit: Payer: BC Managed Care – PPO

## 2019-07-23 ENCOUNTER — Telehealth (HOSPITAL_COMMUNITY): Payer: Self-pay | Admitting: Pharmacist

## 2019-07-23 LAB — BASIC METABOLIC PANEL
BUN/Creatinine Ratio: 11 (ref 9–23)
BUN: 20 mg/dL (ref 6–24)
CO2: 25 mmol/L (ref 20–29)
Calcium: 9.3 mg/dL (ref 8.7–10.2)
Chloride: 104 mmol/L (ref 96–106)
Creatinine, Ser: 1.79 mg/dL — ABNORMAL HIGH (ref 0.57–1.00)
GFR calc Af Amer: 36 mL/min/{1.73_m2} — ABNORMAL LOW (ref >59)
GFR calc non Af Amer: 31 mL/min/{1.73_m2} — ABNORMAL LOW (ref >59)
Glucose: 115 mg/dL — ABNORMAL HIGH (ref 65–99)
Potassium: 3.8 mmol/L (ref 3.5–5.2)
Sodium: 141 mmol/L (ref 134–144)

## 2019-07-23 NOTE — Telephone Encounter (Signed)
Cardiac Rehab Medication Review by a Pharmacist  Does the patient feel that his/her medications are working for him/her?  yes  Has the patient been experiencing any side effects to the medications prescribed?  no  Does the patient measure his/her own blood pressure or blood glucose at home?  no   Does the patient have any problems obtaining medications due to transportation or finances?   no  Understanding of regimen: excellent Understanding of indications: excellent Potential of compliance: excellent   Pharmacist comments: N/A  Anzel Kearse, PharmD PGY1 Ambulatory Care Resident [employer] # [phone]    

## 2019-07-23 NOTE — Progress Notes (Signed)
Virtual Visit via Video Note   This visit type was conducted due to national recommendations for restrictions regarding the COVID-19 Pandemic (e.g. social distancing) in an effort to limit this patient's exposure and mitigate transmission in our community.  Due to her co-morbid illnesses, this patient is at least at moderate risk for complications without adequate follow up.  This format is felt to be most appropriate for this patient at this time.  All issues noted in this document were discussed and addressed.  A limited physical exam was performed with this format.  Please refer to the patient's chart for her consent to telehealth for [facility].   Date:  07/24/2019   ID:  Andrea Robertson, DOB October 15, 1960, MRN [id number]  Patient Location: Home Provider Location: Home  PCP:  Lorenda Ishihara, MD  Cardiologist:  Tobias Alexander, MD  Electrophysiologist:  None   Evaluation Performed:  Follow-Up Visit  Chief Complaint:  2 weeks follow up  History of Present Illness:    Andrea Robertson is a 58 y.o. female with hx of CAD s/p DES to mLAD, post infract pericarditis, HTN, HLD, diabetes mellitus and HLD seen for follow up.  Admitted 06/2019 with NSTEMI. EKG wth dynamic changes, minimal ST elevation inferior leads. HS troponin peakedat 22,166. Cath showed total occlusion of mid LAD, mild nonobstructive stenosis of LCx and RCA, treated with PCI to mid LAD. Echoshowed EF 50-55%, G1DD, trivial pericardial effusion.had AKI and valsartan held. Sed rateandCRPelevated>> but no treatment. Seen  In clinic by Dr. Tenny Craw next day after discharge for CP which felt non ischemic. Could be pericarditis. Started on colchicine and Tylenol with resolution of symptoms. Due to AKI with SCr of 1.7 stopped Irbesartan and spironolactone. Reduce colchicine to daily. Scr remain stable at 1.7.  Seen today for follow up.  No recurrent chest pain.  However since last office visit she is dealing with  intermittent palpitation.  Her palpitation mostly occurs with exertion or laying down.  Associated with shortness of breath.  Last for less than 20 seconds with self resolution.  She drinks 1 cup of coffee every other day.  No shoulder.  She is walking 15 minutes every day without chest pain and her palpitation occurs intermittently.  She does have a longstanding history of PVCs.  The patient does not have symptoms concerning for COVID-19 infection (fever, chills, cough, or new shortness of breath).    Past Medical History:  Diagnosis Date  . Arthritis   . Coronary artery disease   . Depression   . Diabetes mellitus without complication (HCC)   . GERD (gastroesophageal reflux disease)   . Headache    sinus headaches  . Hypertension   . PVC's (premature ventricular contractions)    Past Surgical History:  Procedure Laterality Date  . ABDOMINAL HYSTERECTOMY    . CHOLECYSTECTOMY    . CORONARY STENT INTERVENTION N/A 07/03/2019   Procedure: CORONARY STENT INTERVENTION;  Surgeon: Tonny Bollman, MD;  Location: [facility] INVASIVE CV LAB;  Service: Cardiovascular;  Laterality: N/A;  . LEFT HEART CATH AND CORONARY ANGIOGRAPHY N/A 07/03/2019   Procedure: LEFT HEART CATH AND CORONARY ANGIOGRAPHY;  Surgeon: Tonny Bollman, MD;  Location: [facility] INVASIVE CV LAB;  Service: Cardiovascular;  Laterality: N/A;  . TOTAL KNEE ARTHROPLASTY Right 10/13/2015   Procedure: RIGHT TOTAL KNEE ARTHROPLASTY;  Surgeon: Ranee Gosselin, MD;  Location: [facility] ORS;  Service: Orthopedics;  Laterality: Right;     Current Meds  Medication Sig  . acetaminophen (TYLENOL) 500 MG tablet  Take 1,000 mg by mouth every 6 (six) hours as needed for mild pain or headache.  Marland Kitchen aspirin EC 81 MG EC tablet Take 1 tablet (81 mg total) by mouth daily.  Marland Kitchen atorvastatin (LIPITOR) 80 MG tablet Take 1 tablet (80 mg total) by mouth daily at 6 PM.  . buPROPion (WELLBUTRIN XL) 300 MG 24 hr tablet Take 300 mg by mouth daily.  . carvedilol (COREG) 6.25 MG  tablet Take 1 tablet (6.25 mg total) by mouth 2 (two) times daily with a meal.  . colchicine 0.6 MG tablet Take 1 tablet (0.6 mg total) by mouth daily.  Marland Kitchen escitalopram (LEXAPRO) 10 MG tablet Take 10 mg by mouth daily.  Marland Kitchen FARXIGA 5 MG TABS tablet Take 5 mg by mouth daily.  . fluticasone (FLONASE) 50 MCG/ACT nasal spray Place 1 spray into both nostrils daily as needed for allergies or rhinitis.  Marland Kitchen meclizine (ANTIVERT) 25 MG tablet Take 1 tablet (25 mg total) by mouth 3 (three) times daily as needed for dizziness.  . Multiple Vitamin (MULTIVITAMIN WITH MINERALS) TABS tablet Take 1 tablet by mouth daily.  . nitroGLYCERIN (NITROSTAT) 0.4 MG SL tablet Place 1 tablet (0.4 mg total) under the tongue every 5 (five) minutes x 3 doses as needed for chest pain.  . ticagrelor (BRILINTA) 90 MG TABS tablet Take 1 tablet (90 mg total) by mouth 2 (two) times daily.     Allergies:   Dilaudid [hydromorphone hcl], Erythromycin, Penicillins, Percocet [oxycodone-acetaminophen], and Zithromax [azithromycin]   Social History   Tobacco Use  . Smoking status: Never Smoker  . Smokeless tobacco: Never Used  Substance Use Topics  . Alcohol use: Yes    Comment: Social drinker  . Drug use: No     Family Hx: The patient's family history includes CAD in her father.  ROS:   Please see the history of present illness.    All other systems reviewed and are negative.   Prior CV studies:   The following studies were reviewed today:  Echocardiogram: 07/03/2019 1. Left ventricular ejection fraction, by visual estimation, is 50 to 55%. The left ventricle has normal function. Left ventricular septal wall thickness was mildly increased. There is no left ventricular hypertrophy. 2. Left ventricular diastolic parameters are consistent with Grade I diastolic dysfunction (impaired relaxation). 3. Hypokinesis of the mid to apical anteroseptum, apical anterior, and apical myocardium. 4. Global right ventricle has normal  systolic function.The right ventricular size is normal. No increase in right ventricular wall thickness. 5. Left atrial size was normal. 6. Right atrial size was normal. 7. Trivial pericardial effusion is present. 8. The mitral valve is normal in structure. No evidence of mitral valve regurgitation. No evidence of mitral stenosis. 9. The tricuspid valve is normal in structure. Tricuspid valve regurgitation is trivial. 10. The aortic valve is tricuspid. Aortic valve regurgitation is trivial. No evidence of aortic valve sclerosis or stenosis. 11. The pulmonic valve was normal in structure. Pulmonic valve regurgitation is trivial. 12. Normal pulmonary artery systolic pressure. 13. The inferior vena cava is normal in size with greater than 50% respiratory variability, suggesting right atrial pressure of 3 mmHg.  CORONARY STENT INTERVENTION  07/03/2019  LEFT HEART CATH AND CORONARY ANGIOGRAPHY  Conclusion  1. Total occlusion of the mid-LAD, treated successfully with PCI using a 2.5x20 mm Synergy DES 2. Mild nonobstructive stenosis of the LCx and RCA 3. Mildly elevated LVEDP  Recommend: aggressive medical therapy, DAPT with ASA and ticagrelor x 12 months without interruption  Labs/Other Tests and Data Reviewed:    EKG:  No ECG reviewed.  Recent Labs: 07/09/2019: Hemoglobin 11.6; Platelets 398 07/22/2019: BUN 20; Creatinine, Ser 1.79; Potassium 3.8; Sodium 141   Recent Lipid Panel Lab Results  Component Value Date/Time   CHOL 197 07/04/2019 09:12 AM   TRIG 183 (H) 07/04/2019 09:12 AM   HDL 47 07/04/2019 09:12 AM   CHOLHDL 4.2 07/04/2019 09:12 AM   LDLCALC 113 (H) 07/04/2019 09:12 AM    Wt Readings from Last 3 Encounters:  07/24/19 183 lb (83 kg)  07/11/19 187 lb (84.8 kg)  07/05/19 188 lb 12.8 oz (85.6 kg)     Objective:    Vital Signs:  BP 138/86   Pulse 100   Ht 5\' 3"  (1.6 m)   Wt 183 lb (83 kg)   BMI 32.42 kg/m    VITAL SIGNS:  reviewed GEN:  no acute  distress EYES:  sclerae anicteric, EOMI - Extraocular Movements Intact RESPIRATORY:  normal respiratory effort, symmetric expansion CARDIOVASCULAR:  no peripheral edema SKIN:  no rash, lesions or ulcers. MUSCULOSKELETAL:  no obvious deformities. NEURO:  alert and oriented x 3, no obvious focal deficit PSYCH:  normal affect  ASSESSMENT & PLAN:    1. CAD No angina.  Continue walking regimen.  She will enroll in cardiac rehab later this month.  Continue medical therapy with aspirin, Brilinta, statin and beta-blocker.  2. Post infract pericarditis No recurrent pain.  Will stop colchicine.  3. HTN Blood pressure stable.  Increase Coreg as below.  4. HLD -Continue high intensity statin  5. AKI - Scr remain at 1.7. It was normal prior to admission.  Her renal function was normal in March.  She was on spironolactone prior to presentation which was discontinued in hospital due to elevated renal function.  6.  Palpitation Patient reports longstanding history of PVC.  Symptoms mostly occurs during exertion or laying.  Occasionally associated with shortness of breath.  Self resolved within 20 seconds.  Advised complete cessation of caffeine product.  Check TSH.  Increase Coreg for better rate control.  If does not improve will get monitor.  If she has a persistent palpitations, she will go to ER for further evaluation.   COVID-19 Education: The signs and symptoms of COVID-19 were discussed with the patient and how to seek care for testing (follow up with PCP or arrange E-visit).  The importance of social distancing was discussed today.  Time:   Today, I have spent 17 minutes with the patient with telehealth technology discussing the above problems.     Medication Adjustments/Labs and Tests Ordered: Current medicines are reviewed at length with the patient today.  Concerns regarding medicines are outlined above.   Tests Ordered: No orders of the defined types were placed in this encounter.    Medication Changes: No orders of the defined types were placed in this encounter.   Follow Up:  Virtual Visit  in 1 week(s)  Signed, Leanor Kail, PA  07/24/2019 8:10 AM    [place]

## 2019-07-24 ENCOUNTER — Telehealth (INDEPENDENT_AMBULATORY_CARE_PROVIDER_SITE_OTHER): Payer: BC Managed Care – PPO | Admitting: Physician Assistant

## 2019-07-24 ENCOUNTER — Encounter: Payer: Self-pay | Admitting: Physician Assistant

## 2019-07-24 ENCOUNTER — Other Ambulatory Visit: Payer: Self-pay

## 2019-07-24 VITALS — BP 138/86 | HR 100 | Ht 63.0 in | Wt 183.0 lb

## 2019-07-24 DIAGNOSIS — R002 Palpitations: Secondary | ICD-10-CM

## 2019-07-24 DIAGNOSIS — E785 Hyperlipidemia, unspecified: Secondary | ICD-10-CM

## 2019-07-24 DIAGNOSIS — I2510 Atherosclerotic heart disease of native coronary artery without angina pectoris: Secondary | ICD-10-CM

## 2019-07-24 DIAGNOSIS — N179 Acute kidney failure, unspecified: Secondary | ICD-10-CM

## 2019-07-24 DIAGNOSIS — I309 Acute pericarditis, unspecified: Secondary | ICD-10-CM

## 2019-07-24 MED ORDER — CARVEDILOL 12.5 MG PO TABS: 12.5000 mg | ORAL_TABLET | Freq: Two times a day (BID) | ORAL | 3 refills | Status: DC

## 2019-07-24 NOTE — Patient Instructions (Signed)
Medication Instructions:  Your physician has recommended you make the following change in your medication:  1.  INCREASE the Carvedilol to 12.5 mg taking 1 tablet twice a day.  I have sent in a prescription for this dose to [facility]. 2.  STOP Colchicine   *If you need a refill on your cardiac medications before your next appointment, please call your pharmacy*  Lab Work: 07/25/2019:  COME TO THE OFFICE FOR A TSH  If you have labs (blood work) drawn today and your tests are completely normal, you will receive your results only by: Marland Kitchen MyChart Message (if you have MyChart) OR . A paper copy in the mail If you have any lab test that is abnormal or we need to change your treatment, we will call you to review the results.  Testing/Procedures: None ordered  Follow-Up: At [facility], you and your health needs are our priority.  As part of our continuing mission to provide you with exceptional heart care, we have created designated Provider Care Teams.  These Care Teams include your primary Cardiologist (physician) and Advanced Practice Providers (APPs -  Physician Assistants and Nurse Practitioners) who all work together to provide you with the care you need, when you need it.  Your next appointment:   1 week(s)  08/01/2019 at 8:00 a.m  3 MONTHS IN PERSON WITH DR. Meda Coffee SOMEONE WILL CALL YOU WITH THIS APPOINTMNET  The format for your next appointment:   Virtual Visit   Provider:   Robbie Lis, PA-C  Other Instructions

## 2019-07-25 ENCOUNTER — Other Ambulatory Visit: Payer: BC Managed Care – PPO | Admitting: *Deleted

## 2019-07-25 ENCOUNTER — Other Ambulatory Visit: Payer: Self-pay

## 2019-07-25 DIAGNOSIS — R002 Palpitations: Secondary | ICD-10-CM | POA: Diagnosis not present

## 2019-07-25 LAB — TSH: TSH: 1.36 u[IU]/mL (ref 0.450–4.500)

## 2019-07-26 ENCOUNTER — Emergency Department (HOSPITAL_COMMUNITY): Payer: BC Managed Care – PPO

## 2019-07-26 ENCOUNTER — Encounter (HOSPITAL_COMMUNITY): Payer: Self-pay | Admitting: Emergency Medicine

## 2019-07-26 ENCOUNTER — Emergency Department (HOSPITAL_COMMUNITY)
Admission: EM | Admit: 2019-07-26 | Discharge: 2019-07-26 | Disposition: A | Discharge status: Home / Self Care | Payer: BC Managed Care – PPO | Attending: Emergency Medicine | Admitting: Emergency Medicine

## 2019-07-26 ENCOUNTER — Telehealth: Payer: Self-pay | Admitting: Cardiology

## 2019-07-26 ENCOUNTER — Other Ambulatory Visit: Payer: Self-pay | Admitting: Cardiology

## 2019-07-26 ENCOUNTER — Other Ambulatory Visit: Payer: Self-pay

## 2019-07-26 DIAGNOSIS — Z96651 Presence of right artificial knee joint: Secondary | ICD-10-CM | POA: Diagnosis not present

## 2019-07-26 DIAGNOSIS — R072 Precordial pain: Secondary | ICD-10-CM

## 2019-07-26 DIAGNOSIS — R42 Dizziness and giddiness: Secondary | ICD-10-CM | POA: Diagnosis not present

## 2019-07-26 DIAGNOSIS — I252 Old myocardial infarction: Secondary | ICD-10-CM | POA: Insufficient documentation

## 2019-07-26 DIAGNOSIS — R0602 Shortness of breath: Secondary | ICD-10-CM

## 2019-07-26 DIAGNOSIS — R079 Chest pain, unspecified: Secondary | ICD-10-CM | POA: Diagnosis not present

## 2019-07-26 DIAGNOSIS — E119 Type 2 diabetes mellitus without complications: Secondary | ICD-10-CM | POA: Diagnosis not present

## 2019-07-26 DIAGNOSIS — I4581 Long QT syndrome: Secondary | ICD-10-CM | POA: Diagnosis not present

## 2019-07-26 DIAGNOSIS — I10 Essential (primary) hypertension: Secondary | ICD-10-CM | POA: Insufficient documentation

## 2019-07-26 DIAGNOSIS — Z955 Presence of coronary angioplasty implant and graft: Secondary | ICD-10-CM | POA: Insufficient documentation

## 2019-07-26 DIAGNOSIS — I2510 Atherosclerotic heart disease of native coronary artery without angina pectoris: Secondary | ICD-10-CM | POA: Insufficient documentation

## 2019-07-26 DIAGNOSIS — R002 Palpitations: Secondary | ICD-10-CM | POA: Insufficient documentation

## 2019-07-26 DIAGNOSIS — Z7982 Long term (current) use of aspirin: Secondary | ICD-10-CM | POA: Diagnosis not present

## 2019-07-26 DIAGNOSIS — R0609 Other forms of dyspnea: Secondary | ICD-10-CM | POA: Insufficient documentation

## 2019-07-26 DIAGNOSIS — R519 Headache, unspecified: Secondary | ICD-10-CM | POA: Diagnosis not present

## 2019-07-26 DIAGNOSIS — I25118 Atherosclerotic heart disease of native coronary artery with other forms of angina pectoris: Secondary | ICD-10-CM

## 2019-07-26 DIAGNOSIS — R9431 Abnormal electrocardiogram [ECG] [EKG]: Secondary | ICD-10-CM | POA: Diagnosis not present

## 2019-07-26 DIAGNOSIS — Z79899 Other long term (current) drug therapy: Secondary | ICD-10-CM | POA: Diagnosis not present

## 2019-07-26 DIAGNOSIS — R0789 Other chest pain: Secondary | ICD-10-CM | POA: Diagnosis not present

## 2019-07-26 DIAGNOSIS — I493 Ventricular premature depolarization: Secondary | ICD-10-CM

## 2019-07-26 DIAGNOSIS — R000 Tachycardia, unspecified: Secondary | ICD-10-CM

## 2019-07-26 LAB — TROPONIN I (HIGH SENSITIVITY)
Troponin I (High Sensitivity): 20 ng/L — ABNORMAL HIGH (ref <18)
Troponin I (High Sensitivity): 20 ng/L — ABNORMAL HIGH (ref <18)

## 2019-07-26 LAB — BASIC METABOLIC PANEL
Anion gap: 10 (ref 5–15)
BUN: 20 mg/dL (ref 6–20)
CO2: 22 mmol/L (ref 22–32)
Calcium: 9 mg/dL (ref 8.9–10.3)
Chloride: 105 mmol/L (ref 98–111)
Creatinine, Ser: 1.62 mg/dL — ABNORMAL HIGH (ref 0.44–1.00)
GFR calc Af Amer: 41 mL/min — ABNORMAL LOW (ref >60)
GFR calc non Af Amer: 35 mL/min — ABNORMAL LOW (ref >60)
Glucose, Bld: 121 mg/dL — ABNORMAL HIGH (ref 70–99)
Potassium: 3.6 mmol/L (ref 3.5–5.1)
Sodium: 137 mmol/L (ref 135–145)

## 2019-07-26 LAB — CBC
HCT: 33.3 % — ABNORMAL LOW (ref 36.0–46.0)
Hemoglobin: 11.1 g/dL — ABNORMAL LOW (ref 12.0–15.0)
MCH: 28.2 pg (ref 26.0–34.0)
MCHC: 33.3 g/dL (ref 30.0–36.0)
MCV: 84.7 fL (ref 80.0–100.0)
Platelets: 337 10*3/uL (ref 150–400)
RBC: 3.93 MIL/uL (ref 3.87–5.11)
RDW: 14 % (ref 11.5–15.5)
WBC: 10.4 10*3/uL (ref 4.0–10.5)
nRBC: 0 % (ref 0.0–0.2)

## 2019-07-26 LAB — PROTIME-INR
INR: 1 (ref 0.8–1.2)
Prothrombin Time: 13.4 seconds (ref 11.4–15.2)

## 2019-07-26 LAB — D-DIMER, QUANTITATIVE: D-Dimer, Quant: 1.12 ug/mL-FEU — ABNORMAL HIGH (ref 0.00–0.50)

## 2019-07-26 LAB — BRAIN NATRIURETIC PEPTIDE: B Natriuretic Peptide: 212.5 pg/mL — ABNORMAL HIGH (ref 0.0–100.0)

## 2019-07-26 MED ORDER — BUTALBITAL-APAP-CAFFEINE 50-325-40 MG PO TABS
1.0000 | ORAL_TABLET | Freq: Once | ORAL | Status: AC
  Administered 2019-07-26: 1 via ORAL
  Filled 2019-07-26: qty 1

## 2019-07-26 MED ORDER — IOHEXOL 350 MG/ML SOLN
75.0000 mL | Freq: Once | INTRAVENOUS | Status: AC | PRN
  Administered 2019-07-26: 55 mL via INTRAVENOUS

## 2019-07-26 MED ORDER — ACETAMINOPHEN 500 MG PO TABS
1000.0000 mg | ORAL_TABLET | Freq: Once | ORAL | Status: AC
  Administered 2019-07-26: 1000 mg via ORAL
  Filled 2019-07-26: qty 2

## 2019-07-26 MED ORDER — LORAZEPAM 2 MG/ML IJ SOLN
1.0000 mg | Freq: Once | INTRAMUSCULAR | Status: AC
  Administered 2019-07-26: 1 mg via INTRAVENOUS
  Filled 2019-07-26: qty 1

## 2019-07-26 MED ORDER — SODIUM CHLORIDE 0.9% FLUSH: 3.0000 mL | Freq: Once | INTRAVENOUS | Status: DC

## 2019-07-26 NOTE — ED Notes (Signed)
Patient transported to CT 

## 2019-07-26 NOTE — Telephone Encounter (Signed)
Per Cecilie Kicks, patient needs to be scheduled for monitor. She is in the hospital.

## 2019-07-26 NOTE — ED Notes (Signed)
Patient being transported to CT

## 2019-07-26 NOTE — Discharge Instructions (Addendum)
Follow-up with your cardiologist as scheduled. Return to the emergency room with any new, worsening, concerning symptoms.

## 2019-07-26 NOTE — ED Notes (Signed)
Pt returned to from [place] staff unable to start IV.

## 2019-07-26 NOTE — Consult Note (Signed)
Cardiology Consultation:   Patient ID: Andrea Robertson MRN: [id number]; DOB: Dec 27, 1960  Admit date: 07/26/2019 Date of Consult: 07/26/2019  Primary Care Provider: Lorenda Ishihara, MD Primary Cardiologist: Tobias Alexander, MD  Primary Electrophysiologist:  None    Patient Profile:   Andrea Robertson is a 58 y.o. female with a hx of DM-2, HTN,  obesity then 07/03/19 NSTEMI with PCI to LAD who is being seen today for the evaluation of chest pain at the request of Dr Lockie Mola.  History of Present Illness:   [name] with prior hx of DM-2 and HTN and recent admit 07/03/19 with NSTEMI and findings on cath of 100% LAD undergoing PCI to mLAD and residual mild disease 30% RCA.  EF by echo 50 to 55%. Cr was mildly elevated at discharge so valsartan held.    Pt discharged 07/04/19 and then seen in office 07/05/19 Lt sided chest pain.  Increased with deep breath and worse with laying down. No pericardial rub on exam.  Colchicine added and tylenol.   Seen in office 07/11/19 and pain resolved with colchicine.    BP elevated and coreg increased.   Telehealth visit 07/24/19 and no pain but complaints of palpitations.  She has hx of PVCs, and instructed to stop caffeine.  Today presented to ER by EMS with chest pain and palpitations and SOB.  She rec'd ASA 324 and NTG prior to arrival.    She felt fine until she woke read a text message - her mom is now in the hospital.  She went to BR then back in bed heart was racing not PVCs but rapid HR and she was SOB then developed chest pain, mid sternal not as bad as with MI and different than pericarditis pain.   She has just finished colchicine. No diaphoresis, no nausea then.    Currently with headache with NTG.  Some nausea.    EKG:  The EKG was personally reviewed and demonstrates:  SR with T wave inversion, I, II, III, AVF and V3-6 this is new from EKG of 07/05/19 and 07/04/19 Telemetry:  Telemetry was personally reviewed and demonstrates:   SR to SB rare PVC  Na 137, K+ 3.6 Cr 1.62 improved from 1.79 on the 11/30 Hgb 11.1, WBC 10.4 TSH 1.360 Troponin hs 20; 20   2V CXR IMPRESSION: No acute cardiopulmonary abnormality. Mild cardiomegaly    Heart Pathway Score:     Past Medical History:  Diagnosis Date  . Arthritis   . Coronary artery disease   . Depression   . Diabetes mellitus without complication (HCC)   . GERD (gastroesophageal reflux disease)   . Headache    sinus headaches  . Hypertension   . PVC's (premature ventricular contractions)     Past Surgical History:  Procedure Laterality Date  . ABDOMINAL HYSTERECTOMY    . CHOLECYSTECTOMY    . CORONARY STENT INTERVENTION N/A 07/03/2019   Procedure: CORONARY STENT INTERVENTION;  Surgeon: Tonny Bollman, MD;  Location: [facility] INVASIVE CV LAB;  Service: Cardiovascular;  Laterality: N/A;  . LEFT HEART CATH AND CORONARY ANGIOGRAPHY N/A 07/03/2019   Procedure: LEFT HEART CATH AND CORONARY ANGIOGRAPHY;  Surgeon: Tonny Bollman, MD;  Location: [facility] INVASIVE CV LAB;  Service: Cardiovascular;  Laterality: N/A;  . TOTAL KNEE ARTHROPLASTY Right 10/13/2015   Procedure: RIGHT TOTAL KNEE ARTHROPLASTY;  Surgeon: Ranee Gosselin, MD;  Location: [facility];  Service: Orthopedics;  Laterality: Right;     Home Medications:  Prior to Admission medications   Medication  Sig Start Date End Date Taking? Authorizing Provider  acetaminophen (TYLENOL) 500 MG tablet Take 1,000 mg by mouth every 6 (six) hours as needed for mild pain or headache.    [provider]  aspirin EC 81 MG EC tablet Take 1 tablet (81 mg total) by mouth daily. 07/04/19   Furth, Cadence H, PA-C  atorvastatin (LIPITOR) 80 MG tablet Take 1 tablet (80 mg total) by mouth daily at 6 PM. 07/04/19   Furth, Cadence H, PA-C  buPROPion (WELLBUTRIN XL) 300 MG 24 hr tablet Take 300 mg by mouth daily.    [provider]  carvedilol (COREG) 12.5 MG tablet Take 1 tablet (12.5 mg total) by mouth 2 (two) times daily.  07/24/19 10/22/19  Bhagat, Crista Luria, PA  escitalopram (LEXAPRO) 10 MG tablet Take 10 mg by mouth daily.    [provider]  FARXIGA 5 MG TABS tablet Take 5 mg by mouth daily. 06/12/19   [provider]  fluticasone (FLONASE) 50 MCG/ACT nasal spray Place 1 spray into both nostrils daily as needed for allergies or rhinitis.    [provider]  meclizine (ANTIVERT) 25 MG tablet Take 1 tablet (25 mg total) by mouth 3 (three) times daily as needed for dizziness. 10/30/18   Lacretia Leigh, MD  Multiple Vitamin (MULTIVITAMIN WITH MINERALS) TABS tablet Take 1 tablet by mouth daily.    [provider]  nitroGLYCERIN (NITROSTAT) 0.4 MG SL tablet Place 1 tablet (0.4 mg total) under the tongue every 5 (five) minutes x 3 doses as needed for chest pain. 07/04/19   Furth, Cadence H, PA-C  ticagrelor (BRILINTA) 90 MG TABS tablet Take 1 tablet (90 mg total) by mouth 2 (two) times daily. 07/04/19   Furth, Cadence H, PA-C    Inpatient Medications: Scheduled Meds: . sodium chloride flush  3 mL Intravenous Once   Continuous Infusions:  PRN Meds:   Allergies:    Allergies  Allergen Reactions  . Dilaudid [Hydromorphone Hcl] Hives  . Erythromycin Nausea And Vomiting  . Penicillins Hives and Nausea And Vomiting    Has patient had a PCN reaction causing immediate rash, facial/tongue/throat swelling, SOB or lightheadedness with hypotension: No Has patient had a PCN reaction causing severe rash involving mucus membranes or skin necrosis: No Has patient had a PCN reaction that required hospitalization No Has patient had a PCN reaction occurring within the last 10 years: Yes If all of the above answers are "NO", then may proceed with Cephalosporin use.  Marland Kitchen Percocet [Oxycodone-Acetaminophen] Itching and Nausea And Vomiting    Can tolerate with premedication of diphenhydramine  . Zithromax [Azithromycin] Nausea And Vomiting    Social History:   Social History   Socioeconomic  History  . Marital status: Married    Spouse name: Not on file  . Number of children: Not on file  . Years of education: Not on file  . Highest education level: Not on file  Occupational History  . Not on file  Social Needs  . Financial resource strain: Not on file  . Food insecurity    Worry: Not on file    Inability: Not on file  . Transportation needs    Medical: Not on file    Non-medical: Not on file  Tobacco Use  . Smoking status: Never Smoker  . Smokeless tobacco: Never Used  Substance and Sexual Activity  . Alcohol use: Yes    Comment: Social drinker  . Drug use: No  . Sexual activity: Yes  Lifestyle  . Physical activity    Days per week: Not on file    Minutes per session: Not on file  . Stress: Not on file  Relationships  . Social Musicianconnections    Talks on phone: Not on file    Gets together: Not on file    Attends religious service: Not on file    Active member of club or organization: Not on file    Attends meetings of clubs or organizations: Not on file    Relationship status: Not on file  . Intimate partner violence    Fear of current or ex partner: Not on file    Emotionally abused: Not on file    Physically abused: Not on file    Forced sexual activity: Not on file  Other Topics Concern  . Not on file  Social History Narrative  . Not on file    Family History:    Family History  Problem Relation Age of Onset  . CAD Father      ROS:  Please see the history of present illness.  General:no colds or fevers, no weight changes Skin:no rashes or ulcers HEENT:no blurred vision, no congestion CV:see HPI PUL:see HPI GI:no diarrhea constipation or melena, no indigestion GU:no hematuria, no dysuria MS:no joint pain, no claudication Neuro:no syncope, no lightheadedness Endo:no diabetes, no thyroid disease  All other ROS reviewed and negative.     Physical Exam/Data:   Vitals:   07/26/19 0234 07/26/19 0428 07/26/19 0606  BP: 135/82 114/83 (!)  122/107  Pulse: 68 66 68  Resp: 18 18 18   Temp: 98.4 F (36.9 C) 98.4 F (36.9 C)   TempSrc: Oral Oral   SpO2: 95% 98% 98%   No intake or output data in the 24 hours ending 07/26/19 0740 Last 3 Weights 07/24/2019 07/11/2019 07/05/2019  Weight (lbs) 183 lb 187 lb 188 lb 12.8 oz  Weight (kg) 83.008 kg 84.823 kg 85.639 kg     There is no height or weight on file to calculate BMI.  General:  Well nourished, well developed, in no acute distress, but not feeling well with headache HEENT: normal Lymph: no adenopathy Neck: no JVD Endocrine:  No thryomegaly Vascular: No carotid bruits; pedal pulses 2+ bilaterally   Cardiac:  normal S1, S2; RRR; no murmur, gallup rub or click  Lungs:  clear to auscultation bilaterally, no wheezing, rhonchi or rales  Abd: soft, nontender, no hepatomegaly  Ext: no edema Musculoskeletal:  No deformities, BUE and BLE strength normal and equal Skin: warm and dry  Neuro:  Alert and oriented X 3 MAE follows commands, no focal abnormalities noted Psych:  Normal affect    Relevant CV Studies: Echocardiogram:07/03/2019 1. Left ventricular ejection fraction, by visual estimation, is 50 to 55%. The left ventricle has normal function. Left ventricular septal wall thickness was mildly increased. There is no left ventricular hypertrophy. 2. Left ventricular diastolic parameters are consistent with Grade I diastolic dysfunction (impaired relaxation). 3. Hypokinesis of the mid to apical anteroseptum, apical anterior, and apical myocardium. 4. Global right ventricle has normal systolic function.The right ventricular size is normal. No increase in right ventricular wall thickness. 5. Left atrial size was normal. 6. Right atrial size was normal. 7. Trivial pericardial effusion is present. 8. The mitral valve is normal in structure. No evidence of mitral valve regurgitation. No evidence of mitral stenosis. 9. The tricuspid valve is normal in structure. Tricuspid  valve regurgitation is trivial. 10. The aortic valve is tricuspid.  Aortic valve regurgitation is trivial. No evidence of aortic valve sclerosis or stenosis. 11. The pulmonic valve was normal in structure. Pulmonic valve regurgitation is trivial. 12. Normal pulmonary artery systolic pressure. 13. The inferior vena cava is normal in size with greater than 50% respiratory variability, suggesting right atrial pressure of 3 mmHg.  CORONARY STENT INTERVENTION11/06/2019  LEFT HEART CATH AND CORONARY ANGIOGRAPHY  Conclusion  1. Total occlusion of the mid-LAD, treated successfully with PCI using a 2.5x20 mm Synergy DES 2. Mild nonobstructive stenosis of the LCx and RCA 3. Mildly elevated LVEDP  Recommend: aggressive medical therapy, DAPT with ASA and ticagrelor x 12 months without interruption     Laboratory Data:  High Sensitivity Troponin:   Recent Labs  Lab 07/03/19 0349 07/03/19 0528 07/03/19 1404 07/26/19 0242 07/26/19 0519  TROPONINIHS 1,275* 2,741* 22,166* 20* 20*     Chemistry Recent Labs  Lab 07/22/19 1611 07/26/19 0242  NA 141 137  K 3.8 3.6  CL 104 105  CO2 25 22  GLUCOSE 115* 121*  BUN 20 20  CREATININE 1.79* 1.62*  CALCIUM 9.3 9.0  GFRNONAA 31* 35*  GFRAA 36* 41*  ANIONGAP  --  10    No results for input(s): PROT, ALBUMIN, AST, ALT, ALKPHOS, BILITOT in the last 168 hours. Hematology Recent Labs  Lab 07/26/19 0242  WBC 10.4  RBC 3.93  HGB 11.1*  HCT 33.3*  MCV 84.7  MCH 28.2  MCHC 33.3  RDW 14.0  PLT 337   BNPNo results for input(s): BNP, PROBNP in the last 168 hours.  DDimer No results for input(s): DDIMER in the last 168 hours.   Radiology/Studies:  Dg Chest 2 View  Result Date: 07/26/2019 CLINICAL DATA:  58 year old female with chest pressure, palpitations, shortness of breath. EXAM: CHEST - 2 VIEW COMPARISON:  07/03/2019 chest radiographs and earlier. FINDINGS: Stable lung volumes and mild cardiomegaly. Other mediastinal contours are  within normal limits. Visualized tracheal air column is within normal limits. Stable pulmonary vascularity with no pulmonary edema. No pneumothorax, pleural effusion or confluent opacity. Stable visualized osseous structures. Stable cholecystectomy clips. Negative visible bowel gas pattern. IMPRESSION: No acute cardiopulmonary abnormality. Mild cardiomegaly. Electronically Signed   By: Odessa Fleming M.D.   On: 07/26/2019 02:56    Assessment and Plan:   1. Unstable angina vs pericarditis troponin 20 hs X 2/ SOB as well check DDimer  If elevated check CTA of chest for PE. If neg plan to discharge.  Dr. [place] has seen.  EKG is improved from previous.    2. Palpitations but described as racing HR different than her hx PVCs,  recent increase of BB she will need zio patch will order.  3. Recent NSTEMI 07/03/19 with stent to LAD and mild disease otherwise.  4. HTN elevated at home but stable here after 2 SL NTG 5. HLD on statin high intensity 6. CKD-3 Cr improved but holding prior ARB and spironolactone.       For questions or updates, please contact [facility] Please consult [email or web address] for contact info under     Signed, Nada Boozer, NP  07/26/2019 7:40 AM

## 2019-07-26 NOTE — ED Notes (Signed)
NP in with pt will add d dimer and if negative will plan to send home to get a monitor on Monday.  HA remains a 7-8 and also c/o nausea.  NP also to order Zofran.

## 2019-07-26 NOTE — ED Notes (Signed)
Pt restful at this time but c/o HA from Nitro.  Tylenol has been given and will continue to monitor.  Denies any other sx or needs at this time.

## 2019-07-26 NOTE — ED Provider Notes (Signed)
[facility] EMERGENCY DEPARTMENT Provider Note   CSN: [id number] Arrival date & time: 07/26/19  0228     History   Chief Complaint Chief Complaint  Patient presents with   Chest Pain    HPI Andrea Robertson is a 58 y.o. female presenting for evaluation of shortness of breath and palpitations.  Patient states she woke up to go to the bathroom, essentially back prior she had acute onset palpitations, shortness of breath, and dull chest pressure.  Patient states the symptoms lasted for several minutes, which is the longest has had symptoms like this.  She was told that if she ever had symptoms like this she should call 911.  She was given nitro and aspirin with EMS, since then she has developed a headache.  Her chest symptoms have since resolved, and have not returned.  She denies recent fever, chills, sore throat, cough, nausea, vomiting, abdominal pain, urinary symptoms, abnormal bowel movements.  Additional history obtained from chart review.  Patient was admitted 07/03/2019 for NSTEMI in which she was found to have complete occlusion of the LAD with a drug-eluting stent placed.  She also had nonocclusive multivessel disease which will be treated medically.  Since then, she has had multiple changes in her medication, but states she is taking all her medications as prescribed.  She states she is taking her Brilinta as instructed.       HPI  Past Medical History:  Diagnosis Date   Arthritis    Coronary artery disease    Depression    Diabetes mellitus without complication (HCC)    GERD (gastroesophageal reflux disease)    Headache    sinus headaches   Hypertension    PVC's (premature ventricular contractions)     Patient Active Problem List   Diagnosis Date Noted   Palpitations    Shortness of breath    Hyperlipidemia 07/04/2019   Coronary artery disease involving native coronary artery of native heart without angina pectoris 07/04/2019    Chest pain 07/03/2019   NSTEMI (non-ST elevated myocardial infarction) (HCC) 07/03/2019   AKI (acute kidney injury) (HCC)    Vertigo 10/30/2018   Essential hypertension 10/30/2018   GERD (gastroesophageal reflux disease) 10/30/2018   PVC (premature ventricular contraction) 10/30/2018   History of total knee arthroplasty 10/13/2015    Past Surgical History:  Procedure Laterality Date   ABDOMINAL HYSTERECTOMY     CHOLECYSTECTOMY     CORONARY STENT INTERVENTION N/A 07/03/2019   Procedure: CORONARY STENT INTERVENTION;  Surgeon: Tonny Bollman, MD;  Location: [facility] INVASIVE CV LAB;  Service: Cardiovascular;  Laterality: N/A;   LEFT HEART CATH AND CORONARY ANGIOGRAPHY N/A 07/03/2019   Procedure: LEFT HEART CATH AND CORONARY ANGIOGRAPHY;  Surgeon: Tonny Bollman, MD;  Location: [facility] INVASIVE CV LAB;  Service: Cardiovascular;  Laterality: N/A;   TOTAL KNEE ARTHROPLASTY Right 10/13/2015   Procedure: RIGHT TOTAL KNEE ARTHROPLASTY;  Surgeon: Ranee Gosselin, MD;  Location: [facility];  Service: Orthopedics;  Laterality: Right;     OB History   No obstetric history on file.      Home Medications    Prior to Admission medications   Medication Sig Start Date End Date Taking? Authorizing Provider  acetaminophen (TYLENOL) 500 MG tablet Take 1,000 mg by mouth every 6 (six) hours as needed for mild pain or headache.    [provider]  aspirin EC 81 MG EC tablet Take 1 tablet (81 mg total) by mouth daily. 07/04/19   Furth, Cadence H,  PA-C  atorvastatin (LIPITOR) 80 MG tablet Take 1 tablet (80 mg total) by mouth daily at 6 PM. 07/04/19   Furth, Cadence H, PA-C  buPROPion (WELLBUTRIN XL) 300 MG 24 hr tablet Take 300 mg by mouth daily.    [provider]  carvedilol (COREG) 12.5 MG tablet Take 1 tablet (12.5 mg total) by mouth 2 (two) times daily. 07/24/19 10/22/19  Bhagat, Crista Luria, PA  escitalopram (LEXAPRO) 10 MG tablet Take 10 mg by mouth daily.    [provider]    FARXIGA 5 MG TABS tablet Take 5 mg by mouth daily. 06/12/19   [provider]  fluticasone (FLONASE) 50 MCG/ACT nasal spray Place 1 spray into both nostrils daily as needed for allergies or rhinitis.    [provider]  meclizine (ANTIVERT) 25 MG tablet Take 1 tablet (25 mg total) by mouth 3 (three) times daily as needed for dizziness. 10/30/18   Lacretia Leigh, MD  Multiple Vitamin (MULTIVITAMIN WITH MINERALS) TABS tablet Take 1 tablet by mouth daily.    [provider]  nitroGLYCERIN (NITROSTAT) 0.4 MG SL tablet Place 1 tablet (0.4 mg total) under the tongue every 5 (five) minutes x 3 doses as needed for chest pain. 07/04/19   Furth, Cadence H, PA-C  ticagrelor (BRILINTA) 90 MG TABS tablet Take 1 tablet (90 mg total) by mouth 2 (two) times daily. 07/04/19   Furth, Cadence H, PA-C    Family History Family History  Problem Relation Age of Onset   CAD Father     Social History Social History   Tobacco Use   Smoking status: Never Smoker   Smokeless tobacco: Never Used  Substance Use Topics   Alcohol use: Yes    Comment: Social drinker   Drug use: No     Allergies   Dilaudid [hydromorphone hcl], Erythromycin, Penicillins, Percocet [oxycodone-acetaminophen], and Zithromax [azithromycin]   Review of Systems Review of Systems  Respiratory: Positive for shortness of breath.   Cardiovascular: Positive for chest pain and palpitations.  All other systems reviewed and are negative.    Physical Exam Updated Vital Signs BP 130/90    Pulse 71    Temp 98.2 F (36.8 C) (Oral)    Resp (!) 23    SpO2 97%   Physical Exam Vitals signs and nursing note reviewed.  Constitutional:      General: She is not in acute distress.    Appearance: She is well-developed.     Comments: Obese female resting comfortably in the bed in no acute distress  HENT:     Head: Normocephalic and atraumatic.  Eyes:     Conjunctiva/sclera: Conjunctivae normal.     Pupils:  Pupils are equal, round, and reactive to light.  Neck:     Musculoskeletal: Normal range of motion and neck supple.  Cardiovascular:     Rate and Rhythm: Normal rate and regular rhythm.     Pulses: Normal pulses.  Pulmonary:     Effort: Pulmonary effort is normal. No respiratory distress.     Breath sounds: Normal breath sounds. No wheezing.     Comments: Clear lung sounds in all fields.  Speaking in full sentences. Abdominal:     General: There is no distension.     Palpations: Abdomen is soft. There is no mass.     Tenderness: There is no abdominal tenderness. There is no guarding or rebound.  Musculoskeletal: Normal range of motion.     Comments: No leg pain or swelling.  No pitting edema.  Skin:    General: Skin is warm and dry.     Capillary Refill: Capillary refill takes less than 2 seconds.  Neurological:     Mental Status: She is alert and oriented to person, place, and time.      ED Treatments / Results  Labs (all labs ordered are listed, but only abnormal results are displayed) Labs Reviewed  BASIC METABOLIC PANEL - Abnormal; Notable for the following components:      Result Value   Glucose, Bld 121 (*)    Creatinine, Ser 1.62 (*)    GFR calc non Af Amer 35 (*)    GFR calc Af Amer 41 (*)    All other components within normal limits  CBC - Abnormal; Notable for the following components:   Hemoglobin 11.1 (*)    HCT 33.3 (*)    All other components within normal limits  BRAIN NATRIURETIC PEPTIDE - Abnormal; Notable for the following components:   B Natriuretic Peptide 212.5 (*)    All other components within normal limits  D-DIMER, QUANTITATIVE (NOT AT [facility]) - Abnormal; Notable for the following components:   D-Dimer, Quant 1.12 (*)    All other components within normal limits  TROPONIN I (HIGH SENSITIVITY) - Abnormal; Notable for the following components:   Troponin I (High Sensitivity) 20 (*)    All other components within normal limits  TROPONIN I (HIGH  SENSITIVITY) - Abnormal; Notable for the following components:   Troponin I (High Sensitivity) 20 (*)    All other components within normal limits  PROTIME-INR    EKG EKG Interpretation  Date/Time:  Friday July 26 2019 02:34:13 EST Ventricular Rate:  72 PR Interval:  178 QRS Duration: 80 QT Interval:  466 QTC Calculation: 510 R Axis:   -5 Text Interpretation: Normal sinus rhythm Inferior infarct , age undetermined Anterolateral infarct , age undetermined Prolonged QT lateral ischemic changes Confirmed by Palumbo, April ([id number]) on 07/26/2019 7:09:04 AM   Radiology Dg Chest 2 View  Result Date: 07/26/2019 CLINICAL DATA:  58 year old female with chest pressure, palpitations, shortness of breath. EXAM: CHEST - 2 VIEW COMPARISON:  07/03/2019 chest radiographs and earlier. FINDINGS: Stable lung volumes and mild cardiomegaly. Other mediastinal contours are within normal limits. Visualized tracheal air column is within normal limits. Stable pulmonary vascularity with no pulmonary edema. No pneumothorax, pleural effusion or confluent opacity. Stable visualized osseous structures. Stable cholecystectomy clips. Negative visible bowel gas pattern. IMPRESSION: No acute cardiopulmonary abnormality. Mild cardiomegaly. Electronically Signed   By: Odessa FlemingH  Hall M.D.   On: 07/26/2019 02:56   Ct Angio Chest Pe W And/or Wo Contrast  Result Date: 07/26/2019 CLINICAL DATA:  Suspect PE in the setting of positive D-dimer., chest pain and shortness of breath. EXAM: CT ANGIOGRAPHY CHEST WITH CONTRAST TECHNIQUE: Multidetector CT imaging of the chest was performed using the standard protocol during bolus administration of intravenous contrast. Multiplanar CT image reconstructions and MIPs were obtained to evaluate the vascular anatomy. CONTRAST:  54 mL Omnipaque 350 COMPARISON:  Chest x-ray same date. FINDINGS: Cardiovascular: Heart size is enlarged. Aorta is not well opacified on today's exam, study optimized for  pulmonary arterial assessment. Ascending aortic caliber at 3.5 cm. Scattered small areas of calcification in the aorta. No sign of acute pulmonary embolism. Mediastinum/Nodes: Limited assessment of mediastinal structures without signs of adenopathy. Mild enlargement of subcarinal lymph node at 11 mm, with benign appearance retaining fatty hilum on coronal images. No signs of hilar adenopathy. Lungs/Pleura:  Signs of basilar atelectasis and subtle ground-glass at the lung bases. No signs of edema or pleural effusion. Airways are patent. Upper Abdomen: Post cholecystectomy. No acute upper abdominal process. Musculoskeletal: No chest wall abnormality. No acute or significant osseous findings. Review of the MIP images confirms the above findings. IMPRESSION: 1. No pulmonary emboli. 2. Cardiomegaly. 3. Mild ground-glass at the lung bases could represent atelectasis, air trapping or mild pneumonitis. Aortic Atherosclerosis (ICD10-I70.0). Electronically Signed   By: Donzetta Kohut M.D.   On: 07/26/2019 14:34    Procedures Procedures (including critical care time)  Medications Ordered in ED Medications  sodium chloride flush (NS) 0.9 % injection 3 mL (3 mLs Intravenous Not Given 07/26/19 0605)  acetaminophen (TYLENOL) tablet 1,000 mg (1,000 mg Oral Given 07/26/19 0652)  butalbital-acetaminophen-caffeine (FIORICET) 50-325-40 MG per tablet 1 tablet (1 tablet Oral Given 07/26/19 0858)  LORazepam (ATIVAN) injection 1 mg (1 mg Intravenous Given 07/26/19 0925)  iohexol (OMNIPAQUE) 350 MG/ML injection 75 mL (55 mLs Intravenous Contrast Given 07/26/19 1347)     Initial Impression / Assessment and Plan / ED Course  I have reviewed the triage vital signs and the nursing notes.  Pertinent labs & imaging results that were available during my care of the patient were reviewed by me and considered in my medical decision making (see chart for details).        Patient presenting for evaluation of chest pain,  palpitations, shortness of breath.  Physical exam shows patient who appears nontoxic.  However, due to her recent history of NSTEMI with complete occlusion of the LAD, I have a higher concern for cardiac cause of her symptoms.  I evaluated patient 4 hours after her arrival to the ED.  At that time she was symptom-free.  Her labs show baseline creatinine.  Her troponin was 20x2, this is improved from the 22,000 at her last admission.  Chest x-ray was read interpreted by me, shows mild cardiomegaly, no pneumothorax, effusion, or pneumonia.  Cardiomegaly appears to be new, as such we will add on BNP.  Last echo was last month, EF was 50 to 55%.  Patient is no longer on spironolactone or other diuretics due to increase in SCr.  EKG concerning for ischemic changes in the lateral leads. Additionally, pt with prolonged QT. Will consult with cardiology.  Discussed with cardiology who will evaluate the patient in the ED.  Cardiology recommends working up patient for PE.  If negative, she can be discharged home and set up for Holter monitor next week.  Dimer ordered per cardiology.  D-dimer mildly elevated at 1.12.  This is higher than previous.  On reassessment, patient reports her headache is resolved.  She is aware that we are waiting a CT to rule out PE.  CTA negative for PE.  Cardiology reevaluated the patient.  Plan for discharge with close follow-up outpatient.  At this time, patient appears safe for discharge.  Return precautions given.  Patient states she understands and agrees to plan.  Final Clinical Impressions(s) / ED Diagnoses   Final diagnoses:  Palpitations  Shortness of breath  Prolonged Q-T interval on ECG    ED Discharge Orders    None       Alveria Apley, PA-C 07/26/19 1522    Virgina Norfolk, DO 07/26/19 1700

## 2019-07-26 NOTE — ED Triage Notes (Signed)
Patient arrived with EMS from home reports central chest pressure and palpitations onset this morning with mild SOB , no emesis or diaphoresis . She received ASA 324 mg and 2 NTG sl prior to arrival , her cardiologist is Dr. Meda Coffee , history of CAD / Coronary stent .

## 2019-07-26 NOTE — ED Notes (Signed)
IV restarted and CT called.  Pt disconnected from monitor and up ambulatory in the room to stretch.  Pt and husband updated on POC.

## 2019-07-29 ENCOUNTER — Telehealth: Payer: Self-pay | Admitting: *Deleted

## 2019-07-29 NOTE — Telephone Encounter (Signed)
Please call [phone] regarding ordered monitor.

## 2019-07-31 ENCOUNTER — Ambulatory Visit (INDEPENDENT_AMBULATORY_CARE_PROVIDER_SITE_OTHER): Payer: BC Managed Care – PPO

## 2019-07-31 DIAGNOSIS — R000 Tachycardia, unspecified: Secondary | ICD-10-CM | POA: Diagnosis not present

## 2019-07-31 DIAGNOSIS — I493 Ventricular premature depolarization: Secondary | ICD-10-CM

## 2019-08-01 ENCOUNTER — Other Ambulatory Visit: Payer: Self-pay

## 2019-08-01 ENCOUNTER — Encounter: Payer: Self-pay | Admitting: Physician Assistant

## 2019-08-01 ENCOUNTER — Telehealth (INDEPENDENT_AMBULATORY_CARE_PROVIDER_SITE_OTHER): Payer: BC Managed Care – PPO | Admitting: Physician Assistant

## 2019-08-01 VITALS — BP 156/85 | HR 74 | Ht 63.0 in | Wt 183.0 lb

## 2019-08-01 DIAGNOSIS — R002 Palpitations: Secondary | ICD-10-CM

## 2019-08-01 DIAGNOSIS — N179 Acute kidney failure, unspecified: Secondary | ICD-10-CM | POA: Diagnosis not present

## 2019-08-01 DIAGNOSIS — I2510 Atherosclerotic heart disease of native coronary artery without angina pectoris: Secondary | ICD-10-CM

## 2019-08-01 DIAGNOSIS — I10 Essential (primary) hypertension: Secondary | ICD-10-CM | POA: Diagnosis not present

## 2019-08-01 NOTE — Patient Instructions (Signed)
Medication Instructions:  Your physician recommends that you continue on your current medications as directed. Please refer to the Current Medication list given to you today.  *If you need a refill on your cardiac medications before your next appointment, please call your pharmacy*  Lab Work: None ordered   If you have labs (blood work) drawn today and your tests are completely normal, you will receive your results only by: Marland Kitchen MyChart Message (if you have MyChart) OR . A paper copy in the mail If you have any lab test that is abnormal or we need to change your treatment, we will call you to review the results.  Testing/Procedures: None ordered   Follow-Up: You are scheduled for a virtual visit with Robbie Lis, PA-C on 09/11/2019 @ 10:00 AM  Other Instructions None

## 2019-08-01 NOTE — Progress Notes (Signed)
Virtual Visit via Video Note   This visit type was conducted due to national recommendations for restrictions regarding the COVID-19 Pandemic (e.g. social distancing) in an effort to limit this patient's exposure and mitigate transmission in our community.  Due to her co-morbid illnesses, this patient is at least at moderate risk for complications without adequate follow up.  This format is felt to be most appropriate for this patient at this time.  All issues noted in this document were discussed and addressed.  A limited physical exam was performed with this format.  Please refer to the patient's chart for her consent to telehealth for Andrea Robertson - [facility].   Date:  08/01/2019   ID:  Andrea Robertson, DOB 1961-02-25, MRN [id number]  Patient Location: Home Provider Location: Home  PCP:  Lorenda Ishihara, MD  Cardiologist:  Tobias Alexander, MD  Evaluation Performed:  Follow-Up Visit  Chief Complaint:  ED visit follow up  History of Present Illness:    Andrea Robertson is a 58 y.o. female with hx of CAD s/p DES to mLAD, post infract pericarditis, HTN, HLD, diabetes mellitusand HLD seen for follow up.  Admitted 06/2019 with NSTEMI.EKG wth dynamic changes, minimal ST elevation inferior leads. HS troponin peakedat 22,166.Cath showed total occlusion of mid LAD, mild nonobstructive stenosis of LCx and RCA, treated with PCI to mid LAD. Echoshowed EF 50-55%, G1DD, trivial pericardial effusion.had AKI and valsartan held. Sed rateandCRPelevated>>but no treatment. Seen In clinic by Dr. Tenny Craw next day after discharge for CP which felt non ischemic. Could be pericarditis. Started on colchicineand Tylenol with resolution of symptoms. Due to AKI with SCr of 1.7 stopped Irbesartan and spironolactone. No recurrent chest pain after discontinuation of colchicine.  Seen in ER 12/4 for palpitation. CTA negative for PE. Ruled out and placed on monitor.   Seen for follow up. Wearing monitor  for past few day. She worse episode of palpitations that lead to ER eval. No recurrent episode. No CP, SOB, dizziness, orthopnea, PND or syncope. BP is high this morning but did not took her medications.   The patient does not have symptoms concerning for COVID-19 infection (fever, chills, cough, or new shortness of breath).    Past Medical History:  Diagnosis Date  . Arthritis   . Coronary artery disease   . Depression   . Diabetes mellitus without complication (HCC)   . GERD (gastroesophageal reflux disease)   . Headache    sinus headaches  . Hypertension   . PVC's (premature ventricular contractions)    Past Surgical History:  Procedure Laterality Date  . ABDOMINAL HYSTERECTOMY    . CHOLECYSTECTOMY    . CORONARY STENT INTERVENTION N/A 07/03/2019   Procedure: CORONARY STENT INTERVENTION;  Surgeon: Tonny Bollman, MD;  Location: [facility] INVASIVE CV LAB;  Service: Cardiovascular;  Laterality: N/A;  . LEFT HEART CATH AND CORONARY ANGIOGRAPHY N/A 07/03/2019   Procedure: LEFT HEART CATH AND CORONARY ANGIOGRAPHY;  Surgeon: Tonny Bollman, MD;  Location: [facility] INVASIVE CV LAB;  Service: Cardiovascular;  Laterality: N/A;  . TOTAL KNEE ARTHROPLASTY Right 10/13/2015   Procedure: RIGHT TOTAL KNEE ARTHROPLASTY;  Surgeon: Ranee Gosselin, MD;  Location: [facility];  Service: Orthopedics;  Laterality: Right;     Current Meds  Medication Sig  . acetaminophen (TYLENOL) 500 MG tablet Take 1,000 mg by mouth every 6 (six) hours as needed for mild pain or headache.  Marland Kitchen aspirin EC 81 MG EC tablet Take 1 tablet (81 mg total) by mouth daily.  Marland Kitchen atorvastatin (LIPITOR)  80 MG tablet Take 1 tablet (80 mg total) by mouth daily at 6 PM.  . buPROPion (WELLBUTRIN XL) 300 MG 24 hr tablet Take 300 mg by mouth daily.  . carvedilol (COREG) 12.5 MG tablet Take 1 tablet (12.5 mg total) by mouth 2 (two) times daily.  Marland Kitchen escitalopram (LEXAPRO) 10 MG tablet Take 10 mg by mouth daily.  Marland Kitchen FARXIGA 5 MG TABS tablet Take 5 mg by mouth  daily.  . fluticasone (FLONASE) 50 MCG/ACT nasal spray Place 1 spray into both nostrils daily as needed for allergies or rhinitis.  Marland Kitchen meclizine (ANTIVERT) 25 MG tablet Take 1 tablet (25 mg total) by mouth 3 (three) times daily as needed for dizziness.  . Multiple Vitamin (MULTIVITAMIN WITH MINERALS) TABS tablet Take 1 tablet by mouth daily.  . nitroGLYCERIN (NITROSTAT) 0.4 MG SL tablet Place 1 tablet (0.4 mg total) under the tongue every 5 (five) minutes x 3 doses as needed for chest pain.  . ticagrelor (BRILINTA) 90 MG TABS tablet Take 1 tablet (90 mg total) by mouth 2 (two) times daily.     Allergies:   Dilaudid [hydromorphone hcl], Erythromycin, Penicillins, Percocet [oxycodone-acetaminophen], and Zithromax [azithromycin]   Social History   Tobacco Use  . Smoking status: Never Smoker  . Smokeless tobacco: Never Used  Substance Use Topics  . Alcohol use: Yes    Comment: Social drinker  . Drug use: No     Family Hx: The patient's family history includes CAD in her father.  ROS:   Please see the history of present illness.    All other systems reviewed and are negative.   Prior CV studies:   The following studies were reviewed today: As summarized above  Labs/Other Tests and Data Reviewed:    EKG:  No ECG reviewed.  Recent Labs: 07/25/2019: TSH 1.360 07/26/2019: B Natriuretic Peptide 212.5; BUN 20; Creatinine, Ser 1.62; Hemoglobin 11.1; Platelets 337; Potassium 3.6; Sodium 137   Recent Lipid Panel Lab Results  Component Value Date/Time   CHOL 197 07/04/2019 09:12 AM   TRIG 183 (H) 07/04/2019 09:12 AM   HDL 47 07/04/2019 09:12 AM   CHOLHDL 4.2 07/04/2019 09:12 AM   LDLCALC 113 (H) 07/04/2019 09:12 AM    Wt Readings from Last 3 Encounters:  08/01/19 183 lb (83 kg)  07/24/19 183 lb (83 kg)  07/11/19 187 lb (84.8 kg)     Objective:    Vital Signs:  BP (!) 156/85   Pulse 74   Ht 5\' 3"  (1.6 m)   Wt 183 lb (83 kg)   BMI 32.42 kg/m    VITAL SIGNS:  reviewed  GEN:  no acute distress EYES:  sclerae anicteric, EOMI - Extraocular Movements Intact RESPIRATORY:  normal respiratory effort, symmetric expansion CARDIOVASCULAR:  no peripheral edema SKIN:  no rash, lesions or ulcers. MUSCULOSKELETAL:  no obvious deformities. NEURO:  alert and oriented x 3, no obvious focal deficit PSYCH:  normal affect  ASSESSMENT & PLAN:    1. CAD - No recurrent angina. Continue current medical therapy.  2. Palpitation - now on monitor. Continue coreg at current dose.  3. HTN - Elevated this morning but did not took her meds. Advised to keep log and she will send [place] back to review next week.   4. AKI - Scr improving  COVID-19 Education: The signs and symptoms of COVID-19 were discussed with the patient and how to seek care for testing (follow up with PCP or arrange E-visit).  The importance of  social distancing was discussed today.  Time:   Today, I have spent 15 minutes with the patient with telehealth technology discussing the above problems.     Medication Adjustments/Labs and Tests Ordered: Current medicines are reviewed at length with the patient today.  Concerns regarding medicines are outlined above.   Tests Ordered: No orders of the defined types were placed in this encounter.   Medication Changes: No orders of the defined types were placed in this encounter.   Follow Up:  Either In Person or Virtual in 8 week(s)  Signed, Leanor Kail, PA  08/01/2019 8:42 AM    [place]

## 2019-08-05 DIAGNOSIS — I214 Non-ST elevation (NSTEMI) myocardial infarction: Secondary | ICD-10-CM | POA: Diagnosis not present

## 2019-08-05 DIAGNOSIS — S43421A Sprain of right rotator cuff capsule, initial encounter: Secondary | ICD-10-CM | POA: Diagnosis not present

## 2019-08-07 ENCOUNTER — Telehealth (HOSPITAL_COMMUNITY): Payer: Self-pay | Admitting: *Deleted

## 2019-08-07 ENCOUNTER — Telehealth: Payer: Self-pay | Admitting: Cardiology

## 2019-08-07 NOTE — Telephone Encounter (Signed)
New Message    Pt is scheduled for Dental Work in February. She is on the waiting list to try to get it done sooner. She is having 2 crowns replaced and a bridge   She is needing something from our office to give to the dentist stating its okay for her to have the procedure    Please call

## 2019-08-07 NOTE — Telephone Encounter (Signed)
Left message for the pt stating that we will need the dental office fax over a clearance request, fax # [phone].

## 2019-08-08 ENCOUNTER — Ambulatory Visit (HOSPITAL_COMMUNITY): Payer: BC Managed Care – PPO

## 2019-08-08 ENCOUNTER — Encounter (HOSPITAL_COMMUNITY): Payer: Self-pay

## 2019-08-08 ENCOUNTER — Emergency Department (HOSPITAL_COMMUNITY): Payer: BC Managed Care – PPO

## 2019-08-08 ENCOUNTER — Other Ambulatory Visit: Payer: Self-pay

## 2019-08-08 ENCOUNTER — Emergency Department (HOSPITAL_COMMUNITY)
Admission: EM | Admit: 2019-08-08 | Discharge: 2019-08-08 | Disposition: A | Discharge status: Home / Self Care | Payer: BC Managed Care – PPO | Attending: Emergency Medicine | Admitting: Emergency Medicine

## 2019-08-08 ENCOUNTER — Telehealth: Payer: Self-pay | Admitting: Cardiology

## 2019-08-08 DIAGNOSIS — I2510 Atherosclerotic heart disease of native coronary artery without angina pectoris: Secondary | ICD-10-CM | POA: Diagnosis not present

## 2019-08-08 DIAGNOSIS — R0602 Shortness of breath: Secondary | ICD-10-CM | POA: Diagnosis not present

## 2019-08-08 DIAGNOSIS — I10 Essential (primary) hypertension: Secondary | ICD-10-CM | POA: Diagnosis not present

## 2019-08-08 DIAGNOSIS — Z7982 Long term (current) use of aspirin: Secondary | ICD-10-CM | POA: Diagnosis not present

## 2019-08-08 DIAGNOSIS — Z7984 Long term (current) use of oral hypoglycemic drugs: Secondary | ICD-10-CM | POA: Diagnosis not present

## 2019-08-08 DIAGNOSIS — Z79899 Other long term (current) drug therapy: Secondary | ICD-10-CM | POA: Insufficient documentation

## 2019-08-08 DIAGNOSIS — R079 Chest pain, unspecified: Secondary | ICD-10-CM | POA: Diagnosis not present

## 2019-08-08 DIAGNOSIS — E119 Type 2 diabetes mellitus without complications: Secondary | ICD-10-CM | POA: Diagnosis not present

## 2019-08-08 LAB — TROPONIN I (HIGH SENSITIVITY)
Troponin I (High Sensitivity): 18 ng/L — ABNORMAL HIGH (ref <18)
Troponin I (High Sensitivity): 18 ng/L — ABNORMAL HIGH (ref <18)

## 2019-08-08 LAB — CBC
HCT: 36.9 % (ref 36.0–46.0)
Hemoglobin: 11.9 g/dL — ABNORMAL LOW (ref 12.0–15.0)
MCH: 27.7 pg (ref 26.0–34.0)
MCHC: 32.2 g/dL (ref 30.0–36.0)
MCV: 86 fL (ref 80.0–100.0)
Platelets: 358 10*3/uL (ref 150–400)
RBC: 4.29 MIL/uL (ref 3.87–5.11)
RDW: 14.5 % (ref 11.5–15.5)
WBC: 11.4 10*3/uL — ABNORMAL HIGH (ref 4.0–10.5)
nRBC: 0 % (ref 0.0–0.2)

## 2019-08-08 LAB — I-STAT BETA HCG BLOOD, ED (MC, WL, AP ONLY): I-stat hCG, quantitative: <5 m[IU]/mL (ref <5)

## 2019-08-08 LAB — BASIC METABOLIC PANEL
Anion gap: 11 (ref 5–15)
BUN: 22 mg/dL — ABNORMAL HIGH (ref 6–20)
CO2: 22 mmol/L (ref 22–32)
Calcium: 9 mg/dL (ref 8.9–10.3)
Chloride: 104 mmol/L (ref 98–111)
Creatinine, Ser: 1.59 mg/dL — ABNORMAL HIGH (ref 0.44–1.00)
GFR calc Af Amer: 42 mL/min — ABNORMAL LOW (ref >60)
GFR calc non Af Amer: 36 mL/min — ABNORMAL LOW (ref >60)
Glucose, Bld: 155 mg/dL — ABNORMAL HIGH (ref 70–99)
Potassium: 3.8 mmol/L (ref 3.5–5.1)
Sodium: 137 mmol/L (ref 135–145)

## 2019-08-08 LAB — BRAIN NATRIURETIC PEPTIDE: B Natriuretic Peptide: 237.7 pg/mL — ABNORMAL HIGH (ref 0.0–100.0)

## 2019-08-08 MED ORDER — FUROSEMIDE 10 MG/ML IJ SOLN
40.0000 mg | Freq: Once | INTRAMUSCULAR | Status: AC
  Administered 2019-08-08: 40 mg via INTRAVENOUS
  Filled 2019-08-08: qty 4

## 2019-08-08 MED ORDER — SODIUM CHLORIDE 0.9% FLUSH: 3.0000 mL | Freq: Once | INTRAVENOUS | Status: DC

## 2019-08-08 MED ORDER — FUROSEMIDE 20 MG PO TABS: 20.0000 mg | ORAL_TABLET | Freq: Every day | ORAL | 0 refills | Status: DC

## 2019-08-08 MED ORDER — ACETAMINOPHEN 500 MG PO TABS
1000.0000 mg | ORAL_TABLET | Freq: Once | ORAL | Status: AC
  Administered 2019-08-08: 08:00:00 1000 mg via ORAL
  Filled 2019-08-08: qty 2

## 2019-08-08 NOTE — Telephone Encounter (Signed)
New Message  1. What dental office are you calling from? [facility]   2. What is your office phone number? [phone]  3. What is your fax number? [phone]  4. What type of procedure is the patient having performed? Bridge or Implant Bridge and 2 extractions  5. What date is procedure scheduled or is the patient there now? TBD (if the patient is at the dentist's office question goes to their cardiologist if he/she is in the office.  If not, question should go to the DOD).   6. What is your question (ex. Antibiotics prior to procedure, holding medication-we need to know how long dentist wants pt to hold med)? Cardiac Clearance, holding meds, antibiotics before

## 2019-08-08 NOTE — ED Notes (Signed)
Patient verbalizes understanding of discharge instructions. Opportunity for questioning and answers were provided. Armband removed by staff, pt discharged from ED.  

## 2019-08-08 NOTE — ED Triage Notes (Signed)
Patient states she is having chest pain and shortness of breath that started around 4:00 pm yesterday. She reports an improvement in pain until about 10 pm when she started to have shortness of breath with ambulation. Chest pain and shortness of breath kept her awake. Patient reports a history of a heart attack and has Nitroglycerin prescribed to her. No relief with Nitro.

## 2019-08-08 NOTE — Discharge Instructions (Addendum)
Follow up with cardiology

## 2019-08-08 NOTE — Telephone Encounter (Signed)
I left a message for the dental office that pt will need an appt with our office before being cleared. Pt is set to see Robbie Lis, [facility] 09/11/19. I will fax the clearance notes to the dental office. I will forward notes to PA for upcoming appt.. I will remove from the pre op call back pool.

## 2019-08-08 NOTE — Telephone Encounter (Signed)
   Primary Cardiologist:Andrea Meda Coffee, MD  Chart reviewed as part of pre-operative protocol coverage. Because of Andrea Robertson's past medical history and time since last visit, he/she will require a follow-up visit in order to better assess preoperative cardiovascular risk.  Pt is currently in the ED with SOB and was recently evaluated in consultation with cardiology for palpitations. She will need to be seen in follow up, already scheduled for 09/12/19 prior to cardiac clearance for procedure.   Pre-op covering staff: - Please contact requesting surgeon's office via preferred method (i.e, phone, fax) to inform them of need for appointment prior to surgery.  If applicable, this message will also be routed to pharmacy pool and/or primary cardiologist for input on holding anticoagulant/antiplatelet agent as requested below so that this information is available at time of patient's appointment.   Kathyrn Drown, NP  08/08/2019, 10:04 AM

## 2019-08-08 NOTE — ED Provider Notes (Signed)
[facility] EMERGENCY DEPARTMENT Provider Note   CSN: [id number] Arrival date & time: 08/08/19  0522     History Chief Complaint  Patient presents with   Chest Pain   Shortness of Breath    Andrea Robertson is a 58 y.o. female.  The history is provided by the patient.  Chest Pain Pain location:  Substernal area Pain quality: aching   Pain radiates to:  Does not radiate Pain severity:  Mild Onset quality:  Gradual Timing:  Intermittent Progression:  Waxing and waning Chronicity:  Recurrent Context: at rest   Relieved by:  Nitroglycerin Associated symptoms: shortness of breath   Associated symptoms: no abdominal pain, no back pain, no cough, no fever, no palpitations and no vomiting   Risk factors: coronary artery disease and hypertension        Past Medical History:  Diagnosis Date   Arthritis    Coronary artery disease    Depression    Diabetes mellitus without complication (HCC)    GERD (gastroesophageal reflux disease)    Headache    sinus headaches   Hypertension    PVC's (premature ventricular contractions)     Patient Active Problem List   Diagnosis Date Noted   Palpitations    Shortness of breath    Hyperlipidemia 07/04/2019   Coronary artery disease involving native coronary artery of native heart without angina pectoris 07/04/2019   Chest pain 07/03/2019   NSTEMI (non-ST elevated myocardial infarction) (HCC) 07/03/2019   AKI (acute kidney injury) (HCC)    Vertigo 10/30/2018   Essential hypertension 10/30/2018   GERD (gastroesophageal reflux disease) 10/30/2018   PVC (premature ventricular contraction) 10/30/2018   History of total knee arthroplasty 10/13/2015    Past Surgical History:  Procedure Laterality Date   ABDOMINAL HYSTERECTOMY     CHOLECYSTECTOMY     CORONARY STENT INTERVENTION N/A 07/03/2019   Procedure: CORONARY STENT INTERVENTION;  Surgeon: Tonny Bollman, MD;  Location: [facility]  INVASIVE CV LAB;  Service: Cardiovascular;  Laterality: N/A;   LEFT HEART CATH AND CORONARY ANGIOGRAPHY N/A 07/03/2019   Procedure: LEFT HEART CATH AND CORONARY ANGIOGRAPHY;  Surgeon: Tonny Bollman, MD;  Location: [facility] INVASIVE CV LAB;  Service: Cardiovascular;  Laterality: N/A;   TOTAL KNEE ARTHROPLASTY Right 10/13/2015   Procedure: RIGHT TOTAL KNEE ARTHROPLASTY;  Surgeon: Ranee Gosselin, MD;  Location: [facility] ORS;  Service: Orthopedics;  Laterality: Right;     OB History   No obstetric history on file.     Family History  Problem Relation Age of Onset   CAD Father     Social History   Tobacco Use   Smoking status: Never Smoker   Smokeless tobacco: Never Used  Substance Use Topics   Alcohol use: Yes    Comment: Social drinker   Drug use: No    Home Medications Prior to Admission medications   Medication Sig Start Date End Date Taking? Authorizing Provider  acetaminophen (TYLENOL) 500 MG tablet Take 1,000 mg by mouth every 6 (six) hours as needed for mild pain or headache.    [provider]  aspirin EC 81 MG EC tablet Take 1 tablet (81 mg total) by mouth daily. 07/04/19   Furth, Cadence H, PA-C  atorvastatin (LIPITOR) 80 MG tablet Take 1 tablet (80 mg total) by mouth daily at 6 PM. 07/04/19   Furth, Cadence H, PA-C  buPROPion (WELLBUTRIN XL) 300 MG 24 hr tablet Take 300 mg by mouth daily.    [provider]  carvedilol (COREG) 12.5 MG tablet Take 1 tablet (12.5 mg total) by mouth 2 (two) times daily. 07/24/19 10/22/19  Bhagat, Crista Luria, PA  escitalopram (LEXAPRO) 10 MG tablet Take 10 mg by mouth daily.    [provider]  FARXIGA 5 MG TABS tablet Take 5 mg by mouth daily. 06/12/19   [provider]  fluticasone (FLONASE) 50 MCG/ACT nasal spray Place 1 spray into both nostrils daily as needed for allergies or rhinitis.    [provider]  furosemide (LASIX) 20 MG tablet Take 1 tablet (20 mg total) by mouth daily. 08/08/19 09/07/19   Mady Oubre, DO  meclizine (ANTIVERT) 25 MG tablet Take 1 tablet (25 mg total) by mouth 3 (three) times daily as needed for dizziness. 10/30/18   Lacretia Leigh, MD  Multiple Vitamin (MULTIVITAMIN WITH MINERALS) TABS tablet Take 1 tablet by mouth daily.    [provider]  nitroGLYCERIN (NITROSTAT) 0.4 MG SL tablet Place 1 tablet (0.4 mg total) under the tongue every 5 (five) minutes x 3 doses as needed for chest pain. 07/04/19   Furth, Cadence H, PA-C  ticagrelor (BRILINTA) 90 MG TABS tablet Take 1 tablet (90 mg total) by mouth 2 (two) times daily. 07/04/19   Furth, Cadence H, PA-C    Allergies    Dilaudid [hydromorphone hcl], Erythromycin, Penicillins, Percocet [oxycodone-acetaminophen], and Zithromax [azithromycin]  Review of Systems   Review of Systems  Constitutional: Negative for chills and fever.  HENT: Negative for ear pain and sore throat.   Eyes: Negative for pain and visual disturbance.  Respiratory: Positive for shortness of breath. Negative for cough.   Cardiovascular: Positive for chest pain. Negative for palpitations.  Gastrointestinal: Negative for abdominal pain and vomiting.  Genitourinary: Negative for dysuria and hematuria.  Musculoskeletal: Negative for arthralgias and back pain.  Skin: Negative for color change and rash.  Neurological: Negative for seizures and syncope.  All other systems reviewed and are negative.   Physical Exam Updated Vital Signs  ED Triage Vitals  Enc Vitals Group     BP 08/08/19 0641 (!) 141/94     Pulse Rate 08/08/19 0641 73     Resp 08/08/19 0641 (!) 25     Temp 08/08/19 0641 98 F (36.7 C)     Temp Source 08/08/19 0641 Oral     SpO2 08/08/19 0641 94 %     Weight 08/08/19 0534 181 lb (82.1 kg)     Height 08/08/19 0534 5\' 3"  (1.6 m)     Head Circumference --      Peak Flow --      Pain Score 08/08/19 0533 3     Pain Loc --      Pain Edu? --      Excl. in [place]? --     Physical Exam Vitals and nursing note reviewed.    Constitutional:      General: She is not in acute distress.    Appearance: She is well-developed.  HENT:     Head: Normocephalic and atraumatic.  Eyes:     Extraocular Movements: Extraocular movements intact.     Conjunctiva/sclera: Conjunctivae normal.     Pupils: Pupils are equal, round, and reactive to light.  Cardiovascular:     Rate and Rhythm: Normal rate and regular rhythm.     Pulses:          Radial pulses are 2+ on the right side and 2+ on the left side.     Heart sounds: Normal heart  sounds. No murmur.  Pulmonary:     Effort: Pulmonary effort is normal. No respiratory distress.     Comments: Diminished, possibly rales Abdominal:     Palpations: Abdomen is soft.     Tenderness: There is no abdominal tenderness.  Musculoskeletal:     Cervical back: Normal range of motion and neck supple.     Right lower leg: Edema (trace) present.     Left lower leg: Edema (trace) present.  Skin:    General: Skin is warm and dry.     Capillary Refill: Capillary refill takes less than 2 seconds.  Neurological:     General: No focal deficit present.     Mental Status: She is alert.  Psychiatric:        Mood and Affect: Mood normal.     ED Results / Procedures / Treatments   Labs (all labs ordered are listed, but only abnormal results are displayed) Labs Reviewed  BASIC METABOLIC PANEL - Abnormal; Notable for the following components:      Result Value   Glucose, Bld 155 (*)    BUN 22 (*)    Creatinine, Ser 1.59 (*)    GFR calc non Af Amer 36 (*)    GFR calc Af Amer 42 (*)    All other components within normal limits  CBC - Abnormal; Notable for the following components:   WBC 11.4 (*)    Hemoglobin 11.9 (*)    All other components within normal limits  BRAIN NATRIURETIC PEPTIDE - Abnormal; Notable for the following components:   B Natriuretic Peptide 237.7 (*)    All other components within normal limits  TROPONIN I (HIGH SENSITIVITY) - Abnormal; Notable for the  following components:   Troponin I (High Sensitivity) 18 (*)    All other components within normal limits  TROPONIN I (HIGH SENSITIVITY) - Abnormal; Notable for the following components:   Troponin I (High Sensitivity) 18 (*)    All other components within normal limits  I-STAT BETA HCG BLOOD, ED ([facility], [facility], [facility] ONLY)    EKG EKG Interpretation  Date/Time:  Thursday August 08 2019 05:29:29 EST Ventricular Rate:  77 PR Interval:  172 QRS Duration: 76 QT Interval:  418 QTC Calculation: 473 R Axis:   20 Text Interpretation: Normal sinus rhythm Inferior infarct , age undetermined Anterolateral infarct , age undetermined Abnormal ECG Confirmed by Virgina NorfolkAdam, Laporche Martelle [phone]([id number]) on 08/08/2019 7:06:26 AM   Radiology DG Chest 2 View  Result Date: 08/08/2019 CLINICAL DATA:  Chest pain EXAM: CHEST - 2 VIEW COMPARISON:  07/26/2019 FINDINGS: Cardiomegaly. New, symmetric diffuse interstitial coarsening which includes Kerley lines and fissure thickening. No visible effusion or pneumothorax. IMPRESSION: CHF pattern. Electronically Signed   By: Marnee SpringJonathon  Watts M.D.   On: 08/08/2019 06:11    Procedures Procedures (including critical care time)  Medications Ordered in ED Medications  sodium chloride flush (NS) 0.9 % injection 3 mL (has no administration in time range)  furosemide (LASIX) injection 40 mg (has no administration in time range)  acetaminophen (TYLENOL) tablet 1,000 mg (1,000 mg Oral Given 08/08/19 0820)    ED Course  I have reviewed the triage vital signs and the nursing notes.  Pertinent labs & imaging results that were available during my care of the patient were reviewed by me and considered in my medical decision making (see chart for details).    MDM Rules/Calculators/A&P  Andrea Robertson is a 58 year old female with history of CAD with recent stent placed last month who presents to the ED with chest pain, shortness of breath.  Patient has had mostly  shortness of breath especially when she lies flat and sometimes with exertion.  Has some chest pressure today that has now resolved.  Patient also has had some musculoskeletal type problems as well.  Currently she does not have any chest pain.  She has had recent ED evaluation for blood clots that was normal.  Also had normal heart work-up at that time as well.  She does have some trace edema in her legs.  Possibly some rales on her lung exam.  Chest x-ray shows some mild heart failure-looking pattern but no pneumonia, no pneumothorax.  BNP mildly elevated.  Troponin unremarkable x2.  EKG shows sinus rhythm.  No significant ischemic findings.  EKG is overall unchanged from prior.  Does not have any significant leukocytosis, anemia, electrolyte abnormality.  Creatinine around baseline at 1.5.  Talked with cardiology on the phone, Dr. Anne Fu, overall patient appears to possibly have some mild and new congestive heart failure likely in the setting of recent heart attack.  Overall she appears comfortable.  Overall minimally symptomatic.  Normal room air oxygenation.  We will give her dose of IV Lasix here and start patient on daily Lasix.  She does have follow-up already in place 3 days from now in which they can discuss echocardiogram and further management.  Given return precautions and discharged from ED in good condition.  This chart was dictated using voice recognition software.  Despite best efforts to proofread,  errors can occur which can change the documentation meaning.      Final Clinical Impression(s) / ED Diagnoses Final diagnoses:  SOB (shortness of breath)    Rx / DC Orders ED Discharge Orders         Ordered    furosemide (LASIX) 20 MG tablet  Daily     08/08/19 0949           Virgina Norfolk, DO 08/08/19 [phone]

## 2019-08-09 ENCOUNTER — Telehealth (HOSPITAL_COMMUNITY): Payer: Self-pay | Admitting: *Deleted

## 2019-08-09 NOTE — Telephone Encounter (Signed)
Spoke with the patient regarding in phase 2 cardiac rehab. Patient missed yesterday's orientation due to ED visit yesterday. Will reschedule for virtual /in person phase 2 once cleared by cardiology.Barnet Pall, RN,BSN 08/09/2019 2:24 PM

## 2019-08-12 ENCOUNTER — Telehealth: Payer: Self-pay | Admitting: Cardiology

## 2019-08-12 ENCOUNTER — Ambulatory Visit (HOSPITAL_COMMUNITY): Payer: BC Managed Care – PPO

## 2019-08-12 MED ORDER — CARVEDILOL 25 MG PO TABS: 25.0000 mg | ORAL_TABLET | Freq: Two times a day (BID) | ORAL | 3 refills | Status: DC

## 2019-08-12 NOTE — Telephone Encounter (Signed)
° ° °  Patient requesting response to MyChart messages regarding BP and monitor.

## 2019-08-12 NOTE — Telephone Encounter (Signed)
Returned pt's call and left a message for her to call back. Will also send message via mychart.

## 2019-08-14 ENCOUNTER — Ambulatory Visit (HOSPITAL_COMMUNITY): Payer: BC Managed Care – PPO

## 2019-08-19 ENCOUNTER — Encounter: Payer: Self-pay | Admitting: *Deleted

## 2019-08-19 ENCOUNTER — Ambulatory Visit (HOSPITAL_COMMUNITY): Payer: BC Managed Care – PPO

## 2019-08-21 ENCOUNTER — Ambulatory Visit (HOSPITAL_COMMUNITY): Payer: BC Managed Care – PPO

## 2019-08-26 ENCOUNTER — Ambulatory Visit (HOSPITAL_COMMUNITY): Payer: BC Managed Care – PPO

## 2019-08-27 DIAGNOSIS — I5032 Chronic diastolic (congestive) heart failure: Secondary | ICD-10-CM | POA: Insufficient documentation

## 2019-08-27 DIAGNOSIS — I319 Disease of pericardium, unspecified: Secondary | ICD-10-CM

## 2019-08-27 HISTORY — DX: Disease of pericardium, unspecified: I31.9

## 2019-08-27 NOTE — Progress Notes (Signed)
Cardiology Office Note    Date:  08/28/2019   ID:  Andrea Robertson, DOB Jul 30, 1961, MRN [id number]  PCP:  Lorenda Ishihara, MD  Cardiologist: Tobias Alexander, MD EPS: None  Chief Complaint  Patient presents with  . Hospitalization Follow-up    History of Present Illness:  Andrea Robertson is a 59 y.o. female with hx of CAD s/p NSTEMI 06/2019 DES to mLAD, minimal nonobstructive stenosis in the circumflex and RCA, echo LVEF 50 to 55% with grade 1 DD and trivial pericardial effusion, post infract pericarditis, AKI and valsartan held HTN, HLD, diabetes mellitus.  In ER 07/26/2019 for palpitations.  CTA negative for PE and monitor placed.  Patient was seen in our clinic 08/01/2019 but was still wearing the monitor no changes made that day.  Patient continued to complain of palpitations and record 8 events on the monitor and all were normal sinus rhythm.  Patient developed a rash from the monitor so it was stopped early.    In the emergency room 08/08/2019 with shortness of breath.  She had mild edema mild elevated BNP and mild heart failure-looking pattern on chest x-ray no EKG changes and troponins negative.  1 dose of IV Lasix and started on daily Lasix with recommendations for future echo.  Patient called in 08/11/2019 with elevated blood pressures and carvedilol was increased to 25 mg twice daily.  Formal monitor not read yet but preliminary reviewed and shows NSR. Patient has some dyspnea on exertion when walks a lot. Lasix has helped but BP running high.She is urinating a lot and says potassium drops when she is on a diuretic. She'd like to stop it. Patient needs 2 crowns and a bridge done by [facility] which is different from the dentist we were asked for clearance from. Also wants to start cardiac rehab.    Past Medical History:  Diagnosis Date  . AKI (acute kidney injury) (HCC)   . Arthritis   . Coronary artery disease involving native coronary artery of native heart  without angina pectoris 07/04/2019  . Depression   . Diabetes mellitus without complication (HCC)   . Essential hypertension 10/30/2018  . GERD (gastroesophageal reflux disease)   . Headache    sinus headaches  . Hyperlipidemia 07/04/2019  . PVC (premature ventricular contraction) 10/30/2018  . Shortness of breath   . Vertigo 10/30/2018    Past Surgical History:  Procedure Laterality Date  . ABDOMINAL HYSTERECTOMY    . CHOLECYSTECTOMY    . CORONARY STENT INTERVENTION N/A 07/03/2019   Procedure: CORONARY STENT INTERVENTION;  Surgeon: Tonny Bollman, MD;  Location: [facility] INVASIVE CV LAB;  Service: Cardiovascular;  Laterality: N/A;  . LEFT HEART CATH AND CORONARY ANGIOGRAPHY N/A 07/03/2019   Procedure: LEFT HEART CATH AND CORONARY ANGIOGRAPHY;  Surgeon: Tonny Bollman, MD;  Location: [facility] INVASIVE CV LAB;  Service: Cardiovascular;  Laterality: N/A;  . TOTAL KNEE ARTHROPLASTY Right 10/13/2015   Procedure: RIGHT TOTAL KNEE ARTHROPLASTY;  Surgeon: Ranee Gosselin, MD;  Location: [facility] ORS;  Service: Orthopedics;  Laterality: Right;    Current Medications: Current Meds  Medication Sig  . acetaminophen (TYLENOL) 500 MG tablet Take 1,000 mg by mouth every 6 (six) hours as needed for mild pain or headache.  Marland Kitchen aspirin EC 81 MG EC tablet Take 1 tablet (81 mg total) by mouth daily.  Marland Kitchen atorvastatin (LIPITOR) 80 MG tablet Take 1 tablet (80 mg total) by mouth daily at 6 PM.  . buPROPion (WELLBUTRIN XL) 300 MG 24 hr tablet  Take 300 mg by mouth daily.  . carvedilol (COREG) 25 MG tablet Take 1 tablet (25 mg total) by mouth 2 (two) times daily.  Marland Kitchen escitalopram (LEXAPRO) 10 MG tablet Take 10 mg by mouth daily.  Marland Kitchen FARXIGA 5 MG TABS tablet Take 5 mg by mouth daily.  . fluticasone (FLONASE) 50 MCG/ACT nasal spray Place 1 spray into both nostrils daily as needed for allergies or rhinitis.  Marland Kitchen meclizine (ANTIVERT) 25 MG tablet Take 1 tablet (25 mg total) by mouth 3 (three) times daily as needed for dizziness.  .  Multiple Vitamin (MULTIVITAMIN WITH MINERALS) TABS tablet Take 1 tablet by mouth daily.  . nitroGLYCERIN (NITROSTAT) 0.4 MG SL tablet Place 1 tablet (0.4 mg total) under the tongue every 5 (five) minutes x 3 doses as needed for chest pain.  . ticagrelor (BRILINTA) 90 MG TABS tablet Take 1 tablet (90 mg total) by mouth 2 (two) times daily.  . [DISCONTINUED] furosemide (LASIX) 20 MG tablet Take 1 tablet (20 mg total) by mouth daily.     Allergies:   Dilaudid [hydromorphone hcl], Erythromycin, Penicillins, Percocet [oxycodone-acetaminophen], and Zithromax [azithromycin]   Social History   Socioeconomic History  . Marital status: Married    Spouse name: Not on file  . Number of children: Not on file  . Years of education: Not on file  . Highest education level: Not on file  Occupational History  . Not on file  Tobacco Use  . Smoking status: Never Smoker  . Smokeless tobacco: Never Used  Substance and Sexual Activity  . Alcohol use: Yes    Comment: Social drinker  . Drug use: No  . Sexual activity: Yes  Other Topics Concern  . Not on file  Social History Narrative  . Not on file   Social Determinants of Health   Financial Resource Strain:   . Difficulty of Paying Living Expenses: Not on file  Food Insecurity:   . Worried About Programme researcher, broadcasting/film/video in the Last Year: Not on file  . Ran Out of Food in the Last Year: Not on file  Transportation Needs:   . Lack of Transportation (Medical): Not on file  . Lack of Transportation (Non-Medical): Not on file  Physical Activity:   . Days of Exercise per Week: Not on file  . Minutes of Exercise per Session: Not on file  Stress:   . Feeling of Stress : Not on file  Social Connections:   . Frequency of Communication with Friends and Family: Not on file  . Frequency of Social Gatherings with Friends and Family: Not on file  . Attends Religious Services: Not on file  . Active Member of Clubs or Organizations: Not on file  . Attends Occupational hygienist Meetings: Not on file  . Marital Status: Not on file     Family History:  The patient's   family history includes CAD in her father.   ROS:   Please see the history of present illness.    ROS All other systems reviewed and are negative.   PHYSICAL EXAM:   VS:  BP (!) 140/96   Pulse 88   Ht  (1.6 m)   Wt 180 lb 9.6 oz (81.9 kg)   SpO2 97%   BMI 31.99 kg/m   Physical Exam  GEN: Well nourished, well developed, in no acute distress  Neck: no JVD, carotid bruits, or masses Cardiac:RRR; no murmurs, rubs, or gallops  Respiratory:  clear to auscultation bilaterally,  normal work of breathing GI: soft, nontender, nondistended, + BS Ext: without cyanosis, clubbing, or edema, Good distal pulses bilaterally Neuro:  Alert and Oriented x 3 Psych: euthymic mood, full affect  Wt Readings from Last 3 Encounters:  08/28/19 180 lb 9.6 oz (81.9 kg)  08/08/19 181 lb (82.1 kg)  08/01/19 183 lb (83 kg)      Studies/Labs Reviewed:   EKG:  EKG is not ordered today.    Recent Labs: 07/25/2019: TSH 1.360 08/08/2019: B Natriuretic Peptide 237.7; BUN 22; Creatinine, Ser 1.59; Hemoglobin 11.9; Platelets 358; Potassium 3.8; Sodium 137   Lipid Panel    Component Value Date/Time   CHOL 197 07/04/2019 0912   TRIG 183 (H) 07/04/2019 0912   HDL 47 07/04/2019 0912   CHOLHDL 4.2 07/04/2019 0912   VLDL 37 07/04/2019 0912   LDLCALC 113 (H) 07/04/2019 0912    Additional studies/ records that were reviewed today include:   Preliminary monitor-NSR without arrhythmias  Cardiac catheterization 07/03/2019  Total occlusion of the mid-LAD, treated successfully with PCI using a 2.5x20 mm Synergy DES 2. Mild nonobstructive stenosis of the LCx and RCA 3. Mildly elevated LVEDP   Recommend: aggressive medical therapy, DAPT with ASA and ticagrelor x 12 months without interruption  2D echo 11/11/2020IMPRESSIONS      1. Left ventricular ejection fraction, by visual estimation, is 50  to 55%. The left ventricle has normal function. Left ventricular septal wall thickness was mildly increased. There is no left ventricular hypertrophy.  2. Left ventricular diastolic parameters are consistent with Grade I diastolic dysfunction (impaired relaxation).  3. Hypokinesis of the mid to apical anteroseptum, apical anterior, and apical myocardium.  4. Global right ventricle has normal systolic function.The right ventricular size is normal. No increase in right ventricular wall thickness.  5. Left atrial size was normal.  6. Right atrial size was normal.  7. Trivial pericardial effusion is present.  8. The mitral valve is normal in structure. No evidence of mitral valve regurgitation. No evidence of mitral stenosis.  9. The tricuspid valve is normal in structure. Tricuspid valve regurgitation is trivial. 10. The aortic valve is tricuspid. Aortic valve regurgitation is trivial. No evidence of aortic valve sclerosis or stenosis. 11. The pulmonic valve was normal in structure. Pulmonic valve regurgitation is trivial. 12. Normal pulmonary artery systolic pressure. 13. The inferior vena cava is normal in size with greater than 50% respiratory variability, suggesting right atrial pressure of 3 mmHg.  ASSESSMENT:    1. Coronary artery disease involving native coronary artery of native heart without angina pectoris   2. Pericarditis, unspecified chronicity, unspecified type   3. Diastolic CHF, chronic (HCC)   4. AKI (acute kidney injury) (HCC)   5. Essential hypertension   6. Hyperlipidemia, unspecified hyperlipidemia type   7. Preoperative clearance      PLAN:  In order of problems listed above:  CAD status post NSTEMI 06/2019 treated with DES to the mid LAD with minimal nonobstructive stenosis in the circumflex and RCA.  Normal LV function with trivial pericardial effusion on echo-no angina  Post infarct pericarditis-no further chest pain  CHF with elevated BNP to 37 mild edema and  abnormal chest x-ray in ER 08/08/2019-no edema and would like to stop lasix. Will stop and check bmet. 2 gm sodium diet. Normal LVEF on echo with DD  AKI after MI ARB held Crt 1.59 08/08/19  Essential hypertension BP running high. Was on amlodipine 10 mg and valsartan prior to MI. Will restart  amlodipine 5 mg once daily. F/u with me in 2 weeks  Hyperlipidemia on lipitor LDL 113 07/04/19 will need FLP next month  Palpitations-preliminary monitor reviewed and shows NSR without arrhythmia  Preop clearance for dental work-crown and bridge. She will call us with dentist at [facility]. She can have it done as long as she doesn't have to stop brilinta and ASA.   Medication Adjustments/Labs and Tests Ordered: Current medicines are reviewed at length with the patient today.  Concerns regarding medicines are outlined above.  Medication changes, Labs and Tests ordered today are listed in the Patient Instructions below. Patient Instructions   Medication Instructions:  Your physician has recommended you make the following change in your medication:   1. STOP: furosemide (lasix)  2. START: amlodipine (norvasc) 5 mg once a day  *If you need a refill on your cardiac medications before your next appointment, please call your pharmacy*  Lab Work: TODAY: BMET  If you have labs (blood work) drawn today and your tests are completely normal, you will receive your results only by: Marland Kitchen MyChart Message (if you have MyChart) OR . A paper copy in the mail If you have any lab test that is abnormal or we need to change your treatment, we will call you to review the results.  Testing/Procedures: None ordered  Follow-Up: Follow up with Ermalinda Barrios, PA on 09/11/19 at 11:00 AM  Other Instructions Two Gram Sodium Diet 2000 mg  What is Sodium? Sodium is a mineral found naturally in many foods. The most significant source of sodium in the diet is table salt, which is about 40% sodium.  Processed,  convenience, and preserved foods also contain a large amount of sodium.  The body needs only 500 mg of sodium daily to function,  A normal diet provides more than enough sodium even if you do not use salt.  Why Limit Sodium? A build up of sodium in the body can cause thirst, increased blood pressure, shortness of breath, and water retention.  Decreasing sodium in the diet can reduce edema and risk of heart attack or stroke associated with high blood pressure.  Keep in mind that there are many other factors involved in these health problems.  Heredity, obesity, lack of exercise, cigarette smoking, stress and what you eat all play a role.  General Guidelines:  Do not add salt at the table or in cooking.  One teaspoon of salt contains over 2 grams of sodium.  Read food labels  Avoid processed and convenience foods  Ask your dietitian before eating any foods not dicussed in the menu planning guidelines  Consult your physician if you wish to use a salt substitute or a sodium containing medication such as antacids.  Limit milk and milk products to 16 oz (2 cups) per day.  Shopping Hints:  READ LABELS!! "Dietetic" does not necessarily mean low sodium.  Salt and other sodium ingredients are often added to foods during processing.   Menu Planning Guidelines Food Group Choose More Often Avoid  Beverages (see also the milk group All fruit juices, low-sodium, salt-free vegetables juices, low-sodium carbonated beverages Regular vegetable or tomato juices, commercially softened water used for drinking or cooking  Breads and Cereals Enriched white, wheat, rye and pumpernickel bread, hard rolls and dinner rolls; muffins, cornbread and waffles; most dry cereals, cooked cereal without added salt; unsalted crackers and breadsticks; low sodium or homemade bread crumbs Bread, rolls and crackers with salted tops; quick breads; instant hot cereals; pancakes; commercial  bread stuffing; self-rising flower and  biscuit mixes; regular bread crumbs or cracker crumbs  Desserts and Sweets Desserts and sweets mad with mild should be within allowance Instant pudding mixes and cake mixes  Fats Butter or margarine; vegetable oils; unsalted salad dressings, regular salad dressings limited to 1 Tbs; light, sour and heavy cream Regular salad dressings containing bacon fat, bacon bits, and salt pork; snack dips made with instant soup mixes or processed cheese; salted nuts  Fruits Most fresh, frozen and canned fruits Fruits processed with salt or sodium-containing ingredient (some dried fruits are processed with sodium sulfites        Vegetables Fresh, frozen vegetables and low- sodium canned vegetables Regular canned vegetables, sauerkraut, pickled vegetables, and others prepared in brine; frozen vegetables in sauces; vegetables seasoned with ham, bacon or salt pork  Condiments, Sauces, Miscellaneous  Salt substitute with physician's approval; pepper, herbs, spices; vinegar, lemon or lime juice; hot pepper sauce; garlic powder, onion powder, low sodium soy sauce (1 Tbs.); low sodium condiments (ketchup, chili sauce, mustard) in limited amounts (1 tsp.) fresh ground horseradish; unsalted tortilla chips, pretzels, potato chips, popcorn, salsa (1/4 cup) Any seasoning made with salt including garlic salt, celery salt, onion salt, and seasoned salt; sea salt, rock salt, kosher salt; meat tenderizers; monosodium glutamate; mustard, regular soy sauce, barbecue, sauce, chili sauce, teriyaki sauce, steak sauce, Worcestershire sauce, and most flavored vinegars; canned gravy and mixes; regular condiments; salted snack foods, olives, picles, relish, horseradish sauce, catsup   Food preparation: Try these seasonings Meats:    Pork Sage, onion Serve with applesauce  Chicken Poultry seasoning, thyme, parsley Serve with cranberry sauce  Lamb Curry powder, rosemary, garlic, thyme Serve with mint sauce or jelly  Veal Marjoram, basil  Serve with current jelly, cranberry sauce  Beef Pepper, bay leaf Serve with dry mustard, unsalted chive butter  Fish [place] leaf, dill Serve with unsalted lemon butter, unsalted parsley butter  Vegetables:    Asparagus Lemon juice   Broccoli Lemon juice   Carrots Mustard dressing parsley, mint, nutmeg, glazed with unsalted butter and sugar   Green beans Marjoram, lemon juice, nutmeg,dill seed   Tomatoes Basil, marjoram, onion   Spice /blend for [employer]" 4 tsp ground thyme 1 tsp ground sage 3 tsp ground rosemary 4 tsp ground marjoram   Test your knowledge 1. A product that says "Salt Free" may still contain sodium. True or False 2. Garlic Powder and Hot Pepper Sauce an be used as alternative seasonings.True or False 3. Processed foods have more sodium than fresh foods.  True or False 4. Canned Vegetables have less sodium than froze True or False  WAYS TO DECREASE YOUR SODIUM INTAKE 1. Avoid the use of added salt in cooking and at the table.  Table salt (and other prepared seasonings which contain salt) is probably one of the greatest sources of sodium in the diet.  Unsalted foods can gain flavor from the sweet, sour, and butter taste sensations of herbs and spices.  Instead of using salt for seasoning, try the following seasonings with the foods listed.  Remember: how you use them to enhance natural food flavors is limited only by your creativity... Allspice-Meat, fish, eggs, fruit, peas, red and yellow vegetables Almond Extract-Fruit baked goods Anise Seed-Sweet breads, fruit, carrots, beets, cottage cheese, cookies (tastes like licorice) Basil-Meat, fish, eggs, vegetables, rice, vegetables salads, soups, sauces Bay Leaf-Meat, fish, stews, poultry Burnet-Salad, vegetables (cucumber-like flavor) Caraway Seed-Bread, cookies, cottage cheese, meat, vegetables, cheese, rice Cardamon-Baked goods,  fruit, soups Celery Powder or seed-Salads, salad dressings, sauces, meatloaf, soup, bread.Do not  use  celery salt Chervil-Meats, salads, fish, eggs, vegetables, cottage cheese (parsley-like flavor) Chili Power-Meatloaf, chicken cheese, corn, eggplant, egg dishes Chives-Salads cottage cheese, egg dishes, soups, vegetables, sauces Cilantro-Salsa, casseroles Cinnamon-Baked goods, fruit, pork, lamb, chicken, carrots Cloves-Fruit, baked goods, fish, pot roast, green beans, beets, carrots Coriander-Pastry, cookies, meat, salads, cheese (lemon-orange flavor) Cumin-Meatloaf, fish,cheese, eggs, cabbage,fruit pie (caraway flavor) [employer], fruit, eggs, fish, poultry, cottage cheese, vegetables Dill Seed-Meat, cottage cheese, poultry, vegetables, fish, salads, bread Fennel Seed-Bread, cookies, apples, pork, eggs, fish, beets, cabbage, cheese, Licorice-like flavor Garlic-(buds or powder) Salads, meat, poultry, fish, bread, butter, vegetables, potatoes.Do not  use garlic salt Ginger-Fruit, vegetables, baked goods, meat, fish, poultry Horseradish Root-Meet, vegetables, butter Lemon Juice or Extract-Vegetables, fruit, tea, baked goods, fish salads Mace-Baked goods fruit, vegetables, fish, poultry (taste like nutmeg) Maple Extract-Syrups Marjoram-Meat, chicken, fish, vegetables, breads, green salads (taste like Sage) Mint-Tea, lamb, sherbet, vegetables, desserts, carrots, cabbage Mustard, Dry or Seed-Cheese, eggs, meats, vegetables, poultry Nutmeg-Baked goods, fruit, chicken, eggs, vegetables, desserts Onion Powder-Meat, fish, poultry, vegetables, cheese, eggs, bread, rice salads (Do not use   Onion salt) Orange Extract-Desserts, baked goods Oregano-Pasta, eggs, cheese, onions, pork, lamb, fish, chicken, vegetables, green salads Paprika-Meat, fish, poultry, eggs, cheese, vegetables Parsley Flakes-Butter, vegetables, meat fish, poultry, eggs, bread, salads (certain forms may   Contain sodium Pepper-Meat fish, poultry, vegetables, eggs Peppermint Extract-Desserts, baked goods Poppy  Seed-Eggs, bread, cheese, fruit dressings, baked goods, noodles, vegetables, cottage  [employer], poultry, meat, fish, cauliflower, turnips,eggs bread Saffron-Rice, bread, veal, chicken, fish, eggs Sage-Meat, fish, poultry, onions, eggplant, tomateos, pork, stews Savory-Eggs, salads, poultry, meat, rice, vegetables, soups, pork Tarragon-Meat, poultry, fish, eggs, butter, vegetables (licorice-like flavor)  Thyme-Meat, poultry, fish, eggs, vegetables, (clover-like flavor), sauces, soups Tumeric-Salads, butter, eggs, fish, rice, vegetables (saffron-like flavor) Vanilla Extract-Baked goods, candy Vinegar-Salads, vegetables, meat marinades Walnut Extract-baked goods, candy  2. Choose your Foods Wisely   The following is a list of foods to avoid which are high in sodium:  Meats-Avoid all smoked, canned, salt cured, dried and kosher meat and fish as well as Anchovies   [employer] meats:Bologna, Liverwurst, Pastrami Canned meat or fish  Marinated herring Caviar    Pepperoni Corned Beef   Pizza Dried chipped beef  Salami Frozen breaded fish or meat Salt pork Frankfurters or hot dogs  Sardines Gefilte fish   Sausage Ham (boiled ham, Proscuitto Smoked butt    spiced ham)   Spam      TV Dinners Vegetables Canned vegetables (Regular) Relish Canned mushrooms  Sauerkraut Olives    Tomato juice Pickles  Bakery and Dessert Products Canned puddings  Cream pies Cheesecake   Decorated cakes Cookies  Beverages/Juices Tomato juice, regular  Gatorade   V-8 vegetable juice, regular  Breads and Cereals Biscuit mixes   Salted potato chips, corn chips, pretzels Bread stuffing mixes  Salted crackers and rolls Pancake and waffle mixes Self-rising flour  Seasonings Accent    Meat sauces Barbecue sauce  Meat tenderizer Catsup    Monosodium glutamate (MSG) Celery salt   Onion salt Chili sauce   Prepared mustard Garlic salt   Salt, seasoned  salt, sea salt Gravy mixes   Soy sauce Horseradish   Steak sauce Ketchup   Tartar sauce Lite salt    Teriyaki sauce Marinade mixes   Worcestershire sauce  Others Baking powder   Cocoa and cocoa mixes Baking soda  Commercial casserole mixes Candy-caramels, chocolate  Dehydrated soups    Bars, fudge,nougats  Instant rice and pasta mixes Canned broth or soup  Maraschino cherries Cheese, aged and processed cheese and cheese spreads  Learning Assessment Quiz  Indicated T (for True) or F (for False) for each of the following statements:  1. _____ Fresh fruits and vegetables and unprocessed grains are generally low in sodium 2. _____ Water may contain a considerable amount of sodium, depending on the source 3. _____ You can always tell if a food is high in sodium by tasting it 4. _____ Certain laxatives my be high in sodium and should be avoided unless prescribed   by a physician or pharmacist 5. _____ Salt substitutes may be used freely by anyone on a sodium restricted diet 6. _____ Sodium is present in table salt, food additives and as a natural component of   most foods 7. _____ Table salt is approximately 90% sodium 8. _____ Limiting sodium intake may help prevent excess fluid accumulation in the body 9. _____ On a sodium-restricted diet, seasonings such as bouillon soy sauce, and    cooking wine should be used in place of table salt 10. _____ On an ingredient list, a product which lists monosodium glutamate as the first   ingredient is an appropriate food to include on a low sodium diet  Circle the best answer(s) to the following statements (Hint: there may be more than one correct answer)  11. On a low-sodium diet, some acceptable snack items are:    A. Olives  F. Bean dip   K. Grapefruit juice    B. Salted Pretzels G. Commercial Popcorn   L. Canned peaches    C. Carrot Sticks  H. Bouillon   M. Unsalted nuts   D. [place] fries  I. Peanut butter crackers N. Salami   E. Sweet  pickles J. Tomato Juice   O. Pizza  12.  Seasonings that may be used freely on a reduced - sodium diet include   A. Lemon wedges F.Monosodium glutamate K. Celery seed    B.Soysauce   G. Pepper   L. Mustard powder   C. Sea salt  H. Cooking wine  M. Onion flakes   D. Vinegar  E. Prepared horseradish N. Salsa   E. Sage   J. Worcestershire sauce  O. [address]      Elson Clan, PA-C  08/28/2019 10:19 AM    [facility] [address] [place], [place], [place]  [address] Phone: [phone]; Fax: [phone]

## 2019-08-28 ENCOUNTER — Ambulatory Visit: Payer: BC Managed Care – PPO | Admitting: Physician Assistant

## 2019-08-28 ENCOUNTER — Ambulatory Visit (HOSPITAL_COMMUNITY): Payer: BC Managed Care – PPO

## 2019-08-28 ENCOUNTER — Other Ambulatory Visit: Payer: Self-pay

## 2019-08-28 ENCOUNTER — Encounter: Payer: Self-pay | Admitting: Physician Assistant

## 2019-08-28 VITALS — BP 140/96 | HR 88 | Ht 63.0 in | Wt 180.6 lb

## 2019-08-28 DIAGNOSIS — I5032 Chronic diastolic (congestive) heart failure: Secondary | ICD-10-CM

## 2019-08-28 DIAGNOSIS — N179 Acute kidney failure, unspecified: Secondary | ICD-10-CM | POA: Diagnosis not present

## 2019-08-28 DIAGNOSIS — I2510 Atherosclerotic heart disease of native coronary artery without angina pectoris: Secondary | ICD-10-CM

## 2019-08-28 DIAGNOSIS — I10 Essential (primary) hypertension: Secondary | ICD-10-CM

## 2019-08-28 DIAGNOSIS — Z01818 Encounter for other preprocedural examination: Secondary | ICD-10-CM

## 2019-08-28 DIAGNOSIS — I319 Disease of pericardium, unspecified: Secondary | ICD-10-CM | POA: Diagnosis not present

## 2019-08-28 DIAGNOSIS — E785 Hyperlipidemia, unspecified: Secondary | ICD-10-CM

## 2019-08-28 LAB — BASIC METABOLIC PANEL
BUN/Creatinine Ratio: 13 (ref 9–23)
BUN: 21 mg/dL (ref 6–24)
CO2: 26 mmol/L (ref 20–29)
Calcium: 9.5 mg/dL (ref 8.7–10.2)
Chloride: 99 mmol/L (ref 96–106)
Creatinine, Ser: 1.68 mg/dL — ABNORMAL HIGH (ref 0.57–1.00)
GFR calc Af Amer: 39 mL/min/{1.73_m2} — ABNORMAL LOW (ref >59)
GFR calc non Af Amer: 34 mL/min/{1.73_m2} — ABNORMAL LOW (ref >59)
Glucose: 180 mg/dL — ABNORMAL HIGH (ref 65–99)
Potassium: 3.9 mmol/L (ref 3.5–5.2)
Sodium: 141 mmol/L (ref 134–144)

## 2019-08-28 MED ORDER — AMLODIPINE BESYLATE 5 MG PO TABS: 5.0000 mg | ORAL_TABLET | Freq: Every day | ORAL | 3 refills | Status: DC

## 2019-08-28 NOTE — Patient Instructions (Addendum)
Medication Instructions:  Your physician has recommended you make the following change in your medication:   1. STOP: furosemide (lasix)  2. START: amlodipine (norvasc) 5 mg once a day  *If you need a refill on your cardiac medications before your next appointment, please call your pharmacy*  Lab Work: TODAY: BMET  If you have labs (blood work) drawn today and your tests are completely normal, you will receive your results only by: Marland Kitchen MyChart Message (if you have MyChart) OR . A paper copy in the mail If you have any lab test that is abnormal or we need to change your treatment, we will call you to review the results.  Testing/Procedures: None ordered  Follow-Up: Follow up with Jacolyn Reedy, PA on 09/11/19 at 11:00 AM  Other Instructions Two Gram Sodium Diet 2000 mg  What is Sodium? Sodium is a mineral found naturally in many foods. The most significant source of sodium in the diet is table salt, which is about 40% sodium.  Processed, convenience, and preserved foods also contain a large amount of sodium.  The body needs only 500 mg of sodium daily to function,  A normal diet provides more than enough sodium even if you do not use salt.  Why Limit Sodium? A build up of sodium in the body can cause thirst, increased blood pressure, shortness of breath, and water retention.  Decreasing sodium in the diet can reduce edema and risk of heart attack or stroke associated with high blood pressure.  Keep in mind that there are many other factors involved in these health problems.  Heredity, obesity, lack of exercise, cigarette smoking, stress and what you eat all play a role.  General Guidelines:  Do not add salt at the table or in cooking.  One teaspoon of salt contains over 2 grams of sodium.  Read food labels  Avoid processed and convenience foods  Ask your dietitian before eating any foods not dicussed in the menu planning guidelines  Consult your physician if you wish to use a salt  substitute or a sodium containing medication such as antacids.  Limit milk and milk products to 16 oz (2 cups) per day.  Shopping Hints:  READ LABELS!! "Dietetic" does not necessarily mean low sodium.  Salt and other sodium ingredients are often added to foods during processing.   Menu Planning Guidelines Food Group Choose More Often Avoid  Beverages (see also the milk group All fruit juices, low-sodium, salt-free vegetables juices, low-sodium carbonated beverages Regular vegetable or tomato juices, commercially softened water used for drinking or cooking  Breads and Cereals Enriched white, wheat, rye and pumpernickel bread, hard rolls and dinner rolls; muffins, cornbread and waffles; most dry cereals, cooked cereal without added salt; unsalted crackers and breadsticks; low sodium or homemade bread crumbs Bread, rolls and crackers with salted tops; quick breads; instant hot cereals; pancakes; commercial bread stuffing; self-rising flower and biscuit mixes; regular bread crumbs or cracker crumbs  Desserts and Sweets Desserts and sweets mad with mild should be within allowance Instant pudding mixes and cake mixes  Fats Butter or margarine; vegetable oils; unsalted salad dressings, regular salad dressings limited to 1 Tbs; light, sour and heavy cream Regular salad dressings containing bacon fat, bacon bits, and salt pork; snack dips made with instant soup mixes or processed cheese; salted nuts  Fruits Most fresh, frozen and canned fruits Fruits processed with salt or sodium-containing ingredient (some dried fruits are processed with sodium sulfites  Vegetables Fresh, frozen vegetables and low- sodium canned vegetables Regular canned vegetables, sauerkraut, pickled vegetables, and others prepared in brine; frozen vegetables in sauces; vegetables seasoned with ham, bacon or salt pork  Condiments, Sauces, Miscellaneous  Salt substitute with physician's approval; pepper, herbs, spices;  vinegar, lemon or lime juice; hot pepper sauce; garlic powder, onion powder, low sodium soy sauce (1 Tbs.); low sodium condiments (ketchup, chili sauce, mustard) in limited amounts (1 tsp.) fresh ground horseradish; unsalted tortilla chips, pretzels, potato chips, popcorn, salsa (1/4 cup) Any seasoning made with salt including garlic salt, celery salt, onion salt, and seasoned salt; sea salt, rock salt, kosher salt; meat tenderizers; monosodium glutamate; mustard, regular soy sauce, barbecue, sauce, chili sauce, teriyaki sauce, steak sauce, Worcestershire sauce, and most flavored vinegars; canned gravy and mixes; regular condiments; salted snack foods, olives, picles, relish, horseradish sauce, catsup   Food preparation: Try these seasonings Meats:    Pork Sage, onion Serve with applesauce  Chicken Poultry seasoning, thyme, parsley Serve with cranberry sauce  Lamb Curry powder, rosemary, garlic, thyme Serve with mint sauce or jelly  Veal Marjoram, basil Serve with current jelly, cranberry sauce  Beef Pepper, bay leaf Serve with dry mustard, unsalted chive butter  Fish [place] leaf, dill Serve with unsalted lemon butter, unsalted parsley butter  Vegetables:    Asparagus Lemon juice   Broccoli Lemon juice   Carrots Mustard dressing parsley, mint, nutmeg, glazed with unsalted butter and sugar   Green beans Marjoram, lemon juice, nutmeg,dill seed   Tomatoes Basil, marjoram, onion   Spice /blend for [employer]" 4 tsp ground thyme 1 tsp ground sage 3 tsp ground rosemary 4 tsp ground marjoram   Test your knowledge 1. A product that says "Salt Free" may still contain sodium. True or False 2. Garlic Powder and Hot Pepper Sauce an be used as alternative seasonings.True or False 3. Processed foods have more sodium than fresh foods.  True or False 4. Canned Vegetables have less sodium than froze True or False  WAYS TO DECREASE YOUR SODIUM INTAKE 1. Avoid the use of added salt in cooking and at the  table.  Table salt (and other prepared seasonings which contain salt) is probably one of the greatest sources of sodium in the diet.  Unsalted foods can gain flavor from the sweet, sour, and butter taste sensations of herbs and spices.  Instead of using salt for seasoning, try the following seasonings with the foods listed.  Remember: how you use them to enhance natural food flavors is limited only by your creativity... Allspice-Meat, fish, eggs, fruit, peas, red and yellow vegetables Almond Extract-Fruit baked goods Anise Seed-Sweet breads, fruit, carrots, beets, cottage cheese, cookies (tastes like licorice) Basil-Meat, fish, eggs, vegetables, rice, vegetables salads, soups, sauces Bay Leaf-Meat, fish, stews, poultry Burnet-Salad, vegetables (cucumber-like flavor) Caraway Seed-Bread, cookies, cottage cheese, meat, vegetables, cheese, rice Cardamon-Baked goods, fruit, soups Celery Powder or seed-Salads, salad dressings, sauces, meatloaf, soup, bread.Do not use  celery salt Chervil-Meats, salads, fish, eggs, vegetables, cottage cheese (parsley-like flavor) Chili Power-Meatloaf, chicken cheese, corn, eggplant, egg dishes Chives-Salads cottage cheese, egg dishes, soups, vegetables, sauces Cilantro-Salsa, casseroles Cinnamon-Baked goods, fruit, pork, lamb, chicken, carrots Cloves-Fruit, baked goods, fish, pot roast, green beans, beets, carrots Coriander-Pastry, cookies, meat, salads, cheese (lemon-orange flavor) Cumin-Meatloaf, fish,cheese, eggs, cabbage,fruit pie (caraway flavor) [employer], fruit, eggs, fish, poultry, cottage cheese, vegetables Dill Seed-Meat, cottage cheese, poultry, vegetables, fish, salads, bread Fennel Seed-Bread, cookies, apples, pork, eggs, fish, beets, cabbage, cheese, Licorice-like flavor Garlic-(buds or powder)  Salads, meat, poultry, fish, bread, butter, vegetables, potatoes.Do not  use garlic salt Ginger-Fruit, vegetables, baked goods, meat, fish,  poultry Horseradish Root-Meet, vegetables, butter Lemon Juice or Extract-Vegetables, fruit, tea, baked goods, fish salads Mace-Baked goods fruit, vegetables, fish, poultry (taste like nutmeg) Maple Extract-Syrups Marjoram-Meat, chicken, fish, vegetables, breads, green salads (taste like Sage) Mint-Tea, lamb, sherbet, vegetables, desserts, carrots, cabbage Mustard, Dry or Seed-Cheese, eggs, meats, vegetables, poultry Nutmeg-Baked goods, fruit, chicken, eggs, vegetables, desserts Onion Powder-Meat, fish, poultry, vegetables, cheese, eggs, bread, rice salads (Do not use   Onion salt) Orange Extract-Desserts, baked goods Oregano-Pasta, eggs, cheese, onions, pork, lamb, fish, chicken, vegetables, green salads Paprika-Meat, fish, poultry, eggs, cheese, vegetables Parsley Flakes-Butter, vegetables, meat fish, poultry, eggs, bread, salads (certain forms may   Contain sodium Pepper-Meat fish, poultry, vegetables, eggs Peppermint Extract-Desserts, baked goods Poppy Seed-Eggs, bread, cheese, fruit dressings, baked goods, noodles, vegetables, cottage  [employer], poultry, meat, fish, cauliflower, turnips,eggs bread Saffron-Rice, bread, veal, chicken, fish, eggs Sage-Meat, fish, poultry, onions, eggplant, tomateos, pork, stews Savory-Eggs, salads, poultry, meat, rice, vegetables, soups, pork Tarragon-Meat, poultry, fish, eggs, butter, vegetables (licorice-like flavor)  Thyme-Meat, poultry, fish, eggs, vegetables, (clover-like flavor), sauces, soups Tumeric-Salads, butter, eggs, fish, rice, vegetables (saffron-like flavor) Vanilla Extract-Baked goods, candy Vinegar-Salads, vegetables, meat marinades Walnut Extract-baked goods, candy  2. Choose your Foods Wisely   The following is a list of foods to avoid which are high in sodium:  Meats-Avoid all smoked, canned, salt cured, dried and kosher meat and fish as well as Anchovies   [employer]  meats:Bologna, Liverwurst, Pastrami Canned meat or fish  Marinated herring Caviar    Pepperoni Corned Beef   Pizza Dried chipped beef  Salami Frozen breaded fish or meat Salt pork Frankfurters or hot dogs  Sardines Gefilte fish   Sausage Ham (boiled ham, Proscuitto Smoked butt    spiced ham)   Spam      TV Dinners Vegetables Canned vegetables (Regular) Relish Canned mushrooms  Sauerkraut Olives    Tomato juice Pickles  Bakery and Dessert Products Canned puddings  Cream pies Cheesecake   Decorated cakes Cookies  Beverages/Juices Tomato juice, regular  Gatorade   V-8 vegetable juice, regular  Breads and Cereals Biscuit mixes   Salted potato chips, corn chips, pretzels Bread stuffing mixes  Salted crackers and rolls Pancake and waffle mixes Self-rising flour  Seasonings Accent    Meat sauces Barbecue sauce  Meat tenderizer Catsup    Monosodium glutamate (MSG) Celery salt   Onion salt Chili sauce   Prepared mustard Garlic salt   Salt, seasoned salt, sea salt Gravy mixes   Soy sauce Horseradish   Steak sauce Ketchup   Tartar sauce Lite salt    Teriyaki sauce Marinade mixes   Worcestershire sauce  Others Baking powder   Cocoa and cocoa mixes Baking soda   Commercial casserole mixes Candy-caramels, chocolate  Dehydrated soups    Bars, fudge,nougats  Instant rice and pasta mixes Canned broth or soup  Maraschino cherries Cheese, aged and processed cheese and cheese spreads  Learning Assessment Quiz  Indicated T (for True) or F (for False) for each of the following statements:  1. _____ Fresh fruits and vegetables and unprocessed grains are generally low in sodium 2. _____ Water may contain a considerable amount of sodium, depending on the source 3. _____ You can always tell if a food is high in sodium by tasting it 4. _____ Certain laxatives my be high in sodium and  should be avoided unless prescribed   by a physician or pharmacist 5. _____ Salt substitutes may be  used freely by anyone on a sodium restricted diet 6. _____ Sodium is present in table salt, food additives and as a natural component of   most foods 7. _____ Table salt is approximately 90% sodium 8. _____ Limiting sodium intake may help prevent excess fluid accumulation in the body 9. _____ On a sodium-restricted diet, seasonings such as bouillon soy sauce, and    cooking wine should be used in place of table salt 10. _____ On an ingredient list, a product which lists monosodium glutamate as the first   ingredient is an appropriate food to include on a low sodium diet  Circle the best answer(s) to the following statements (Hint: there may be more than one correct answer)  11. On a low-sodium diet, some acceptable snack items are:    A. Olives  F. Bean dip   K. Grapefruit juice    B. Salted Pretzels G. Commercial Popcorn   L. Canned peaches    C. Carrot Sticks  H. Bouillon   M. Unsalted nuts   D. [place] fries  I. Peanut butter crackers N. Salami   E. Sweet pickles J. Tomato Juice   O. Pizza  12.  Seasonings that may be used freely on a reduced - sodium diet include   A. Lemon wedges F.Monosodium glutamate K. Celery seed    B.Soysauce   G. Pepper   L. Mustard powder   C. Sea salt  H. Cooking wine  M. Onion flakes   D. Vinegar  E. Prepared horseradish N. Salsa   E. Sage   J. Worcestershire sauce  O. Chutney

## 2019-08-29 ENCOUNTER — Telehealth (HOSPITAL_COMMUNITY): Payer: Self-pay

## 2019-08-29 ENCOUNTER — Encounter (HOSPITAL_COMMUNITY)
Admission: RE | Admit: 2019-08-29 | Discharge: 2019-08-29 | Disposition: A | Discharge status: Home / Self Care | Payer: Self-pay | Source: Ambulatory Visit | Attending: Cardiology | Admitting: Cardiology

## 2019-08-29 NOTE — Telephone Encounter (Signed)
Confirm Consent - In the setting of the current Covid19 crisis, you are scheduled for a phone visit with your Cardiac or Pulmonary team member.  Just as we do with many in-gym visits, in order for you to participate in this visit, we must obtain consent.  If you'd like, I can send this to your mychart (if signed up) or email for you to review.  Otherwise, I can obtain your verbal consent now.  By agreeing to a telephone visit, we'd like you to understand that the technology does not allow for your Cardiac or Pulmonary Rehab team member to perform a physical assessment, and thus may limit their ability to fully assess your ability to perform exercise programs. If your provider identifies any concerns that need to be evaluated in person, we will make arrangements to do so.  Finally, though the technology is pretty good, we cannot assure that it will always work on either your or our end and we cannot ensure that we have a secure connection.  Cardiac and Pulmonary Rehab Telehealth visits and "At Home" cardiac and pulmonary rehab are provided at no cost to you.        Are you willing to proceed?"        STAFF: Did the patient verbally acknowledge consent to telehealth visit? Document YES/NO here: Yes     Tempie Donning. Support Rep II  Cardiac and Pulmonary Rehab Staff        Date 08/29/2019    @ Time 2:57pm

## 2019-08-30 ENCOUNTER — Ambulatory Visit (HOSPITAL_COMMUNITY): Payer: BC Managed Care – PPO

## 2019-09-02 ENCOUNTER — Ambulatory Visit (HOSPITAL_COMMUNITY): Payer: BC Managed Care – PPO

## 2019-09-02 ENCOUNTER — Telehealth (HOSPITAL_COMMUNITY): Payer: Self-pay | Admitting: *Deleted

## 2019-09-02 ENCOUNTER — Other Ambulatory Visit: Payer: Self-pay

## 2019-09-02 ENCOUNTER — Encounter (HOSPITAL_COMMUNITY)
Admission: RE | Admit: 2019-09-02 | Discharge: 2019-09-02 | Disposition: A | Discharge status: Home / Self Care | Payer: Self-pay | Source: Ambulatory Visit | Attending: Cardiology | Admitting: Cardiology

## 2019-09-02 NOTE — Telephone Encounter (Signed)
Andrea Robertson documented that she had shortness of breath on the virtual cardiac rehab APP when she exercised on Friday  08/30/19 during a 10 minute walk. Izora says that she felt like she overdid it. Shameka says she feel fine today. Bridgett has some diet questions. Will have Sharyl Nimrod RD contact the patient about a dietary consultation. Adreena says that she enjoying using the APP so far and has bought an Apple Watch to help with her steps and fitness.Gladstone Lighter, RN,BSN 09/02/2019 9:04 AM

## 2019-09-04 ENCOUNTER — Ambulatory Visit (HOSPITAL_COMMUNITY): Payer: BC Managed Care – PPO

## 2019-09-04 ENCOUNTER — Other Ambulatory Visit: Payer: Self-pay

## 2019-09-04 ENCOUNTER — Inpatient Hospital Stay (HOSPITAL_COMMUNITY)
Admission: RE | Admit: 2019-09-04 | Discharge: 2019-09-04 | Disposition: A | Discharge status: Home / Self Care | Payer: BC Managed Care – PPO | Source: Ambulatory Visit

## 2019-09-04 NOTE — Progress Notes (Signed)
Andrea Robertson 59 y.o. female Nutrition Note - Virtual cardiac rehab Spoke with Andrea Robertson on the phone. Time spent on assessment and education was 70 minutes. Pt very interested and motivated to make diet changes. She is interested in managing diabetes and heart disease as well as weight loss. She has already lost 20 lbs since NSTEMI with diet changes. She is eating the same things daily and feels bored of them. She wants to be able to make this a lifestyle vs a strict diet with specific foods she has to eat.   Diet hx: sodas, candies, and high fat dinners consumed daily. Pt has Type 2 Diabetes. Pt checks CBG's 1-2 times a day. Fasting CBG's reportedly 110-120 mg/dL.   Per discussion, pt does use canned/convenience foods often. Pt does not add salt to food. Pt does not eat out frequently.   Education reviewed:  Heart Healthy/mediterranean diet Diabetes education (carb counting, fiber) Label reading Plate Method  Pt expressed understanding of the information reviewed.  Lab Results  Component Value Date   HGBA1C 7.5 (H) 07/03/2019    Wt Readings from Last 3 Encounters:  08/28/19 180 lb 9.6 oz (81.9 kg)  08/08/19 181 lb (82.1 kg)  08/01/19 183 lb (83 kg)    Nutrition Diagnosis ? Food-and nutrition-related knowledge deficit related to lack of exposure to information as related to diagnosis of: ? CVD ? Type 2 Diabetes  Nutrition Intervention ? Pt's individual nutrition plan reviewed with pt. ? Benefits of adopting Heart Healthy diet discussed    ? Continue client-centered nutrition education by RD, as part of interdisciplinary care.  Goal(s)  ? Pt to build a healthy plate including vegetables, fruits, whole grains, and low-fat dairy products in a heart healthy meal plan. ? Improved blood glucose control as evidenced by pt's A1c trending from 7.5 toward less than 7.0.  Plan:   Will provide client-centered nutrition education as part of interdisciplinary care  Monitor and  evaluate progress toward nutrition goal with team.   Andrey Campanile, MS, RDN, LDN

## 2019-09-06 ENCOUNTER — Ambulatory Visit (HOSPITAL_COMMUNITY): Payer: BC Managed Care – PPO

## 2019-09-09 ENCOUNTER — Ambulatory Visit (HOSPITAL_COMMUNITY): Payer: BC Managed Care – PPO

## 2019-09-10 NOTE — Progress Notes (Signed)
Cardiology Office Note    Date:  09/11/2019   ID:  Andrea Robertson, DOB May 15, 1961, MRN [id number]  PCP:  Lorenda Ishihara, MD  Cardiologist: Tobias Alexander, MD EPS: None  Chief Complaint  Patient presents with  . Follow-up    History of Present Illness:  Andrea Robertson is a 59 y.o. female with hx of CAD s/p NSTEMI 06/2019 DES to mLAD, minimal nonobstructive stenosis in the circumflex and RCA, echo LVEF 50 to 55% with grade 1 DD and trivial pericardial effusion, post infract pericarditis, AKI and valsartan held HTN, HLD, diabetes mellitus.  In ER 07/26/2019 for palpitations.  CTA negative for PE and monitor placed.   Patient was seen in our clinic 08/01/2019 but was still wearing the monitor no changes made that day.  Patient continued to complain of palpitations and record 8 events on the monitor and all were normal sinus rhythm.  Patient developed a rash from the monitor so it was stopped early.     In the emergency room 08/08/2019 with shortness of breath.  She had mild edema mild elevated BNP and mild heart failure-looking pattern on chest x-ray no EKG changes and troponins negative.  1 dose of IV Lasix and started on daily Lasix with recommendations for future echo.  Patient called in 08/11/2019 with elevated blood pressures and carvedilol was increased to 25 mg twice daily.  I saw the patient 08/28/2019 blood pressure was running high.  I started amlodipine 5 mg once daily.  She had been on 10 mg prior to her MI in addition to valsartan.  She also wanted Lasix stopped which I did.  Creatinine 1.68 08/28/2019.  Patient comes in for f/u. BP much better at home. Dyspnea on exertion if walking long distance in a hurry. Walking 15-20 min 3 times a week and started elliptical. Working with cardiac rehab.   Past Medical History:  Diagnosis Date  . AKI (acute kidney injury) (HCC)   . Arthritis   . Coronary artery disease involving native coronary artery of native heart  without angina pectoris 07/04/2019  . Depression   . Diabetes mellitus without complication (HCC)   . Essential hypertension 10/30/2018  . GERD (gastroesophageal reflux disease)   . Headache    sinus headaches  . Hyperlipidemia 07/04/2019  . PVC (premature ventricular contraction) 10/30/2018  . Shortness of breath   . Vertigo 10/30/2018    Past Surgical History:  Procedure Laterality Date  . ABDOMINAL HYSTERECTOMY    . CHOLECYSTECTOMY    . CORONARY STENT INTERVENTION N/A 07/03/2019   Procedure: CORONARY STENT INTERVENTION;  Surgeon: Tonny Bollman, MD;  Location: [facility] INVASIVE CV LAB;  Service: Cardiovascular;  Laterality: N/A;  . LEFT HEART CATH AND CORONARY ANGIOGRAPHY N/A 07/03/2019   Procedure: LEFT HEART CATH AND CORONARY ANGIOGRAPHY;  Surgeon: Tonny Bollman, MD;  Location: [facility] INVASIVE CV LAB;  Service: Cardiovascular;  Laterality: N/A;  . TOTAL KNEE ARTHROPLASTY Right 10/13/2015   Procedure: RIGHT TOTAL KNEE ARTHROPLASTY;  Surgeon: Ranee Gosselin, MD;  Location: [facility] ORS;  Service: Orthopedics;  Laterality: Right;    Current Medications: Current Meds  Medication Sig  . acetaminophen (TYLENOL) 500 MG tablet Take 1,000 mg by mouth every 6 (six) hours as needed for mild pain or headache.  Marland Kitchen amLODipine (NORVASC) 5 MG tablet Take 1 tablet (5 mg total) by mouth daily.  Marland Kitchen aspirin EC 81 MG EC tablet Take 1 tablet (81 mg total) by mouth daily.  Marland Kitchen atorvastatin (LIPITOR) 80 MG tablet Take  1 tablet (80 mg total) by mouth daily at 6 PM.  . buPROPion (WELLBUTRIN XL) 300 MG 24 hr tablet Take 300 mg by mouth daily.  . carvedilol (COREG) 25 MG tablet Take 1 tablet (25 mg total) by mouth 2 (two) times daily.  Marland Kitchen escitalopram (LEXAPRO) 10 MG tablet Take 10 mg by mouth daily.  Marland Kitchen FARXIGA 5 MG TABS tablet Take 5 mg by mouth daily.  . fluticasone (FLONASE) 50 MCG/ACT nasal spray Place 1 spray into both nostrils daily as needed for allergies or rhinitis.  Marland Kitchen meclizine (ANTIVERT) 25 MG tablet Take 1  tablet (25 mg total) by mouth 3 (three) times daily as needed for dizziness.  . Multiple Vitamin (MULTIVITAMIN WITH MINERALS) TABS tablet Take 1 tablet by mouth daily.  . nitroGLYCERIN (NITROSTAT) 0.4 MG SL tablet Place 1 tablet (0.4 mg total) under the tongue every 5 (five) minutes x 3 doses as needed for chest pain.  . ticagrelor (BRILINTA) 90 MG TABS tablet Take 1 tablet (90 mg total) by mouth 2 (two) times daily.     Allergies:   Dilaudid [hydromorphone hcl], Erythromycin, Penicillins, Percocet [oxycodone-acetaminophen], and Zithromax [azithromycin]   Social History   Socioeconomic History  . Marital status: Married    Spouse name: Not on file  . Number of children: Not on file  . Years of education: Not on file  . Highest education level: Not on file  Occupational History  . Not on file  Tobacco Use  . Smoking status: Never Smoker  . Smokeless tobacco: Never Used  Substance and Sexual Activity  . Alcohol use: Yes    Comment: Social drinker  . Drug use: No  . Sexual activity: Yes  Other Topics Concern  . Not on file  Social History Narrative  . Not on file   Social Determinants of Health   Financial Resource Strain:   . Difficulty of Paying Living Expenses: Not on file  Food Insecurity:   . Worried About Charity fundraiser in the Last Year: Not on file  . Ran Out of Food in the Last Year: Not on file  Transportation Needs:   . Lack of Transportation (Medical): Not on file  . Lack of Transportation (Non-Medical): Not on file  Physical Activity:   . Days of Exercise per Week: Not on file  . Minutes of Exercise per Session: Not on file  Stress:   . Feeling of Stress : Not on file  Social Connections:   . Frequency of Communication with Friends and Family: Not on file  . Frequency of Social Gatherings with Friends and Family: Not on file  . Attends Religious Services: Not on file  . Active Member of Clubs or Organizations: Not on file  . Attends Theatre manager Meetings: Not on file  . Marital Status: Not on file     Family History:  The patient's   family history includes CAD in her father.   ROS:   Please see the history of present illness.    ROS All other systems reviewed and are negative.   PHYSICAL EXAM:   VS:  BP 124/86   Pulse 78   Ht 5\' 3"  (1.6 m)   Wt 179 lb (81.2 kg)   SpO2 98%   BMI 31.71 kg/m   Physical Exam  GEN: Well nourished, well developed, in no acute distress  Neck: no JVD, carotid bruits, or masses Cardiac:RRR; S4,no murmurs, rubs,   Respiratory:  clear to auscultation bilaterally,  normal work of breathing GI: soft, nontender, nondistended, + BS Ext: without cyanosis, clubbing, or edema, Good distal pulses bilaterally Neuro:  Alert and Oriented x 3 Psych: euthymic mood, full affect  Wt Readings from Last 3 Encounters:  09/11/19 179 lb (81.2 kg)  08/28/19 180 lb 9.6 oz (81.9 kg)  08/08/19 181 lb (82.1 kg)      Studies/Labs Reviewed:   EKG:  EKG is not ordered today.    Recent Labs: 07/25/2019: TSH 1.360 08/08/2019: B Natriuretic Peptide 237.7; Hemoglobin 11.9; Platelets 358 08/28/2019: BUN 21; Creatinine, Ser 1.68; Potassium 3.9; Sodium 141   Lipid Panel    Component Value Date/Time   CHOL 197 07/04/2019 0912   TRIG 183 (H) 07/04/2019 0912   HDL 47 07/04/2019 0912   CHOLHDL 4.2 07/04/2019 0912   VLDL 37 07/04/2019 0912   LDLCALC 113 (H) 07/04/2019 0912    Additional studies/ records that were reviewed today include:  30-day monitor 08/26/2018 Sinus rhythm to sinus tachycardia with maximal heart rate 120 bpm, no arrhythmias, symptoms did not correlate with any arrhythmias.  Normal 30 days event monitor.  Continue current management. Cardiac catheterization 07/03/2019   Total occlusion of the mid-LAD, treated successfully with PCI using a 2.5x20 mm Synergy DES 2. Mild nonobstructive stenosis of the LCx and RCA 3. Mildly elevated LVEDP   Recommend: aggressive medical therapy, DAPT with  ASA and ticagrelor x 12 months without interruption   2D echo 11/11/2020IMPRESSIONS      1. Left ventricular ejection fraction, by visual estimation, is 50 to 55%. The left ventricle has normal function. Left ventricular septal wall thickness was mildly increased. There is no left ventricular hypertrophy.  2. Left ventricular diastolic parameters are consistent with Grade I diastolic dysfunction (impaired relaxation).  3. Hypokinesis of the mid to apical anteroseptum, apical anterior, and apical myocardium.  4. Global right ventricle has normal systolic function.The right ventricular size is normal. No increase in right ventricular wall thickness.  5. Left atrial size was normal.  6. Right atrial size was normal.  7. Trivial pericardial effusion is present.  8. The mitral valve is normal in structure. No evidence of mitral valve regurgitation. No evidence of mitral stenosis.  9. The tricuspid valve is normal in structure. Tricuspid valve regurgitation is trivial. 10. The aortic valve is tricuspid. Aortic valve regurgitation is trivial. No evidence of aortic valve sclerosis or stenosis. 11. The pulmonic valve was normal in structure. Pulmonic valve regurgitation is trivial. 12. Normal pulmonary artery systolic pressure. 13. The inferior vena cava is normal in size with greater than 50% respiratory variability, suggesting right atrial pressure of 3 mmHg.      ASSESSMENT:    1. Coronary artery disease involving native coronary artery of native heart without angina pectoris   2. Diastolic CHF, chronic (HCC)   3. AKI (acute kidney injury) (HCC)   4. Essential hypertension   5. Hyperlipidemia, unspecified hyperlipidemia type   6. Palpitations      PLAN:  In order of problems listed above:  CAD status post NSTEMI 06/2019 treated with DES to the mid LAD with minimal nonobstructive stenosis in the circumflex and RCA.  Normal LV function with trivial pericardial effusion on echo Post  infarct pericarditis. Working with cardiac rehab. Some DOE when walking fast. No CHF on exam. Could be due to deconditioning. She'll call us if it persists.  Continue aspirin Brilinta Lipitor and Coreg.  Follow-up with Dr. Delton See in 3 months.  CHF with elevated BNP to  37 mild edema and abnormal chest x-ray in ER 08/08/2019 no heart failure on exam.  Normal LV function on echo with grade 1 DD.  AKI after MI ARB held Crt 1.68 08/28/19 Lasix stopped.  Essential hypertension blood pressure much better controlled on higher dose amlodipine.  Hyperlipidemia on lipitor LDL 113 07/04/19 will need FLP next month  Palpitations-  monitor reviewed and shows NSR without arrhythmia    Medication Adjustments/Labs and Tests Ordered: Current medicines are reviewed at length with the patient today.  Concerns regarding medicines are outlined above.  Medication changes, Labs and Tests ordered today are listed in the Patient Instructions below. Patient Instructions  Medication Instructions:  Your physician recommends that you continue on your current medications as directed. Please refer to the Current Medication list given to you today.  If you need a refill on your cardiac medications before your next appointment, please call your pharmacy.   Lab work: None Ordered  If you have labs (blood work) drawn today and your tests are completely normal, you will receive your results only by: Marland Kitchen MyChart Message (if you have MyChart) OR . A paper copy in the mail If you have any lab test that is abnormal or we need to change your treatment, we will call you to review the results.  Testing/Procedures: None ordered  Follow-Up: Follow up with Dr. Delton See on 12/18/19 at 9:20 AM  Any Other Special Instructions Will Be Listed Below (If Applicable).       Signed, Jacolyn Reedy, PA-C  09/11/2019 11:22 AM    [facility] [address] [place], [place], [place]  [address] Phone: [phone]; Fax:  [phone]

## 2019-09-11 ENCOUNTER — Other Ambulatory Visit: Payer: Self-pay

## 2019-09-11 ENCOUNTER — Ambulatory Visit: Payer: BC Managed Care – PPO | Admitting: Physician Assistant

## 2019-09-11 ENCOUNTER — Telehealth: Payer: BC Managed Care – PPO | Admitting: Physician Assistant

## 2019-09-11 ENCOUNTER — Ambulatory Visit (HOSPITAL_COMMUNITY): Payer: BC Managed Care – PPO

## 2019-09-11 ENCOUNTER — Encounter: Payer: Self-pay | Admitting: Physician Assistant

## 2019-09-11 ENCOUNTER — Ambulatory Visit: Payer: BC Managed Care – PPO | Attending: Internal Medicine

## 2019-09-11 VITALS — BP 124/86 | HR 78 | Ht 63.0 in | Wt 179.0 lb

## 2019-09-11 DIAGNOSIS — I2510 Atherosclerotic heart disease of native coronary artery without angina pectoris: Secondary | ICD-10-CM

## 2019-09-11 DIAGNOSIS — Z23 Encounter for immunization: Secondary | ICD-10-CM | POA: Diagnosis not present

## 2019-09-11 DIAGNOSIS — I10 Essential (primary) hypertension: Secondary | ICD-10-CM | POA: Diagnosis not present

## 2019-09-11 DIAGNOSIS — I5032 Chronic diastolic (congestive) heart failure: Secondary | ICD-10-CM

## 2019-09-11 DIAGNOSIS — N179 Acute kidney failure, unspecified: Secondary | ICD-10-CM | POA: Diagnosis not present

## 2019-09-11 DIAGNOSIS — E785 Hyperlipidemia, unspecified: Secondary | ICD-10-CM

## 2019-09-11 DIAGNOSIS — R002 Palpitations: Secondary | ICD-10-CM

## 2019-09-11 NOTE — Progress Notes (Signed)
   Covid-19 Vaccination Clinic  Name:  Andrea Robertson    MRN: [id number] DOB: 02-Oct-1960  09/11/2019  [name] was observed post Covid-19 immunization for 15 minutes without incidence. She was provided with Vaccine Information Sheet and instruction to access the V-Safe system.   [name] was instructed to call 911 with any severe reactions post vaccine: Marland Kitchen Difficulty breathing  . Swelling of your face and throat  . A fast heartbeat  . A bad rash all over your body  . Dizziness and weakness    Immunizations Administered    Name Date Dose VIS Date Route   Pfizer COVID-19 Vaccine 09/11/2019  6:55 PM 0.3 mL 08/02/2019 Intramuscular   Manufacturer: [employer], [employer]   Lot: [id number]   NDC: [address]-8

## 2019-09-11 NOTE — Patient Instructions (Signed)
Medication Instructions:  Your physician recommends that you continue on your current medications as directed. Please refer to the Current Medication list given to you today.  If you need a refill on your cardiac medications before your next appointment, please call your pharmacy.   Lab work: None Ordered  If you have labs (blood work) drawn today and your tests are completely normal, you will receive your results only by: Marland Kitchen MyChart Message (if you have MyChart) OR . A paper copy in the mail If you have any lab test that is abnormal or we need to change your treatment, we will call you to review the results.  Testing/Procedures: None ordered  Follow-Up: Follow up with Dr. Delton See on 12/18/19 at 9:20 AM  Any Other Special Instructions Will Be Listed Below (If Applicable).

## 2019-09-13 ENCOUNTER — Ambulatory Visit (HOSPITAL_COMMUNITY): Payer: BC Managed Care – PPO

## 2019-09-16 ENCOUNTER — Ambulatory Visit (HOSPITAL_COMMUNITY): Payer: BC Managed Care – PPO

## 2019-09-16 ENCOUNTER — Telehealth: Payer: Self-pay | Admitting: Cardiology

## 2019-09-16 NOTE — Telephone Encounter (Signed)
   Primary Cardiologist: Tobias Alexander, MD  Chart reviewed as part of pre-operative protocol coverage. Simple dental extractions are considered low risk procedures per guidelines and generally do not require any specific cardiac clearance. It is also generally accepted that for simple extractions and dental cleanings, there is no need to interrupt blood thinner therapy. Due to recent stent placement, aspirin and ticagrelor should not be interrupted. Patient is OK to have dental work in setting of recent MI. She was stable at recent office visit on 09/11/19.   SBE prophylaxis is not required for the patient.  I will route this recommendation to the requesting party via Epic fax function and remove from pre-op pool.  Please call with questions.  Berton Bon, NP 09/16/2019, 2:34 PM

## 2019-09-16 NOTE — Telephone Encounter (Signed)
1. What dental office are you calling from? [facility]   2. What is your office phone number? [phone]  3. What is your fax number? [phone]  4. What type of procedure is the patient having performed?  General cleaning, Placement of bridge, No extraction   5. What date is procedure scheduled or is the patient there now?  (if the patient is at the dentist's office question goes to their cardiologist if he/she is in the office.  If not, question should go to the DOD).  09/17/19   6. What is your question (ex. Antibiotics prior to procedure, holding medication-we need to know how long dentist wants pt to hold med)?  If any medications need to be withheld before procedure, If there are any antibiotics needed prior to appointment, & if it is okay to perform procedure due to recent heart attack.

## 2019-09-17 ENCOUNTER — Other Ambulatory Visit: Payer: Self-pay

## 2019-09-17 ENCOUNTER — Encounter (HOSPITAL_COMMUNITY)
Admission: RE | Admit: 2019-09-17 | Discharge: 2019-09-17 | Disposition: A | Discharge status: Home / Self Care | Payer: Self-pay | Source: Ambulatory Visit | Attending: Cardiology | Admitting: Cardiology

## 2019-09-17 ENCOUNTER — Telehealth (HOSPITAL_COMMUNITY): Payer: Self-pay | Admitting: *Deleted

## 2019-09-17 NOTE — Telephone Encounter (Signed)
Spoke to [place] she is at the Dentist office. I called Charlen to check on her. Miles is exercising 3 days a week and documenting her vital signs in the [facility] APP. I told Mikaya that our in person cardiac rehab will be reopening in the upcoming weeks. Kamika says that she is interested in participating. "In person rehab will be better for me."Gladstone Lighter, RN,BSN 09/17/2019 11:13 AM

## 2019-09-18 ENCOUNTER — Ambulatory Visit (HOSPITAL_COMMUNITY): Payer: BC Managed Care – PPO

## 2019-09-19 ENCOUNTER — Inpatient Hospital Stay (HOSPITAL_COMMUNITY)
Admission: RE | Admit: 2019-09-19 | Discharge: 2019-09-19 | Disposition: A | Discharge status: Home / Self Care | Payer: BC Managed Care – PPO | Source: Ambulatory Visit

## 2019-09-19 ENCOUNTER — Other Ambulatory Visit: Payer: Self-pay

## 2019-09-19 NOTE — Progress Notes (Signed)
Nutrition Note: Virtual Visit  Biweekly f/u. Spoke with pt for virtual cardiac rehab. Pt is  exercising at home about 4-5 days/week on her elliptical at home or walking outside. Pt says she is reporting in app.  Diet recall reviewed. Pt has made appropriate diet changes for diabetes and heart health. Her fasting CBGs 110-116 mg/dl. Provided meatless meal options and how to incorporate beans and grains to make a balanced meal.  Nutrition goals reviewed. Will continue to monitor pt with biweekly phone calls for virtual cardiac rehab.   Andrey Campanile, MS, RDN, LDN

## 2019-09-20 ENCOUNTER — Ambulatory Visit (HOSPITAL_COMMUNITY): Payer: BC Managed Care – PPO

## 2019-09-23 ENCOUNTER — Ambulatory Visit (HOSPITAL_COMMUNITY): Payer: BC Managed Care – PPO

## 2019-09-25 ENCOUNTER — Ambulatory Visit (HOSPITAL_COMMUNITY): Payer: BC Managed Care – PPO

## 2019-09-27 ENCOUNTER — Ambulatory Visit (HOSPITAL_COMMUNITY): Payer: BC Managed Care – PPO

## 2019-09-30 ENCOUNTER — Ambulatory Visit (HOSPITAL_COMMUNITY): Payer: BC Managed Care – PPO

## 2019-10-02 ENCOUNTER — Ambulatory Visit (HOSPITAL_COMMUNITY): Payer: BC Managed Care – PPO

## 2019-10-02 ENCOUNTER — Ambulatory Visit: Payer: BC Managed Care – PPO | Attending: Internal Medicine

## 2019-10-02 DIAGNOSIS — Z23 Encounter for immunization: Secondary | ICD-10-CM | POA: Insufficient documentation

## 2019-10-02 NOTE — Progress Notes (Signed)
   Covid-19 Vaccination Clinic  Name:  Andrea Robertson    MRN: [id number] DOB: October 04, 1960  10/02/2019  [name] was observed post Covid-19 immunization for 30 minutes based on pre-vaccination screening without incidence. She was provided with Vaccine Information Sheet and instruction to access the V-Safe system.   [name] was instructed to call 911 with any severe reactions post vaccine: Marland Kitchen Difficulty breathing  . Swelling of your face and throat  . A fast heartbeat  . A bad rash all over your body  . Dizziness and weakness    Immunizations Administered    Name Date Dose VIS Date Route   Pfizer COVID-19 Vaccine 10/02/2019  9:53 AM 0.3 mL 08/02/2019 Intramuscular   Manufacturer: [employer], [employer]   Lot: [id number]   NDC: [address]-9

## 2019-10-03 ENCOUNTER — Encounter (HOSPITAL_COMMUNITY): Payer: BC Managed Care – PPO

## 2019-10-03 ENCOUNTER — Telehealth (HOSPITAL_COMMUNITY): Payer: Self-pay

## 2019-10-03 NOTE — Telephone Encounter (Signed)
Pt insurance is active and benefits verified through [employer] Co-pay 0, DED $100/$5.72 met, out of pocket $1,000/0 met, co-insurance 20%. no pre-authorization required, REF# [id number]  Will contact patient to see if she is interested in the Cardiac Rehab Program. If interested, patient will need to complete follow up appt. Once completed, patient will be contacted for scheduling upon review by the RN Navigator.

## 2019-10-04 ENCOUNTER — Ambulatory Visit (HOSPITAL_COMMUNITY): Payer: BC Managed Care – PPO

## 2019-10-07 ENCOUNTER — Encounter (HOSPITAL_COMMUNITY)
Admission: RE | Admit: 2019-10-07 | Discharge: 2019-10-07 | Disposition: A | Discharge status: Home / Self Care | Payer: BC Managed Care – PPO | Source: Ambulatory Visit | Attending: Cardiology | Admitting: Cardiology

## 2019-10-07 ENCOUNTER — Other Ambulatory Visit: Payer: Self-pay

## 2019-10-07 DIAGNOSIS — I214 Non-ST elevation (NSTEMI) myocardial infarction: Secondary | ICD-10-CM | POA: Insufficient documentation

## 2019-10-07 DIAGNOSIS — Z955 Presence of coronary angioplasty implant and graft: Secondary | ICD-10-CM | POA: Insufficient documentation

## 2019-10-07 NOTE — Progress Notes (Signed)
Cardiac Rehab Note:  Successful telephone encounter to Andrea Robertson to confirm Cardiac Rehab orientation appointment for 10/08/19 at 0930. Nursing assessment completed. Instructions for appointment provided. Patient screening for Covid-19 negative. Patient currently active on the [facility] virtual cardiac rehab app.  Judieth Mckown E. Suzie Portela RN, BSN [place]. [facility]  Cardiac and Pulmonary Rehabilitation [phone] Dial Direct: [phone]

## 2019-10-08 ENCOUNTER — Encounter (HOSPITAL_COMMUNITY)
Admission: RE | Admit: 2019-10-08 | Discharge: 2019-10-08 | Disposition: A | Discharge status: Home / Self Care | Payer: BC Managed Care – PPO | Source: Ambulatory Visit | Attending: Cardiology | Admitting: Cardiology

## 2019-10-08 ENCOUNTER — Encounter (HOSPITAL_COMMUNITY): Payer: Self-pay

## 2019-10-08 VITALS — BP 110/74 | HR 76 | Temp 96.6°F | Ht 61.75 in | Wt 179.9 lb

## 2019-10-08 DIAGNOSIS — Z955 Presence of coronary angioplasty implant and graft: Secondary | ICD-10-CM

## 2019-10-08 DIAGNOSIS — I214 Non-ST elevation (NSTEMI) myocardial infarction: Secondary | ICD-10-CM

## 2019-10-08 NOTE — Progress Notes (Signed)
Cardiac Rehab Med review note:  Cardiac Rehab Medication Review by a RN  Does the patient  feel that his/her medications are working for him/her?  yes  Has the patient been experiencing any side effects to the medications prescribed?  no  Does the patient measure his/her own blood pressure or blood glucose at home?  yes   Does the patient have any problems obtaining medications due to transportation or finances?   no  Understanding of regimen: excellent Understanding of indications: excellent Potential of compliance: excellent    RN comments: No medication adherence barriers identified.    Tina Griffiths 10/08/2019 10:38 AM

## 2019-10-08 NOTE — Progress Notes (Signed)
Cardiac Individual Treatment Plan  Patient Details  Name: Andrea Robertson MRN: [id number] Date of Birth: Mar 26, 1961 Referring Provider:     CARDIAC REHAB PHASE II ORIENTATION from 10/08/2019 in [facility] CARDIAC [facility]  Referring Provider  Tobias Alexander, MD      Initial Encounter Date:    CARDIAC REHAB PHASE II ORIENTATION from 10/08/2019 in [facility] CARDIAC REHAB  Date  10/08/19      Visit Diagnosis: NSTEMI (non-ST elevated myocardial infarction) [facility])  Status post coronary artery stent placement  Patient's Home Medications on Admission:  Current Outpatient Medications:  .  Accu-Chek FastClix Lancets MISC, USE TO TEST YOUR BLOOD SUGAR TWICE A DAY DX E11.21, Disp: , Rfl:  .  acetaminophen (TYLENOL) 500 MG tablet, Take 1,000 mg by mouth every 6 (six) hours as needed for mild pain or headache., Disp: , Rfl:  .  amLODipine (NORVASC) 5 MG tablet, Take 1 tablet (5 mg total) by mouth daily., Disp: 90 tablet, Rfl: 3 .  aspirin EC 81 MG EC tablet, Take 1 tablet (81 mg total) by mouth daily., Disp: 90 tablet, Rfl: 3 .  atorvastatin (LIPITOR) 80 MG tablet, Take 1 tablet (80 mg total) by mouth daily at 6 PM., Disp: 90 tablet, Rfl: 3 .  buPROPion (WELLBUTRIN XL) 300 MG 24 hr tablet, Take 300 mg by mouth daily., Disp: , Rfl:  .  carvedilol (COREG) 25 MG tablet, Take 1 tablet (25 mg total) by mouth 2 (two) times daily., Disp: 180 tablet, Rfl: 3 .  escitalopram (LEXAPRO) 10 MG tablet, Take 10 mg by mouth daily., Disp: , Rfl:  .  FARXIGA 5 MG TABS tablet, Take 5 mg by mouth daily., Disp: , Rfl:  .  fluticasone (FLONASE) 50 MCG/ACT nasal spray, Place 1 spray into both nostrils daily as needed for allergies or rhinitis., Disp: , Rfl:  .  Multiple Vitamin (MULTIVITAMIN WITH MINERALS) TABS tablet, Take 1 tablet by mouth daily., Disp: , Rfl:  .  ticagrelor (BRILINTA) 90 MG TABS tablet, Take 1 tablet (90 mg total) by mouth 2 (two) times daily., Disp: 180 tablet,  Rfl: 3 .  meclizine (ANTIVERT) 25 MG tablet, Take 1 tablet (25 mg total) by mouth 3 (three) times daily as needed for dizziness. (Patient not taking: Reported on 10/08/2019), Disp: 30 tablet, Rfl: 0 .  nitroGLYCERIN (NITROSTAT) 0.4 MG SL tablet, Place 1 tablet (0.4 mg total) under the tongue every 5 (five) minutes x 3 doses as needed for chest pain. (Patient not taking: Reported on 10/08/2019), Disp: 25 tablet, Rfl: 1  Past Medical History: Past Medical History:  Diagnosis Date  . AKI (acute kidney injury) (HCC)   . Arthritis   . Coronary artery disease involving native coronary artery of native heart without angina pectoris 07/04/2019  . Depression   . Diabetes mellitus without complication (HCC)   . Essential hypertension 10/30/2018  . GERD (gastroesophageal reflux disease)   . Headache    sinus headaches  . Hyperlipidemia 07/04/2019  . PVC (premature ventricular contraction) 10/30/2018  . Shortness of breath   . Vertigo 10/30/2018    Tobacco Use: Social History   Tobacco Use  Smoking Status Never Smoker  Smokeless Tobacco Never Used    Labs: Recent Review Flowsheet Data    Labs for ITP Cardiac and Pulmonary Rehab Latest Ref Rng & Units 07/03/2019 07/04/2019   Cholestrol 0 - 200 mg/dL - 409   LDLCALC 0 - 99 mg/dL - 811(B)   HDL >  40 mg/dL - 47   Trlycerides <314 mg/dL - 970(Y)   Hemoglobin O3Z 4.8 - 5.6 % 7.5(H) -      Capillary Blood Glucose: Lab Results  Component Value Date   GLUCAP 101 (H) 07/04/2019   GLUCAP 142 (H) 07/04/2019   GLUCAP 160 (H) 07/03/2019   GLUCAP 110 (H) 07/03/2019   GLUCAP 166 (H) 10/30/2018     Exercise Target Goals: Exercise Program Goal: Individual exercise prescription set using results from initial 6 min walk test and THRR while considering  patient's activity barriers and safety.   Exercise Prescription Goal: Initial exercise prescription builds to 30-45 minutes a day of aerobic activity, 2-3 days per week.  Home exercise guidelines  will be given to patient during program as part of exercise prescription that the participant will acknowledge.  Activity Barriers & Risk Stratification: Activity Barriers & Cardiac Risk Stratification - 10/08/19 0946      Activity Barriers & Cardiac Risk Stratification   Activity Barriers  Other (comment);Right Knee Replacement    Comments  Vertigo    Cardiac Risk Stratification  High       6 Minute Walk: 6 Minute Walk    Row Name 10/08/19 1004         6 Minute Walk   Phase  Initial     Distance  1329 feet     Walk Time  6 minutes     # of Rest Breaks  0     MPH  2.52     METS  3.31     RPE  11     Perceived Dyspnea   0     VO2 Peak  11.59     Symptoms  No     Resting HR  82 bpm     Resting BP  110/74     Resting Oxygen Saturation   100 %     Exercise Oxygen Saturation  during 6 min walk  98 %     Max Ex. HR  100 bpm     Max Ex. BP  128/72     2 Minute Post BP  122/70        Oxygen Initial Assessment:   Oxygen Re-Evaluation:   Oxygen Discharge (Final Oxygen Re-Evaluation):   Initial Exercise Prescription: Initial Exercise Prescription - 10/08/19 1100      Date of Initial Exercise RX and Referring Provider   Date  10/08/19    Referring Provider  Tobias Alexander, MD    Expected Discharge Date  12/06/19      Treadmill   MPH  2.5    Grade  0    Minutes  15    METs  2.91      NuStep   Level  3    SPM  85    Minutes  15    METs  2.9      Prescription Details   Frequency (times per week)  3    Duration  Progress to 30 minutes of continuous aerobic without signs/symptoms of physical distress      Intensity   THRR 40-80% of Max Heartrate  66-131    Ratings of Perceived Exertion  11-13    Perceived Dyspnea  0-4      Progression   Progression  Continue to progress workloads to maintain intensity without signs/symptoms of physical distress.      Resistance Training   Training Prescription  Yes    Weight  5 lbs.  Reps  10-15       Perform  Capillary Blood Glucose checks as needed.  Exercise Prescription Changes:   Exercise Comments:   Exercise Goals and Review:  Exercise Goals    Row Name 10/08/19 0946             Exercise Goals   Increase Physical Activity  Yes       Intervention  Provide advice, education, support and counseling about physical activity/exercise needs.;Develop an individualized exercise prescription for aerobic and resistive training based on initial evaluation findings, risk stratification, comorbidities and participant's personal goals.       Expected Outcomes  Short Term: Attend rehab on a regular basis to increase amount of physical activity.;Long Term: Exercising regularly at least 3-5 days a week.;Long Term: Add in home exercise to make exercise part of routine and to increase amount of physical activity.       Increase Strength and Stamina  Yes       Intervention  Provide advice, education, support and counseling about physical activity/exercise needs.;Develop an individualized exercise prescription for aerobic and resistive training based on initial evaluation findings, risk stratification, comorbidities and participant's personal goals.       Expected Outcomes  Short Term: Increase workloads from initial exercise prescription for resistance, speed, and METs.;Short Term: Perform resistance training exercises routinely during rehab and add in resistance training at home;Long Term: Improve cardiorespiratory fitness, muscular endurance and strength as measured by increased METs and functional capacity ( )       Able to understand and use rate of perceived exertion (RPE) scale  Yes       Intervention  Provide education and explanation on how to use RPE scale       Expected Outcomes  Short Term: Able to use RPE daily in rehab to express subjective intensity level;Long Term:  Able to use RPE to guide intensity level when exercising independently       Knowledge and understanding of Target Heart Rate  Range (THRR)  Yes       Intervention  Provide education and explanation of THRR including how the numbers were predicted and where they are located for reference       Expected Outcomes  Short Term: Able to state/look up THRR;Long Term: Able to use THRR to govern intensity when exercising independently;Short Term: Able to use daily as guideline for intensity in rehab       Able to check pulse independently  Yes       Intervention  Provide education and demonstration on how to check pulse in carotid and radial arteries.;Review the importance of being able to check your own pulse for safety during independent exercise       Expected Outcomes  Short Term: Able to explain why pulse checking is important during independent exercise;Long Term: Able to check pulse independently and accurately       Understanding of Exercise Prescription  Yes       Intervention  Provide education, explanation, and written materials on patient's individual exercise prescription       Expected Outcomes  Short Term: Able to explain program exercise prescription;Long Term: Able to explain home exercise prescription to exercise independently          Exercise Goals Re-Evaluation :   Discharge Exercise Prescription (Final Exercise Prescription Changes):   Nutrition:  Target Goals: Understanding of nutrition guidelines, daily intake of sodium 1500mg , cholesterol 200mg , calories 30% from fat and 7% or less from saturated fats,  daily to have 5 or more servings of fruits and vegetables.  Biometrics: Pre Biometrics - 10/08/19 0946      Pre Biometrics   Height  5' 1.75" (1.568 m)    Weight  81.6 kg    Waist Circumference  40.25 inches    Hip Circumference  40.5 inches    Waist to Hip Ratio  0.99 %    BMI (Calculated)  33.19    Triceps Skinfold  47 mm    % Body Fat  46.2 %    Grip Strength  30.5 kg    Flexibility  15 in    Single Leg Stand  30 seconds        Nutrition Therapy Plan and Nutrition  Goals:   Nutrition Assessments:   Nutrition Goals Re-Evaluation:   Nutrition Goals Re-Evaluation:   Nutrition Goals Discharge (Final Nutrition Goals Re-Evaluation):   Psychosocial: Target Goals: Acknowledge presence or absence of significant depression and/or stress, maximize coping skills, provide positive support system. Participant is able to verbalize types and ability to use techniques and skills needed for reducing stress and depression.  Initial Review & Psychosocial Screening: Initial Psych Review & Screening - 10/08/19 1039      Initial Review   Current issues with  History of Depression;Current Psychotropic Meds      Family Dynamics   Good Support System?  Yes    Comments  Mr. Lingenfelter admits she is surrounded by emotional support including her husband, 2 adult sons, sisters, church family, and friends. She has a positive attitude and outlook. She enjoys reading and uses this hobby as a healthy way to cope with any stressors that may occurr. No barriers to self health management or participation in CR identified.      Barriers   Psychosocial barriers to participate in program  There are no identifiable barriers or psychosocial needs.      Screening Interventions   Interventions  Encouraged to exercise       Quality of Life Scores: Quality of Life - 10/08/19 1103      Quality of Life   Select  Quality of Life      Quality of Life Scores   Health/Function Pre  27.53 %    Socioeconomic Pre  28.29 %    Psych/Spiritual Pre  25.71 %    Family Pre  27.6 %    GLOBAL Pre  27.32 %      Scores of 19 and below usually indicate a poorer quality of life in these areas.  A difference of  2-3 points is a clinically meaningful difference.  A difference of 2-3 points in the total score of the Quality of Life Index has been associated with significant improvement in overall quality of life, self-image, physical symptoms, and general health in studies assessing change in  quality of life.  PHQ-9: Recent Review Flowsheet Data    Depression screen [facility] 2/9 10/08/2019   Decreased Interest 0   Down, Depressed, Hopeless 0   PHQ - 2 Score 0     Interpretation of Total Score  Total Score Depression Severity:  1-4 = Minimal depression, 5-9 = Mild depression, 10-14 = Moderate depression, 15-19 = Moderately severe depression, 20-27 = Severe depression   Psychosocial Evaluation and Intervention:   Psychosocial Re-Evaluation:   Psychosocial Discharge (Final Psychosocial Re-Evaluation):   Vocational Rehabilitation: Provide vocational rehab assistance to qualifying candidates.   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 Participant will state understanding/return demonstration of topics presented.  Learning Barriers/Preferences: Learning Barriers/Preferences - 10/08/19 1042      Learning Barriers/Preferences   Learning Barriers  None       Education Topics: Count Your Pulse:  -Group instruction provided by verbal instruction, demonstration, patient participation and written materials to support subject.  Instructors address importance of being able to find your pulse and how to count your pulse when at home without a heart monitor.  Patients get hands on experience counting their pulse with staff help and individually.   Heart Attack, Angina, and Risk Factor Modification:  -Group instruction provided by verbal instruction, video, and written materials to support subject.  Instructors address signs and symptoms of angina and heart attacks.     Also discuss risk factors for heart disease and how to make changes to improve heart health risk factors.   Functional Fitness:  -Group instruction provided by verbal instruction, demonstration, patient participation, and written materials to support subject.  Instructors address safety measures for doing things around the house.  Discuss how to get up and down off the floor, how to pick things up properly, how to safely get out of a chair without assistance, and balance training.   Meditation and Mindfulness:  -Group instruction provided by verbal instruction, patient participation, and written materials to support subject.  Instructor addresses importance of mindfulness and meditation practice to help reduce stress and improve awareness.  Instructor also leads participants through a meditation exercise.    Stretching for Flexibility and Mobility:  -Group instruction provided by verbal instruction, patient participation, and written materials to support subject.  Instructors lead participants through series of stretches that are designed to increase flexibility thus improving mobility.  These stretches are additional exercise for major muscle groups that are typically performed during regular warm up and cool down.   Hands Only CPR:  -Group verbal, video, and participation provides a basic overview of AHA guidelines for community CPR. Role-play of emergencies allow participants the opportunity to practice calling for help and chest compression technique with discussion of AED use.   Hypertension: -Group verbal and written instruction that provides a basic overview of hypertension including the most recent diagnostic guidelines, risk factor reduction with self-care instructions and medication management.    Nutrition I class: Heart Healthy Eating:  -Group instruction provided by PowerPoint slides, verbal discussion, and written materials to support subject matter. The instructor gives an  explanation and review of the Therapeutic Lifestyle Changes diet recommendations, which includes a discussion on lipid goals, dietary fat, sodium, fiber, plant stanol/sterol esters, sugar, and the components of a well-balanced, healthy diet.   Nutrition II class: Lifestyle Skills:  -Group instruction provided by PowerPoint slides, verbal discussion, and written materials to support subject matter. The instructor gives an explanation and review of label reading, grocery shopping for heart health, heart healthy recipe modifications, and ways to make healthier choices when eating out.   Diabetes Question & Answer:  -Group instruction provided by PowerPoint slides, verbal discussion, and written materials to support subject matter. The instructor gives an explanation and review of diabetes  co-morbidities, pre- and post-prandial blood glucose goals, pre-exercise blood glucose goals, signs, symptoms, and treatment of hypoglycemia and hyperglycemia, and foot care basics.   Diabetes Blitz:  -Group instruction provided by PowerPoint slides, verbal discussion, and written materials to support subject matter. The instructor gives an explanation and review of the physiology behind type 1 and type 2 diabetes, diabetes medications and rational behind using different medications, pre- and post-prandial blood glucose recommendations and Hemoglobin A1c goals, diabetes diet, and exercise including blood glucose guidelines for exercising safely.    Portion Distortion:  -Group instruction provided by PowerPoint slides, verbal discussion, written materials, and food models to support subject matter. The instructor gives an explanation of serving size versus portion size, changes in portions sizes over the last 20 years, and what consists of a serving from each food group.   Stress Management:  -Group instruction provided by verbal instruction, video, and written materials to support subject matter.  Instructors  review role of stress in heart disease and how to cope with stress positively.     Exercising on Your Own:  -Group instruction provided by verbal instruction, power point, and written materials to support subject.  Instructors discuss benefits of exercise, components of exercise, frequency and intensity of exercise, and end points for exercise.  Also discuss use of nitroglycerin and activating EMS.  Review options of places to exercise outside of rehab.  Review guidelines for sex with heart disease.   Cardiac Drugs I:  -Group instruction provided by verbal instruction and written materials to support subject.  Instructor reviews cardiac drug classes: antiplatelets, anticoagulants, beta blockers, and statins.  Instructor discusses reasons, side effects, and lifestyle considerations for each drug class.   Cardiac Drugs II:  -Group instruction provided by verbal instruction and written materials to support subject.  Instructor reviews cardiac drug classes: angiotensin converting enzyme inhibitors (ACE-I), angiotensin II receptor blockers (ARBs), nitrates, and calcium channel blockers.  Instructor discusses reasons, side effects, and lifestyle considerations for each drug class.   Anatomy and Physiology of the Circulatory System:  Group verbal and written instruction and models provide basic cardiac anatomy and physiology, with the coronary electrical and arterial systems. Review of: AMI, Angina, Valve disease, Heart Failure, Peripheral Artery Disease, Cardiac Arrhythmia, Pacemakers, and the ICD.   Other Education:  -Group or individual verbal, written, or video instructions that support the educational goals of the cardiac rehab program.   Holiday Eating Survival Tips:  -Group instruction provided by PowerPoint slides, verbal discussion, and written materials to support subject matter. The instructor gives patients tips, tricks, and techniques to help them not only survive but enjoy the holidays  despite the onslaught of food that accompanies the holidays.   Knowledge Questionnaire Score: Knowledge Questionnaire Score - 10/08/19 1104      Knowledge Questionnaire Score   Pre Score  21/24       Core Components/Risk Factors/Patient Goals at Admission: Personal Goals and Risk Factors at Admission - 10/08/19 1104      Core Components/Risk Factors/Patient Goals on Admission    Weight Management  Yes;Obesity;Weight Loss    Intervention  Weight Management/Obesity: Establish reasonable short term and long term weight goals.;Obesity: Provide education and appropriate resources to help participant work on and attain dietary goals.    Admit Weight  179 lb 14.3 oz (81.6 kg)    Goal Weight: Short Term  173 lb (78.5 kg)    Goal Weight: Long Term  155 lb (70.3 kg)    Expected Outcomes  Short Term: Continue to assess and modify interventions until short term weight is achieved;Long Term: Adherence to nutrition and physical activity/exercise program aimed toward attainment of established weight goal;Weight Loss: Understanding of general recommendations for a balanced deficit meal plan, which promotes 1-2 lb weight loss per week and includes a negative energy balance of [phone] kcal/d;Understanding of distribution of calorie intake throughout the day with the consumption of 4-5 meals/snacks;Understanding recommendations for meals to include 15-35% energy as protein, 25-35% energy from fat, 35-60% energy from carbohydrates, less than 200mg  of dietary cholesterol, 20-35 gm of total fiber daily    Diabetes  Yes    Intervention  Provide education about signs/symptoms and action to take for hypo/hyperglycemia.;Provide education about proper nutrition, including hydration, and aerobic/resistive exercise prescription along with prescribed medications to achieve blood glucose in normal ranges: Fasting glucose 65-99 mg/dL    Expected Outcomes  Short Term: Participant verbalizes understanding of the  signs/symptoms and immediate care of hyper/hypoglycemia, proper foot care and importance of medication, aerobic/resistive exercise and nutrition plan for blood glucose control.;Long Term: Attainment of HbA1C < 7%.    Hypertension  Yes    Intervention  Provide education on lifestyle modifcations including regular physical activity/exercise, weight management, moderate sodium restriction and increased consumption of fresh fruit, vegetables, and low fat dairy, alcohol moderation, and smoking cessation.;Monitor prescription use compliance.    Expected Outcomes  Short Term: Continued assessment and intervention until BP is < 140/70mm HG in hypertensive participants. < 130/9mm HG in hypertensive participants with diabetes, heart failure or chronic kidney disease.;Long Term: Maintenance of blood pressure at goal levels.    Lipids  Yes    Intervention  Provide education and support for participant on nutrition & aerobic/resistive exercise along with prescribed medications to achieve LDL 70mg , HDL >40mg .    Expected Outcomes  Short Term: Participant states understanding of desired cholesterol values and is compliant with medications prescribed. Participant is following exercise prescription and nutrition guidelines.;Long Term: Cholesterol controlled with medications as prescribed, with individualized exercise RX and with personalized nutrition plan. Value goals: LDL < 70mg , HDL > 40 mg.       Core Components/Risk Factors/Patient Goals Review:    Core Components/Risk Factors/Patient Goals at Discharge (Final Review):    ITP Comments: ITP Comments    Row Name 10/08/19 1002           ITP Comments  Dr. , Medical Director [facility]          Comments: Patient attended orientation on 10/08/2019 to review rules and guidelines for program.  Completed 6 minute walk test, Intitial ITP, and exercise prescription.  VSS. Telemetry-NSR with inverted T wave.  Asymptomatic. Safety measures and  social distancing in place per CDC guidelines. Patient currently active on the [facility] app and will utilize in addition to her in-person exercise.

## 2019-10-14 ENCOUNTER — Encounter (HOSPITAL_COMMUNITY)
Admission: RE | Admit: 2019-10-14 | Discharge: 2019-10-14 | Disposition: A | Discharge status: Home / Self Care | Payer: BC Managed Care – PPO | Source: Ambulatory Visit | Attending: Cardiology | Admitting: Cardiology

## 2019-10-14 ENCOUNTER — Other Ambulatory Visit: Payer: Self-pay

## 2019-10-14 DIAGNOSIS — I214 Non-ST elevation (NSTEMI) myocardial infarction: Secondary | ICD-10-CM

## 2019-10-14 DIAGNOSIS — Z955 Presence of coronary angioplasty implant and graft: Secondary | ICD-10-CM

## 2019-10-14 LAB — GLUCOSE, CAPILLARY
Glucose-Capillary: 114 mg/dL — ABNORMAL HIGH (ref 70–99)
Glucose-Capillary: 145 mg/dL — ABNORMAL HIGH (ref 70–99)

## 2019-10-14 NOTE — Progress Notes (Signed)
Daily Session Note  Patient Details  Name: Andrea Robertson MRN: [id number] Date of Birth: [birth year] Referring Provider:     CARDIAC REHAB PHASE II ORIENTATION from 10/08/2019 in [place]  Referring Provider  Ena Dawley, MD      Encounter Date: 10/14/2019  Check In:   Capillary Blood Glucose: No results found for this or any previous visit (from the past 24 hour(s)).    Social History   Tobacco Use  Smoking Status Never Smoker  Smokeless Tobacco Never Used    Goals Met:  Exercise tolerated well No report of cardiac concerns or symptoms Strength training completed today  Goals Unmet:  Not Applicable  Comments: Pt started cardiac rehab today.  Pt tolerated light exercise without difficulty. VSS, telemetry-NSR, asymptomatic.  Medication list reconciled. Pt denies barriers to medicaiton compliance.  PSYCHOSOCIAL ASSESSMENT:  PHQ-. Pt exhibits positive coping skills, hopeful outlook with supportive family. No psychosocial needs identified at this time, no psychosocial interventions necessary.  Pt oriented to exercise equipment and routine. Understanding verbalized.   Dr. Fransico Him is Medical Director for Cardiac Rehab at [facility].

## 2019-10-16 ENCOUNTER — Encounter (HOSPITAL_COMMUNITY)
Admission: RE | Admit: 2019-10-16 | Discharge: 2019-10-16 | Disposition: A | Discharge status: Home / Self Care | Payer: BC Managed Care – PPO | Source: Ambulatory Visit | Attending: Cardiology | Admitting: Cardiology

## 2019-10-16 ENCOUNTER — Other Ambulatory Visit: Payer: Self-pay

## 2019-10-16 DIAGNOSIS — Z955 Presence of coronary angioplasty implant and graft: Secondary | ICD-10-CM

## 2019-10-16 DIAGNOSIS — I214 Non-ST elevation (NSTEMI) myocardial infarction: Secondary | ICD-10-CM

## 2019-10-16 LAB — GLUCOSE, CAPILLARY: Glucose-Capillary: 150 mg/dL — ABNORMAL HIGH (ref 70–99)

## 2019-10-18 ENCOUNTER — Encounter (HOSPITAL_COMMUNITY): Payer: BC Managed Care – PPO

## 2019-10-21 ENCOUNTER — Other Ambulatory Visit: Payer: Self-pay

## 2019-10-21 ENCOUNTER — Encounter (HOSPITAL_COMMUNITY)
Admission: RE | Admit: 2019-10-21 | Discharge: 2019-10-21 | Disposition: A | Discharge status: Home / Self Care | Payer: BC Managed Care – PPO | Source: Ambulatory Visit | Attending: Cardiology | Admitting: Cardiology

## 2019-10-21 DIAGNOSIS — I214 Non-ST elevation (NSTEMI) myocardial infarction: Secondary | ICD-10-CM | POA: Diagnosis not present

## 2019-10-21 DIAGNOSIS — Z955 Presence of coronary angioplasty implant and graft: Secondary | ICD-10-CM | POA: Insufficient documentation

## 2019-10-22 DIAGNOSIS — M25562 Pain in left knee: Secondary | ICD-10-CM | POA: Diagnosis not present

## 2019-10-23 ENCOUNTER — Encounter (HOSPITAL_COMMUNITY)
Admission: RE | Admit: 2019-10-23 | Discharge: 2019-10-23 | Disposition: A | Discharge status: Home / Self Care | Payer: BC Managed Care – PPO | Source: Ambulatory Visit | Attending: Cardiology | Admitting: Cardiology

## 2019-10-23 ENCOUNTER — Other Ambulatory Visit: Payer: Self-pay

## 2019-10-23 DIAGNOSIS — Z955 Presence of coronary angioplasty implant and graft: Secondary | ICD-10-CM

## 2019-10-23 DIAGNOSIS — I214 Non-ST elevation (NSTEMI) myocardial infarction: Secondary | ICD-10-CM

## 2019-10-23 LAB — GLUCOSE, CAPILLARY: Glucose-Capillary: 146 mg/dL — ABNORMAL HIGH (ref 70–99)

## 2019-10-25 ENCOUNTER — Encounter (HOSPITAL_COMMUNITY)
Admission: RE | Admit: 2019-10-25 | Discharge: 2019-10-25 | Disposition: A | Discharge status: Home / Self Care | Payer: BC Managed Care – PPO | Source: Ambulatory Visit | Attending: Cardiology | Admitting: Cardiology

## 2019-10-25 ENCOUNTER — Other Ambulatory Visit: Payer: Self-pay

## 2019-10-25 DIAGNOSIS — I214 Non-ST elevation (NSTEMI) myocardial infarction: Secondary | ICD-10-CM

## 2019-10-25 DIAGNOSIS — Z955 Presence of coronary angioplasty implant and graft: Secondary | ICD-10-CM

## 2019-10-28 ENCOUNTER — Other Ambulatory Visit: Payer: Self-pay

## 2019-10-28 ENCOUNTER — Encounter (HOSPITAL_COMMUNITY)
Admission: RE | Admit: 2019-10-28 | Discharge: 2019-10-28 | Disposition: A | Discharge status: Home / Self Care | Payer: BC Managed Care – PPO | Source: Ambulatory Visit | Attending: Cardiology | Admitting: Cardiology

## 2019-10-28 DIAGNOSIS — I214 Non-ST elevation (NSTEMI) myocardial infarction: Secondary | ICD-10-CM | POA: Diagnosis not present

## 2019-10-28 DIAGNOSIS — Z955 Presence of coronary angioplasty implant and graft: Secondary | ICD-10-CM | POA: Diagnosis not present

## 2019-10-28 NOTE — Progress Notes (Signed)
Andrea Robertson 59 y.o. female Nutrition Note  Spoke with pt during exercise. She continues to follow a heart healthy diet.  Diet recall: Breakfast: egg, whole wheat toast Lunch: salad with tuna Dinner: Meat, potatoes, veggies Snacks: fruit, cheerios with fairlife milk, Fairlife protein shakes. Fasting CBGs: 100-115 mg/dl  Pt has questions about snack alternatives. We discussed heart healthy sweet snacks for evening snacks. She typically has triscuits and laughing cow cheese but wants something sweeter. Recommended low fat cottage cheese with fruit, greek yogurt with low sugar granola, graham cracker with reduced fat cream cheese and honey.   Plan:   Pt to attend nutrition classes ? Nutrition I ? Nutrition II ? Portion Distortion   Will provide client-centered nutrition education as part of interdisciplinary care  Monitor and evaluate progress toward nutrition goal with team.   Andrey Campanile, MS, RDN, LDN

## 2019-10-30 ENCOUNTER — Other Ambulatory Visit: Payer: Self-pay

## 2019-10-30 ENCOUNTER — Encounter (HOSPITAL_COMMUNITY)
Admission: RE | Admit: 2019-10-30 | Discharge: 2019-10-30 | Disposition: A | Discharge status: Home / Self Care | Payer: BC Managed Care – PPO | Source: Ambulatory Visit | Attending: Cardiology | Admitting: Cardiology

## 2019-10-30 DIAGNOSIS — I214 Non-ST elevation (NSTEMI) myocardial infarction: Secondary | ICD-10-CM | POA: Diagnosis not present

## 2019-10-30 DIAGNOSIS — Z955 Presence of coronary angioplasty implant and graft: Secondary | ICD-10-CM

## 2019-10-31 NOTE — Progress Notes (Signed)
Cardiac Individual Treatment Plan  Patient Details  Name: Andrea Robertson MRN: [id number] Date of Birth: 1960-11-28 Referring Provider:     CARDIAC REHAB PHASE II ORIENTATION from 10/08/2019 in [place]  Referring Provider  Ena Dawley, MD      Initial Encounter Date:    CARDIAC REHAB PHASE II ORIENTATION from 10/08/2019 in [place]  Date  10/08/19      Visit Diagnosis: NSTEMI (non-ST elevated myocardial infarction) [facility])  Status post coronary artery stent placement  Patient's Home Medications on Admission:  Current Outpatient Medications:  .  Accu-Chek FastClix Lancets MISC, USE TO TEST YOUR BLOOD SUGAR TWICE A DAY DX E11.21, Disp: , Rfl:  .  acetaminophen (TYLENOL) 500 MG tablet, Take 1,000 mg by mouth every 6 (six) hours as needed for mild pain or headache., Disp: , Rfl:  .  amLODipine (NORVASC) 5 MG tablet, Take 1 tablet (5 mg total) by mouth daily., Disp: 90 tablet, Rfl: 3 .  aspirin EC 81 MG EC tablet, Take 1 tablet (81 mg total) by mouth daily., Disp: 90 tablet, Rfl: 3 .  atorvastatin (LIPITOR) 80 MG tablet, Take 1 tablet (80 mg total) by mouth daily at 6 PM., Disp: 90 tablet, Rfl: 3 .  buPROPion (WELLBUTRIN XL) 300 MG 24 hr tablet, Take 300 mg by mouth daily., Disp: , Rfl:  .  carvedilol (COREG) 25 MG tablet, Take 1 tablet (25 mg total) by mouth 2 (two) times daily., Disp: 180 tablet, Rfl: 3 .  escitalopram (LEXAPRO) 10 MG tablet, Take 10 mg by mouth daily., Disp: , Rfl:  .  FARXIGA 5 MG TABS tablet, Take 5 mg by mouth daily., Disp: , Rfl:  .  fluticasone (FLONASE) 50 MCG/ACT nasal spray, Place 1 spray into both nostrils daily as needed for allergies or rhinitis., Disp: , Rfl:  .  meclizine (ANTIVERT) 25 MG tablet, Take 1 tablet (25 mg total) by mouth 3 (three) times daily as needed for dizziness. (Patient not taking: Reported on 10/08/2019), Disp: 30 tablet, Rfl: 0 .  Multiple Vitamin (MULTIVITAMIN  WITH MINERALS) TABS tablet, Take 1 tablet by mouth daily., Disp: , Rfl:  .  nitroGLYCERIN (NITROSTAT) 0.4 MG SL tablet, Place 1 tablet (0.4 mg total) under the tongue every 5 (five) minutes x 3 doses as needed for chest pain. (Patient not taking: Reported on 10/08/2019), Disp: 25 tablet, Rfl: 1 .  ticagrelor (BRILINTA) 90 MG TABS tablet, Take 1 tablet (90 mg total) by mouth 2 (two) times daily., Disp: 180 tablet, Rfl: 3  Past Medical History: Past Medical History:  Diagnosis Date  . AKI (acute kidney injury) ([place])   . Arthritis   . Coronary artery disease involving native coronary artery of native heart without angina pectoris 07/04/2019  . Depression   . Diabetes mellitus without complication ([place])   . Essential hypertension 10/30/2018  . GERD (gastroesophageal reflux disease)   . Headache    sinus headaches  . Hyperlipidemia 07/04/2019  . PVC (premature ventricular contraction) 10/30/2018  . Shortness of breath   . Vertigo 10/30/2018    Tobacco Use: Social History   Tobacco Use  Smoking Status Never Smoker  Smokeless Tobacco Never Used    Labs: Recent Review Flowsheet Data    Labs for ITP Cardiac and Pulmonary Rehab Latest Ref Rng & Units 07/03/2019 07/04/2019   Cholestrol 0 - 200 mg/dL - 197   LDLCALC 0 - 99 mg/dL - 113(H)   HDL >  40 mg/dL - 47   Trlycerides <150 mg/dL - 183(H)   Hemoglobin A1c 4.8 - 5.6 % 7.5(H) -      Capillary Blood Glucose: Lab Results  Component Value Date   GLUCAP 146 (H) 10/23/2019   GLUCAP 150 (H) 10/16/2019   GLUCAP 145 (H) 10/14/2019   GLUCAP 114 (H) 10/14/2019   GLUCAP 101 (H) 07/04/2019     Exercise Target Goals: Exercise Program Goal: Individual exercise prescription set using results from initial 6 min walk test and THRR while considering  patient's activity barriers and safety.   Exercise Prescription Goal: Initial exercise prescription builds to 30-45 minutes a day of aerobic activity, 2-3 days per week.  Home exercise guidelines  will be given to patient during program as part of exercise prescription that the participant will acknowledge.  Activity Barriers & Risk Stratification: Activity Barriers & Cardiac Risk Stratification - 10/08/19 0946      Activity Barriers & Cardiac Risk Stratification   Activity Barriers  Other (comment);Right Knee Replacement    Comments  Vertigo    Cardiac Risk Stratification  High       6 Minute Walk: 6 Minute Walk    Row Name 10/08/19 1004         6 Minute Walk   Phase  Initial     Distance  1329 feet     Walk Time  6 minutes     # of Rest Breaks  0     MPH  2.52     METS  3.31     RPE  11     Perceived Dyspnea   0     VO2 Peak  11.59     Symptoms  No     Resting HR  82 bpm     Resting BP  110/74     Resting Oxygen Saturation   100 %     Exercise Oxygen Saturation  during 6 min walk  98 %     Max Ex. HR  100 bpm     Max Ex. BP  128/72     2 Minute Post BP  122/70        Oxygen Initial Assessment:   Oxygen Re-Evaluation:   Oxygen Discharge (Final Oxygen Re-Evaluation):   Initial Exercise Prescription: Initial Exercise Prescription - 10/08/19 1100      Date of Initial Exercise RX and Referring Provider   Date  10/08/19    Referring Provider  Ena Dawley, MD    Expected Discharge Date  12/06/19      Treadmill   MPH  2.5    Grade  0    Minutes  15    METs  2.91      NuStep   Level  3    SPM  85    Minutes  15    METs  2.9      Prescription Details   Frequency (times per week)  3    Duration  Progress to 30 minutes of continuous aerobic without signs/symptoms of physical distress      Intensity   THRR 40-80% of Max Heartrate  66-131    Ratings of Perceived Exertion  11-13    Perceived Dyspnea  0-4      Progression   Progression  Continue to progress workloads to maintain intensity without signs/symptoms of physical distress.      Resistance Training   Training Prescription  Yes    Weight  5 lbs.  Reps  10-15       Perform  Capillary Blood Glucose checks as needed.  Exercise Prescription Changes: Exercise Prescription Changes    Row Name 10/14/19 1300 10/28/19 1100           Response to Exercise   Blood Pressure (Admit)  130/82  128/70      Blood Pressure (Exercise)  140/74  136/78      Blood Pressure (Exit)  122/78  112/64      Heart Rate (Admit)  82 bpm  83 bpm      Heart Rate (Exercise)  115 bpm  102 bpm      Heart Rate (Exit)  81 bpm  76 bpm      Rating of Perceived Exertion (Exercise)  13  13      Symptoms  None  None      Comments  Pt first day of exercise.   --      Duration  Continue with 30 min of aerobic exercise without signs/symptoms of physical distress.  Continue with 30 min of aerobic exercise without signs/symptoms of physical distress.      Intensity  THRR unchanged  THRR unchanged        Progression   Progression  Continue to progress workloads to maintain intensity without signs/symptoms of physical distress.  Continue to progress workloads to maintain intensity without signs/symptoms of physical distress.      Average METs  2.5  2.1        Resistance Training   Training Prescription  Yes  Yes      Weight  5 lbs.  3 lbs.       Reps  10-15  10-15      Time  10 Minutes  10 Minutes        Interval Training   Interval Training  No  No        Treadmill   MPH  2.1  2.5      Grade  0  0      Minutes  15  15      METs  2.91  2.91        NuStep   Level  3  3      SPM  85  85      Minutes  15  15      METs  2.1  1.3         Exercise Comments: Exercise Comments    Row Name 10/14/19 1311 10/30/19 1011         Exercise Comments  Pt first day of exercise. Pt tolerated exercise well.  Reviewed METs and goals with Pt. Pt is progressing well.         Exercise Goals and Review: Exercise Goals    Row Name 10/08/19 0946             Exercise Goals   Increase Physical Activity  Yes       Intervention  Provide advice, education, support and counseling about physical  activity/exercise needs.;Develop an individualized exercise prescription for aerobic and resistive training based on initial evaluation findings, risk stratification, comorbidities and participant's personal goals.       Expected Outcomes  Short Term: Attend rehab on a regular basis to increase amount of physical activity.;Long Term: Exercising regularly at least 3-5 days a week.;Long Term: Add in home exercise to make exercise part of routine and to increase amount of physical activity.  Increase Strength and Stamina  Yes       Intervention  Provide advice, education, support and counseling about physical activity/exercise needs.;Develop an individualized exercise prescription for aerobic and resistive training based on initial evaluation findings, risk stratification, comorbidities and participant's personal goals.       Expected Outcomes  Short Term: Increase workloads from initial exercise prescription for resistance, speed, and METs.;Short Term: Perform resistance training exercises routinely during rehab and add in resistance training at home;Long Term: Improve cardiorespiratory fitness, muscular endurance and strength as measured by increased METs and functional capacity (6MWT)       Able to understand and use rate of perceived exertion (RPE) scale  Yes       Intervention  Provide education and explanation on how to use RPE scale       Expected Outcomes  Short Term: Able to use RPE daily in rehab to express subjective intensity level;Long Term:  Able to use RPE to guide intensity level when exercising independently       Knowledge and understanding of Target Heart Rate Range (THRR)  Yes       Intervention  Provide education and explanation of THRR including how the numbers were predicted and where they are located for reference       Expected Outcomes  Short Term: Able to state/look up THRR;Long Term: Able to use THRR to govern intensity when exercising independently;Short Term: Able to use  daily as guideline for intensity in rehab       Able to check pulse independently  Yes       Intervention  Provide education and demonstration on how to check pulse in carotid and radial arteries.;Review the importance of being able to check your own pulse for safety during independent exercise       Expected Outcomes  Short Term: Able to explain why pulse checking is important during independent exercise;Long Term: Able to check pulse independently and accurately       Understanding of Exercise Prescription  Yes       Intervention  Provide education, explanation, and written materials on patient's individual exercise prescription       Expected Outcomes  Short Term: Able to explain program exercise prescription;Long Term: Able to explain home exercise prescription to exercise independently          Exercise Goals Re-Evaluation : Exercise Goals Re-Evaluation    [place] Name 10/14/19 1309 10/30/19 1010           Exercise Goal Re-Evaluation   Exercise Goals Review  Increase Physical Activity;Increase Strength and Stamina;Able to understand and use rate of perceived exertion (RPE) scale;Knowledge and understanding of Target Heart Rate Range (THRR);Understanding of Exercise Prescription  Increase Physical Activity;Increase Strength and Stamina;Able to understand and use rate of perceived exertion (RPE) scale;Knowledge and understanding of Target Heart Rate Range (THRR);Able to check pulse independently;Understanding of Exercise Prescription      Comments  Pt first day of exercise in CR program. Pt tolerated exercise well. Pt understands THRR, RPE scale, and exercise Rx.  Reviewed METs and goals with Pt. Pt is progressing well and has a MET average of 2.6. Pt stated she is walking at home and using an elliptical for 30-45 minutes 2-3 days per week in addition to CR program.      Expected Outcomes  Will continue to monitor and progress Pt as tolerated.  Will continue to monitor and progress Pt as  tolerated.  Discharge Exercise Prescription (Final Exercise Prescription Changes): Exercise Prescription Changes - 10/28/19 1100      Response to Exercise   Blood Pressure (Admit)  128/70    Blood Pressure (Exercise)  136/78    Blood Pressure (Exit)  112/64    Heart Rate (Admit)  83 bpm    Heart Rate (Exercise)  102 bpm    Heart Rate (Exit)  76 bpm    Rating of Perceived Exertion (Exercise)  13    Symptoms  None    Duration  Continue with 30 min of aerobic exercise without signs/symptoms of physical distress.    Intensity  THRR unchanged      Progression   Progression  Continue to progress workloads to maintain intensity without signs/symptoms of physical distress.    Average METs  2.1      Resistance Training   Training Prescription  Yes    Weight  3 lbs.     Reps  10-15    Time  10 Minutes      Interval Training   Interval Training  No      Treadmill   MPH  2.5    Grade  0    Minutes  15    METs  2.91      NuStep   Level  3    SPM  85    Minutes  15    METs  1.3       Nutrition:  Target Goals: Understanding of nutrition guidelines, daily intake of sodium <159m, cholesterol <2082m calories 30% from fat and 7% or less from saturated fats, daily to have 5 or more servings of fruits and vegetables.  Biometrics: Pre Biometrics - 10/08/19 0946      Pre Biometrics   Height  5' 1.75" (1.568 m)    Weight  81.6 kg    Waist Circumference  40.25 inches    Hip Circumference  40.5 inches    Waist to Hip Ratio  0.99 %    BMI (Calculated)  33.19    Triceps Skinfold  47 mm    % Body Fat  46.2 %    Grip Strength  30.5 kg    Flexibility  15 in    Single Leg Stand  30 seconds        Nutrition Therapy Plan and Nutrition Goals: Nutrition Therapy & Goals - 10/15/19 1445      Nutrition Therapy   Diet  Heart healthy/carb modified      Personal Nutrition Goals   Nutrition Goal  Improved blood glucose control as evidenced by pt's A1c trending from 7.5  toward less than 7.0.    Personal Goal #2  Pt to build a healthy plate including vegetables, fruits, whole grains, and low-fat dairy products in a heart healthy meal plan.      Intervention Plan   Intervention  Nutrition handout(s) given to patient.;Prescribe, educate and counsel regarding individualized specific dietary modifications aiming towards targeted core components such as weight, hypertension, lipid management, diabetes, heart failure and other comorbidities.    Expected Outcomes  Short Term Goal: A plan has been developed with personal nutrition goals set during dietitian appointment.;Long Term Goal: Adherence to prescribed nutrition plan.       Nutrition Assessments: Nutrition Assessments - 10/15/19 1446      MEDFICTS Scores   Pre Score  35       Nutrition Goals Re-Evaluation: Nutrition Goals Re-Evaluation    [place] 10/15/19 1446  Goals   Current Weight  179 lb (81.2 kg)       Nutrition Goal  Improved blood glucose control as evidenced by pt's A1c trending from 7.5 toward less than 7.0.         Personal Goal #2 Re-Evaluation   Personal Goal #2  Pt to build a healthy plate including vegetables, fruits, whole grains, and low-fat dairy products in a heart healthy meal plan.          Nutrition Goals Re-Evaluation: Nutrition Goals Re-Evaluation    [place] Name 10/15/19 1446             Goals   Current Weight  179 lb (81.2 kg)       Nutrition Goal  Improved blood glucose control as evidenced by pt's A1c trending from 7.5 toward less than 7.0.         Personal Goal #2 Re-Evaluation   Personal Goal #2  Pt to build a healthy plate including vegetables, fruits, whole grains, and low-fat dairy products in a heart healthy meal plan.          Nutrition Goals Discharge (Final Nutrition Goals Re-Evaluation): Nutrition Goals Re-Evaluation - 10/15/19 1446      Goals   Current Weight  179 lb (81.2 kg)    Nutrition Goal  Improved blood glucose control as  evidenced by pt's A1c trending from 7.5 toward less than 7.0.      Personal Goal #2 Re-Evaluation   Personal Goal #2  Pt to build a healthy plate including vegetables, fruits, whole grains, and low-fat dairy products in a heart healthy meal plan.       Psychosocial: Target Goals: Acknowledge presence or absence of significant depression and/or stress, maximize coping skills, provide positive support system. Participant is able to verbalize types and ability to use techniques and skills needed for reducing stress and depression.  Initial Review & Psychosocial Screening: Initial Psych Review & Screening - 10/08/19 1039      Initial Review   Current issues with  History of Depression;Current Psychotropic Meds      Family Dynamics   Good Support System?  Yes    Comments  Mr. Mccoy admits she is surrounded by emotional support including her husband, 2 adult sons, sisters, church family, and friends. She has a positive attitude and outlook. She enjoys reading and uses this hobby as a healthy way to cope with any stressors that may occurr. No barriers to self health management or participation in CR identified.      Barriers   Psychosocial barriers to participate in program  There are no identifiable barriers or psychosocial needs.      Screening Interventions   Interventions  Encouraged to exercise       Quality of Life Scores: Quality of Life - 10/08/19 1103      Quality of Life   Select  Quality of Life      Quality of Life Scores   Health/Function Pre  27.53 %    Socioeconomic Pre  28.29 %    Psych/Spiritual Pre  25.71 %    Family Pre  27.6 %    GLOBAL Pre  27.32 %      Scores of 19 and below usually indicate a poorer quality of life in these areas.  A difference of  2-3 points is a clinically meaningful difference.  A difference of 2-3 points in the total score of the Quality of Life Index has been associated with significant improvement  in overall quality of life,  self-image, physical symptoms, and general health in studies assessing change in quality of life.  PHQ-9: Recent Review Flowsheet Data    Depression screen [facility] 2/9 10/08/2019   Decreased Interest 0   Down, Depressed, Hopeless 0   PHQ - 2 Score 0     Interpretation of Total Score  Total Score Depression Severity:  1-4 = Minimal depression, 5-9 = Mild depression, 10-14 = Moderate depression, 15-19 = Moderately severe depression, 20-27 = Severe depression   Psychosocial Evaluation and Intervention: Psychosocial Evaluation - 10/14/19 1014      Psychosocial Evaluation & Interventions   Interventions  Encouraged to exercise with the program and follow exercise prescription    Comments  Ms. Swenor is very excited about participating in CR. She has a positive outlook and attitude and looking forward to bettering her health. She has a very strong support system and enjoys traveling and reading as a way to relieve stressors that may occurr.    Expected Outcomes  Ms. Caponigro will continue to have a positive outlook and attitude.       Psychosocial Re-Evaluation: Psychosocial Re-Evaluation    [place] Name 10/28/19 [phone]             Psychosocial Re-Evaluation   Current issues with  None Identified       Comments  No psychosocial needs identified.  No interventios necessary.       Expected Outcomes  Munira will maintain a positive outlook with good coping skills.       Interventions  Encouraged to attend Cardiac Rehabilitation for the exercise       Continue Psychosocial Services   No Follow up required          Psychosocial Discharge (Final Psychosocial Re-Evaluation): Psychosocial Re-Evaluation - 10/28/19 7824      Psychosocial Re-Evaluation   Current issues with  None Identified    Comments  No psychosocial needs identified.  No interventios necessary.    Expected Outcomes  Addalyn will maintain a positive outlook with good coping skills.    Interventions  Encouraged to attend Cardiac  Rehabilitation for the exercise    Continue Psychosocial Services   No Follow up required       Vocational Rehabilitation: Provide vocational rehab assistance to qualifying candidates.   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 Participant will state understanding/return demonstration of topics presented.  Learning Barriers/Preferences: Learning Barriers/Preferences - 10/08/19 1042      Learning Barriers/Preferences   Learning Barriers  None       Education Topics: Count Your Pulse:  -Group instruction provided by verbal instruction, demonstration, patient participation and written materials to support subject.  Instructors address importance of being able to find your pulse and how to count your pulse when at home without a heart monitor.  Patients get hands on experience counting their pulse with staff help and individually.   Heart Attack, Angina, and Risk Factor Modification:  -Group instruction provided by verbal instruction, video, and written materials to support subject.  Instructors address signs  and symptoms of angina and heart attacks.    Also discuss risk factors for heart disease and how to make changes to improve heart health risk factors.   Functional Fitness:  -Group instruction provided by verbal instruction, demonstration, patient participation, and written materials to support subject.  Instructors address safety measures for doing things around the house.  Discuss how to get up and down off the floor, how to pick things up properly, how to safely  get out of a chair without assistance, and balance training.   Meditation and Mindfulness:  -Group instruction provided by verbal instruction, patient participation, and written materials to support subject.  Instructor addresses importance of mindfulness and meditation practice to help reduce stress and improve awareness.  Instructor also leads participants through a meditation exercise.    Stretching for Flexibility and Mobility:  -Group instruction provided by verbal instruction, patient participation, and written materials to support subject.  Instructors lead participants through series of stretches that are designed to increase flexibility thus improving mobility.  These stretches are additional exercise for major muscle groups that are typically performed during regular warm up and cool down.   Hands Only CPR:  -Group verbal, video, and participation provides a basic overview of AHA guidelines for community CPR. Role-play of emergencies allow participants the opportunity to practice calling for help and chest compression technique with discussion of AED use.   Hypertension: -Group verbal and written instruction that provides a basic overview of hypertension including the most recent diagnostic guidelines, risk factor reduction with self-care instructions and medication management.    Nutrition I class: Heart Healthy Eating:  -Group instruction provided by PowerPoint slides, verbal discussion, and written materials to support subject matter. The instructor gives an explanation and review of the Therapeutic Lifestyle Changes diet recommendations, which includes a discussion on lipid goals, dietary fat, sodium, fiber, plant stanol/sterol esters, sugar, and the components of a well-balanced, healthy diet.   Nutrition II class: Lifestyle Skills:  -Group instruction provided by PowerPoint slides, verbal discussion, and written materials to support subject matter. The instructor gives an  explanation and review of label reading, grocery shopping for heart health, heart healthy recipe modifications, and ways to make healthier choices when eating out.   Diabetes Question & Answer:  -Group instruction provided by PowerPoint slides, verbal discussion, and written materials to support subject matter. The instructor gives an explanation and review of diabetes co-morbidities, pre- and post-prandial blood glucose goals, pre-exercise blood glucose goals, signs, symptoms, and treatment of hypoglycemia and hyperglycemia, and foot care basics.   Diabetes Blitz:  -Group instruction provided by PowerPoint slides, verbal discussion, and written materials to support subject matter. The instructor gives an explanation and review of the physiology behind type 1 and type 2 diabetes, diabetes medications and rational behind using different medications, pre- and post-prandial blood glucose recommendations and Hemoglobin A1c goals, diabetes diet, and exercise including blood glucose guidelines for exercising safely.    Portion Distortion:  -Group instruction provided by PowerPoint slides, verbal discussion, written materials, and food models to support subject matter. The instructor gives an explanation of serving size versus portion size, changes in portions sizes over the last 20 years, and what consists of a serving from each food group.   Stress Management:  -Group instruction provided by verbal instruction, video, and written materials to support subject matter.  Instructors review role of stress in heart disease and how to cope with stress positively.     Exercising on Your Own:  -Group  instruction provided by verbal instruction, power point, and written materials to support subject.  Instructors discuss benefits of exercise, components of exercise, frequency and intensity of exercise, and end points for exercise.  Also discuss use of nitroglycerin and activating EMS.  Review options of places to  exercise outside of rehab.  Review guidelines for sex with heart disease.   Cardiac Drugs I:  -Group instruction provided by verbal instruction and written materials to support subject.  Instructor reviews cardiac drug classes: antiplatelets, anticoagulants, beta blockers, and statins.  Instructor discusses reasons, side effects, and lifestyle considerations for each drug class.   Cardiac Drugs II:  -Group instruction provided by verbal instruction and written materials to support subject.  Instructor reviews cardiac drug classes: angiotensin converting enzyme inhibitors (ACE-I), angiotensin II receptor blockers (ARBs), nitrates, and calcium channel blockers.  Instructor discusses reasons, side effects, and lifestyle considerations for each drug class.   Anatomy and Physiology of the Circulatory System:  Group verbal and written instruction and models provide basic cardiac anatomy and physiology, with the coronary electrical and arterial systems. Review of: AMI, Angina, Valve disease, Heart Failure, Peripheral Artery Disease, Cardiac Arrhythmia, Pacemakers, and the ICD.   Other Education:  -Group or individual verbal, written, or video instructions that support the educational goals of the cardiac rehab program.   Holiday Eating Survival Tips:  -Group instruction provided by PowerPoint slides, verbal discussion, and written materials to support subject matter. The instructor gives patients tips, tricks, and techniques to help them not only survive but enjoy the holidays despite the onslaught of food that accompanies the holidays.   Knowledge Questionnaire Score: Knowledge Questionnaire Score - 10/08/19 1104      Knowledge Questionnaire Score   Pre Score  21/24       Core Components/Risk Factors/Patient Goals at Admission: Personal Goals and Risk Factors at Admission - 10/08/19 1104      Core Components/Risk Factors/Patient Goals on Admission    Weight Management  Yes;Obesity;Weight  Loss    Intervention  Weight Management/Obesity: Establish reasonable short term and long term weight goals.;Obesity: Provide education and appropriate resources to help participant work on and attain dietary goals.    Admit Weight  179 lb 14.3 oz (81.6 kg)    Goal Weight: Short Term  173 lb (78.5 kg)    Goal Weight: Long Term  155 lb (70.3 kg)    Expected Outcomes  Short Term: Continue to assess and modify interventions until short term weight is achieved;Long Term: Adherence to nutrition and physical activity/exercise program aimed toward attainment of established weight goal;Weight Loss: Understanding of general recommendations for a balanced deficit meal plan, which promotes 1-2 lb weight loss per week and includes a negative energy balance of [phone] kcal/d;Understanding of distribution of calorie intake throughout the day with the consumption of 4-5 meals/snacks;Understanding recommendations for meals to include 15-35% energy as protein, 25-35% energy from fat, 35-60% energy from carbohydrates, less than 254m of dietary cholesterol, 20-35 gm of total fiber daily    Diabetes  Yes    Intervention  Provide education about signs/symptoms and action to take for hypo/hyperglycemia.;Provide education about proper nutrition, including hydration, and aerobic/resistive exercise prescription along with prescribed medications to achieve blood glucose in normal ranges: Fasting glucose 65-99 mg/dL    Expected Outcomes  Short Term: Participant verbalizes understanding of the signs/symptoms and immediate care of hyper/hypoglycemia, proper foot care and importance of medication, aerobic/resistive exercise and nutrition plan for blood glucose control.;Long Term: Attainment of HbA1C < 7%.  Hypertension  Yes    Intervention  Provide education on lifestyle modifcations including regular physical activity/exercise, weight management, moderate sodium restriction and increased consumption of fresh fruit, vegetables, and  low fat dairy, alcohol moderation, and smoking cessation.;Monitor prescription use compliance.    Expected Outcomes  Short Term: Continued assessment and intervention until BP is < 140/39m HG in hypertensive participants. < 130/876mHG in hypertensive participants with diabetes, heart failure or chronic kidney disease.;Long Term: Maintenance of blood pressure at goal levels.    Lipids  Yes    Intervention  Provide education and support for participant on nutrition & aerobic/resistive exercise along with prescribed medications to achieve LDL <7010mHDL >48m57m  Expected Outcomes  Short Term: Participant states understanding of desired cholesterol values and is compliant with medications prescribed. Participant is following exercise prescription and nutrition guidelines.;Long Term: Cholesterol controlled with medications as prescribed, with individualized exercise RX and with personalized nutrition plan. Value goals: LDL < 70mg33mL > 40 mg.       Core Components/Risk Factors/Patient Goals Review:  Goals and Risk Factor Review    Row Name 10/14/19 1014 10/28/19 0758           Core Components/Risk Factors/Patient Goals Review   Personal Goals Review  Weight Management/Obesity;Lipids;Diabetes;Hypertension  Weight Management/Obesity;Lipids;Diabetes;Hypertension      Review  Patient with multiple CAD riskfactors and eager to participate in CR for riskfactor modifications.  Patient with multiple CAD risk factors wiling to participate in CR.  BrendKeringtoninues to tolerate exercise well.      Expected Outcomes  Patient will continue to participate in CR and follow her CR plan of care/ exercise prescription at home  BrendKirat continue to participate in CR exercise to reduce her risk of CV disease.         Core Components/Risk Factors/Patient Goals at Discharge (Final Review):  Goals and Risk Factor Review - 10/28/19 0758      Core Components/Risk Factors/Patient Goals Review   Personal Goals  Review  Weight Management/Obesity;Lipids;Diabetes;Hypertension    Review  Patient with multiple CAD risk factors wiling to participate in CR.  BrendKalieghinues to tolerate exercise well.    Expected Outcomes  BrendLynnlee continue to participate in CR exercise to reduce her risk of CV disease.       ITP Comments: ITP Comments    Row Name 10/08/19 1002 10/14/19 1104 10/28/19 0755       ITP Comments  Dr. TraciFransico Him[facility]  Ms. Weissmann completed her first exercise session today in CR and tolerated well. VSS. Patient denied complaints.  30 Day ITP Review.  BrendSheannainues to tolerate exercise well.  VSS.        Comments: See ITP Comments.

## 2019-11-01 ENCOUNTER — Other Ambulatory Visit: Payer: Self-pay

## 2019-11-01 ENCOUNTER — Encounter (HOSPITAL_COMMUNITY)
Admission: RE | Admit: 2019-11-01 | Discharge: 2019-11-01 | Disposition: A | Discharge status: Home / Self Care | Payer: BC Managed Care – PPO | Source: Ambulatory Visit | Attending: Cardiology | Admitting: Cardiology

## 2019-11-01 DIAGNOSIS — I214 Non-ST elevation (NSTEMI) myocardial infarction: Secondary | ICD-10-CM | POA: Diagnosis not present

## 2019-11-01 DIAGNOSIS — Z955 Presence of coronary angioplasty implant and graft: Secondary | ICD-10-CM | POA: Diagnosis not present

## 2019-11-01 NOTE — Progress Notes (Signed)
Discharge Progress Report  Patient Details  Name: Andrea Robertson MRN: [id number] Date of Birth: [birth year] Referring Provider:     [place] from 10/08/2019 in [place]  Referring Provider  Ena Dawley, MD       Number of Visits: 8  Reason for Discharge:  Early Exit:  Back to work  Smoking History:  Social History   Tobacco Use  Smoking Status Never Smoker  Smokeless Tobacco Never Used    Diagnosis:  NSTEMI (non-ST elevated myocardial infarction) ([place])  Status post coronary artery stent placement  ADL [facility]:   Initial Exercise Prescription: Initial Exercise Prescription - 10/08/19 1100      Date of Initial Exercise RX and Referring Provider   Date  10/08/19    Referring Provider  Ena Dawley, MD    Expected Discharge Date  12/06/19      Treadmill   MPH  2.5    Grade  0    Minutes  15    METs  2.91      NuStep   Level  3    SPM  85    Minutes  15    METs  2.9      Prescription Details   Frequency (times per week)  3    Duration  Progress to 30 minutes of continuous aerobic without signs/symptoms of physical distress      Intensity   THRR 40-80% of Max Heartrate  66-131    Ratings of Perceived Exertion  11-13    Perceived Dyspnea  0-4      Progression   Progression  Continue to progress workloads to maintain intensity without signs/symptoms of physical distress.      Resistance Training   Training Prescription  Yes    Weight  5 lbs.    Reps  10-15       Discharge Exercise Prescription (Final Exercise Prescription Changes): Exercise Prescription Changes - 11/01/19 1100      Response to Exercise   Blood Pressure (Admit)  122/84    Blood Pressure (Exercise)  138/70    Blood Pressure (Exit)  118/70    Heart Rate (Admit)  86 bpm    Heart Rate (Exercise)  108 bpm    Heart Rate (Exit)  86 bpm    Rating of Perceived Exertion (Exercise)  13    Symptoms  None    Comments  Pt  last day of exercise.     Duration  Continue with 30 min of aerobic exercise without signs/symptoms of physical distress.    Intensity  THRR unchanged      Progression   Progression  Continue to progress workloads to maintain intensity without signs/symptoms of physical distress.    Average METs  3      Resistance Training   Training Prescription  Yes    Weight  3 lbs.     Reps  10-15    Time  10 Minutes      Interval Training   Interval Training  No      Treadmill   MPH  2.5    Grade  0    Minutes  15    METs  2.91      NuStep   Level  4    SPM  85    Minutes  15    METs  3.2       Functional Capacity: 6 Minute Walk    Row Name  10/08/19 1004         6 Minute Walk   Phase  Initial     Distance  1329 feet     Walk Time  6 minutes     # of Rest Breaks  0     MPH  2.52     METS  3.31     RPE  11     Perceived Dyspnea   0     VO2 Peak  11.59     Symptoms  No     Resting HR  82 bpm     Resting BP  110/74     Resting Oxygen Saturation   100 %     Exercise Oxygen Saturation  during 6 min walk  98 %     Max Ex. HR  100 bpm     Max Ex. BP  128/72     2 Minute Post BP  122/70        Psychological, QOL, Others - Outcomes: PHQ 2/9: Depression screen PHQ 2/9 10/08/2019  Decreased Interest 0  Down, Depressed, Hopeless 0  PHQ - 2 Score 0    Quality of Life: Quality of Life - 10/08/19 1103      Quality of Life   Select  Quality of Life      Quality of Life Scores   Health/Function Pre  27.53 %    Socioeconomic Pre  28.29 %    Psych/Spiritual Pre  25.71 %    Family Pre  27.6 %    GLOBAL Pre  27.32 %       Personal Goals: Goals established at orientation with interventions provided to work toward goal. Personal Goals and Risk Factors at Admission - 10/08/19 1104      Core Components/Risk Factors/Patient Goals on Admission    Weight Management  Yes;Obesity;Weight Loss    Intervention  Weight Management/Obesity: Establish reasonable short term and  long term weight goals.;Obesity: Provide education and appropriate resources to help participant work on and attain dietary goals.    Admit Weight  179 lb 14.3 oz (81.6 kg)    Goal Weight: Short Term  173 lb (78.5 kg)    Goal Weight: Long Term  155 lb (70.3 kg)    Expected Outcomes  Short Term: Continue to assess and modify interventions until short term weight is achieved;Long Term: Adherence to nutrition and physical activity/exercise program aimed toward attainment of established weight goal;Weight Loss: Understanding of general recommendations for a balanced deficit meal plan, which promotes 1-2 lb weight loss per week and includes a negative energy balance of [phone] kcal/d;Understanding of distribution of calorie intake throughout the day with the consumption of 4-5 meals/snacks;Understanding recommendations for meals to include 15-35% energy as protein, 25-35% energy from fat, 35-60% energy from carbohydrates, less than 250m of dietary cholesterol, 20-35 gm of total fiber daily    Diabetes  Yes    Intervention  Provide education about signs/symptoms and action to take for hypo/hyperglycemia.;Provide education about proper nutrition, including hydration, and aerobic/resistive exercise prescription along with prescribed medications to achieve blood glucose in normal ranges: Fasting glucose 65-99 mg/dL    Expected Outcomes  Short Term: Participant verbalizes understanding of the signs/symptoms and immediate care of hyper/hypoglycemia, proper foot care and importance of medication, aerobic/resistive exercise and nutrition plan for blood glucose control.;Long Term: Attainment of HbA1C < 7%.    Hypertension  Yes    Intervention  Provide education on lifestyle modifcations including regular physical activity/exercise, weight  management, moderate sodium restriction and increased consumption of fresh fruit, vegetables, and low fat dairy, alcohol moderation, and smoking cessation.;Monitor prescription use  compliance.    Expected Outcomes  Short Term: Continued assessment and intervention until BP is < 140/77m HG in hypertensive participants. < 130/873mHG in hypertensive participants with diabetes, heart failure or chronic kidney disease.;Long Term: Maintenance of blood pressure at goal levels.    Lipids  Yes    Intervention  Provide education and support for participant on nutrition & aerobic/resistive exercise along with prescribed medications to achieve LDL <7073mHDL >73m69m  Expected Outcomes  Short Term: Participant states understanding of desired cholesterol values and is compliant with medications prescribed. Participant is following exercise prescription and nutrition guidelines.;Long Term: Cholesterol controlled with medications as prescribed, with individualized exercise RX and with personalized nutrition plan. Value goals: LDL < 70mg48mL > 40 mg.        Personal Goals Discharge: Goals and Risk Factor Review    Row Name 10/14/19 1014 10/28/19 0758 11/01/19 1456         Core Components/Risk Factors/Patient Goals Review   Personal Goals Review  Weight Management/Obesity;Lipids;Diabetes;Hypertension  Weight Management/Obesity;Lipids;Diabetes;Hypertension  Weight Management/Obesity;Lipids;Diabetes;Hypertension     Review  Patient with multiple CAD riskfactors and eager to participate in CR for riskfactor modifications.  Patient with multiple CAD risk factors wiling to participate in CR.  BrendChabelyinues to tolerate exercise well.  BrendTyreecompleted the CR program with 8 sessions. She is leaving early to return to work.  She tolerated exercise well.     Expected Outcomes  Patient will continue to participate in CR and follow her CR plan of care/ exercise prescription at home  BrendGenna continue to participate in CR exercise to reduce her risk of CV disease.  BrendLucero continue to participate in exercise to reduce her risk of CV disease.  She plans to exercise at home by walking,  using the ellipitical, participating with exercise videos.        Exercise Goals and Review: Exercise Goals    Row Name 10/08/19 0946             Exercise Goals   Increase Physical Activity  Yes       Intervention  Provide advice, education, support and counseling about physical activity/exercise needs.;Develop an individualized exercise prescription for aerobic and resistive training based on initial evaluation findings, risk stratification, comorbidities and participant's personal goals.       Expected Outcomes  Short Term: Attend rehab on a regular basis to increase amount of physical activity.;Long Term: Exercising regularly at least 3-5 days a week.;Long Term: Add in home exercise to make exercise part of routine and to increase amount of physical activity.       Increase Strength and Stamina  Yes       Intervention  Provide advice, education, support and counseling about physical activity/exercise needs.;Develop an individualized exercise prescription for aerobic and resistive training based on initial evaluation findings, risk stratification, comorbidities and participant's personal goals.       Expected Outcomes  Short Term: Increase workloads from initial exercise prescription for resistance, speed, and METs.;Short Term: Perform resistance training exercises routinely during rehab and add in resistance training at home;Long Term: Improve cardiorespiratory fitness, muscular endurance and strength as measured by increased METs and functional capacity (6MWT)       Able to understand and use rate of perceived exertion (RPE) scale  Yes  Intervention  Provide education and explanation on how to use RPE scale       Expected Outcomes  Short Term: Able to use RPE daily in rehab to express subjective intensity level;Long Term:  Able to use RPE to guide intensity level when exercising independently       Knowledge and understanding of Target Heart Rate Range (THRR)  Yes       Intervention   Provide education and explanation of THRR including how the numbers were predicted and where they are located for reference       Expected Outcomes  Short Term: Able to state/look up THRR;Long Term: Able to use THRR to govern intensity when exercising independently;Short Term: Able to use daily as guideline for intensity in rehab       Able to check pulse independently  Yes       Intervention  Provide education and demonstration on how to check pulse in carotid and radial arteries.;Review the importance of being able to check your own pulse for safety during independent exercise       Expected Outcomes  Short Term: Able to explain why pulse checking is important during independent exercise;Long Term: Able to check pulse independently and accurately       Understanding of Exercise Prescription  Yes       Intervention  Provide education, explanation, and written materials on patient's individual exercise prescription       Expected Outcomes  Short Term: Able to explain program exercise prescription;Long Term: Able to explain home exercise prescription to exercise independently          Exercise Goals Re-Evaluation: Exercise Goals Re-Evaluation    Row Name 10/14/19 1309 10/30/19 1010 11/01/19 1145         Exercise Goal Re-Evaluation   Exercise Goals Review  Increase Physical Activity;Increase Strength and Stamina;Able to understand and use rate of perceived exertion (RPE) scale;Knowledge and understanding of Target Heart Rate Range (THRR);Understanding of Exercise Prescription  Increase Physical Activity;Increase Strength and Stamina;Able to understand and use rate of perceived exertion (RPE) scale;Knowledge and understanding of Target Heart Rate Range (THRR);Able to check pulse independently;Understanding of Exercise Prescription  Increase Physical Activity;Able to understand and use rate of perceived exertion (RPE) scale;Increase Strength and Stamina;Knowledge and understanding of Target Heart Rate  Range (THRR);Able to check pulse independently;Understanding of Exercise Prescription     Comments  Pt first day of exercise in CR program. Pt tolerated exercise well. Pt understands THRR, RPE scale, and exercise Rx.  Reviewed METs and goals with Pt. Pt is progressing well and has a MET average of 2.6. Pt stated she is walking at home and using an elliptical for 30-45 minutes 2-3 days per week in addition to CR program.  Pt last day of exercise in CR program. Pt stopped the program early due to returning to work. Pt completed 8 sessions and progressed well while she was here. Pt stated she plans to continue exercising at home by walking and using her elliptical 5-7 days per week for 30-45 minutes. Pt is enrolled in Virtual CR where she will continue to log her exercise sessions daily.     Expected Outcomes  Will continue to monitor and progress Pt as tolerated.  Will continue to monitor and progress Pt as tolerated.  Pt will continue exercising at home and use Virtual CR program.        Nutrition & Weight - Outcomes: Pre Biometrics - 10/08/19 0946      Pre  Biometrics   Height  5' 1.75" (1.568 m)    Weight  81.6 kg    Waist Circumference  40.25 inches    Hip Circumference  40.5 inches    Waist to Hip Ratio  0.99 %    BMI (Calculated)  33.19    Triceps Skinfold  47 mm    % Body Fat  46.2 %    Grip Strength  30.5 kg    Flexibility  15 in    Single Leg Stand  30 seconds        Nutrition: Nutrition Therapy & Goals - 10/15/19 1445      Nutrition Therapy   Diet  Heart healthy/carb modified      Personal Nutrition Goals   Nutrition Goal  Improved blood glucose control as evidenced by pt's A1c trending from 7.5 toward less than 7.0.    Personal Goal #2  Pt to build a healthy plate including vegetables, fruits, whole grains, and low-fat dairy products in a heart healthy meal plan.      Intervention Plan   Intervention  Nutrition handout(s) given to patient.;Prescribe, educate and counsel  regarding individualized specific dietary modifications aiming towards targeted core components such as weight, hypertension, lipid management, diabetes, heart failure and other comorbidities.    Expected Outcomes  Short Term Goal: A plan has been developed with personal nutrition goals set during dietitian appointment.;Long Term Goal: Adherence to prescribed nutrition plan.       Nutrition Discharge: Nutrition Assessments - 10/15/19 1446      MEDFICTS Scores   Pre Score  35       Education Questionnaire Score: Knowledge Questionnaire Score - 10/08/19 1104      Knowledge Questionnaire Score   Pre Score  21/24       Goals reviewed with patient; copy given to patient.

## 2019-11-04 ENCOUNTER — Encounter (HOSPITAL_COMMUNITY): Payer: BC Managed Care – PPO

## 2019-11-06 ENCOUNTER — Encounter (HOSPITAL_COMMUNITY): Payer: BC Managed Care – PPO

## 2019-11-08 ENCOUNTER — Encounter (HOSPITAL_COMMUNITY): Payer: BC Managed Care – PPO

## 2019-11-11 ENCOUNTER — Encounter (HOSPITAL_COMMUNITY): Payer: BC Managed Care – PPO

## 2019-11-13 ENCOUNTER — Encounter (HOSPITAL_COMMUNITY): Payer: BC Managed Care – PPO

## 2019-11-15 ENCOUNTER — Encounter (HOSPITAL_COMMUNITY): Payer: BC Managed Care – PPO

## 2019-11-18 ENCOUNTER — Encounter (HOSPITAL_COMMUNITY): Payer: BC Managed Care – PPO

## 2019-11-20 ENCOUNTER — Encounter (HOSPITAL_COMMUNITY): Payer: BC Managed Care – PPO

## 2019-11-22 ENCOUNTER — Encounter (HOSPITAL_COMMUNITY): Payer: BC Managed Care – PPO

## 2019-11-25 ENCOUNTER — Encounter (HOSPITAL_COMMUNITY): Payer: BC Managed Care – PPO

## 2019-11-27 ENCOUNTER — Encounter (HOSPITAL_COMMUNITY): Payer: BC Managed Care – PPO

## 2019-11-29 ENCOUNTER — Encounter (HOSPITAL_COMMUNITY): Payer: BC Managed Care – PPO

## 2019-12-02 ENCOUNTER — Encounter (HOSPITAL_COMMUNITY): Payer: BC Managed Care – PPO

## 2019-12-04 ENCOUNTER — Encounter (HOSPITAL_COMMUNITY): Payer: BC Managed Care – PPO

## 2019-12-06 ENCOUNTER — Encounter (HOSPITAL_COMMUNITY): Payer: BC Managed Care – PPO

## 2019-12-13 DIAGNOSIS — E1121 Type 2 diabetes mellitus with diabetic nephropathy: Secondary | ICD-10-CM | POA: Diagnosis not present

## 2019-12-13 DIAGNOSIS — Z13220 Encounter for screening for lipoid disorders: Secondary | ICD-10-CM | POA: Diagnosis not present

## 2019-12-13 DIAGNOSIS — Z0000 Encounter for general adult medical examination without abnormal findings: Secondary | ICD-10-CM | POA: Diagnosis not present

## 2019-12-18 ENCOUNTER — Ambulatory Visit: Payer: BC Managed Care – PPO | Admitting: Cardiology

## 2019-12-24 ENCOUNTER — Telehealth: Payer: Self-pay

## 2019-12-24 DIAGNOSIS — M1611 Unilateral primary osteoarthritis, right hip: Secondary | ICD-10-CM | POA: Diagnosis not present

## 2019-12-24 DIAGNOSIS — M1712 Unilateral primary osteoarthritis, left knee: Secondary | ICD-10-CM | POA: Diagnosis not present

## 2019-12-24 DIAGNOSIS — J0100 Acute maxillary sinusitis, unspecified: Secondary | ICD-10-CM | POA: Diagnosis not present

## 2019-12-24 NOTE — Telephone Encounter (Signed)
   [facility] Pre-operative Risk Assessment    Request for surgical clearance:  1. What type of surgery is being performed? Left total knee replacement    2. When is this surgery scheduled? TBD   3. What type of clearance is required (medical clearance vs. Pharmacy clearance to hold med vs. Both)? Both   4. Are there any medications that need to be held prior to surgery and how long?Brilinta/ ASA 81    5. Practice name and name of physician performing surgery? [facility]   6. What is your office phone number [phone]    7.   What is your office fax number [phone] Attn: Kelly   8.   Anesthesia type (None, local, MAC, general) ? None    Mendel Ryder 12/24/2019, 12:08 PM  _________________________________________________________________   (provider comments below)

## 2019-12-24 NOTE — Telephone Encounter (Signed)
   Primary Cardiologist:Katarina Delton See, MD  Chart reviewed as part of pre-operative protocol coverage. Review of chart indicates upcoming appt on 01/01/20 with Tereso Newcomer. Preop can be addressed at this appt. I have updated appt notes and will route to provider.   Callback- please update patient and surgeon's office of upcoming appt at which clearance will be addressed. Thanks!  I will remove from pre op pool.   Laverda Page, NP  12/24/2019, 12:56 PM

## 2019-12-31 NOTE — Progress Notes (Signed)
Cardiology Office Note:    Date:  01/01/2020   ID:  Andrea Robertson, DOB 1961-08-12, MRN [id number]  PCP:  Andrea Cha, MD  Cardiologist:  Andrea Dawley, MD  Electrophysiologist:  None   Referring MD: Andrea Robertson,*   Chief Complaint:  Follow-up (CAD) and Surgical Clearance (Needs knee replacement )    Patient Profile:    Andrea Robertson is a 59 y.o. female with:   Coronary artery disease  S/p non-STEMI 06/2019 >> PCI: DES to the LAD  Dressler syndrome  Chronic diastolic CHF  Echocardiogram 06/2019: EF 50-55, Gr 1 DD  Hypertension  Hyperlipidemia  Diabetes mellitus  Chronic kidney disease  Prior CV studies: Event monitor 08/27/2019 Normal sinus rhythm, sinus tachycardia; no atrial fibrillation  Cardiac catheterization 07/03/2019 LAD mid 100 RCA proximal 30 PCI: 2.5 x 20 mm Synergy DES to the mid LAD  Echocardiogram 07/03/2019 EF 50-55, mildly increased septal wall thickness, GR 1 DD, mid to apical anteroseptum/apical anterior/apical myocardium HK, trivial TR, trivial AI, trivial PI  History of Present Illness:    Andrea Robertson was last seen in clinic by Andrea Barrios, PA-C in 08/2019.  She returns for follow-up.  Of note, we recently received a request from Andrea Robertson for surgical clearance for a left total knee replacement.    She is here alone.  Since last seen, she has done well without chest discomfort, shortness of breath, syncope, orthopnea or leg swelling.  She is able to do most activities at home without limitation.  Past Medical History:  Diagnosis Date  . AKI (acute kidney injury) ([place])   . Arthritis   . Coronary artery disease involving native coronary artery of native heart without angina pectoris 07/04/2019  . Depression   . Diabetes mellitus without complication ([place])   . Essential hypertension 10/30/2018  . GERD (gastroesophageal reflux disease)   . Headache    sinus headaches  . Hyperlipidemia 07/04/2019  .  PVC (premature ventricular contraction) 10/30/2018  . Shortness of breath   . Vertigo 10/30/2018    Current Medications: Current Meds  Medication Sig  . Accu-Chek FastClix Lancets MISC USE TO TEST YOUR BLOOD SUGAR TWICE A DAY DX E11.21  . amLODipine (NORVASC) 5 MG tablet Take 1 tablet (5 mg total) by mouth daily.  Marland Kitchen aspirin EC 81 MG EC tablet Take 1 tablet (81 mg total) by mouth daily.  Marland Kitchen atorvastatin (LIPITOR) 80 MG tablet Take 1 tablet (80 mg total) by mouth daily at 6 PM.  . buPROPion (WELLBUTRIN XL) 300 MG 24 hr tablet Take 300 mg by mouth daily.  . carvedilol (COREG) 25 MG tablet Take 1 tablet (25 mg total) by mouth 2 (two) times daily.  Marland Kitchen escitalopram (LEXAPRO) 10 MG tablet Take 10 mg by mouth daily.  Marland Kitchen FARXIGA 5 MG TABS tablet Take 5 mg by mouth daily.  . fluticasone (FLONASE) 50 MCG/ACT nasal spray Place 1 spray into both nostrils daily as needed for allergies or rhinitis.  Marland Kitchen meclizine (ANTIVERT) 25 MG tablet Take 1 tablet (25 mg total) by mouth 3 (three) times daily as needed for dizziness.  . Multiple Vitamin (MULTIVITAMIN WITH MINERALS) TABS tablet Take 1 tablet by mouth daily.  . nitroGLYCERIN (NITROSTAT) 0.4 MG SL tablet Place 1 tablet (0.4 mg total) under the tongue every 5 (five) minutes x 3 doses as needed for chest pain.  . ticagrelor (BRILINTA) 90 MG TABS tablet Take 1 tablet (90 mg total) by mouth 2 (two) times daily.  . [DISCONTINUED]  amLODipine (NORVASC) 5 MG tablet Take 1 tablet (5 mg total) by mouth daily.  . [DISCONTINUED] atorvastatin (LIPITOR) 80 MG tablet Take 1 tablet (80 mg total) by mouth daily at 6 PM.  . [DISCONTINUED] carvedilol (COREG) 25 MG tablet Take 1 tablet (25 mg total) by mouth 2 (two) times daily.  . [DISCONTINUED] ticagrelor (BRILINTA) 90 MG TABS tablet Take 1 tablet (90 mg total) by mouth 2 (two) times daily.     Allergies:   Dilaudid [hydromorphone hcl], Erythromycin, Penicillins, Percocet [oxycodone-acetaminophen], and Zithromax [azithromycin]    Social History   Tobacco Use  . Smoking status: Never Smoker  . Smokeless tobacco: Never Used  Substance Use Topics  . Alcohol use: Yes    Comment: Social drinker  . Drug use: No     Family Hx: The patient's family history includes CAD in her father.  Review of Systems  Musculoskeletal: Positive for joint pain.     EKGs/Labs/Other Test Reviewed:    EKG:  EKG is  ordered today.  The ekg ordered today demonstrates normal sinus rhythm, HR 68, normal axis, nonspecific ST-T wave changes, QTC 423  Recent Labs: 07/25/2019: TSH 1.360 08/08/2019: B Natriuretic Peptide 237.7; Hemoglobin 11.9; Platelets 358 08/28/2019: BUN 21; Creatinine, Ser 1.68; Potassium 3.9; Sodium 141   Recent Lipid Panel Lab Results  Component Value Date/Time   CHOL 197 07/04/2019 09:12 AM   TRIG 183 (H) 07/04/2019 09:12 AM   HDL 47 07/04/2019 09:12 AM   CHOLHDL 4.2 07/04/2019 09:12 AM   LDLCALC 113 (H) 07/04/2019 09:12 AM    Physical Exam:    VS:  BP 124/78   Pulse 68   Ht 5\' 3"  (1.6 m)   Wt 180 lb (81.6 kg)   SpO2 98%   BMI 31.89 kg/m     Wt Readings from Last 3 Encounters:  01/01/20 180 lb (81.6 kg)  10/08/19 179 lb 14.3 oz (81.6 kg)  09/11/19 179 lb (81.2 kg)     Constitutional:      Appearance: Healthy appearance. Not in distress.  Neck:     Thyroid: No thyromegaly.     Vascular: JVD normal.  Pulmonary:     Effort: Pulmonary effort is normal.     Breath sounds: No wheezing. No rales.  Cardiovascular:     Normal rate. Regular rhythm. Normal S1. Normal S2.     Murmurs: There is no murmur.  Edema:    Peripheral edema absent.  Abdominal:     Palpations: Abdomen is soft. There is no hepatomegaly.  Skin:    General: Skin is warm and dry.  Neurological:     Mental Status: Alert and oriented to person, place and time.     Cranial Nerves: Cranial nerves are intact.      ASSESSMENT & PLAN:    1. Coronary artery disease involving native coronary artery of native heart without  angina pectoris Status post non-STEMI in November 2020 treated with a drug-eluting stent to the LAD.  Her myocardial infarction was complicated by post infarct pericarditis.  This has resolved.  She is currently doing well without anginal symptoms.  Continue aspirin, ticagrelor, atorvastatin, carvedilol.  2. Chronic diastolic CHF (congestive heart failure) (HCC) EF 55% by echocardiogram November 2020 with mild diastolic dysfunction.  She is currently doing well without evidence of volume excess.  She does not take chronic diuretic therapy.  3. Essential hypertension The patient's blood pressure is controlled on her current regimen.  Continue current therapy.   4. Mixed  hyperlipidemia Continue high intensity statin therapy.  Goal LDL <70.  I will request most recent lipid panel from primary care.  5. Stage 3b chronic kidney disease Recent creatinine stable.  6. Preoperative cardiovascular examination She needs a left total knee replacement.  However, the patient will need to remain on aspirin and Ticagrelor for minimum of 12 months post MI without interruption.  Therefore, she will need to postpone her surgery until at least November.    Dispo:  Return in about 6 months (around 07/03/2020) for Routine Follow Up w/ Dr. Delton See, in person.   Medication Adjustments/Labs and Tests Ordered: Current medicines are reviewed at length with the patient today.  Concerns regarding medicines are outlined above.  Tests Ordered: Orders Placed This Encounter  Procedures  . EKG 12-Lead   Medication Changes: Meds ordered this encounter  Medications  . amLODipine (NORVASC) 5 MG tablet    Sig: Take 1 tablet (5 mg total) by mouth daily.    Dispense:  90 tablet    Refill:  3  . atorvastatin (LIPITOR) 80 MG tablet    Sig: Take 1 tablet (80 mg total) by mouth daily at 6 PM.    Dispense:  90 tablet    Refill:  3  . carvedilol (COREG) 25 MG tablet    Sig: Take 1 tablet (25 mg total) by mouth 2 (two)  times daily.    Dispense:  180 tablet    Refill:  3  . ticagrelor (BRILINTA) 90 MG TABS tablet    Sig: Take 1 tablet (90 mg total) by mouth 2 (two) times daily.    Dispense:  180 tablet    Refill:  3    Signed, Tereso Newcomer, PA-C  01/01/2020 9:41 AM    [facility] [address] [place], [place], [place]  [address] Phone: [phone]; Fax: [phone]

## 2020-01-01 ENCOUNTER — Encounter: Payer: Self-pay | Admitting: Physician Assistant

## 2020-01-01 ENCOUNTER — Other Ambulatory Visit: Payer: Self-pay

## 2020-01-01 ENCOUNTER — Ambulatory Visit (INDEPENDENT_AMBULATORY_CARE_PROVIDER_SITE_OTHER): Payer: BC Managed Care – PPO | Admitting: Physician Assistant

## 2020-01-01 VITALS — BP 124/78 | HR 68 | Ht 63.0 in | Wt 180.0 lb

## 2020-01-01 DIAGNOSIS — I10 Essential (primary) hypertension: Secondary | ICD-10-CM | POA: Diagnosis not present

## 2020-01-01 DIAGNOSIS — E782 Mixed hyperlipidemia: Secondary | ICD-10-CM | POA: Diagnosis not present

## 2020-01-01 DIAGNOSIS — N1832 Chronic kidney disease, stage 3b: Secondary | ICD-10-CM

## 2020-01-01 DIAGNOSIS — I2510 Atherosclerotic heart disease of native coronary artery without angina pectoris: Secondary | ICD-10-CM

## 2020-01-01 DIAGNOSIS — I5032 Chronic diastolic (congestive) heart failure: Secondary | ICD-10-CM | POA: Diagnosis not present

## 2020-01-01 DIAGNOSIS — Z01810 Encounter for preprocedural cardiovascular examination: Secondary | ICD-10-CM

## 2020-01-01 MED ORDER — TICAGRELOR 90 MG PO TABS: 90.0000 mg | ORAL_TABLET | Freq: Two times a day (BID) | ORAL | 3 refills | Status: DC

## 2020-01-01 MED ORDER — ATORVASTATIN CALCIUM 80 MG PO TABS: 80.0000 mg | ORAL_TABLET | Freq: Every day | ORAL | 3 refills | Status: DC

## 2020-01-01 MED ORDER — AMLODIPINE BESYLATE 5 MG PO TABS: 5.0000 mg | ORAL_TABLET | Freq: Every day | ORAL | 3 refills | Status: DC

## 2020-01-01 MED ORDER — CARVEDILOL 25 MG PO TABS: 25.0000 mg | ORAL_TABLET | Freq: Two times a day (BID) | ORAL | 3 refills | Status: DC

## 2020-01-01 NOTE — Patient Instructions (Signed)
Medication Instructions:   Your physician recommends that you continue on your current medications as directed. Please refer to the Current Medication list given to you today.  *If you need a refill on your cardiac medications before your next appointment, please call your pharmacy*  Lab Work:  None ordered today  Testing/Procedures:  None ordered today  Follow-Up: At [facility], you and your health needs are our priority.  As part of our continuing mission to provide you with exceptional heart care, we have created designated Provider Care Teams.  These Care Teams include your primary Cardiologist (physician) and Advanced Practice Providers (APPs -  Physician Assistants and Nurse Practitioners) who all work together to provide you with the care you need, when you need it.  We recommend signing up for the patient portal called "MyChart".  Sign up information is provided on this After Visit Summary.  MyChart is used to connect with patients for Virtual Visits (Telemedicine).  Patients are able to view lab/test results, encounter notes, upcoming appointments, etc.  Non-urgent messages can be sent to your provider as well.   To learn more about what you can do with MyChart, go to [email or web address].    Your next appointment:   6 month(s)  The format for your next appointment:   In Person  Provider:   Tobias Alexander, MD

## 2020-02-17 DIAGNOSIS — M1712 Unilateral primary osteoarthritis, left knee: Secondary | ICD-10-CM | POA: Diagnosis not present

## 2020-02-18 ENCOUNTER — Ambulatory Visit: Payer: BC Managed Care – PPO | Admitting: Cardiology

## 2020-02-25 DIAGNOSIS — M1712 Unilateral primary osteoarthritis, left knee: Secondary | ICD-10-CM | POA: Diagnosis not present

## 2020-02-26 DIAGNOSIS — Z01419 Encounter for gynecological examination (general) (routine) without abnormal findings: Secondary | ICD-10-CM | POA: Diagnosis not present

## 2020-02-26 DIAGNOSIS — Z113 Encounter for screening for infections with a predominantly sexual mode of transmission: Secondary | ICD-10-CM | POA: Diagnosis not present

## 2020-02-26 DIAGNOSIS — Z1231 Encounter for screening mammogram for malignant neoplasm of breast: Secondary | ICD-10-CM | POA: Diagnosis not present

## 2020-02-26 DIAGNOSIS — Z6833 Body mass index (BMI) 33.0-33.9, adult: Secondary | ICD-10-CM | POA: Diagnosis not present

## 2020-03-02 ENCOUNTER — Other Ambulatory Visit: Payer: Self-pay | Admitting: Obstetrics and Gynecology

## 2020-03-02 ENCOUNTER — Telehealth: Payer: Self-pay

## 2020-03-02 DIAGNOSIS — M62830 Muscle spasm of back: Secondary | ICD-10-CM | POA: Diagnosis not present

## 2020-03-02 DIAGNOSIS — T148XXA Other injury of unspecified body region, initial encounter: Secondary | ICD-10-CM | POA: Diagnosis not present

## 2020-03-02 DIAGNOSIS — R928 Other abnormal and inconclusive findings on diagnostic imaging of breast: Secondary | ICD-10-CM

## 2020-03-02 DIAGNOSIS — I10 Essential (primary) hypertension: Secondary | ICD-10-CM | POA: Diagnosis not present

## 2020-03-02 DIAGNOSIS — N183 Chronic kidney disease, stage 3 unspecified: Secondary | ICD-10-CM | POA: Diagnosis not present

## 2020-03-02 NOTE — Telephone Encounter (Signed)
NOTES ON FILE FROM [facility] [phone] SENT REFERRAL TO SCHEDULING

## 2020-03-03 DIAGNOSIS — M1712 Unilateral primary osteoarthritis, left knee: Secondary | ICD-10-CM | POA: Diagnosis not present

## 2020-03-05 ENCOUNTER — Ambulatory Visit
Admission: RE | Admit: 2020-03-05 | Discharge: 2020-03-05 | Disposition: A | Discharge status: Home / Self Care | Payer: BC Managed Care – PPO | Source: Ambulatory Visit | Attending: Obstetrics and Gynecology | Admitting: Obstetrics and Gynecology

## 2020-03-05 ENCOUNTER — Other Ambulatory Visit: Payer: Self-pay

## 2020-03-05 ENCOUNTER — Other Ambulatory Visit: Payer: Self-pay | Admitting: Obstetrics and Gynecology

## 2020-03-05 DIAGNOSIS — R928 Other abnormal and inconclusive findings on diagnostic imaging of breast: Secondary | ICD-10-CM | POA: Diagnosis not present

## 2020-03-05 DIAGNOSIS — N6312 Unspecified lump in the right breast, upper inner quadrant: Secondary | ICD-10-CM | POA: Diagnosis not present

## 2020-05-12 DIAGNOSIS — E119 Type 2 diabetes mellitus without complications: Secondary | ICD-10-CM | POA: Diagnosis not present

## 2020-06-05 ENCOUNTER — Other Ambulatory Visit: Payer: BC Managed Care – PPO

## 2020-06-08 ENCOUNTER — Ambulatory Visit
Admission: RE | Admit: 2020-06-08 | Discharge: 2020-06-08 | Disposition: A | Discharge status: Home / Self Care | Payer: BC Managed Care – PPO | Source: Ambulatory Visit | Attending: Obstetrics and Gynecology | Admitting: Obstetrics and Gynecology

## 2020-06-08 ENCOUNTER — Other Ambulatory Visit: Payer: Self-pay

## 2020-06-08 DIAGNOSIS — N641 Fat necrosis of breast: Secondary | ICD-10-CM | POA: Diagnosis not present

## 2020-06-08 DIAGNOSIS — R928 Other abnormal and inconclusive findings on diagnostic imaging of breast: Secondary | ICD-10-CM

## 2020-06-15 DIAGNOSIS — F3341 Major depressive disorder, recurrent, in partial remission: Secondary | ICD-10-CM | POA: Diagnosis not present

## 2020-06-15 DIAGNOSIS — E1121 Type 2 diabetes mellitus with diabetic nephropathy: Secondary | ICD-10-CM | POA: Diagnosis not present

## 2020-06-15 DIAGNOSIS — Z23 Encounter for immunization: Secondary | ICD-10-CM | POA: Diagnosis not present

## 2020-06-15 DIAGNOSIS — I2510 Atherosclerotic heart disease of native coronary artery without angina pectoris: Secondary | ICD-10-CM | POA: Diagnosis not present

## 2020-06-30 ENCOUNTER — Encounter: Payer: Self-pay | Admitting: *Deleted

## 2020-06-30 ENCOUNTER — Other Ambulatory Visit: Payer: Self-pay

## 2020-06-30 ENCOUNTER — Encounter: Payer: Self-pay | Admitting: Cardiology

## 2020-06-30 ENCOUNTER — Ambulatory Visit (INDEPENDENT_AMBULATORY_CARE_PROVIDER_SITE_OTHER): Payer: BC Managed Care – PPO | Admitting: Cardiology

## 2020-06-30 VITALS — BP 132/80 | HR 73 | Ht 63.0 in | Wt 186.8 lb

## 2020-06-30 DIAGNOSIS — I10 Essential (primary) hypertension: Secondary | ICD-10-CM

## 2020-06-30 DIAGNOSIS — R0609 Other forms of dyspnea: Secondary | ICD-10-CM

## 2020-06-30 DIAGNOSIS — I2510 Atherosclerotic heart disease of native coronary artery without angina pectoris: Secondary | ICD-10-CM

## 2020-06-30 DIAGNOSIS — R0600 Dyspnea, unspecified: Secondary | ICD-10-CM | POA: Diagnosis not present

## 2020-06-30 DIAGNOSIS — I5032 Chronic diastolic (congestive) heart failure: Secondary | ICD-10-CM

## 2020-06-30 DIAGNOSIS — E785 Hyperlipidemia, unspecified: Secondary | ICD-10-CM

## 2020-06-30 DIAGNOSIS — E782 Mixed hyperlipidemia: Secondary | ICD-10-CM

## 2020-06-30 DIAGNOSIS — Z01810 Encounter for preprocedural cardiovascular examination: Secondary | ICD-10-CM

## 2020-06-30 MED ORDER — TICAGRELOR 90 MG PO TABS: 90.0000 mg | ORAL_TABLET | Freq: Two times a day (BID) | ORAL | 3 refills | Status: DC

## 2020-06-30 MED ORDER — ATORVASTATIN CALCIUM 80 MG PO TABS: 80.0000 mg | ORAL_TABLET | Freq: Every day | ORAL | 3 refills | Status: DC

## 2020-06-30 NOTE — Progress Notes (Signed)
Cardiology Office Note:    Date:  06/30/2020   ID:  [name], DOB [birth year], MRN [id number]  PCP:  Lorenda Ishihara, MD  Cardiologist:  Tobias Alexander, MD  Electrophysiologist:  None   Referring MD: Lorenda Ishihara,*   Chief Complaint: Dyspnea on exertion  Patient Profile:    Reka Wist is a 59 y.o. female with:   Coronary artery disease  S/p non-STEMI 06/2019 >> PCI: DES to the LAD  Dressler syndrome  Chronic diastolic CHF  Echocardiogram 06/2019: EF 50-55, Gr 1 DD  Hypertension  Hyperlipidemia  Diabetes mellitus  Chronic kidney disease  Prior CV studies: Event monitor 08/27/2019 Normal sinus rhythm, sinus tachycardia; no atrial fibrillation  Cardiac catheterization 07/03/2019 LAD mid 100 RCA proximal 30 PCI: 2.5 x 20 mm Synergy DES to the mid LAD  Echocardiogram 07/03/2019 EF 50-55, mildly increased septal wall thickness, GR 1 DD, mid to apical anteroseptum/apical anterior/apical myocardium HK, trivial TR, trivial AI, trivial PI  History of Present Illness:    Ms. Mauch today she states that she has noticed new dyspnea on exertion, she continues to walk but with some difficulties, she is experiencing arthritis in her knees and might need knee surgery in the future.  She denies any lower extremity edema orthopnea proximal nocturnal dyspnea.  She has been tolerating her medications well and has no side effects, no bleeding and no myalgias.  Her blood pressure has been controlled.  Past Medical History:  Diagnosis Date  . AKI (acute kidney injury) (H[facility])   . Arthritis   . Coronary artery disease involving native coronary artery of native heart without angina pectoris 07/04/2019  . Depression   . Diabetes mellitus without complication (H[facility])   . Essential hypertension 10/30/2018  . GERD (gastroesophageal reflux disease)   . Headache    sinus headaches  . Hyperlipidemia 07/04/2019  . PVC (premature ventricular contraction)  10/30/2018  . Shortness of breath   . Vertigo 10/30/2018    Current Medications: Current Meds  Medication Sig  . Accu-Chek FastClix Lancets MISC USE TO TEST YOUR BLOOD SUGAR TWICE A DAY DX E11.21  . amLODipine (NORVASC) 5 MG tablet Take 1 tablet (5 mg total) by mouth daily.  Marland Kitchen aspirin EC 81 MG EC tablet Take 1 tablet (81 mg total) by mouth daily.  Marland Kitchen atorvastatin (LIPITOR) 80 MG tablet Take 1 tablet (80 mg total) by mouth daily at 6 PM.  . buPROPion (WELLBUTRIN XL) 300 MG 24 hr tablet Take 300 mg by mouth daily.  Marland Kitchen escitalopram (LEXAPRO) 10 MG tablet Take 10 mg by mouth daily.  Marland Kitchen FARXIGA 5 MG TABS tablet Take 5 mg by mouth daily.  . fluticasone (FLONASE) 50 MCG/ACT nasal spray Place 1 spray into both nostrils daily as needed for allergies or rhinitis.  Marland Kitchen meclizine (ANTIVERT) 25 MG tablet Take 1 tablet (25 mg total) by mouth 3 (three) times daily as needed for dizziness.  . Multiple Vitamin (MULTIVITAMIN WITH MINERALS) TABS tablet Take 1 tablet by mouth daily.  . nitroGLYCERIN (NITROSTAT) 0.4 MG SL tablet Place 1 tablet (0.4 mg total) under the tongue every 5 (five) minutes x 3 doses as needed for chest pain.  . ticagrelor (BRILINTA) 90 MG TABS tablet Take 1 tablet (90 mg total) by mouth 2 (two) times daily.  . [DISCONTINUED] atorvastatin (LIPITOR) 80 MG tablet Take 1 tablet (80 mg total) by mouth daily at 6 PM.  . [DISCONTINUED] ticagrelor (BRILINTA) 90 MG TABS tablet Take 1 tablet (90 mg total)  by mouth 2 (two) times daily.     Allergies:   Dilaudid [hydromorphone hcl], Erythromycin, Penicillins, Percocet [oxycodone-acetaminophen], and Zithromax [azithromycin]   Social History   Tobacco Use  . Smoking status: Never Smoker  . Smokeless tobacco: Never Used  Vaping Use  . Vaping Use: Never used  Substance Use Topics  . Alcohol use: Yes    Comment: Social drinker  . Drug use: No     Family Hx: The patient's family history includes CAD in her father.  Review of Systems    Musculoskeletal: Positive for joint pain.     EKGs/Labs/Other Test Reviewed:    EKG:  EKG is  ordered today.  The ekg ordered today demonstrates normal sinus rhythm, HR 70 bpm, anterior MI with Q waves in the anterior leads age undetermined, unchanged from prior.  This was personally reviewed.    Recent Labs: 07/25/2019: TSH 1.360 08/08/2019: B Natriuretic Peptide 237.7; Hemoglobin 11.9; Platelets 358 08/28/2019: BUN 21; Creatinine, Ser 1.68; Potassium 3.9; Sodium 141   Recent Lipid Panel Lab Results  Component Value Date/Time   CHOL 197 07/04/2019 09:12 AM   TRIG 183 (H) 07/04/2019 09:12 AM   HDL 47 07/04/2019 09:12 AM   CHOLHDL 4.2 07/04/2019 09:12 AM   LDLCALC 113 (H) 07/04/2019 09:12 AM    Physical Exam:    VS:  BP 132/80   Pulse 73   Ht 5\' 3"  (1.6 m)   Wt 186 lb 12.8 oz (84.7 kg)   SpO2 98%   BMI 33.09 kg/m     Wt Readings from Last 3 Encounters:  06/30/20 186 lb 12.8 oz (84.7 kg)  01/01/20 180 lb (81.6 kg)  10/08/19 179 lb 14.3 oz (81.6 kg)     Constitutional:      Appearance: Healthy appearance. Not in distress.  Neck:     Thyroid: No thyromegaly.     Vascular: JVD normal.  Pulmonary:     Effort: Pulmonary effort is normal.     Breath sounds: No wheezing. No rales.  Cardiovascular:     Normal rate. Regular rhythm. Normal S1. Normal S2.     Murmurs: There is no murmur.  Edema:    Peripheral edema absent.  Abdominal:     Palpations: Abdomen is soft. There is no hepatomegaly.  Skin:    General: Skin is warm and dry.  Neurological:     Mental Status: Alert and oriented to person, place and time.     Cranial Nerves: Cranial nerves are intact.      ASSESSMENT & PLAN:    1. Coronary artery disease involving native coronary artery of native heart without angina pectoris Status post STEMI in November 2020 treated with a drug-eluting stent to the LAD.  Her myocardial infarction was complicated by post infarct pericarditis.  This has resolved.  She has new  dyspnea on exertion, in the settings of preoperative evaluation we will obtain exercise nuclear stress test.   Continue aspirin, ticagrelor, atorvastatin, carvedilol.  2. Chronic diastolic CHF (congestive heart failure) (HCC) EF 55% by echocardiogram November 2020 with mild diastolic dysfunction.  No evidence for fluid overload.  3. Essential hypertension Well-controlled.  4. Mixed hyperlipidemia Continue high intensity statin therapy.  Goal LDL <70.  I will request most recent lipid panel from primary care.  5. Preoperative cardiovascular examination She needs a left total knee replacement.  We will obtain stress test and if normal refer to surgery.  Since it has been a year since her stent placement  she will be able to hold Brilinta a week prior and post surgery however I would recommend that she continues taking it.  Dispo:  No follow-ups on file.   Medication Adjustments/Labs and Tests Ordered: Current medicines are reviewed at length with the patient today.  Concerns regarding medicines are outlined above.  Tests Ordered: Orders Placed This Encounter  Procedures  . Myocardial Perfusion Imaging  . EKG 12-Lead   Medication Changes: Meds ordered this encounter  Medications  . ticagrelor (BRILINTA) 90 MG TABS tablet    Sig: Take 1 tablet (90 mg total) by mouth 2 (two) times daily.    Dispense:  180 tablet    Refill:  3  . atorvastatin (LIPITOR) 80 MG tablet    Sig: Take 1 tablet (80 mg total) by mouth daily at 6 PM.    Dispense:  90 tablet    Refill:  3    Signed, Tobias Alexander, MD  06/30/2020 3:13 PM    [facility] [address] [place], [place], [place]  [address] Phone: [phone]; Fax: [phone]

## 2020-06-30 NOTE — Patient Instructions (Signed)
Medication Instructions:   Your physician recommends that you continue on your current medications as directed. Please refer to the Current Medication list given to you today.  *If you need a refill on your cardiac medications before your next appointment, please call your pharmacy*   Testing/Procedures:  Your physician has requested that you have en exercise stress myoview. For further information please visit [email or web address] Please follow instruction sheet, as given.   Follow-Up: At [facility], you and your health needs are our priority.  As part of our continuing mission to provide you with exceptional heart care, we have created designated Provider Care Teams.  These Care Teams include your primary Cardiologist (physician) and Advanced Practice Providers (APPs -  Physician Assistants and Nurse Practitioners) who all work together to provide you with the care you need, when you need it.  We recommend signing up for the patient portal called "MyChart".  Sign up information is provided on this After Visit Summary.  MyChart is used to connect with patients for Virtual Visits (Telemedicine).  Patients are able to view lab/test results, encounter notes, upcoming appointments, etc.  Non-urgent messages can be sent to your provider as well.   To learn more about what you can do with MyChart, go to [email or web address].    Your next appointment:   6 month(s)  The format for your next appointment:   In Person  Provider:   Tobias Alexander, MD

## 2020-07-02 ENCOUNTER — Telehealth (HOSPITAL_COMMUNITY): Payer: Self-pay

## 2020-07-02 NOTE — Telephone Encounter (Signed)
Spoke to the patient, detailed instructions given. She stated she understood and would be here for her test. Asked to call back with any questions. S.Rayneisha Bouza EMTP

## 2020-07-06 ENCOUNTER — Other Ambulatory Visit (HOSPITAL_COMMUNITY)
Admission: RE | Admit: 2020-07-06 | Discharge: 2020-07-06 | Disposition: A | Discharge status: Home / Self Care | Payer: BC Managed Care – PPO | Source: Ambulatory Visit | Attending: Cardiology | Admitting: Cardiology

## 2020-07-06 DIAGNOSIS — Z20822 Contact with and (suspected) exposure to covid-19: Secondary | ICD-10-CM | POA: Insufficient documentation

## 2020-07-06 DIAGNOSIS — Z01812 Encounter for preprocedural laboratory examination: Secondary | ICD-10-CM | POA: Insufficient documentation

## 2020-07-06 LAB — SARS CORONAVIRUS 2 (TAT 6-24 HRS): SARS Coronavirus 2: NEGATIVE

## 2020-07-09 ENCOUNTER — Ambulatory Visit (HOSPITAL_COMMUNITY): Payer: BC Managed Care – PPO | Attending: Cardiology

## 2020-07-09 ENCOUNTER — Other Ambulatory Visit: Payer: Self-pay

## 2020-07-09 DIAGNOSIS — R0600 Dyspnea, unspecified: Secondary | ICD-10-CM | POA: Insufficient documentation

## 2020-07-09 DIAGNOSIS — E785 Hyperlipidemia, unspecified: Secondary | ICD-10-CM | POA: Diagnosis not present

## 2020-07-09 DIAGNOSIS — I2510 Atherosclerotic heart disease of native coronary artery without angina pectoris: Secondary | ICD-10-CM | POA: Insufficient documentation

## 2020-07-09 DIAGNOSIS — R0609 Other forms of dyspnea: Secondary | ICD-10-CM

## 2020-07-09 LAB — MYOCARDIAL PERFUSION IMAGING
LV dias vol: 128 mL (ref 46–106)
LV sys vol: 72 mL
Peak HR: 106 {beats}/min
Rest HR: 71 {beats}/min
SDS: 0
SRS: 21
SSS: 21
TID: 1.04

## 2020-07-09 MED ORDER — TECHNETIUM TC 99M TETROFOSMIN IV KIT
10.6000 | PACK | Freq: Once | INTRAVENOUS | Status: AC | PRN
  Administered 2020-07-09: 10.6 via INTRAVENOUS
  Filled 2020-07-09: qty 11

## 2020-07-09 MED ORDER — REGADENOSON 0.4 MG/5ML IV SOLN
0.4000 mg | Freq: Once | INTRAVENOUS | Status: AC
  Administered 2020-07-09: 0.4 mg via INTRAVENOUS

## 2020-07-09 MED ORDER — TECHNETIUM TC 99M TETROFOSMIN IV KIT
31.7000 | PACK | Freq: Once | INTRAVENOUS | Status: AC | PRN
  Administered 2020-07-09: 31.7 via INTRAVENOUS
  Filled 2020-07-09: qty 32

## 2020-07-10 ENCOUNTER — Telehealth: Payer: Self-pay | Admitting: Cardiology

## 2020-07-10 NOTE — Telephone Encounter (Signed)
Patient is requesting to discuss stress test results. 

## 2020-07-10 NOTE — Telephone Encounter (Signed)
Pt called in very upset with her myoview results that were already posted on mychart, but Dr. Delton See hasn't yet resulted this test. Pt states she is very frustrated with our process and allowing results to release to through mychart before she was even contacted by phone with them. Pt states when she reviewed the results in mychart, it scared her, and she would like for Dr. Delton See to result this today, so that she can be reassured of this issue.  Informed the pt that there was a preliminary read done on her myoview.  Apologies provided that she had to see this before getting a call from [place] with these results.  Informed the pt that Dr. Delton See has been out of the office the last 2 days, which is why I have not called her back yet with these results.  Informed the pt that I will send Dr. Delton See this message and have her result her myoview today, and I will follow-up with her shortly thereafter.  Pt states she is very upset, and expects this to happen.  Pt verbalized understanding and agrees with this plan.

## 2020-07-10 NOTE — Telephone Encounter (Signed)
Dr. Delton See personally contacted me about this pts myoview results.  Per Dr. Delton See, the pts myoview results showed that there is a scar and minimal ischemia, and she can be cleared for her upcoming surgery.  Will endorse this result to the pt.   Spoke with the pt and endorsed to her, her myoview results, as indicated above by Dr. Delton See.  Pt states she is holding off on her knee replacement sx until 2022, and she will contact our office then, to make [place] aware of her surgery date and where to send the cardiac clearance information to.  Pt appreciative for the prompt follow-up.  Pt verbalized understanding and agrees with this plan.

## 2020-09-15 DIAGNOSIS — M1712 Unilateral primary osteoarthritis, left knee: Secondary | ICD-10-CM | POA: Diagnosis not present

## 2020-09-18 DIAGNOSIS — R109 Unspecified abdominal pain: Secondary | ICD-10-CM | POA: Diagnosis not present

## 2020-09-18 DIAGNOSIS — M542 Cervicalgia: Secondary | ICD-10-CM | POA: Diagnosis not present

## 2020-09-18 DIAGNOSIS — M545 Low back pain, unspecified: Secondary | ICD-10-CM | POA: Diagnosis not present

## 2020-10-15 ENCOUNTER — Telehealth: Payer: Self-pay | Admitting: *Deleted

## 2020-10-15 NOTE — Telephone Encounter (Signed)
   Primary Cardiologist: Tobias Alexander, MD  Chart reviewed as part of pre-operative protocol coverage. Patient was contacted 10/15/2020 in reference to pre-operative risk assessment for pending surgery as outlined below.  Andrea Robertson was last seen on 06/30/2020 by Dr. Delton See.  Since that day, Andrea Robertson has done well without chest pain or shortness of breath.  Nuclear stress test by obtained in November 2021 showed no significant ischemia.  She may hold Brilinta for 5 days prior to the surgery.  Ideally she should continue on the aspirin through the surgery, however if absolutely needed, she may hold aspirin for 7 days prior to the surgery and restart DAPT as soon as possible after the surgery at the surgeon's discretion.  Therefore, based on ACC/AHA guidelines, the patient would be at acceptable risk for the planned procedure without further cardiovascular testing.   The patient was advised that if she develops new symptoms prior to surgery to contact our office to arrange for a follow-up visit, and she verbalized understanding.  I will route this recommendation to the requesting party via Epic fax function and remove from pre-op pool. Please call with questions.  [place], [place] 10/15/2020, 11:56 AM

## 2020-10-15 NOTE — Telephone Encounter (Signed)
   [facility] Pre-operative Risk Assessment    HEARTCARE STAFF: - Please ensure there is not already an duplicate clearance open for this procedure. - Under Visit Info/Reason for Call, type in Other and utilize the format Clearance MM/DD/YY or Clearance TBD. Do not use dashes or single digits. - If request is for dental extraction, please clarify the # of teeth to be extracted.  Request for surgical clearance:  1. What type of surgery is being performed? LEFT TOTAL KNEE REPLACEMENT   2. When is this surgery scheduled? 11/23/20   3. What type of clearance is required (medical clearance vs. Pharmacy clearance to hold med vs. Both)? MEDICAL  4. Are there any medications that need to be held prior to surgery and how long? ASA AND BRILINTA   5. Practice name and name of physician performing surgery? [facility]; DR. Elsie Saas   6. What is the office phone number? [phone]   7.   What is the office fax number? [place].   Anesthesia type (None, local, MAC, general) ? CHOICE   Julaine Hua 10/15/2020, 11:38 AM  _________________________________________________________________   (provider comments below)

## 2020-10-28 DIAGNOSIS — I2510 Atherosclerotic heart disease of native coronary artery without angina pectoris: Secondary | ICD-10-CM | POA: Diagnosis not present

## 2020-10-28 DIAGNOSIS — E1121 Type 2 diabetes mellitus with diabetic nephropathy: Secondary | ICD-10-CM | POA: Diagnosis not present

## 2020-11-10 ENCOUNTER — Encounter: Payer: Self-pay | Admitting: Orthopedic Surgery

## 2020-11-10 DIAGNOSIS — Z9109 Other allergy status, other than to drugs and biological substances: Secondary | ICD-10-CM | POA: Insufficient documentation

## 2020-11-10 DIAGNOSIS — E1121 Type 2 diabetes mellitus with diabetic nephropathy: Secondary | ICD-10-CM | POA: Insufficient documentation

## 2020-11-10 DIAGNOSIS — D573 Sickle-cell trait: Secondary | ICD-10-CM | POA: Diagnosis present

## 2020-11-10 DIAGNOSIS — M1712 Unilateral primary osteoarthritis, left knee: Secondary | ICD-10-CM | POA: Diagnosis present

## 2020-11-10 DIAGNOSIS — N183 Chronic kidney disease, stage 3 unspecified: Secondary | ICD-10-CM | POA: Diagnosis present

## 2020-11-10 DIAGNOSIS — Z96651 Presence of right artificial knee joint: Secondary | ICD-10-CM

## 2020-11-10 DIAGNOSIS — I214 Non-ST elevation (NSTEMI) myocardial infarction: Secondary | ICD-10-CM | POA: Insufficient documentation

## 2020-11-10 DIAGNOSIS — E669 Obesity, unspecified: Secondary | ICD-10-CM | POA: Diagnosis present

## 2020-11-10 DIAGNOSIS — F3340 Major depressive disorder, recurrent, in remission, unspecified: Secondary | ICD-10-CM

## 2020-11-10 DIAGNOSIS — N1831 Chronic kidney disease, stage 3a: Secondary | ICD-10-CM | POA: Diagnosis present

## 2020-11-10 DIAGNOSIS — R7303 Prediabetes: Secondary | ICD-10-CM | POA: Insufficient documentation

## 2020-11-10 HISTORY — DX: Other allergy status, other than to drugs and biological substances: Z91.09

## 2020-11-10 HISTORY — DX: Non-ST elevation (NSTEMI) myocardial infarction: I21.4

## 2020-11-10 HISTORY — DX: Major depressive disorder, recurrent, in remission, unspecified: F33.40

## 2020-11-10 NOTE — Progress Notes (Signed)
DUE TO COVID-19 ONLY ONE VISITOR IS ALLOWED TO COME WITH YOU AND STAY IN THE WAITING ROOM ONLY DURING PRE OP AND PROCEDURE DAY OF SURGERY. THE 1 VISITOR  MAY VISIT WITH YOU AFTER SURGERY IN YOUR PRIVATE ROOM DURING VISITING HOURS ONLY!  YOU NEED TO HAVE A COVID 19 TEST ON__3/31/22_____ @_______ , THIS TEST MUST BE DONE BEFORE SURGERY,  COVID TESTING SITE [address] [place] , IT IS ON THE RIGHT GOING OUT [address] APPROXIMATELY  2 MINUTES PAST [employer] ON THE RIGHT. ONCE YOUR COVID TEST IS COMPLETED,  PLEASE BEGIN THE QUARANTINE INSTRUCTIONS AS OUTLINED IN YOUR HANDOUT.                Andrea Robertson  11/10/2020   Your procedure is scheduled on:  11/23/2020   Report to [facility] Main  Entrance   Report to admitting at   0515 AM     Call this number if you have problems the morning of surgery [phone]    REMEMBER: NO  SOLID FOOD CANDY OR GUM AFTER MIDNIGHT. CLEAR LIQUIDS UNTIL  0415am        . NOTHING BY MOUTH EXCEPT CLEAR LIQUIDS UNTIL     0415am   . PLEASE FINISH ENSURE DRINK PER SURGEON ORDER  WHICH NEEDS TO BE COMPLETED AT   0415am  .      CLEAR LIQUID DIET   Foods Allowed                                                                    Coffee and tea, regular and decaf                            Fruit ices (not with fruit pulp)                                      Iced Popsicles                                    Carbonated beverages, regular and diet                                    Cranberry, grape and apple juices Sports drinks like Gatorade Lightly seasoned clear broth or consume(fat free) Sugar, honey syrup ___________________________________________________________________      BRUSH YOUR TEETH MORNING OF SURGERY AND RINSE YOUR MOUTH OUT, NO CHEWING GUM CANDY OR MINTS.     Take these medicines the morning of surgery with A SIP OF WATER: Amlodipine, Wellbutrin, Coreg, Lexapro, Allegra, Flonase   DO NOT TAKE  ANY DIABETIC MEDICATIONS DAY OF YOUR SURGERY                               You may not have any metal on your body including hair pins and  piercings  Do not wear jewelry, make-up, lotions, powders or perfumes, deodorant             Do not wear nail polish on your fingernails.  Do not shave  48 hours prior to surgery.              Men may shave face and neck.   Do not bring valuables to the hospital. [place].  Contacts, dentures or bridgework may not be worn into surgery.  Leave suitcase in the car. After surgery it may be brought to your room.     Patients discharged the day of surgery will not be allowed to drive home. IF YOU ARE HAVING SURGERY AND GOING HOME THE SAME DAY, YOU MUST HAVE AN ADULT TO DRIVE YOU HOME AND BE WITH YOU FOR 24 HOURS. YOU MAY GO HOME BY TAXI OR UBER OR ORTHERWISE, BUT AN ADULT MUST ACCOMPANY YOU HOME AND STAY WITH YOU FOR 24 HOURS.  Name and phone number of your driver:  Special Instructions: N/A              Please read over the following fact sheets you were given: _____________________________________________________________________  [facility] - Preparing for Surgery Before surgery, you can play an important role.  Because skin is not sterile, your skin needs to be as free of germs as possible.  You can reduce the number of germs on your skin by washing with CHG (chlorahexidine gluconate) soap before surgery.  CHG is an antiseptic cleaner which kills germs and bonds with the skin to continue killing germs even after washing. Please DO NOT use if you have an allergy to CHG or antibacterial soaps.  If your skin becomes reddened/irritated stop using the CHG and inform your nurse when you arrive at Short Stay. Do not shave (including legs and underarms) for at least 48 hours prior to the first CHG shower.  You may shave your face/neck. Please follow these instructions carefully:  1.  Shower with CHG  Soap the night before surgery and the  morning of Surgery.  2.  If you choose to wash your hair, wash your hair first as usual with your  normal  shampoo.  3.  After you shampoo, rinse your hair and body thoroughly to remove the  shampoo.                           4.  Use CHG as you would any other liquid soap.  You can apply chg directly  to the skin and wash                       Gently with a scrungie or clean washcloth.  5.  Apply the CHG Soap to your body ONLY FROM THE NECK DOWN.   Do not use on face/ open                           Wound or open sores. Avoid contact with eyes, ears mouth and genitals (private parts).                       Wash face,  Genitals (private parts) with your normal soap.             6.  Wash thoroughly, paying special attention to the area where your surgery  will be performed.  7.  Thoroughly rinse your body with warm water from the neck down.  8.  DO NOT shower/wash with your normal soap after using and rinsing off  the CHG Soap.                9.  Pat yourself dry with a clean towel.            10.  Wear clean pajamas.            11.  Place clean sheets on your bed the night of your first shower and do not  sleep with pets. Day of Surgery : Do not apply any lotions/deodorants the morning of surgery.  Please wear clean clothes to the hospital/surgery center.  FAILURE TO FOLLOW THESE INSTRUCTIONS MAY RESULT IN THE CANCELLATION OF YOUR SURGERY PATIENT SIGNATURE_________________________________  NURSE SIGNATURE__________________________________  ________________________________________________________________________

## 2020-11-10 NOTE — H&P (Signed)
TOTAL KNEE ADMISSION H&P  Patient is being admitted for left total knee arthroplasty.  Subjective:  Chief Complaint:left knee pain.  HPI: Andrea Robertson, 60 y.o. female, has a history of pain and functional disability in the left knee due to arthritis and has failed non-surgical conservative treatments for greater than 12 weeks to includeNSAID's and/or analgesics, corticosteriod injections, viscosupplementation injections, flexibility and strengthening excercises, use of assistive devices, weight reduction as appropriate and activity modification.  Onset of symptoms was gradual, starting 10 years ago with gradually worsening course since that time. The patient noted no past surgery on the left knee(s).  Patient currently rates pain in the left knee(s) at 10 out of 10 with activity. Patient has night pain, worsening of pain with activity and weight bearing, pain that interferes with activities of daily living, crepitus and joint swelling.  Patient has evidence of subchondral sclerosis, periarticular osteophytes and joint space narrowing by imaging studies.  There is no active infection.  Patient Active Problem List   Diagnosis Date Noted   Chronic kidney disease, stage 3 unspecified (HCC) 11/10/2020   Acute non-ST segment elevation myocardial infarction [facility]) 11/10/2020   Diabetic renal disease (HCC) 11/10/2020   Obesity 11/10/2020   Other allergy status, other than to drugs and biological substances 11/10/2020   Prediabetes 11/10/2020   Recurrent major depression in remission (HCC) 11/10/2020   Sickle cell trait (HCC) 11/10/2020   Primary localized osteoarthritis of left knee    S/P total knee arthroplasty, right    Pericarditis 08/27/2019   Diastolic CHF, chronic (HCC) 08/27/2019   Palpitations    Shortness of breath    Hyperlipidemia 07/04/2019   Atherosclerotic heart disease of native coronary artery without angina pectoris 07/04/2019   Chest pain 07/03/2019    Myocardial infarction (HCC) 07/03/2019   AKI (acute kidney injury) (HCC)    Parotid swelling 12/14/2018   Vertigo 10/30/2018   Essential hypertension 10/30/2018   PVC (premature ventricular contraction) 10/30/2018   History of total knee arthroplasty 10/13/2015   Past Medical History:  Diagnosis Date   AKI (acute kidney injury) (HCC)    Arthritis    Coronary artery disease involving native coronary artery of native heart without angina pectoris 07/04/2019   Depression    Diabetes mellitus without complication (HCC)    Essential hypertension 10/30/2018   GERD (gastroesophageal reflux disease)    Headache    sinus headaches   Hyperlipidemia 07/04/2019   Primary localized osteoarthritis of left knee    PVC (premature ventricular contraction) 10/30/2018   S/P total knee arthroplasty, right    Shortness of breath    Vertigo 10/30/2018    Past Surgical History:  Procedure Laterality Date   ABDOMINAL HYSTERECTOMY     CHOLECYSTECTOMY     CORONARY STENT INTERVENTION N/A 07/03/2019   Procedure: CORONARY STENT INTERVENTION;  Surgeon: Tonny Bollman, MD;  Location: [facility] INVASIVE CV LAB;  Service: Cardiovascular;  Laterality: N/A;   LEFT HEART CATH AND CORONARY ANGIOGRAPHY N/A 07/03/2019   Procedure: LEFT HEART CATH AND CORONARY ANGIOGRAPHY;  Surgeon: Tonny Bollman, MD;  Location: [facility] INVASIVE CV LAB;  Service: Cardiovascular;  Laterality: N/A;   TOTAL KNEE ARTHROPLASTY Right 10/13/2015   Procedure: RIGHT TOTAL KNEE ARTHROPLASTY;  Surgeon: Ranee Gosselin, MD;  Location: [facility] ORS;  Service: Orthopedics;  Laterality: Right;    No current facility-administered medications for this encounter.   Current Outpatient Medications  Medication Sig Dispense Refill Last Dose   Accu-Chek FastClix Lancets MISC USE TO TEST YOUR BLOOD  SUGAR TWICE A DAY DX E11.21   11/10/2020 at Unknown time   amLODipine (NORVASC) 5 MG tablet Take 1 tablet (5 mg total) by mouth daily. 90 tablet 3  11/10/2020 at Unknown time   aspirin EC 81 MG EC tablet Take 1 tablet (81 mg total) by mouth daily. 90 tablet 3 11/10/2020 at Unknown time   atorvastatin (LIPITOR) 80 MG tablet Take 1 tablet (80 mg total) by mouth daily at 6 PM. 90 tablet 3 11/10/2020 at Unknown time   buPROPion (WELLBUTRIN XL) 300 MG 24 hr tablet Take 300 mg by mouth daily.   11/10/2020 at Unknown time   carvedilol (COREG) 25 MG tablet Take 1 tablet (25 mg total) by mouth 2 (two) times daily. 180 tablet 3 11/10/2020 at Unknown time   escitalopram (LEXAPRO) 10 MG tablet Take 10 mg by mouth daily.   11/10/2020 at Unknown time   FARXIGA 5 MG TABS tablet Take 5 mg by mouth daily.   11/10/2020 at Unknown time   fexofenadine (ALLEGRA) 180 MG tablet Take 180 mg by mouth daily.   Past Week at Unknown time   fluticasone (FLONASE) 50 MCG/ACT nasal spray Place 1 spray into both nostrils daily.   Past Week at Unknown time   Multiple Vitamin (MULTIVITAMIN WITH MINERALS) TABS tablet Take 1 tablet by mouth daily.   11/10/2020 at Unknown time   ticagrelor (BRILINTA) 90 MG TABS tablet Take 1 tablet (90 mg total) by mouth 2 (two) times daily. 180 tablet 3 11/10/2020 at Unknown time   meclizine (ANTIVERT) 25 MG tablet Take 1 tablet (25 mg total) by mouth 3 (three) times daily as needed for dizziness. 30 tablet 0 More than a month at Unknown time   nitroGLYCERIN (NITROSTAT) 0.4 MG SL tablet Place 1 tablet (0.4 mg total) under the tongue every 5 (five) minutes x 3 doses as needed for chest pain. 25 tablet 1 Unknown at Unknown time   Allergies  Allergen Reactions   Dilaudid [Hydromorphone Hcl] Hives    Can tolerate if premedicated with benadryl   Erythromycin Nausea And Vomiting   Penicillins Hives and Nausea And Vomiting    Has patient had a PCN reaction causing immediate rash, facial/tongue/throat swelling, SOB or lightheadedness with hypotension: No Has patient had a PCN reaction causing severe rash involving mucus membranes or skin  necrosis: No Has patient had a PCN reaction that required hospitalization No Has patient had a PCN reaction occurring within the last 10 years: Yes If all of the above answers are "NO", then may proceed with Cephalosporin use. Other reaction(s): hives/vomiting   Percocet [Oxycodone-Acetaminophen] Itching and Nausea And Vomiting    Can tolerate with premedication of diphenhydramine   Zithromax [Azithromycin] Nausea And Vomiting   Cefprozil     Other reaction(s): rash but takes Ceftin   Codeine     Other reaction(s): nausea   Oxycodone     Other reaction(s): nausea, epigastric pain    Social History   Tobacco Use   Smoking status: Never Smoker   Smokeless tobacco: Never Used  Substance Use Topics   Alcohol use: Yes    Comment: Social drinker    Family History  Problem Relation Age of Onset   CAD Father      Review of Systems  Constitutional: Negative.   HENT: Negative.   Eyes: Negative.   Respiratory: Negative.   Cardiovascular: Negative.   Gastrointestinal: Negative.   Endocrine: Negative.   Genitourinary: Negative.   Musculoskeletal: Positive for back pain,  gait problem and joint swelling.  Skin: Negative.   Allergic/Immunologic: Negative.   Hematological: Negative.   Psychiatric/Behavioral: Negative.     Objective:  Physical Exam Constitutional:      Appearance: Normal appearance.  HENT:     Right Ear: External ear normal.     Left Ear: External ear normal.     Nose: Nose normal.     Mouth/Throat:     Pharynx: Oropharynx is clear.  Eyes:     Conjunctiva/sclera: Conjunctivae normal.  Cardiovascular:     Rate and Rhythm: Normal rate and regular rhythm.     Pulses: Normal pulses.     Heart sounds: Normal heart sounds.  Pulmonary:     Effort: Pulmonary effort is normal.     Breath sounds: Normal breath sounds.  Abdominal:     Palpations: Abdomen is soft.  Genitourinary:    Comments: Not pertinent to current symptomatology therefore not  examined. Musculoskeletal:     Cervical back: Neck supple.     Comments: She is independently ambulatory with a moderately antalgic gait.  Left knee has medial and lateral joint line tenderness.  2+ crepitus.  2+ synovitis.  Distal neurovascular exam is intact.  Right knee has well healed total knee incision.  Active range of motion 0-100 degrees.  Distal neurovascular exam is intact.   Skin:    General: Skin is warm and dry.     Capillary Refill: Capillary refill takes less than 2 seconds.  Neurological:     General: No focal deficit present.     Mental Status: She is alert.  Psychiatric:        Mood and Affect: Mood normal.        Behavior: Behavior normal.     Vital signs in last 24 hours: Temp:  [98.4 F (36.9 C)] 98.4 F (36.9 C) (03/22 1332) Pulse Rate:  [74] 74 (03/22 1332) Resp:  [16] 16 (03/22 1332) BP: (121)/(73) 121/73 (03/22 1332) SpO2:  [97 %] 97 % (03/22 1332)  Labs:   Estimated body mass index is 32.95 kg/m as calculated from the following:   Height as of 07/09/20: 5\' 3"  (1.6 m).   Weight as of 07/09/20: 84.4 kg.   Imaging Review Plain radiographs from 09/15/20 demonstrate severe degenerative joint disease of the left knee(s). The overall alignment issignificant varus. The bone quality appears to be good for age and reported activity level.      Assessment/Plan:  End stage arthritis, left knee  Principal Problem:   Primary localized osteoarthritis of left knee Active Problems:   History of total knee arthroplasty   Essential hypertension   Atherosclerotic heart disease of native coronary artery without angina pectoris   Diastolic CHF, chronic (HCC)   Chronic kidney disease, stage 3 unspecified (HCC)   Obesity   Sickle cell trait (HCC)   S/P total knee arthroplasty, right  The patient history, physical examination, clinical judgment of the provider and imaging studies are consistent with end stage degenerative joint disease of the left knee(s) and  total knee arthroplasty is deemed medically necessary. The treatment options including medical management, injection therapy arthroscopy and arthroplasty were discussed at length. The risks and benefits of total knee arthroplasty were presented and reviewed. The risks due to aseptic loosening, infection, stiffness, patella tracking problems, thromboembolic complications and other imponderables were discussed. The patient acknowledged the explanation, agreed to proceed with the plan and consent was signed. Patient is being admitted for inpatient treatment for surgery, pain control, PT,  OT, prophylactic antibiotics, VTE prophylaxis, progressive ambulation and ADL's and discharge planning. The patient is planning to be discharged the day after surgery   Will keep overnight for cardiac monitoring due to significant cardiac history.     Patient's anticipated LOS is less than 2 midnights, meeting these requirements: - Younger than 61 - Lives within 1 hour of care - Has a competent adult at home to recover with post-op recover - NO history of  - Chronic pain requiring opiods  - Diabetes  - Coronary Artery Disease  - Heart failure  - Heart attack  - Stroke  - DVT/VTE  - Cardiac arrhythmia  - Respiratory Failure/COPD  - Renal failure  - Anemia  - Advanced Liver disease

## 2020-11-11 ENCOUNTER — Encounter (HOSPITAL_COMMUNITY): Payer: Self-pay

## 2020-11-11 ENCOUNTER — Other Ambulatory Visit: Payer: Self-pay

## 2020-11-11 ENCOUNTER — Encounter (HOSPITAL_COMMUNITY)
Admission: RE | Admit: 2020-11-11 | Discharge: 2020-11-11 | Disposition: A | Discharge status: Home / Self Care | Payer: BC Managed Care – PPO | Source: Ambulatory Visit | Attending: Orthopedic Surgery | Admitting: Orthopedic Surgery

## 2020-11-11 DIAGNOSIS — Z01812 Encounter for preprocedural laboratory examination: Secondary | ICD-10-CM | POA: Insufficient documentation

## 2020-11-11 HISTORY — DX: Anxiety disorder, unspecified: F41.9

## 2020-11-11 HISTORY — DX: Acute myocardial infarction, unspecified: I21.9

## 2020-11-11 LAB — COMPREHENSIVE METABOLIC PANEL
ALT: 19 U/L (ref 0–44)
AST: 22 U/L (ref 15–41)
Albumin: 4.1 g/dL (ref 3.5–5.0)
Alkaline Phosphatase: 90 U/L (ref 38–126)
Anion gap: 8 (ref 5–15)
BUN: 19 mg/dL (ref 6–20)
CO2: 29 mmol/L (ref 22–32)
Calcium: 9.1 mg/dL (ref 8.9–10.3)
Chloride: 103 mmol/L (ref 98–111)
Creatinine, Ser: 1.28 mg/dL — ABNORMAL HIGH (ref 0.44–1.00)
GFR, Estimated: 49 mL/min — ABNORMAL LOW (ref >60)
Glucose, Bld: 109 mg/dL — ABNORMAL HIGH (ref 70–99)
Potassium: 3.8 mmol/L (ref 3.5–5.1)
Sodium: 140 mmol/L (ref 135–145)
Total Bilirubin: 1.2 mg/dL (ref 0.3–1.2)
Total Protein: 7.8 g/dL (ref 6.5–8.1)

## 2020-11-11 LAB — CBC WITH DIFFERENTIAL/PLATELET
Abs Immature Granulocytes: 0.04 10*3/uL (ref 0.00–0.07)
Basophils Absolute: 0.1 10*3/uL (ref 0.0–0.1)
Basophils Relative: 1 %
Eosinophils Absolute: 0.1 10*3/uL (ref 0.0–0.5)
Eosinophils Relative: 1 %
HCT: 41.9 % (ref 36.0–46.0)
Hemoglobin: 13.8 g/dL (ref 12.0–15.0)
Immature Granulocytes: 0 %
Lymphocytes Relative: 24 %
Lymphs Abs: 2.3 10*3/uL (ref 0.7–4.0)
MCH: 27.9 pg (ref 26.0–34.0)
MCHC: 32.9 g/dL (ref 30.0–36.0)
MCV: 84.8 fL (ref 80.0–100.0)
Monocytes Absolute: 0.6 10*3/uL (ref 0.1–1.0)
Monocytes Relative: 6 %
Neutro Abs: 6.5 10*3/uL (ref 1.7–7.7)
Neutrophils Relative %: 68 %
Platelets: 310 10*3/uL (ref 150–400)
RBC: 4.94 MIL/uL (ref 3.87–5.11)
RDW: 15.7 % — ABNORMAL HIGH (ref 11.5–15.5)
WBC: 9.7 10*3/uL (ref 4.0–10.5)
nRBC: 0 % (ref 0.0–0.2)

## 2020-11-11 LAB — HEMOGLOBIN A1C
Hgb A1c MFr Bld: 6.8 % — ABNORMAL HIGH (ref 4.8–5.6)
Mean Plasma Glucose: 148.46 mg/dL

## 2020-11-11 LAB — SURGICAL PCR SCREEN
MRSA, PCR: NEGATIVE
Staphylococcus aureus: NEGATIVE

## 2020-11-11 LAB — PROTIME-INR
INR: 1 (ref 0.8–1.2)
Prothrombin Time: 12.9 seconds (ref 11.4–15.2)

## 2020-11-11 LAB — APTT: aPTT: 29 seconds (ref 24–36)

## 2020-11-11 NOTE — Progress Notes (Signed)
LVMM for pt to hold Farxiga the day before surgery  Asked for call back from pt to make sure she receives message.

## 2020-11-11 NOTE — Progress Notes (Addendum)
Anesthesia Review:  PCP: DR Andrea Robertson at [facility]  Cardiologist : 06/30/2020- LOV with DR Andrea Robertson 10/15/20- Clearance- [facility]- Andrea Meng,Andrea Robertson  Chest x-ray : EKG : 09/2020- at [place]- have requested  06/30/20-epic  Echo : 2020  Stress test: 07/09/20  Cardiac Cath :  2020  Activity level: can do a flight of stairs without difficulty  Sleep Study/ CPAP : no  Fasting Blood Sugar :      / Checks Blood Sugar -- times a day:   Blood Thinner/ Instructions /Last Dose: ASA / Instructions/ Last Dose :  DM- type 2 - checks glucose once daily per pt  11/11/20- [id number] Brilinta- to stopy 5 days prior per pt  81 mg Aspirin- do not stop aspirin  Urine culture done 11/11/20 routed to Dr Andrea Robertson on 11/12/20.

## 2020-11-12 LAB — URINE CULTURE: Culture: <10000 — AB

## 2020-11-12 NOTE — Progress Notes (Signed)
Patient called back and reviewed with pt to not take [place] the day before surgery and no diabetics meds am of surgery.  PT voiced understanding.

## 2020-11-13 NOTE — Progress Notes (Signed)
Anesthesia Chart Review   Case: [id number] Date/Time: 11/23/20 0700   Procedure: TOTAL KNEE ARTHROPLASTY (Left Knee)   Anesthesia type: Spinal   Pre-op diagnosis: djd left knee   [facility] RO[facility] 07 / [facility]   Surgeons: Salvatore Marvel, MD      DISCUSSION:59 y.o. never smoker with h/o GERD, HTN, DM II, CAD, CKD Stage III, djd left knee scheduled for above procedure 11/23/2020 with Dr. Salvatore Marvel.   Per cardiology preoperative evaluation 10/15/2020, "Chart reviewed as part of pre-operative protocol coverage. Patient was contacted 10/15/2020 in reference to pre-operative risk assessment for pending surgery as outlined below.  Andrea Robertson was last seen on 06/30/2020 by Dr. Delton See.  Since that day, Andrea Robertson has done well without chest pain or shortness of breath.  Nuclear stress test by obtained in November 2021 showed no significant ischemia.  She may hold Brilinta for 5 days prior to the surgery.  Ideally she should continue on the aspirin through the surgery, however if absolutely needed, she may hold aspirin for 7 days prior to the surgery and restart DAPT as soon as possible after the surgery at the surgeon's discretion.  Therefore, based on ACC/AHA guidelines, the patient would be at acceptable risk for the planned procedure without further cardiovascular testing."  Anticipate pt can proceed with planned procedure barring acute status change.   VS: BP (!) 115/97   Pulse 81   Temp 36.8 C (Oral)   Resp 16   SpO2 99%   PROVIDERS: Lorenda Ishihara, MD is PCP   Tobias Alexander, MD is Cardiologist  LABS: Labs reviewed: Acceptable for surgery. (all labs ordered are listed, but only abnormal results are displayed)  Labs Reviewed  URINE CULTURE - Abnormal; Notable for the following components:      Result Value   Culture   (*)    Value: <10,000 COLONIES/mL INSIGNIFICANT GROWTH Performed at [facility], [address] [place], [place] [address]    All other  components within normal limits  CBC WITH DIFFERENTIAL/PLATELET - Abnormal; Notable for the following components:   RDW 15.7 (*)    All other components within normal limits  COMPREHENSIVE METABOLIC PANEL - Abnormal; Notable for the following components:   Glucose, Bld 109 (*)    Creatinine, Ser 1.28 (*)    GFR, Estimated 49 (*)    All other components within normal limits  HEMOGLOBIN A1C - Abnormal; Notable for the following components:   Hgb A1c MFr Bld 6.8 (*)    All other components within normal limits  SURGICAL PCR SCREEN  PROTIME-INR  APTT     IMAGES:   EKG: 06/30/2020 Rate 70 bpm  NSR Anterior infarct, age undetermined  CV: Echo 07/03/2019 IMPRESSIONS    1. Left ventricular ejection fraction, by visual estimation, is 50 to  55%. The left ventricle has normal function. Left ventricular septal wall  thickness was mildly increased. There is no left ventricular hypertrophy.  2. Left ventricular diastolic parameters are consistent with Grade I  diastolic dysfunction (impaired relaxation).  3. Hypokinesis of the mid to apical anteroseptum, apical anterior, and  apical myocardium.  4. Global right ventricle has normal systolic function.The right  ventricular size is normal. No increase in right ventricular wall  thickness.  5. Left atrial size was normal.  6. Right atrial size was normal.  7. Trivial pericardial effusion is present.  8. The mitral valve is normal in structure. No evidence of mitral valve  regurgitation. No  evidence of mitral stenosis.  9. The tricuspid valve is normal in structure. Tricuspid valve  regurgitation is trivial.  10. The aortic valve is tricuspid. Aortic valve regurgitation is trivial.  No evidence of aortic valve sclerosis or stenosis.  11. The pulmonic valve was normal in structure. Pulmonic valve  regurgitation is trivial.  12. Normal pulmonary artery systolic pressure.  13. The inferior vena cava is normal in size with  greater than 50%  respiratory variability, suggesting right atrial pressure of 3 mmHg. Past Medical History:  Diagnosis Date  . AKI (acute kidney injury) (HCC)    stage? 3 not followed by a kidney MD   . Anxiety   . Arthritis   . Coronary artery disease involving native coronary artery of native heart without angina pectoris 07/04/2019  . Depression   . Diabetes mellitus without complication (HCC)    type 2   . Essential hypertension 10/30/2018  . GERD (gastroesophageal reflux disease)   . Hyperlipidemia 07/04/2019  . Myocardial infarction (HCC)   . Primary localized osteoarthritis of left knee   . PVC (premature ventricular contraction) 10/30/2018  . S/P total knee arthroplasty, right   . Shortness of breath    pt denies   . Vertigo 10/30/2018    Past Surgical History:  Procedure Laterality Date  . ABDOMINAL HYSTERECTOMY    . CHOLECYSTECTOMY    . CORONARY STENT INTERVENTION N/A 07/03/2019   Procedure: CORONARY STENT INTERVENTION;  Surgeon: Tonny Bollman, MD;  Location: [facility] INVASIVE CV LAB;  Service: Cardiovascular;  Laterality: N/A;  . LEFT HEART CATH AND CORONARY ANGIOGRAPHY N/A 07/03/2019   Procedure: LEFT HEART CATH AND CORONARY ANGIOGRAPHY;  Surgeon: Tonny Bollman, MD;  Location: [facility] INVASIVE CV LAB;  Service: Cardiovascular;  Laterality: N/A;  . TOTAL KNEE ARTHROPLASTY Right 10/13/2015   Procedure: RIGHT TOTAL KNEE ARTHROPLASTY;  Surgeon: Ranee Gosselin, MD;  Location: [facility];  Service: Orthopedics;  Laterality: Right;    MEDICATIONS: . Accu-Chek FastClix Lancets MISC  . amLODipine (NORVASC) 5 MG tablet  . aspirin EC 81 MG EC tablet  . atorvastatin (LIPITOR) 80 MG tablet  . buPROPion (WELLBUTRIN XL) 300 MG 24 hr tablet  . carvedilol (COREG) 25 MG tablet  . escitalopram (LEXAPRO) 10 MG tablet  . FARXIGA 5 MG TABS tablet  . fexofenadine (ALLEGRA) 180 MG tablet  . fluticasone (FLONASE) 50 MCG/ACT nasal spray  . meclizine (ANTIVERT) 25 MG tablet  . Multiple Vitamin  (MULTIVITAMIN WITH MINERALS) TABS tablet  . nitroGLYCERIN (NITROSTAT) 0.4 MG SL tablet  . ticagrelor (BRILINTA) 90 MG TABS tablet   No current facility-administered medications for this encounter.    Jodell Cipro, PA-C [facility] Pre-Surgical Testing [phone]

## 2020-11-13 NOTE — Anesthesia Preprocedure Evaluation (Addendum)
Anesthesia Evaluation  Patient identified by MRN, date of birth, ID band Patient awake    Reviewed: Allergy & Precautions, NPO status , Patient's Chart, lab work & pertinent test results  History of Anesthesia Complications Negative for: history of anesthetic complications  Airway Mallampati: III  TM Distance: >3 FB Neck ROM: Full   Comment:  Large tongue  Dental  (+) Dental Advisory Given, Teeth Intact   Pulmonary neg pulmonary ROS,    Pulmonary exam normal        Cardiovascular hypertension, Pt. on medications + CAD, + Past MI, + Cardiac Stents and +CHF  Normal cardiovascular exam   '21 Stress Echo - Nuclear stress EF: 44%. There was no ST segment deviation noted during stress. No T wave inversion was noted during stress. Defect 1: There is a large defect of severe severity present in the mid inferior, mid inferolateral, apical anterior, apical septal, apical inferior, apical lateral and apex location. Findings consistent with prior myocardial infarction with peri-infarct ischemia. This is a high risk study.  Large apical defect with nearly zero uptake at stress and rest. There is a partially reversible portion at the apical anterior/anterolateral wall, but the vast majority of the defect is not reversible. High risk study. Findings communicated with Dr. Tobias Alexander.  '20 Cath - 1. Total occlusion of the mid-LAD, treated successfully with PCI using a 2.5x20 mm Synergy DES 2. Mild nonobstructive stenosis of the LCx and RCA 3. Mildly elevated LVEDP    Neuro/Psych PSYCHIATRIC DISORDERS Anxiety Depression  Vertigo     GI/Hepatic Neg liver ROS, GERD  ,  Endo/Other  diabetes, Type 2, Oral Hypoglycemic Agents Obesity   Renal/GU CRFRenal disease     Musculoskeletal  (+) Arthritis ,   Abdominal   Peds  Hematology  (+) Blood dyscrasia, Sickle cell trait ,  On ticagrelor, last dose 4.5 days ago (Wed.  PM) Plt 310k    Anesthesia Other Findings Covid test negative See PAT note   Reproductive/Obstetrics                           Anesthesia Physical Anesthesia Plan  ASA: III  Anesthesia Plan: General   Post-op Pain Management:    Induction: Intravenous  PONV Risk Score and Plan: 3 and Treatment may vary due to age or medical condition, Ondansetron, Dexamethasone and Midazolam  Airway Management Planned: LMA  Additional Equipment: None  Intra-op Plan:   Post-operative Plan: Extubation in OR  Informed Consent: I have reviewed the patients History and Physical, chart, labs and discussed the procedure including the risks, benefits and alternatives for the proposed anesthesia with the patient or authorized representative who has indicated his/her understanding and acceptance.     Dental advisory given  Plan Discussed with: CRNA and Anesthesiologist  Anesthesia Plan Comments:       Anesthesia Quick Evaluation

## 2020-11-19 ENCOUNTER — Other Ambulatory Visit (HOSPITAL_COMMUNITY): Payer: BC Managed Care – PPO

## 2020-11-20 ENCOUNTER — Other Ambulatory Visit (HOSPITAL_COMMUNITY)
Admission: RE | Admit: 2020-11-20 | Discharge: 2020-11-20 | Disposition: A | Discharge status: Home / Self Care | Payer: BC Managed Care – PPO | Source: Ambulatory Visit | Attending: Orthopedic Surgery | Admitting: Orthopedic Surgery

## 2020-11-20 DIAGNOSIS — Z01812 Encounter for preprocedural laboratory examination: Secondary | ICD-10-CM | POA: Insufficient documentation

## 2020-11-20 DIAGNOSIS — Z20822 Contact with and (suspected) exposure to covid-19: Secondary | ICD-10-CM | POA: Insufficient documentation

## 2020-11-21 LAB — SARS CORONAVIRUS 2 (TAT 6-24 HRS): SARS Coronavirus 2: NEGATIVE

## 2020-11-22 MED ORDER — BUPIVACAINE LIPOSOME 1.3 % IJ SUSP
20.0000 mL | INTRAMUSCULAR | Status: DC
  Filled 2020-11-22: qty 20

## 2020-11-23 ENCOUNTER — Ambulatory Visit (HOSPITAL_COMMUNITY): Payer: BC Managed Care – PPO | Admitting: Physician Assistant

## 2020-11-23 ENCOUNTER — Encounter (HOSPITAL_COMMUNITY)
Admission: RE | Disposition: A | Discharge status: Home / Self Care | Payer: Self-pay | Source: Ambulatory Visit | Attending: Orthopedic Surgery

## 2020-11-23 ENCOUNTER — Observation Stay (HOSPITAL_COMMUNITY)
Admission: RE | Admit: 2020-11-23 | Discharge: 2020-11-24 | Disposition: A | Discharge status: Home / Self Care | Payer: BC Managed Care – PPO | Source: Ambulatory Visit | Attending: Orthopedic Surgery | Admitting: Orthopedic Surgery

## 2020-11-23 ENCOUNTER — Ambulatory Visit (HOSPITAL_COMMUNITY): Payer: BC Managed Care – PPO | Admitting: Anesthesiology

## 2020-11-23 ENCOUNTER — Encounter (HOSPITAL_COMMUNITY): Payer: Self-pay | Admitting: Orthopedic Surgery

## 2020-11-23 ENCOUNTER — Other Ambulatory Visit: Payer: Self-pay

## 2020-11-23 DIAGNOSIS — Z96651 Presence of right artificial knee joint: Secondary | ICD-10-CM

## 2020-11-23 DIAGNOSIS — I5032 Chronic diastolic (congestive) heart failure: Secondary | ICD-10-CM | POA: Insufficient documentation

## 2020-11-23 DIAGNOSIS — D573 Sickle-cell trait: Secondary | ICD-10-CM | POA: Diagnosis present

## 2020-11-23 DIAGNOSIS — Z7982 Long term (current) use of aspirin: Secondary | ICD-10-CM | POA: Diagnosis not present

## 2020-11-23 DIAGNOSIS — Z794 Long term (current) use of insulin: Secondary | ICD-10-CM | POA: Insufficient documentation

## 2020-11-23 DIAGNOSIS — I2510 Atherosclerotic heart disease of native coronary artery without angina pectoris: Secondary | ICD-10-CM | POA: Diagnosis not present

## 2020-11-23 DIAGNOSIS — N1831 Chronic kidney disease, stage 3a: Secondary | ICD-10-CM | POA: Diagnosis present

## 2020-11-23 DIAGNOSIS — E669 Obesity, unspecified: Secondary | ICD-10-CM | POA: Diagnosis present

## 2020-11-23 DIAGNOSIS — M1712 Unilateral primary osteoarthritis, left knee: Principal | ICD-10-CM | POA: Diagnosis present

## 2020-11-23 DIAGNOSIS — G8918 Other acute postprocedural pain: Secondary | ICD-10-CM | POA: Diagnosis not present

## 2020-11-23 DIAGNOSIS — I25118 Atherosclerotic heart disease of native coronary artery with other forms of angina pectoris: Secondary | ICD-10-CM | POA: Diagnosis present

## 2020-11-23 DIAGNOSIS — I130 Hypertensive heart and chronic kidney disease with heart failure and stage 1 through stage 4 chronic kidney disease, or unspecified chronic kidney disease: Secondary | ICD-10-CM | POA: Insufficient documentation

## 2020-11-23 DIAGNOSIS — E1122 Type 2 diabetes mellitus with diabetic chronic kidney disease: Secondary | ICD-10-CM | POA: Insufficient documentation

## 2020-11-23 DIAGNOSIS — N183 Chronic kidney disease, stage 3 unspecified: Secondary | ICD-10-CM | POA: Insufficient documentation

## 2020-11-23 DIAGNOSIS — Z96659 Presence of unspecified artificial knee joint: Secondary | ICD-10-CM

## 2020-11-23 DIAGNOSIS — I10 Essential (primary) hypertension: Secondary | ICD-10-CM | POA: Diagnosis present

## 2020-11-23 DIAGNOSIS — Z79899 Other long term (current) drug therapy: Secondary | ICD-10-CM | POA: Insufficient documentation

## 2020-11-23 DIAGNOSIS — K219 Gastro-esophageal reflux disease without esophagitis: Secondary | ICD-10-CM | POA: Diagnosis present

## 2020-11-23 HISTORY — PX: TOTAL KNEE ARTHROPLASTY: SHX125

## 2020-11-23 HISTORY — DX: Presence of right artificial knee joint: Z96.651

## 2020-11-23 HISTORY — DX: Unilateral primary osteoarthritis, left knee: M17.12

## 2020-11-23 LAB — GLUCOSE, CAPILLARY
Glucose-Capillary: 123 mg/dL — ABNORMAL HIGH (ref 70–99)
Glucose-Capillary: 163 mg/dL — ABNORMAL HIGH (ref 70–99)
Glucose-Capillary: 192 mg/dL — ABNORMAL HIGH (ref 70–99)
Glucose-Capillary: 172 mg/dL — ABNORMAL HIGH (ref 70–99)
Glucose-Capillary: 172 mg/dL — ABNORMAL HIGH (ref 70–99)

## 2020-11-23 SURGERY — ARTHROPLASTY, KNEE, TOTAL
Anesthesia: Spinal | Site: Knee | Laterality: Left

## 2020-11-23 MED ORDER — ALBUTEROL SULFATE HFA 108 (90 BASE) MCG/ACT IN AERS
INHALATION_SPRAY | RESPIRATORY_TRACT | Status: AC
  Filled 2020-11-23: qty 6.7

## 2020-11-23 MED ORDER — GLYCOPYRROLATE PF 0.2 MG/ML IJ SOSY
PREFILLED_SYRINGE | INTRAMUSCULAR | Status: DC | PRN
  Administered 2020-11-23: .1 mg via INTRAVENOUS

## 2020-11-23 MED ORDER — DAPAGLIFLOZIN PROPANEDIOL 5 MG PO TABS
5.0000 mg | ORAL_TABLET | Freq: Every day | ORAL | Status: DC
  Administered 2020-11-24: 5 mg via ORAL
  Filled 2020-11-23 (×2): qty 1

## 2020-11-23 MED ORDER — PROPOFOL 10 MG/ML IV BOLUS
INTRAVENOUS | Status: DC | PRN
  Administered 2020-11-23: 150 mg via INTRAVENOUS

## 2020-11-23 MED ORDER — VANCOMYCIN HCL IN DEXTROSE 1-5 GM/200ML-% IV SOLN
1000.0000 mg | Freq: Two times a day (BID) | INTRAVENOUS | Status: AC
Start: 2020-11-23 — End: 2020-11-23
  Administered 2020-11-23: 1000 mg via INTRAVENOUS
  Filled 2020-11-23: qty 200

## 2020-11-23 MED ORDER — BUPIVACAINE LIPOSOME 1.3 % IJ SUSP
INTRAMUSCULAR | Status: DC | PRN
  Administered 2020-11-23: 20 mL

## 2020-11-23 MED ORDER — TICAGRELOR 90 MG PO TABS
90.0000 mg | ORAL_TABLET | Freq: Two times a day (BID) | ORAL | Status: DC
  Filled 2020-11-23 (×2): qty 1

## 2020-11-23 MED ORDER — SUCCINYLCHOLINE CHLORIDE 200 MG/10ML IV SOSY
PREFILLED_SYRINGE | INTRAVENOUS | Status: DC | PRN
  Administered 2020-11-23: 100 mg via INTRAVENOUS

## 2020-11-23 MED ORDER — BUPIVACAINE-EPINEPHRINE 0.25% -1:200000 IJ SOLN
INTRAMUSCULAR | Status: DC | PRN
  Administered 2020-11-23: 30 mL

## 2020-11-23 MED ORDER — TRANEXAMIC ACID-NACL 1000-0.7 MG/100ML-% IV SOLN
1000.0000 mg | INTRAVENOUS | Status: AC
Start: 2020-11-23 — End: 2020-11-23
  Administered 2020-11-23: 1000 mg via INTRAVENOUS
  Filled 2020-11-23: qty 100

## 2020-11-23 MED ORDER — CEFAZOLIN SODIUM-DEXTROSE 2-4 GM/100ML-% IV SOLN
2.0000 g | INTRAVENOUS | Status: AC
  Administered 2020-11-23: 2 g via INTRAVENOUS
  Filled 2020-11-23: qty 100

## 2020-11-23 MED ORDER — ONDANSETRON HCL 4 MG/2ML IJ SOLN
INTRAMUSCULAR | Status: AC
  Filled 2020-11-23: qty 2

## 2020-11-23 MED ORDER — CEFUROXIME SODIUM 1.5 G IV SOLR
INTRAVENOUS | Status: AC
  Filled 2020-11-23: qty 1.5

## 2020-11-23 MED ORDER — METOCLOPRAMIDE HCL 5 MG/ML IJ SOLN: 5.0000 mg | Freq: Three times a day (TID) | INTRAMUSCULAR | Status: DC | PRN

## 2020-11-23 MED ORDER — PROPOFOL 10 MG/ML IV BOLUS
INTRAVENOUS | Status: AC
  Filled 2020-11-23: qty 20

## 2020-11-23 MED ORDER — DEXAMETHASONE SODIUM PHOSPHATE 10 MG/ML IJ SOLN
INTRAMUSCULAR | Status: AC
  Filled 2020-11-23: qty 1

## 2020-11-23 MED ORDER — ACETAMINOPHEN 325 MG PO TABS
325.0000 mg | ORAL_TABLET | Freq: Four times a day (QID) | ORAL | Status: DC | PRN
  Administered 2020-11-24: 650 mg via ORAL
  Filled 2020-11-23: qty 2

## 2020-11-23 MED ORDER — FENTANYL CITRATE (PF) 100 MCG/2ML IJ SOLN: 25.0000 ug | INTRAMUSCULAR | Status: DC | PRN

## 2020-11-23 MED ORDER — WATER FOR IRRIGATION, STERILE IR SOLN
Status: DC | PRN
  Administered 2020-11-23: 2000 mL

## 2020-11-23 MED ORDER — LACTATED RINGERS IV SOLN: INTRAVENOUS | Status: DC

## 2020-11-23 MED ORDER — DIPHENHYDRAMINE HCL 12.5 MG/5ML PO ELIX
12.5000 mg | ORAL_SOLUTION | ORAL | Status: DC | PRN
  Administered 2020-11-23: 25 mg via ORAL
  Administered 2020-11-23 (×2): 12.5 mg via ORAL
  Filled 2020-11-23 (×2): qty 5
  Filled 2020-11-23: qty 10

## 2020-11-23 MED ORDER — METOCLOPRAMIDE HCL 5 MG PO TABS: 5.0000 mg | ORAL_TABLET | Freq: Three times a day (TID) | ORAL | Status: DC | PRN

## 2020-11-23 MED ORDER — LIDOCAINE 2% (20 MG/ML) 5 ML SYRINGE
INTRAMUSCULAR | Status: AC
  Filled 2020-11-23: qty 5

## 2020-11-23 MED ORDER — ROPIVACAINE HCL 7.5 MG/ML IJ SOLN
INTRAMUSCULAR | Status: DC | PRN
  Administered 2020-11-23: 20 mL via PERINEURAL

## 2020-11-23 MED ORDER — BUPIVACAINE-EPINEPHRINE (PF) 0.25% -1:200000 IJ SOLN
INTRAMUSCULAR | Status: AC
  Filled 2020-11-23: qty 30

## 2020-11-23 MED ORDER — FENTANYL CITRATE (PF) 100 MCG/2ML IJ SOLN
INTRAMUSCULAR | Status: AC
  Filled 2020-11-23: qty 2

## 2020-11-23 MED ORDER — POVIDONE-IODINE 10 % EX SWAB: 2.0000 "application " | Freq: Once | CUTANEOUS | Status: DC

## 2020-11-23 MED ORDER — GLYCOPYRROLATE PF 0.2 MG/ML IJ SOSY
PREFILLED_SYRINGE | INTRAMUSCULAR | Status: AC
  Filled 2020-11-23: qty 1

## 2020-11-23 MED ORDER — LIDOCAINE 2% (20 MG/ML) 5 ML SYRINGE
INTRAMUSCULAR | Status: DC | PRN
  Administered 2020-11-23: 60 mg via INTRAVENOUS

## 2020-11-23 MED ORDER — POVIDONE-IODINE 10 % EX SWAB
2.0000 "application " | Freq: Once | CUTANEOUS | Status: AC
  Administered 2020-11-23: 2 via TOPICAL

## 2020-11-23 MED ORDER — HYDROMORPHONE HCL 2 MG PO TABS
2.0000 mg | ORAL_TABLET | ORAL | Status: DC | PRN
Start: 2020-11-23 — End: 2020-11-24
  Administered 2020-11-23 – 2020-11-24 (×5): 2 mg via ORAL
  Filled 2020-11-23 (×5): qty 1

## 2020-11-23 MED ORDER — DEXAMETHASONE SODIUM PHOSPHATE 10 MG/ML IJ SOLN
8.0000 mg | Freq: Once | INTRAMUSCULAR | Status: AC
  Administered 2020-11-23: 8 mg via INTRAVENOUS

## 2020-11-23 MED ORDER — PHENOL 1.4 % MT LIQD: 1.0000 | OROMUCOSAL | Status: DC | PRN

## 2020-11-23 MED ORDER — MECLIZINE HCL 25 MG PO TABS: 25.0000 mg | ORAL_TABLET | Freq: Three times a day (TID) | ORAL | Status: DC | PRN

## 2020-11-23 MED ORDER — ONDANSETRON HCL 4 MG PO TABS: 4.0000 mg | ORAL_TABLET | Freq: Four times a day (QID) | ORAL | Status: DC | PRN

## 2020-11-23 MED ORDER — CARVEDILOL 25 MG PO TABS
25.0000 mg | ORAL_TABLET | Freq: Two times a day (BID) | ORAL | Status: DC
  Administered 2020-11-23 – 2020-11-24 (×2): 25 mg via ORAL
  Filled 2020-11-23 (×2): qty 1

## 2020-11-23 MED ORDER — TRANEXAMIC ACID-NACL 1000-0.7 MG/100ML-% IV SOLN
1000.0000 mg | Freq: Once | INTRAVENOUS | Status: AC
  Administered 2020-11-23: 1000 mg via INTRAVENOUS
  Filled 2020-11-23: qty 100

## 2020-11-23 MED ORDER — EPHEDRINE SULFATE-NACL 50-0.9 MG/10ML-% IV SOSY
PREFILLED_SYRINGE | INTRAVENOUS | Status: DC | PRN
  Administered 2020-11-23 (×2): 10 mg via INTRAVENOUS

## 2020-11-23 MED ORDER — PROPOFOL 1000 MG/100ML IV EMUL
INTRAVENOUS | Status: AC
  Filled 2020-11-23: qty 100

## 2020-11-23 MED ORDER — EPHEDRINE 5 MG/ML INJ
INTRAVENOUS | Status: AC
  Filled 2020-11-23: qty 10

## 2020-11-23 MED ORDER — FENTANYL CITRATE (PF) 100 MCG/2ML IJ SOLN
INTRAMUSCULAR | Status: DC | PRN
  Administered 2020-11-23 (×3): 50 ug via INTRAVENOUS

## 2020-11-23 MED ORDER — MENTHOL 3 MG MT LOZG
1.0000 | LOZENGE | OROMUCOSAL | Status: DC | PRN
  Filled 2020-11-23: qty 9

## 2020-11-23 MED ORDER — SODIUM CHLORIDE 0.9 % IR SOLN
Status: DC | PRN
  Administered 2020-11-23: 2000 mL

## 2020-11-23 MED ORDER — POLYETHYLENE GLYCOL 3350 17 G PO PACK
17.0000 g | PACK | Freq: Two times a day (BID) | ORAL | Status: DC
  Administered 2020-11-23 – 2020-11-24 (×2): 17 g via ORAL
  Filled 2020-11-23 (×2): qty 1

## 2020-11-23 MED ORDER — POTASSIUM CHLORIDE IN NACL 20-0.9 MEQ/L-% IV SOLN
INTRAVENOUS | Status: DC
  Filled 2020-11-23 (×3): qty 1000

## 2020-11-23 MED ORDER — ALBUTEROL SULFATE HFA 108 (90 BASE) MCG/ACT IN AERS
INHALATION_SPRAY | RESPIRATORY_TRACT | Status: DC | PRN
  Administered 2020-11-23 (×2): 4 via RESPIRATORY_TRACT

## 2020-11-23 MED ORDER — MORPHINE SULFATE (PF) 2 MG/ML IV SOLN
2.0000 mg | INTRAVENOUS | Status: DC | PRN
  Administered 2020-11-23: 2 mg via INTRAVENOUS
  Administered 2020-11-24: 4 mg via INTRAVENOUS
  Filled 2020-11-23: qty 2
  Filled 2020-11-23: qty 1

## 2020-11-23 MED ORDER — SODIUM CHLORIDE (PF) 0.9 % IJ SOLN
INTRAMUSCULAR | Status: DC | PRN
  Administered 2020-11-23: 50 mL

## 2020-11-23 MED ORDER — ALUM & MAG HYDROXIDE-SIMETH 200-200-20 MG/5ML PO SUSP: 30.0000 mL | ORAL | Status: DC | PRN

## 2020-11-23 MED ORDER — MIDAZOLAM HCL 2 MG/2ML IJ SOLN
INTRAMUSCULAR | Status: DC | PRN
  Administered 2020-11-23: 2 mg via INTRAVENOUS

## 2020-11-23 MED ORDER — NITROGLYCERIN 0.4 MG SL SUBL: 0.4000 mg | SUBLINGUAL_TABLET | SUBLINGUAL | Status: DC | PRN

## 2020-11-23 MED ORDER — ASPIRIN EC 325 MG PO TBEC: 325.0000 mg | DELAYED_RELEASE_TABLET | Freq: Every day | ORAL | Status: DC

## 2020-11-23 MED ORDER — POVIDONE-IODINE 7.5 % EX SOLN: Freq: Once | CUTANEOUS | Status: DC

## 2020-11-23 MED ORDER — ONDANSETRON HCL 4 MG/2ML IJ SOLN
INTRAMUSCULAR | Status: DC | PRN
  Administered 2020-11-23: 4 mg via INTRAVENOUS

## 2020-11-23 MED ORDER — DEXAMETHASONE SODIUM PHOSPHATE 10 MG/ML IJ SOLN
10.0000 mg | Freq: Three times a day (TID) | INTRAMUSCULAR | Status: AC
  Administered 2020-11-23: 10 mg via INTRAVENOUS
  Filled 2020-11-23: qty 1

## 2020-11-23 MED ORDER — SUCCINYLCHOLINE CHLORIDE 200 MG/10ML IV SOSY
PREFILLED_SYRINGE | INTRAVENOUS | Status: AC
  Filled 2020-11-23: qty 10

## 2020-11-23 MED ORDER — ONDANSETRON HCL 4 MG/2ML IJ SOLN: 4.0000 mg | Freq: Four times a day (QID) | INTRAMUSCULAR | Status: DC | PRN

## 2020-11-23 MED ORDER — SODIUM CHLORIDE (PF) 0.9 % IJ SOLN
INTRAMUSCULAR | Status: AC
  Filled 2020-11-23: qty 50

## 2020-11-23 MED ORDER — ACETAMINOPHEN 500 MG PO TABS
1000.0000 mg | ORAL_TABLET | Freq: Four times a day (QID) | ORAL | Status: AC
  Administered 2020-11-23 – 2020-11-24 (×3): 1000 mg via ORAL
  Filled 2020-11-23 (×3): qty 2

## 2020-11-23 MED ORDER — DOCUSATE SODIUM 100 MG PO CAPS
100.0000 mg | ORAL_CAPSULE | Freq: Two times a day (BID) | ORAL | Status: DC
  Administered 2020-11-23 – 2020-11-24 (×2): 100 mg via ORAL
  Filled 2020-11-23 (×2): qty 1

## 2020-11-23 MED ORDER — 0.9 % SODIUM CHLORIDE (POUR BTL) OPTIME
TOPICAL | Status: DC | PRN
  Administered 2020-11-23: 1000 mL

## 2020-11-23 MED ORDER — MIDAZOLAM HCL 2 MG/2ML IJ SOLN
INTRAMUSCULAR | Status: AC
  Filled 2020-11-23: qty 2

## 2020-11-23 MED ORDER — PROMETHAZINE HCL 25 MG/ML IJ SOLN: 6.2500 mg | INTRAMUSCULAR | Status: DC | PRN

## 2020-11-23 SURGICAL SUPPLY — 59 items
APL PRP STRL LF DISP 70% ISPRP (MISCELLANEOUS) ×2
ATTUNE MED DOME PAT 32 KNEE (Knees) ×1 IMPLANT
ATTUNE PS FEM LT SZ 4 CEM KNEE (Femur) ×1 IMPLANT
ATTUNE PSRP INSR SZ4 7 KNEE (Insert) ×1 IMPLANT
BAG SPEC THK2 15X12 ZIP CLS (MISCELLANEOUS) ×1
BAG ZIPLOCK 12X15 (MISCELLANEOUS) ×2 IMPLANT
BASEPLATE TIBIAL ROTATING SZ 4 (Knees) ×1 IMPLANT
BLADE SAGITTAL 25.0X1.19X90 (BLADE) ×2 IMPLANT
BLADE SAW SGTL 13X75X1.27 (BLADE) ×2 IMPLANT
BLADE SURG SZ10 CARB STEEL (BLADE) ×5 IMPLANT
BOWL SMART MIX CTS (DISPOSABLE) ×2 IMPLANT
BSPLAT TIB 4 CMNT ROT PLAT STR (Knees) ×1 IMPLANT
CEMENT HV SMART SET (Cement) ×4 IMPLANT
CHLORAPREP W/TINT 26 (MISCELLANEOUS) ×4 IMPLANT
CLSR STERI-STRIP ANTIMIC 1/2X4 (GAUZE/BANDAGES/DRESSINGS) ×1 IMPLANT
COVER SURGICAL LIGHT HANDLE (MISCELLANEOUS) ×2 IMPLANT
COVER WAND RF STERILE (DRAPES) IMPLANT
CUFF TOURN SGL QUICK 34 (TOURNIQUET CUFF) ×2
CUFF TRNQT CYL 34X4.125X (TOURNIQUET CUFF) ×1 IMPLANT
DECANTER SPIKE VIAL GLASS SM (MISCELLANEOUS) ×4 IMPLANT
DRAPE ORTHO SPLIT 77X108 STRL (DRAPES) ×2
DRAPE SHEET LG 3/4 BI-LAMINATE (DRAPES) ×2 IMPLANT
DRAPE SURG ORHT 6 SPLT 77X108 (DRAPES) IMPLANT
DRAPE U-SHAPE 47X51 STRL (DRAPES) ×2 IMPLANT
DRSG AQUACEL AG ADV 3.5X10 (GAUZE/BANDAGES/DRESSINGS) ×2 IMPLANT
DRSG PAD ABDOMINAL 8X10 ST (GAUZE/BANDAGES/DRESSINGS) ×4 IMPLANT
ELECT REM PT RETURN 15FT ADLT (MISCELLANEOUS) ×2 IMPLANT
GLOVE SS BIOGEL STRL SZ 7.5 (GLOVE) ×1 IMPLANT
GLOVE SUPERSENSE BIOGEL SZ 7.5 (GLOVE) ×1
GLOVE SURG ENC MOIS LTX SZ7 (GLOVE) ×2 IMPLANT
GLOVE SURG UNDER POLY LF SZ7 (GLOVE) ×2 IMPLANT
GLOVE SURG UNDER POLY LF SZ7.5 (GLOVE) ×2 IMPLANT
GOWN STRL REUS W/ TWL LRG LVL3 (GOWN DISPOSABLE) ×2 IMPLANT
GOWN STRL REUS W/TWL LRG LVL3 (GOWN DISPOSABLE) ×4
HANDPIECE INTERPULSE COAX TIP (DISPOSABLE) ×2
HOLDER FOLEY CATH W/STRAP (MISCELLANEOUS) IMPLANT
HOOD PEEL AWAY FLYTE STAYCOOL (MISCELLANEOUS) ×6 IMPLANT
KIT TURNOVER KIT A (KITS) ×2 IMPLANT
MANIFOLD NEPTUNE II (INSTRUMENTS) ×2 IMPLANT
MARKER SKIN DUAL TIP RULER LAB (MISCELLANEOUS) ×2 IMPLANT
NDL SAFETY ECLIPSE 18X1.5 (NEEDLE) ×1 IMPLANT
NEEDLE HYPO 18GX1.5 SHARP (NEEDLE) ×2
NS IRRIG 1000ML POUR BTL (IV SOLUTION) ×2 IMPLANT
PACK TOTAL KNEE CUSTOM (KITS) ×2 IMPLANT
PENCIL SMOKE EVACUATOR (MISCELLANEOUS) ×2 IMPLANT
PIN DRILL FIX HALF THREAD (BIT) ×1 IMPLANT
PIN STEINMAN FIXATION KNEE (PIN) ×1 IMPLANT
PROTECTOR NERVE ULNAR (MISCELLANEOUS) ×2 IMPLANT
SET HNDPC FAN SPRY TIP SCT (DISPOSABLE) ×1 IMPLANT
STAPLER VISISTAT 35W (STAPLE) IMPLANT
STRIP CLOSURE SKIN 1/2X4 (GAUZE/BANDAGES/DRESSINGS) ×2 IMPLANT
SUT MNCRL AB 3-0 PS2 18 (SUTURE) ×2 IMPLANT
SUT VIC AB 0 CT1 36 (SUTURE) ×2 IMPLANT
SUT VIC AB 1 CT1 36 (SUTURE) ×2 IMPLANT
SUT VIC AB 2-0 CT1 27 (SUTURE) ×4
SUT VIC AB 2-0 CT1 TAPERPNT 27 (SUTURE) ×2 IMPLANT
SYR CONTROL 10ML LL (SYRINGE) ×6 IMPLANT
TRAY FOLEY MTR SLVR 14FR STAT (SET/KITS/TRAYS/PACK) ×2 IMPLANT
WATER STERILE IRR 1000ML POUR (IV SOLUTION) ×4 IMPLANT

## 2020-11-23 NOTE — Anesthesia Postprocedure Evaluation (Signed)
Anesthesia Post Note  Patient: [name]  Procedure(s) Performed: TOTAL KNEE ARTHROPLASTY (Left Knee)     Patient location during evaluation: PACU Anesthesia Type: General Level of consciousness: awake and alert Pain management: pain level controlled Vital Signs Assessment: post-procedure vital signs reviewed and stable Respiratory status: spontaneous breathing, respiratory function stable and nonlabored ventilation Cardiovascular status: blood pressure returned to baseline and stable Postop Assessment: no apparent nausea or vomiting Anesthetic complications: no   No complications documented.  Last Vitals:  Vitals:   11/23/20 1015 11/23/20 1030  BP: 105/61 110/65  Pulse: 69 69  Resp: 20 19  Temp:  36.6 C  SpO2: 93% 94%    Last Pain:  Vitals:   11/23/20 1030  TempSrc:   PainSc: 0-No pain                 Beryle Lathe

## 2020-11-23 NOTE — Interval H&P Note (Signed)
History and Physical Interval Note:  11/23/2020 6:45 AM  Andrea Robertson  has presented today for surgery, with the diagnosis of djd left knee.  The various methods of treatment have been discussed with the patient and family. After consideration of risks, benefits and other options for treatment, the patient has consented to  Procedure(s): TOTAL KNEE ARTHROPLASTY (Left) as a surgical intervention.  The patient's history has been reviewed, patient examined, no change in status, stable for surgery.  I have reviewed the patient's chart and labs.  Questions were answered to the patient's satisfaction.     Nilda Simmer

## 2020-11-23 NOTE — OR Nursing (Signed)
Contacted PA she said to leave just the ABD in place with no ace wrap.  If draining occurs then to contact them.

## 2020-11-23 NOTE — Anesthesia Procedure Notes (Signed)
Procedure Name: Intubation Date/Time: 11/23/2020 8:02 AM Performed by: Niel Hummer, CRNA Pre-anesthesia Checklist: Patient identified, Emergency Drugs available, Suction available and Patient being monitored Oxygen Delivery Method: Circle system utilized Laryngoscope Size: Mac and 3 Grade View: Grade I Tube type: Oral Tube size: 7.0 mm Number of attempts: 1 Airway Equipment and Method: Stylet Placement Confirmation: ETT inserted through vocal cords under direct vision,  positive ETCO2 and breath sounds checked- equal and bilateral Secured at: 21 cm Tube secured with: Tape Dental Injury: Teeth and Oropharynx as per pre-operative assessment

## 2020-11-23 NOTE — Op Note (Signed)
MRN:     [id number] DOB/AGE:    October 31, 1960 / 60 y.o.       OPERATIVE REPORT   DATE OF PROCEDURE:  11/23/2020      PREOPERATIVE DIAGNOSIS:   Primary Localized Osteoarthritis left Knee       Estimated body mass index is 35.33 kg/m as calculated from the following:   Height as of this encounter: 5\' 1"  (1.549 m).   Weight as of this encounter: 84.8 kg.                                                       POSTOPERATIVE DIAGNOSIS:   Same                                                                 PROCEDURE:  Procedure(s): TOTAL KNEE ARTHROPLASTY Using Depuy Attune RP implants #4 Femur, #4Tibia, 71mm  RP bearing, 32 Patella    SURGEON: Ariana Juul A. 11m, MD   ASSISTANT: Thurston Hole, PA-C, present and scrubbed throughout the case, critical for retraction, instrumentation, and closure.  ANESTHESIA: GET with Adductor Nerve Block  TOURNIQUET TIME: 60 minutes   COMPLICATIONS:  None       SPECIMENS: None   INDICATIONS FOR PROCEDURE: The patient has djd of the knee with varus deformities, XR shows bone on bone arthritis. Patient has failed all conservative measures including anti-inflammatory medicines, narcotics, attempts at exercise and weight loss, cortisone injections and viscosupplementation.  Risks and benefits of surgery have been discussed, questions answered.    DESCRIPTION OF PROCEDURE: The patient identified by armband, received right adductor canal block and IV antibiotics, in the holding area at [facility]. Patient taken to the operating room, appropriate anesthetic monitors were attached. GET anesthesia induced with the patient in supine position, Foley catheter was inserted. Tourniquet applied high to the operative thigh. Lateral post and foot positioner applied to the table, the lower extremity was then prepped and draped in usual sterile fashion from the ankle to the tourniquet. Time-out procedure was performed. The limb was wrapped with an Esmarch bandage and the  tourniquet inflated to 365 mmHg.   We began the operation by making a 6cm anterior midline incision. Small bleeders in the skin and the subcutaneous tissue identified and cauterized. Transverse retinaculum was incised and reflected medially and a medial parapatellar arthrotomy was accomplished. the patella was everted and theprepatellar fat pad resected. The superficial medial collateral ligament was then elevated from anterior to posterior along the proximal flare of the tibia and anterior half of the menisci resected. The knee was hyperflexed exposing bone on bone arthritis. Peripheral and notch osteophytes as well as the cruciate ligaments were then resected. We continued to work our way around posteriorly along the proximal tibia, and externally rotated the tibia subluxing it out from underneath the femur. A McHale retractor was placed through the notch and a lateral Hohmann retractor placed, and an external tibial guide was placed.  The tibial cutting guide was pinned into place allowing resection of 4 mm of bone medially and about 6 mm of bone laterally because of her varus  deformity.   Satisfied with the tibial resection, we then entered the distal femur 2 mm anterior to the PCL origin with the intramedullary guide rod and applied the distal femoral cutting guide set at 75mm, with 5 degrees of valgus. This was pinned along the epicondylar axis. At this point, the distal femoral cut was accomplished without difficulty. We then sized for a 4 femoral component and pinned the guide in 3 degrees of external rotation.The chamfer cutting guide was pinned into place. The anterior, posterior, and chamfer cuts were accomplished without difficulty followed by the  RP box cutting guide and the box cut. We also removed posterior osteophytes from the posterior femoral condyles. At this time, the knee was brought into full extension. We checked our extension and flexion gaps and found them symmetric at 7.  The patella  thickness measured at 13m m. We set the cutting guide at 15 and removed the posterior patella sized for 32 button and drilled the lollipop. The knee was then once again hyperflexed exposing the proximal tibia. We sized for a # 4 tibial base plate, applied the smokestack and the conical reamer followed by the the Delta fin keel punch. We then hammered into place the  RP trial femoral component, inserted a trial bearing, trial patellar button, and took the knee through range of motion from 0-130 degrees. No thumb pressure was required for patellar tracking.   At this point, all trial components were removed, a double batch of DePuy HV cement with Zinacef 1.5gms  was mixed and applied to all bony metallic mating surfaces. In order, we hammered into place the tibial tray and removed excess cement, the femoral component and removed excess cement, a 7 mm  RP bearing was inserted, and the knee brought to full extension with compression. The patellar button was clamped into place, and excess cement removed. While the cement cured the wound was irrigated out with normal saline solution pulse lavage, and exparel was injected throughout the knee. Ligament stability and patellar tracking were checked and found to be excellent..   The parapatellar arthrotomy was closed with  #1 Vicryl suture. The subcutaneous tissue with 0 and 2-0 undyed Vicryl suture, and 4-0 Monocryl.. A dressing of Aquaseal, 4 x 4, dressing sponges, Webril, and Ace wrap applied. Needle and sponge count were correct times 2.The patient awakened, extubated, and taken to recovery room without difficulty. Vascular status was normal, pulses 2+ and symmetric.    Nilda Simmer 11/13/2017, 8:56 AM

## 2020-11-23 NOTE — Progress Notes (Signed)
Orthopedic Tech Progress Note Patient Details:  Andrea Robertson [birth year] [id number]  CPM Left Knee CPM Left Knee: On Left Knee Flexion (Degrees): 90 Left Knee Extension (Degrees): 0  Post Interventions Patient Tolerated: Well Instructions Provided: Care of device  Saul Fordyce 11/23/2020, 9:51 AM

## 2020-11-23 NOTE — Transfer of Care (Signed)
Immediate Anesthesia Transfer of Care Note  Patient: Andrea Robertson  Procedure(s) Performed: TOTAL KNEE ARTHROPLASTY (Left Knee)  Patient Location: PACU  Anesthesia Type:General  Level of Consciousness: awake  Airway & Oxygen Therapy: Patient Spontanous Breathing and Patient connected to face mask oxygen  Post-op Assessment: Report given to RN and Post -op Vital signs reviewed and stable  Post vital signs: Reviewed and stable  Last Vitals:  Vitals Value Taken Time  BP 118/69 11/23/20 0938  Temp    Pulse 65 11/23/20 0940  Resp 16 11/23/20 0940  SpO2 92 % 11/23/20 0940  Vitals shown include unvalidated device data.  Last Pain:  Vitals:   11/23/20 0551  TempSrc: Oral  PainSc: 0-No pain         Complications: No complications documented.

## 2020-11-23 NOTE — Anesthesia Procedure Notes (Signed)
Procedure Name: LMA Insertion Date/Time: 11/23/2020 7:28 AM Performed by: Nelle Don, CRNA Pre-anesthesia Checklist: Patient identified, Emergency Drugs available, Suction available and Patient being monitored Patient Re-evaluated:Patient Re-evaluated prior to induction Oxygen Delivery Method: Circle system utilized Preoxygenation: Pre-oxygenation with 100% oxygen Induction Type: IV induction LMA: LMA inserted LMA Size: 4.0 Number of attempts: 1 Dental Injury: Teeth and Oropharynx as per pre-operative assessment

## 2020-11-23 NOTE — Evaluation (Signed)
Physical Therapy Evaluation Patient Details Name: Anthonia Monger MRN: [id number] DOB: [birth year] Today's Date: 11/23/2020   History of Present Illness  Pt is 60 yo female s/p L TKA on 11/23/20 with general anesthesia and adductor canal block.  She has PMH including vertigo, R TKA, MI, GERD, DM, HTN, CAD, and arthritis.  Clinical Impression  Pt is s/p TKA resulting in the deficits listed below (see PT Problem List). Pt limited at evaluation due to lethargy (noted general anesthia per op note).  She did have good pain control, quad activation, and ROM for DOS.  Pt transferring and ambulating 15' with min guard-min A.  Pt motivated and expected to progress well.  She has had prior TKA on the right, so is familiar with the rehab process.  Pt has good support and necessary DME at home.  Pt will benefit from skilled PT to increase their independence and safety with mobility to allow discharge to the venue listed below.      Follow Up Recommendations Follow surgeon's recommendation for DC plan and follow-up therapies;Supervision for mobility/OOB    Equipment Recommendations  None recommended by PT (has DME)    Recommendations for Other Services       Precautions / Restrictions Precautions Precautions: Fall Restrictions Weight Bearing Restrictions: Yes LLE Weight Bearing: Weight bearing as tolerated      Mobility  Bed Mobility Overal bed mobility: Needs Assistance Bed Mobility: Supine to Sit;Sit to Supine     Supine to sit: Min guard Sit to supine: Min assist   General bed mobility comments: Min A for L LE back to bed    Transfers Overall transfer level: Needs assistance Equipment used: Rolling walker (2 wheeled) Transfers: Sit to/from Stand Sit to Stand: Min guard         General transfer comment: cues for hand placement to push up and for L LE managment  Ambulation/Gait Ambulation/Gait assistance: Min guard Gait Distance (Feet): 15 Feet Assistive device: Rolling  walker (2 wheeled) Gait Pattern/deviations: Step-to pattern;Decreased stride length;Decreased weight shift to left Gait velocity: decreased   General Gait Details: Cues for sequencing and RW proximity.  Ambulated to door with chair follow - further ambulation held due to lethargy, pt having difficulty keeping eyes opening and with limited follow through on gait cues due to lethargy  Stairs            Wheelchair Mobility    Modified Rankin (Stroke Patients Only)       Balance Overall balance assessment: Needs assistance   Sitting balance-Leahy Scale: Good     Standing balance support: Bilateral upper extremity supported Standing balance-Leahy Scale: Poor Standing balance comment: Requiring RW but steady with RW                             Pertinent Vitals/Pain Pain Assessment: 0-10 Pain Score: 1  Pain Location: L knee Pain Intervention(s): Limited activity within patient's tolerance;Monitored during session;Premedicated before session (no ice per MD protocol)    Home Living Family/patient expects to be discharged to:: Private residence Living Arrangements: Spouse/significant other Available Help at Discharge: Family;Available 24 hours/day Type of Home: House Home Access: Level entry     Home Layout: Two level;Bed/bath upstairs Home Equipment: Walker - 2 wheels      Prior Function Level of Independence: Independent         Comments: Completely independent with ADLs, IADLs, community ambulation, and driving     Hand  Dominance        Extremity/Trunk Assessment   Upper Extremity Assessment Upper Extremity Assessment: Overall WFL for tasks assessed    Lower Extremity Assessment Lower Extremity Assessment: LLE deficits/detail LLE Deficits / Details: ROM: knee lacks 5 ext, 85 flexion, hip and ankle WFL; MMT : 3/5 throughout not further tested    Cervical / Trunk Assessment Cervical / Trunk Assessment: Normal  Communication    Communication: No difficulties  Cognition Arousal/Alertness: Lethargic;Suspect due to medications (anesthesia) Behavior During Therapy: WFL for tasks assessed/performed Overall Cognitive Status: Within Functional Limits for tasks assessed                                        General Comments General comments (skin integrity, edema, etc.): Educated on PT role, POC, resting with leg straight, setting RW height.  Pt reports likes to sleep on side - discussed on with pillow between knees but not behind/under knees, try to keep leg straight.    Exercises     Assessment/Plan    PT Assessment Patient needs continued PT services  PT Problem List Decreased strength;Decreased mobility;Decreased range of motion;Decreased knowledge of precautions;Decreased activity tolerance;Decreased balance;Decreased knowledge of use of DME;Pain       PT Treatment Interventions DME instruction;Therapeutic activities;Modalities;Gait training;Therapeutic exercise;Patient/family education;Stair training;Balance training;Functional mobility training    PT Goals (Current goals can be found in the Care Plan section)  Acute Rehab PT Goals Patient Stated Goal: return home PT Goal Formulation: With patient Time For Goal Achievement: 12/07/20 Potential to Achieve Goals: Good    Frequency 7X/week   Barriers to discharge        Co-evaluation               AM-PAC PT "6 Clicks" Mobility  Outcome Measure Help needed turning from your back to your side while in a flat bed without using bedrails?: A Little Help needed moving from lying on your back to sitting on the side of a flat bed without using bedrails?: A Little Help needed moving to and from a bed to a chair (including a wheelchair)?: A Little Help needed standing up from a chair using your arms (e.g., wheelchair or bedside chair)?: A Little Help needed to walk in hospital room?: A Little Help needed climbing 3-5 steps with a railing?  : A Little 6 Click Score: 18    End of Session Equipment Utilized During Treatment: Gait belt;Other (comment);Oxygen (O2 off during session with VSS but reapplied 2 L (as she was) due to lethargy) Activity Tolerance: Patient limited by lethargy Patient left: in bed;with call bell/phone within reach;with bed alarm set;with family/visitor present Nurse Communication: Mobility status PT Visit Diagnosis: Other abnormalities of gait and mobility (R26.89);Muscle weakness (generalized) (M62.81);Pain Pain - Right/Left: Left Pain - part of body: Knee    Time: 0093-8182 PT Time Calculation (min) (ACUTE ONLY): 29 min   Charges:   PT Evaluation $PT Eval Low Complexity: 1 Low PT Treatments $Gait Training: 8-22 mins        [name], PT Acute Rehab Services Pager [phone] [name] [phone]    [name] 11/23/2020, 3:18 PM

## 2020-11-23 NOTE — Anesthesia Procedure Notes (Signed)
Anesthesia Regional Block: Adductor canal block   Pre-Anesthetic Checklist: ,, timeout performed, Correct Patient, Correct Site, Correct Laterality, Correct Procedure, Correct Position, site marked, Risks and benefits discussed,  Surgical consent,  Pre-op evaluation,  At surgeon's request and post-op pain management  Laterality: Left  Prep: chloraprep       Needles:  Injection technique: Single-shot  Needle Type: Echogenic Needle     Needle Length: 10cm  Needle Gauge: 21     Additional Needles:   Narrative:  Start time: 11/23/2020 7:03 AM End time: 11/23/2020 7:07 AM Injection made incrementally with aspirations every 5 mL.  Performed by: Personally  Anesthesiologist: Beryle Lathe, MD  Additional Notes: No pain on injection. No increased resistance to injection. Injection made in 5cc increments. Good needle visualization. Patient tolerated the procedure well.

## 2020-11-24 ENCOUNTER — Encounter (HOSPITAL_COMMUNITY): Payer: Self-pay | Admitting: Orthopedic Surgery

## 2020-11-24 DIAGNOSIS — E1122 Type 2 diabetes mellitus with diabetic chronic kidney disease: Secondary | ICD-10-CM | POA: Diagnosis not present

## 2020-11-24 DIAGNOSIS — Z794 Long term (current) use of insulin: Secondary | ICD-10-CM | POA: Diagnosis not present

## 2020-11-24 DIAGNOSIS — Z7982 Long term (current) use of aspirin: Secondary | ICD-10-CM | POA: Diagnosis not present

## 2020-11-24 DIAGNOSIS — N183 Chronic kidney disease, stage 3 unspecified: Secondary | ICD-10-CM | POA: Diagnosis not present

## 2020-11-24 DIAGNOSIS — Z96651 Presence of right artificial knee joint: Secondary | ICD-10-CM | POA: Diagnosis not present

## 2020-11-24 DIAGNOSIS — I130 Hypertensive heart and chronic kidney disease with heart failure and stage 1 through stage 4 chronic kidney disease, or unspecified chronic kidney disease: Secondary | ICD-10-CM | POA: Diagnosis not present

## 2020-11-24 DIAGNOSIS — I5032 Chronic diastolic (congestive) heart failure: Secondary | ICD-10-CM | POA: Diagnosis not present

## 2020-11-24 DIAGNOSIS — I2510 Atherosclerotic heart disease of native coronary artery without angina pectoris: Secondary | ICD-10-CM | POA: Diagnosis not present

## 2020-11-24 DIAGNOSIS — Z79899 Other long term (current) drug therapy: Secondary | ICD-10-CM | POA: Diagnosis not present

## 2020-11-24 DIAGNOSIS — M1712 Unilateral primary osteoarthritis, left knee: Secondary | ICD-10-CM | POA: Diagnosis not present

## 2020-11-24 LAB — CBC
HCT: 34.8 % — ABNORMAL LOW (ref 36.0–46.0)
Hemoglobin: 11.5 g/dL — ABNORMAL LOW (ref 12.0–15.0)
MCH: 27.9 pg (ref 26.0–34.0)
MCHC: 33 g/dL (ref 30.0–36.0)
MCV: 84.5 fL (ref 80.0–100.0)
Platelets: 242 10*3/uL (ref 150–400)
RBC: 4.12 MIL/uL (ref 3.87–5.11)
RDW: 15.4 % (ref 11.5–15.5)
WBC: 19 10*3/uL — ABNORMAL HIGH (ref 4.0–10.5)
nRBC: 0 % (ref 0.0–0.2)

## 2020-11-24 LAB — BASIC METABOLIC PANEL
Anion gap: 9 (ref 5–15)
BUN: 18 mg/dL (ref 6–20)
CO2: 24 mmol/L (ref 22–32)
Calcium: 8.5 mg/dL — ABNORMAL LOW (ref 8.9–10.3)
Chloride: 105 mmol/L (ref 98–111)
Creatinine, Ser: 1.24 mg/dL — ABNORMAL HIGH (ref 0.44–1.00)
GFR, Estimated: 51 mL/min — ABNORMAL LOW (ref >60)
Glucose, Bld: 211 mg/dL — ABNORMAL HIGH (ref 70–99)
Potassium: 3.8 mmol/L (ref 3.5–5.1)
Sodium: 138 mmol/L (ref 135–145)

## 2020-11-24 LAB — GLUCOSE, CAPILLARY
Glucose-Capillary: 137 mg/dL — ABNORMAL HIGH (ref 70–99)
Glucose-Capillary: 130 mg/dL — ABNORMAL HIGH (ref 70–99)

## 2020-11-24 MED ORDER — GABAPENTIN 300 MG PO CAPS: 300.0000 mg | ORAL_CAPSULE | Freq: Every day | ORAL | 0 refills | Status: DC

## 2020-11-24 MED ORDER — POLYETHYLENE GLYCOL 3350 17 G PO PACK: 17.0000 g | PACK | Freq: Two times a day (BID) | ORAL | 0 refills | Status: DC

## 2020-11-24 MED ORDER — ACETAMINOPHEN 500 MG PO TABS: 1000.0000 mg | ORAL_TABLET | Freq: Three times a day (TID) | ORAL | 0 refills | Status: DC | PRN

## 2020-11-24 MED ORDER — DOCUSATE SODIUM 100 MG PO CAPS: 100.0000 mg | ORAL_CAPSULE | Freq: Two times a day (BID) | ORAL | 0 refills | Status: DC

## 2020-11-24 MED ORDER — HYDROMORPHONE HCL 2 MG PO TABS: 2.0000 mg | ORAL_TABLET | ORAL | 0 refills | Status: DC | PRN

## 2020-11-24 NOTE — Plan of Care (Signed)
Patient discharged home in stable condition 

## 2020-11-24 NOTE — Progress Notes (Signed)
Physical Therapy Treatment Patient Details Name: Andrea Robertson MRN: [id number] DOB: [birth year] Today's Date: 11/24/2020    History of Present Illness Pt is 60 yo female s/p L TKA on 11/23/20 with general anesthesia and adductor canal block.  She has PMH including vertigo, R TKA, MI, GERD, DM, HTN, CAD, and arthritis.    PT Comments    Pt is POD # 1 and is progressing well.  Pt demonstrates safe gait & transfers in order to return home from PT perspective once discharged by MD.  While in hospital, will continue to benefit from PT for skilled therapy to advance mobility and exercises.  Pt with good ROM and quad activation. Patient had received pain meds prior to therapy and pain was well controlled with activity. She was able to ambulate and perform stairs necessary to return home with family .      Follow Up Recommendations  Follow surgeon's recommendation for DC plan and follow-up therapies;Supervision for mobility/OOB     Equipment Recommendations  None recommended by PT    Recommendations for Other Services       Precautions / Restrictions Precautions Precautions: Fall Restrictions Weight Bearing Restrictions: No LLE Weight Bearing: Weight bearing as tolerated    Mobility  Bed Mobility Overal bed mobility: Needs Assistance Bed Mobility: Supine to Sit;Sit to Supine     Supine to sit: Supervision Sit to supine: Supervision        Transfers Overall transfer level: Needs assistance Equipment used: Rolling walker (2 wheeled) Transfers: Sit to/from Stand Sit to Stand: Supervision         General transfer comment: Pt demonstrated safe hand placement , cued for L LE management  Ambulation/Gait Ambulation/Gait assistance: Supervision Gait Distance (Feet): 100 Feet (100'x2) Assistive device: Rolling walker (2 wheeled) Gait Pattern/deviations: Step-through pattern;Decreased weight shift to left Gait velocity: decreased   General Gait Details: Cues for RW  proximity with good carry through   Stairs Stairs: Yes Stairs assistance: Min guard Stair Management: Step to pattern;Forwards;Two rails;One rail Left;With cane Number of Stairs: 7 General stair comments: up/down 4 steps with bil rails and min guard; up/down 3 steps with L rail and cane with min guard and cues for sequencing; pt has flight of steps with 2 rails 1/2 way and 1 rail on L next half   Wheelchair Mobility    Modified Rankin (Stroke Patients Only)       Balance Overall balance assessment: Needs assistance   Sitting balance-Leahy Scale: Good     Standing balance support: Bilateral upper extremity supported;No upper extremity supported Standing balance-Leahy Scale: Fair Standing balance comment: RW for gait but able to static stand without AD                            Cognition Arousal/Alertness: Awake/alert Behavior During Therapy: WFL for tasks assessed/performed Overall Cognitive Status: Within Functional Limits for tasks assessed                                 General Comments: Pt was seen by PA (ambulated lap and did stairs) this morning and reports significant pain with mobility as she had not had her pain meds.  Reports pain much more tolerable now that she has had her medication.      Exercises Total Joint Exercises Ankle Circles/Pumps: AROM;Both;10 reps;Supine Quad Sets: AROM;Both;10 reps;Supine Towel Squeeze: AROM;Both;10 reps;Supine Heel Slides:  AAROM;Left;10 reps;Supine (educated on use of gait belt to assist) Hip ABduction/ADduction: AAROM;Supine;Left;10 reps (educated on use of gait belt to assist) Long Arc Quad: AROM;Left;5 reps;Seated Knee Flexion: AROM;Left;10 reps;Seated Goniometric ROM: L knee 0 to 80 degrees    General Comments General comments (skin integrity, edema, etc.): Educated on no pivots, resting with leg straight, HEP, car transfers.      Pertinent Vitals/Pain Pain Assessment: 0-10 Pain Score: 3   Pain Location: L knee Pain Descriptors / Indicators: Sore Pain Intervention(s): Limited activity within patient's tolerance;Monitored during session;Premedicated before session (no ice per MD protocol)    Home Living                      Prior Function            PT Goals (current goals can now be found in the care plan section) Acute Rehab PT Goals Patient Stated Goal: return home PT Goal Formulation: With patient Time For Goal Achievement: 12/07/20 Potential to Achieve Goals: Good Progress towards PT goals: Progressing toward goals    Frequency    7X/week      PT Plan Current plan remains appropriate    Co-evaluation              AM-PAC PT "6 Clicks" Mobility   Outcome Measure  Help needed turning from your back to your side while in a flat bed without using bedrails?: None Help needed moving from lying on your back to sitting on the side of a flat bed without using bedrails?: None Help needed moving to and from a bed to a chair (including a wheelchair)?: A Little Help needed standing up from a chair using your arms (e.g., wheelchair or bedside chair)?: A Little Help needed to walk in hospital room?: A Little Help needed climbing 3-5 steps with a railing? : A Little 6 Click Score: 20    End of Session Equipment Utilized During Treatment: Gait belt Activity Tolerance: Patient tolerated treatment well Patient left: in bed;with call bell/phone within reach;with bed alarm set Nurse Communication: Mobility status PT Visit Diagnosis: Other abnormalities of gait and mobility (R26.89);Muscle weakness (generalized) (M62.81);Pain Pain - Right/Left: Left Pain - part of body: Knee     Time: 1130-1204 PT Time Calculation (min) (ACUTE ONLY): 34 min  Charges:  $Gait Training: 8-22 mins $Therapeutic Exercise: 8-22 mins                     Andrea Robertson, PT Acute Rehab Services Pager [phone] [facility] [phone]     Andrea Robertson 11/24/2020,  12:22 PM

## 2020-11-24 NOTE — Plan of Care (Signed)
  Problem: Pain Management: Goal: Pain level will decrease with appropriate interventions Outcome: Progressing   

## 2020-11-24 NOTE — TOC Transition Note (Signed)
Transition of Care [facility]) - CM/SW Discharge Note   Patient Details  Name: Andrea Robertson MRN: [id number] Date of Birth: April 02, 1961  [facility] CM/SW Contact:  Lennart Pall, LCSW Phone Number: 11/24/2020, 9:23 AM   Clinical Narrative:    Met briefly with pt and confirming she has all needed DME. Plan for OPPT at [place].  No further TOC needs.   Final next level of care: OP Rehab Barriers to Discharge: No Barriers Identified   Patient Goals and CMS Choice Patient states their goals for this hospitalization and ongoing recovery are:: return home      Discharge Placement                       Discharge Plan and Services                DME Arranged: N/A DME Agency: NA                  Social Determinants of Health (SDOH) Interventions     Readmission Risk Interventions No flowsheet data found.

## 2020-11-24 NOTE — Discharge Summary (Signed)
Patient ID: Kourtney Terriquez MRN: [id number] DOB/AGE: Feb 17, 1961 60 y.o.  Admit date: 11/23/2020 Discharge date: 11/24/2020  Admission Diagnoses:  Principal Problem:   Primary localized osteoarthritis of left knee Active Problems:   History of total knee arthroplasty   Essential hypertension   Atherosclerotic heart disease of native coronary artery without angina pectoris   Diastolic CHF, chronic (HCC)   Chronic kidney disease, stage 3 unspecified (HCC)   Obesity   Sickle cell trait (HCC)   S/P total knee arthroplasty, right   Discharge Diagnoses:  Same  Past Medical History:  Diagnosis Date  . AKI (acute kidney injury) (HCC)    stage? 3 not followed by a kidney MD   . Anxiety   . Arthritis   . Coronary artery disease involving native coronary artery of native heart without angina pectoris 07/04/2019  . Depression   . Diabetes mellitus without complication (HCC)    type 2   . Essential hypertension 10/30/2018  . GERD (gastroesophageal reflux disease)   . Hyperlipidemia 07/04/2019  . Myocardial infarction (HCC)   . Primary localized osteoarthritis of left knee   . PVC (premature ventricular contraction) 10/30/2018  . S/P total knee arthroplasty, right   . Shortness of breath    pt denies   . Vertigo 10/30/2018    Surgeries: Procedure(s): TOTAL KNEE ARTHROPLASTY on 11/23/2020   Consultants:   Discharged Condition: Improved  Hospital Course: Marlenne Ridge is an 60 y.o. female who was admitted 11/23/2020 for operative treatment ofPrimary localized osteoarthritis of left knee. Patient has severe unremitting pain that affects sleep, daily activities, and work/hobbies. After pre-op clearance the patient was taken to the operating room on 11/23/2020 and underwent  Procedure(s): TOTAL KNEE ARTHROPLASTY.    Patient was given perioperative antibiotics:  Anti-infectives (From admission, onward)   Start     Dose/Rate Route Frequency Ordered Stop   11/23/20 1200  vancomycin  (VANCOCIN) IVPB 1000 mg/200 mL premix        1,000 mg 200 mL/hr over 60 Minutes Intravenous Every 12 hours 11/23/20 0926 11/23/20 1222   11/23/20 0600  ceFAZolin (ANCEF) IVPB 2g/100 mL premix        2 g 200 mL/hr over 30 Minutes Intravenous On call to O.R. 11/23/20 3086 11/23/20 0758       Patient was given sequential compression devices, early ambulation, and chemoprophylaxis to prevent DVT.  Patient benefited maximally from hospital stay and there were no complications.    Recent vital signs:  Patient Vitals for the past 24 hrs:  BP Temp Temp src Pulse Resp SpO2  11/24/20 0610 137/72 98.1 F (36.7 C) Oral 75 17 95 %  11/24/20 0211 127/79 99.5 F (37.5 C) Oral 96 16 99 %  11/23/20 2205 (!) 153/89 98.3 F (36.8 C) Oral 90 16 97 %  11/23/20 1825 124/69 98.2 F (36.8 C) Oral 96 20 98 %  11/23/20 1303 130/65 97.7 F (36.5 C) -- 78 15 96 %  11/23/20 1054 109/66 -- -- 72 17 98 %  11/23/20 1030 110/65 97.9 F (36.6 C) -- 69 19 94 %  11/23/20 1015 105/61 -- -- 69 20 93 %  11/23/20 1000 111/68 -- -- 69 15 100 %  11/23/20 0945 111/71 -- -- 66 15 94 %  11/23/20 0938 118/69 97.6 F (36.4 C) -- 66 17 93 %     Recent laboratory studies:  Recent Labs    11/24/20 0311  WBC 19.0*  HGB 11.5*  HCT 34.8*  PLT 242  NA 138  K 3.8  CL 105  CO2 24  BUN 18  CREATININE 1.24*  GLUCOSE 211*  CALCIUM 8.5*     Discharge Medications:   Allergies as of 11/24/2020      Reactions   Dilaudid [hydromorphone Hcl] Hives   Can tolerate if premedicated with benadryl   Erythromycin Nausea And Vomiting   Penicillins Hives, Nausea And Vomiting   Has patient had a PCN reaction causing immediate rash, facial/tongue/throat swelling, SOB or lightheadedness with hypotension: No Has patient had a PCN reaction causing severe rash involving mucus membranes or skin necrosis: No Has patient had a PCN reaction that required hospitalization No Has patient had a PCN reaction occurring within the last 10  years: Yes If all of the above answers are "NO", then may proceed with Cephalosporin use. Other reaction(s): hives/vomiting   Percocet [oxycodone-acetaminophen] Itching, Nausea And Vomiting   Can tolerate with premedication of diphenhydramine   Zithromax [azithromycin] Nausea And Vomiting   Cefprozil    Other reaction(s): rash but takes Ceftin   Codeine    Other reaction(s): nausea   Oxycodone    Other reaction(s): nausea, epigastric pain      Medication List    TAKE these medications   Accu-Chek FastClix Lancets Misc USE TO TEST YOUR BLOOD SUGAR TWICE A DAY DX E11.21   acetaminophen 500 MG tablet Commonly known as: TYLENOL Take 2 tablets (1,000 mg total) by mouth every 8 (eight) hours as needed.   amLODipine 5 MG tablet Commonly known as: NORVASC Take 1 tablet (5 mg total) by mouth daily.   aspirin 81 MG EC tablet Take 1 tablet (81 mg total) by mouth daily.   atorvastatin 80 MG tablet Commonly known as: LIPITOR Take 1 tablet (80 mg total) by mouth daily at 6 PM.   buPROPion 300 MG 24 hr tablet Commonly known as: WELLBUTRIN XL Take 300 mg by mouth daily.   carvedilol 25 MG tablet Commonly known as: COREG Take 1 tablet (25 mg total) by mouth 2 (two) times daily.   docusate sodium 100 MG capsule Commonly known as: COLACE Take 1 capsule (100 mg total) by mouth 2 (two) times daily.   escitalopram 10 MG tablet Commonly known as: LEXAPRO Take 10 mg by mouth daily.   Farxiga 5 MG Tabs tablet Generic drug: dapagliflozin propanediol Take 5 mg by mouth daily.   fexofenadine 180 MG tablet Commonly known as: ALLEGRA Take 180 mg by mouth daily.   fluticasone 50 MCG/ACT nasal spray Commonly known as: FLONASE Place 1 spray into both nostrils daily.   gabapentin 300 MG capsule Commonly known as: Neurontin Take 1 capsule (300 mg total) by mouth at bedtime. FOR NERVE PAIN   HYDROmorphone 2 MG tablet Commonly known as: DILAUDID Take 1 tablet (2 mg total) by mouth  every 4 (four) hours as needed for severe pain (PATIENT HAD A TOTAL KNEE REPLACMENT ON 11/23/2020).   meclizine 25 MG tablet Commonly known as: ANTIVERT Take 1 tablet (25 mg total) by mouth 3 (three) times daily as needed for dizziness.   multivitamin with minerals Tabs tablet Take 1 tablet by mouth daily.   nitroGLYCERIN 0.4 MG SL tablet Commonly known as: NITROSTAT Place 1 tablet (0.4 mg total) under the tongue every 5 (five) minutes x 3 doses as needed for chest pain.   polyethylene glycol 17 g packet Commonly known as: MIRALAX / GLYCOLAX Take 17 g by mouth 2 (two) times daily.   ticagrelor 90  MG Tabs tablet Commonly known as: BRILINTA Take 1 tablet (90 mg total) by mouth 2 (two) times daily.            Discharge Care Instructions  (From admission, onward)         Start     Ordered   11/24/20 0000  Change dressing       Comments: DO NOT REMOVE BANDAGE OVER SURGICAL INCISION.  WASH WHOLE LEG INCLUDING OVER THE WATERPROOF BANDAGE WITH SOAP AND WATER EVERY DAY.   11/24/20 0829          Diagnostic Studies: No results found.  Disposition: Discharge disposition: 01-Home or Self Care       Discharge Instructions    CPM   Complete by: As directed    Continuous passive motion machine (CPM):      Use the CPM from 0 to 90 for 6 hours per day.       You may break it up into 2 or 3 sessions per day.      Use CPM for 2 weeks or until you are told to stop.   Call MD / Call 911   Complete by: As directed    If you experience chest pain or shortness of breath, CALL 911 and be transported to the hospital emergency room.  If you develope a fever above 101 F, pus (white drainage) or increased drainage or redness at the wound, or calf pain, call your surgeon's office.   Change dressing   Complete by: As directed    DO NOT REMOVE BANDAGE OVER SURGICAL INCISION.  WASH WHOLE LEG INCLUDING OVER THE WATERPROOF BANDAGE WITH SOAP AND WATER EVERY DAY.   Constipation Prevention    Complete by: As directed    Drink plenty of fluids.  Prune juice may be helpful.  You may use a stool softener, such as Colace (over the counter) 100 mg twice a day.  Use MiraLax (over the counter) for constipation as needed.   Diet - low sodium heart healthy   Complete by: As directed    Discharge instructions   Complete by: As directed    INSTRUCTIONS AFTER JOINT REPLACEMENT   Remove items at home which could result in a fall. This includes throw rugs or furniture in walking pathways You may notice swelling that will progress down to the foot and ankle.  This is normal after surgery.  Elevate your leg when you are not up walking on it.   Continue to use the breathing machine you got in the hospital (incentive spirometer) which will help keep your temperature down.  It is common for your temperature to cycle up and down following surgery, especially at night when you are not up moving around and exerting yourself.  The breathing machine keeps your lungs expanded and your temperature down.   DIET:  As you were doing prior to hospitalization, we recommend a well-balanced diet.  DRESSING / WOUND CARE / SHOWERING  Keep the surgical dressing until follow up.  The dressing is water proof, so you can shower without any extra covering.  IF THE DRESSING FALLS OFF or the wound gets wet inside, change the dressing with sterile gauze.  Please use good hand washing techniques before changing the dressing.  Do not use any lotions or creams on the incision until instructed by your surgeon.    ACTIVITY  Increase activity slowly as tolerated, but follow the weight bearing instructions below.   No driving for 6 weeks or  until further direction given by your physician.  You cannot drive while taking narcotics.  No lifting or carrying greater than 10 lbs. until further directed by your surgeon. Avoid periods of inactivity such as sitting longer than an hour when not asleep. This helps prevent blood clots.   You may return to work once you are authorized by your doctor.     WEIGHT BEARING   Weight bearing as tolerated with assist device (walker, cane, etc) as directed, use it as long as suggested by your surgeon or therapist, typically at least 1-2 weeks.   EXERCISES  Results after joint replacement surgery are often greatly improved when you follow the exercise, range of motion and muscle strengthening exercises prescribed by your doctor. Safety measures are also important to protect the joint from further injury. Any time any of these exercises cause you to have increased pain or swelling, decrease what you are doing until you are comfortable again and then slowly increase them. If you have problems or questions, call your caregiver or physical therapist for advice.   Rehabilitation is important following a joint replacement. After just a few days of immobilization, the muscles of the leg can become weakened and shrink (atrophy).  These exercises are designed to build up the tone and strength of the thigh and leg muscles and to improve motion. Often times heat used for twenty to thirty minutes before working out will loosen up your tissues and help with improving the range of motion but do not use heat for the first two weeks following surgery (sometimes heat can increase post-operative swelling).   These exercises can be done on a training (exercise) mat, on the floor, on a table or on a bed. Use whatever works the best and is most comfortable for you.    Use music or television while you are exercising so that the exercises are a pleasant break in your day. This will make your life better with the exercises acting as a break in your routine that you can look forward to.   Perform all exercises about fifteen times, three times per day or as directed.  You should exercise both the operative leg and the other leg as well.  Exercises include:   Quad Sets - Tighten up the muscle on the front of the  thigh (Quad) and hold for 5-10 seconds.   Straight Leg Raises - With your knee straight (if you were given a brace, keep it on), lift the leg to 60 degrees, hold for 3 seconds, and slowly lower the leg.  Perform this exercise against resistance later as your leg gets stronger.  Leg Slides: Lying on your back, slowly slide your foot toward your buttocks, bending your knee up off the floor (only go as far as is comfortable). Then slowly slide your foot back down until your leg is flat on the floor again.  Angel Wings: Lying on your back spread your legs to the side as far apart as you can without causing discomfort.  Hamstring Strength:  Lying on your back, push your heel against the floor with your leg straight by tightening up the muscles of your buttocks.  Repeat, but this time bend your knee to a comfortable angle, and push your heel against the floor.  You may put a pillow under the heel to make it more comfortable if necessary.   A rehabilitation program following joint replacement surgery can speed recovery and prevent re-injury in the future due to weakened muscles.  Contact your doctor or a physical therapist for more information on knee rehabilitation.  PHYSICAL THERAPY AT DR Sherene Sires OFFICE STARTING 11/25/2020  ARRIVE AT 2:30 FOR 3 PM APPT WITH JAY   CONSTIPATION  Constipation is defined medically as fewer than three stools per week and severe constipation as less than one stool per week.  Even if you have a regular bowel pattern at home, your normal regimen is likely to be disrupted due to multiple reasons following surgery.  Combination of anesthesia, postoperative narcotics, change in appetite and fluid intake all can affect your bowels.   YOU MUST use at least one of the following options; they are listed in order of increasing strength to get the job done.  They are all available over the counter, and you may need to use some, POSSIBLY even all of these options:    Drink plenty of fluids  (prune juice may be helpful) and high fiber foods Colace 100 mg by mouth twice a day  Senokot for constipation as directed and as needed Dulcolax (bisacodyl), take with full glass of water  Miralax (polyethylene glycol) once or twice a day as needed.  If you have tried all these things and are unable to have a bowel movement in the first 3-4 days after surgery call either your surgeon or your primary doctor.    If you experience loose stools or diarrhea, hold the medications until you stool forms back up.  If your symptoms do not get better within 1 week or if they get worse, check with your doctor.  If you experience "the worst abdominal pain ever" or develop nausea or vomiting, please contact the office immediately for further recommendations for treatment.   ITCHING:  If you experience itching with your medications, try taking only a single pain pill, or even half a pain pill at a time.  You can also use Benadryl over the counter for itching or also to help with sleep.   TED HOSE STOCKINGS:  Use stockings on both legs until for at least 4 weeks or as directed by physician office. They may be removed at night for sleeping.  MEDICATIONS:  See your medication summary on the "After Visit Summary" that nursing will review with you.  You may have some home medications which will be placed on hold until you complete the course of blood thinner medication.  It is important for you to complete the blood thinner medication as prescribed.  PRECAUTIONS:  If you experience chest pain or shortness of breath - call 911 immediately for transfer to the hospital emergency department.   If you develop a fever greater that 101 F, purulent drainage from wound, increased redness or drainage from wound, foul odor from the wound/dressing, or calf pain - CONTACT YOUR SURGEON.                                                   FOLLOW-UP APPOINTMENTS:  If you do not already have a post-op appointment, please call the  office for an appointment to be seen by your surgeon.  Guidelines for how soon to be seen are listed in your "After Visit Summary", but are typically between 1-4 weeks after surgery.  OTHER INSTRUCTIONS:   Knee Replacement:  Do not place pillow under knee, focus on keeping the knee straight while resting. CPM instructions:  0-90 degrees, 2 hours in the morning, 2 hours in the afternoon, and 2 hours in the evening. Place foam block, curve side up under heel at all times except when in CPM or when walking.  DO NOT modify, tear, cut, or change the foam block in any way.  POST-OPERATIVE OPIOID TAPER INSTRUCTIONS: It is important to wean off of your opioid medication as soon as possible. If you do not need pain medication after your surgery it is ok to stop day one. Opioids include: Codeine, Hydrocodone(Norco, Vicodin), Oxycodone(Percocet, oxycontin) and hydromorphone amongst others.  Long term and even short term use of opiods can cause: Increased pain response Dependence Constipation Depression Respiratory depression And more.  Withdrawal symptoms can include Flu like symptoms Nausea, vomiting And more Techniques to manage these symptoms Hydrate well Eat regular healthy meals Stay active Use relaxation techniques(deep breathing, meditating, yoga) Do Not substitute Alcohol to help with tapering If you have been on opioids for less than two weeks and do not have pain than it is ok to stop all together.  Plan to wean off of opioids This plan should start within one week post op of your joint replacement. Maintain the same interval or time between taking each dose and first decrease the dose.  Cut the total daily intake of opioids by one tablet each day Next start to increase the time between doses. The last dose that should be eliminated is the evening dose.     MAKE SURE YOU:  Understand these instructions.  Get help right away if you are not doing well or get worse.    Thank you  for letting us be a part of your medical care team.  It is a privilege we respect greatly.  We hope these instructions will help you stay on track for a fast and full recovery!   Do not put a pillow under the knee. Place it under the heel.   Complete by: As directed    Place gray foam block, curve side up under heel at all times except when in CPM or when walking.  DO NOT modify, tear, cut, or change in any way the gray foam block.   Increase activity slowly as tolerated   Complete by: As directed    Patient may shower   Complete by: As directed    Aquacel dressing is water proof    Wash over it and the whole leg with soap and water at the end of your shower   TED hose   Complete by: As directed    Use stockings (TED hose) for 2 weeks on both leg(s).  You may remove them at night for sleeping.       Follow-up Information    [facility] Follow up on 11/25/2020.   Specialty: Orthopedic Surgery Why: arrive at 2:30 for a 3 pm physical therapy appt on 11/25/20 Contact information: [facility] [address] [place] [address] [phone]        Salvatore Marvel, MD Follow up on 12/03/2020.   Specialty: Orthopedic Surgery Why: appt time 4:45 pm for stitches out and xray Contact information: [address] [place] [address] [phone]                Signed: Pascal Lux 11/24/2020, 8:31 AM

## 2020-11-25 DIAGNOSIS — R262 Difficulty in walking, not elsewhere classified: Secondary | ICD-10-CM | POA: Diagnosis not present

## 2020-11-25 DIAGNOSIS — M25462 Effusion, left knee: Secondary | ICD-10-CM | POA: Diagnosis not present

## 2020-11-25 DIAGNOSIS — M25662 Stiffness of left knee, not elsewhere classified: Secondary | ICD-10-CM | POA: Diagnosis not present

## 2020-11-25 DIAGNOSIS — M6281 Muscle weakness (generalized): Secondary | ICD-10-CM | POA: Diagnosis not present

## 2020-11-30 DIAGNOSIS — M6281 Muscle weakness (generalized): Secondary | ICD-10-CM | POA: Diagnosis not present

## 2020-11-30 DIAGNOSIS — R262 Difficulty in walking, not elsewhere classified: Secondary | ICD-10-CM | POA: Diagnosis not present

## 2020-11-30 DIAGNOSIS — M25462 Effusion, left knee: Secondary | ICD-10-CM | POA: Diagnosis not present

## 2020-11-30 DIAGNOSIS — M25662 Stiffness of left knee, not elsewhere classified: Secondary | ICD-10-CM | POA: Diagnosis not present

## 2020-12-01 DIAGNOSIS — M25462 Effusion, left knee: Secondary | ICD-10-CM | POA: Diagnosis not present

## 2020-12-02 DIAGNOSIS — M25662 Stiffness of left knee, not elsewhere classified: Secondary | ICD-10-CM | POA: Diagnosis not present

## 2020-12-02 DIAGNOSIS — M6281 Muscle weakness (generalized): Secondary | ICD-10-CM | POA: Diagnosis not present

## 2020-12-02 DIAGNOSIS — M25562 Pain in left knee: Secondary | ICD-10-CM | POA: Diagnosis not present

## 2020-12-02 DIAGNOSIS — R262 Difficulty in walking, not elsewhere classified: Secondary | ICD-10-CM | POA: Diagnosis not present

## 2020-12-07 DIAGNOSIS — M25662 Stiffness of left knee, not elsewhere classified: Secondary | ICD-10-CM | POA: Diagnosis not present

## 2020-12-07 DIAGNOSIS — M25462 Effusion, left knee: Secondary | ICD-10-CM | POA: Diagnosis not present

## 2020-12-07 DIAGNOSIS — R262 Difficulty in walking, not elsewhere classified: Secondary | ICD-10-CM | POA: Diagnosis not present

## 2020-12-07 DIAGNOSIS — M6281 Muscle weakness (generalized): Secondary | ICD-10-CM | POA: Diagnosis not present

## 2020-12-08 DIAGNOSIS — M25562 Pain in left knee: Secondary | ICD-10-CM | POA: Diagnosis not present

## 2020-12-09 DIAGNOSIS — M6281 Muscle weakness (generalized): Secondary | ICD-10-CM | POA: Diagnosis not present

## 2020-12-09 DIAGNOSIS — M25462 Effusion, left knee: Secondary | ICD-10-CM | POA: Diagnosis not present

## 2020-12-09 DIAGNOSIS — R262 Difficulty in walking, not elsewhere classified: Secondary | ICD-10-CM | POA: Diagnosis not present

## 2020-12-09 DIAGNOSIS — M25662 Stiffness of left knee, not elsewhere classified: Secondary | ICD-10-CM | POA: Diagnosis not present

## 2020-12-15 DIAGNOSIS — N183 Chronic kidney disease, stage 3 unspecified: Secondary | ICD-10-CM | POA: Diagnosis not present

## 2020-12-15 DIAGNOSIS — M6281 Muscle weakness (generalized): Secondary | ICD-10-CM | POA: Diagnosis not present

## 2020-12-15 DIAGNOSIS — E1121 Type 2 diabetes mellitus with diabetic nephropathy: Secondary | ICD-10-CM | POA: Diagnosis not present

## 2020-12-15 DIAGNOSIS — M25622 Stiffness of left elbow, not elsewhere classified: Secondary | ICD-10-CM | POA: Diagnosis not present

## 2020-12-15 DIAGNOSIS — Z0000 Encounter for general adult medical examination without abnormal findings: Secondary | ICD-10-CM | POA: Diagnosis not present

## 2020-12-15 DIAGNOSIS — M25462 Effusion, left knee: Secondary | ICD-10-CM | POA: Diagnosis not present

## 2020-12-15 DIAGNOSIS — R262 Difficulty in walking, not elsewhere classified: Secondary | ICD-10-CM | POA: Diagnosis not present

## 2020-12-18 DIAGNOSIS — M25462 Effusion, left knee: Secondary | ICD-10-CM | POA: Diagnosis not present

## 2020-12-18 DIAGNOSIS — M25662 Stiffness of left knee, not elsewhere classified: Secondary | ICD-10-CM | POA: Diagnosis not present

## 2020-12-18 DIAGNOSIS — M6281 Muscle weakness (generalized): Secondary | ICD-10-CM | POA: Diagnosis not present

## 2020-12-18 DIAGNOSIS — R262 Difficulty in walking, not elsewhere classified: Secondary | ICD-10-CM | POA: Diagnosis not present

## 2020-12-23 DIAGNOSIS — K5720 Diverticulitis of large intestine with perforation and abscess without bleeding: Secondary | ICD-10-CM

## 2020-12-23 HISTORY — DX: Diverticulitis of large intestine with perforation and abscess without bleeding: K57.20

## 2021-02-17 ENCOUNTER — Other Ambulatory Visit: Payer: Self-pay | Admitting: Internal Medicine

## 2021-02-17 ENCOUNTER — Other Ambulatory Visit: Payer: Self-pay

## 2021-02-17 ENCOUNTER — Ambulatory Visit
Admission: RE | Admit: 2021-02-17 | Discharge: 2021-02-17 | Disposition: A | Discharge status: Home / Self Care | Payer: BC Managed Care – PPO | Source: Ambulatory Visit | Attending: Internal Medicine | Admitting: Internal Medicine

## 2021-02-17 DIAGNOSIS — M545 Low back pain, unspecified: Secondary | ICD-10-CM

## 2021-03-08 ENCOUNTER — Telehealth: Payer: Self-pay | Admitting: Cardiology

## 2021-03-08 NOTE — Telephone Encounter (Signed)
The shortness of breath is not continuous and she is not experiencing it at this time. It occurs with activity and at rest randomly. The patient hasn't missed any doses of her medication. The patient wants to note that she had surgery in May for perforation of sigmoid colon due to diverticulitis with colectomy(in [place]) . She missed some mediations at that time and got back on everything about 2 months ago. She has a follow up at [facility] tomorrow for surgery follow up . The incision is tender but she doesn't feel like this is effecting her breathing. No swelling noted. The patient has gained 4 pounds in the last 4 days and feels bloated, 176 pounds current weight. The patient is having regular BM's and has not changed her diet. Current BP 157/91 HR 67. Of note she is getting ready to eat breakfast and take her morning medications. Gave ED precaution. Will forward to MD, PA and RN for advisement.

## 2021-03-08 NOTE — Telephone Encounter (Signed)
Please schedule an office visit for evaluation with Dr. Shari Prows, me or any other APP at any office. Tereso Newcomer, PA-C    03/08/2021 5:17 PM

## 2021-03-08 NOTE — Telephone Encounter (Signed)
Pt c/o Shortness Of Breath: STAT if SOB developed within the last 24 hours or pt is noticeably SOB on the phone  1. Are you currently SOB (can you hear that pt is SOB on the phone)? No   2. How long have you been experiencing SOB? A week  3. Are you SOB when sitting or when up moving around? Both   4. Are you currently experiencing any other symptoms? No

## 2021-03-09 DIAGNOSIS — N179 Acute kidney failure, unspecified: Secondary | ICD-10-CM

## 2021-03-09 DIAGNOSIS — R0602 Shortness of breath: Secondary | ICD-10-CM | POA: Insufficient documentation

## 2021-03-09 HISTORY — DX: Acute kidney failure, unspecified: N17.9

## 2021-03-09 HISTORY — DX: Shortness of breath: R06.02

## 2021-03-09 NOTE — Telephone Encounter (Signed)
S/w pt stated was better but still wanted to be seen. Pt scheduled to see Dr. Shari Prows on Thursday.

## 2021-03-10 NOTE — Progress Notes (Signed)
Cardiology Office Note:    Date:  03/11/2021   ID:  Andrea Robertson, DOB 1961/08/19, MRN [id number]  PCP:  Lorenda Ishihara, MD   [facility] Providers Cardiologist:  Tobias Alexander, MD (Inactive) {  Referring MD: Lorenda Ishihara,*    History of Present Illness:    Andrea Robertson is a 59 y.o. 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 Delton See who now presents to clinic for follow-up.  She last saw Dr. Delton See on 06/30/20 where she was seen prior to knee replacement surgery. Myoview 07/09/20 showed large, severe defect in the mid inferior, mid inferolateral, apical anterior, apical septal, apical inferior, apical lateral and apex location. Given lack of significant ischemia, the patient was cleared for surgery.  Today, the patient states she is doing okay. Recently had to have emergency surgery in [place] on 12/22/20 for diverticulitis with perforation of the small intestine. Required a 12 day hospitalization at that time. She currently is feeling better and has recovered well. No chest pain, palpitations, orthopnea or PND. She has noticed occasional episodes of shortness of breath over the past couple of weeks. Each episode lasts for 30 seconds before resolving. Can happen at rest or with ambulation. Only occurred 2-3 times. She thinks it may be related to the weather and humidity but wanted to ensure she was evaluated from a CV perspective.   Past Medical History:  Diagnosis Date   AKI (acute kidney injury) (HCC)    stage? 3 not followed by a kidney MD    Anxiety    Arthritis    Coronary artery disease involving native coronary artery of native heart without angina pectoris 07/04/2019   Depression    Diabetes mellitus without complication (HCC)    type 2    Essential hypertension 10/30/2018   GERD (gastroesophageal reflux disease)     Hyperlipidemia 07/04/2019   Myocardial infarction [facility])    Primary localized osteoarthritis of left knee    PVC (premature ventricular contraction) 10/30/2018   S/P total knee arthroplasty, right    Shortness of breath    pt denies    Vertigo 10/30/2018    Past Surgical History:  Procedure Laterality Date   ABDOMINAL HYSTERECTOMY     CHOLECYSTECTOMY     CORONARY STENT INTERVENTION N/A 07/03/2019   Procedure: CORONARY STENT INTERVENTION;  Surgeon: Tonny Bollman, MD;  Location[facility] INVASIVE CV LAB;  Service: Cardiovascular;  Laterality: N/A;   LEFT HEART CATH AND CORONARY ANGIOGRAPHY N/A 07/03/2019   Procedure: LEFT HEART CATH AND CORONARY ANGIOGRAPHY;  Surgeon: Tonny Bollman, MD;  Location: [facility] INVASIVE CV LAB;  Service: Cardiovascular;  Laterality: N/A;   TOTAL KNEE ARTHROPLASTY Right 10/13/2015   Procedure: RIGHT TOTAL KNEE ARTHROPLASTY;  Surgeon: Ranee Gosselin, MD;  Location: [facility] ORS;  Service: Orthopedics;  Laterality: Right;   TOTAL KNEE ARTHROPLASTY Left 11/23/2020   Procedure: TOTAL KNEE ARTHROPLASTY;  Surgeon: Salvatore Marvel, MD;  Location: [facility] ORS;  Service: Orthopedics;  Laterality: Left;    Current Medications: Current Meds  Medication Sig   Accu-Chek FastClix Lancets MISC USE TO TEST YOUR BLOOD SUGAR TWICE A DAY DX E11.21   acetaminophen (TYLENOL) 500 MG tablet Take 2 tablets (1,000 mg total) by mouth every 8 (eight) hours as needed.   amLODipine (NORVASC) 5 MG tablet Take 1 tablet (5 mg total) by mouth daily.   aspirin  EC 81 MG EC tablet Take 1 tablet (81 mg total) by mouth daily.   atorvastatin (LIPITOR) 80 MG tablet Take 1 tablet (80 mg total) by mouth daily at 6 PM.   buPROPion (WELLBUTRIN XL) 300 MG 24 hr tablet Take 300 mg by mouth daily.   docusate sodium (COLACE) 100 MG capsule Take 1 capsule (100 mg total) by mouth 2 (two) times daily.   escitalopram (LEXAPRO) 10 MG tablet Take 10 mg by mouth daily.   FARXIGA 5 MG TABS tablet Take 5 mg by mouth daily.    fexofenadine (ALLEGRA) 180 MG tablet Take 180 mg by mouth daily.   fluticasone (FLONASE) 50 MCG/ACT nasal spray Place 1 spray into both nostrils daily.   meclizine (ANTIVERT) 25 MG tablet Take 1 tablet (25 mg total) by mouth 3 (three) times daily as needed for dizziness.   Multiple Vitamin (MULTIVITAMIN WITH MINERALS) TABS tablet Take 1 tablet by mouth daily.   nitroGLYCERIN (NITROSTAT) 0.4 MG SL tablet Place 1 tablet (0.4 mg total) under the tongue every 5 (five) minutes x 3 doses as needed for chest pain.   spironolactone (ALDACTONE) 25 MG tablet Take 0.5 tablets (12.5 mg total) by mouth daily.   ticagrelor (BRILINTA) 90 MG TABS tablet Take 1 tablet (90 mg total) by mouth 2 (two) times daily.     Allergies:   Dilaudid [hydromorphone hcl], Erythromycin, Penicillins, Percocet [oxycodone-acetaminophen], Zithromax [azithromycin], Cefprozil, Codeine, and Oxycodone   Social History   Socioeconomic History   Marital status: Married    Spouse name: Not on file   Number of children: Not on file   Years of education: Not on file   Highest education level: Not on file  Occupational History   Not on file  Tobacco Use   Smoking status: Never   Smokeless tobacco: Never  Vaping Use   Vaping Use: Never used  Substance and Sexual Activity   Alcohol use: Yes    Comment: rare   Drug use: No   Sexual activity: Yes  Other Topics Concern   Not on file  Social History Narrative   Not on file   Social Determinants of Health   Financial Resource Strain: Not on file  Food Insecurity: Not on file  Transportation Needs: Not on file  Physical Activity: Not on file  Stress: Not on file  Social Connections: Not on file     Family History: The patient's family history includes CAD in her father.  ROS:   Please see the history of present illness.    Review of Systems  Constitutional:  Negative for chills and fever.  HENT:  Positive for congestion.   Eyes:  Negative for blurred vision.   Respiratory:  Positive for shortness of breath.   Cardiovascular:  Negative for chest pain, palpitations, orthopnea, claudication, leg swelling and PND.  Gastrointestinal:  Negative for nausea and vomiting.  Genitourinary:  Negative for hematuria.  Musculoskeletal:  Negative for falls.  Neurological:  Negative for dizziness and loss of consciousness.    EKGs/Labs/Other Studies Reviewed:    The following studies were reviewed today: Myoview 06/2020: Nuclear stress EF: 44%. The left ventricular ejection fraction is moderately decreased (30-44%). There was no ST segment deviation noted during stress. No T wave inversion was noted during stress. Defect 1: There is a large defect of severe severity present in the mid inferior, mid inferolateral, apical anterior, apical septal, apical inferior, apical lateral and apex location. Findings consistent with prior myocardial infarction with peri-infarct ischemia.  This is a high risk study.   Large apical defect with nearly zero uptake at stress and rest. There is a partially reversible portion at the apical anterior/anterolateral wall, but the vast majority of the defect is not reversible. High risk study. Findings communicated with Dr. Tobias Alexander.  Event monitor 08/27/2019 Normal sinus rhythm, sinus tachycardia; no atrial fibrillation   Cardiac catheterization 07/03/2019 LAD mid 100 RCA proximal 30 PCI: 2.5 x 20 mm Synergy DES to the mid LAD   Echocardiogram 07/03/2019 EF 50-55, mildly increased septal wall thickness, GR 1 DD, mid to apical anteroseptum/apical anterior/apical myocardium HK, trivial TR, trivial AI, trivial PI  EKG:  EKG is  ordered today.  The ekg ordered today demonstrates NSR with poor r-wave progression, q-wave in III, HR 68  Recent Labs: 11/11/2020: ALT 19 11/24/2020: BUN 18; Creatinine, Ser 1.24; Hemoglobin 11.5; Platelets 242; Potassium 3.8; Sodium 138  Recent Lipid Panel    Component Value Date/Time   CHOL 197  07/04/2019 0912   TRIG 183 (H) 07/04/2019 0912   HDL 47 07/04/2019 0912   CHOLHDL 4.2 07/04/2019 0912   VLDL 37 07/04/2019 0912   LDLCALC 113 (H) 07/04/2019 0912          Physical Exam:    VS:  BP 138/88   Pulse 68   Ht 5\' 1"  (1.549 m)   Wt 176 lb 3.2 oz (79.9 kg)   SpO2 98%   BMI 33.29 kg/m     Wt Readings from Last 3 Encounters:  03/11/21 176 lb 3.2 oz (79.9 kg)  11/23/20 187 lb (84.8 kg)  07/09/20 186 lb (84.4 kg)     GEN:  Well nourished, well developed in no acute distress HEENT: Normal NECK: No JVD; No carotid bruits CARDIAC: RRR, 1-2/6 systolic murmur. No rubs, gallops RESPIRATORY:  Clear to auscultation without rales, wheezing or rhonchi  ABDOMEN: Soft, non-tender, non-distended MUSCULOSKELETAL:  No edema; No deformity  SKIN: Warm and dry NEUROLOGIC:  Alert and oriented x 3 PSYCHIATRIC:  Normal affect   ASSESSMENT:    1. Coronary artery disease involving native coronary artery of native heart without angina pectoris   2. DOE (dyspnea on exertion)   3. Essential hypertension   4. Chronic diastolic CHF (congestive heart failure) (HCC)   5. Stage 3b chronic kidney disease (HCC)   6. Medication management   7. SOB (shortness of breath)    PLAN:    In order of problems listed above:  #SOB: Unclear etiology. Possibly related to reactive airway disease in the setting of seasonal allergies and humidity given that symptoms occur both at rest and with exertion and last only 30seconds before resolving on their own. Less likely secondary to volume overload, however, as patient likely got a lot of volume resuscitation with abdominal surgery in 12/2020.  -Check BNP  -Resume allergy medications -Start spironolactone  -Monitor symptoms and if worsen, will pursue TTE +/- cath  #Coronary Artery Disease s/p PCI to LAD: History of NSTEMI in 06/2019 s/p DES to LAD. Post-cath course complicated by dresslers syndrome. Last myoview 06/2020 with large infarct with minimal  ischemia now continued on medical management. Now anginal symptoms. -Continue brilinta 80mg  BID, ASA 81mg  daily -Continue lipitor 80mg  daily -Continue coreg 25mg  BID  #Chronic Diastolic HF: LVEF 50-55%. Euvolemic on exam. -Continue coreg 25mg  BID -Continue farxiga 5mg  daily -Start spironolactone 12.5mg  daily -BMET next week -Low Na diet  #HTN: Slightly elevated in office today. Will start spiro as above. -Continue coreg 25mg  BID -  Continue amlodipine 5mg  daily -Start spironolactone 12.5mg  daily  #HLD: -Continue lipitor 80mg  daily       Medication Adjustments/Labs and Tests Ordered: Current medicines are reviewed at length with the patient today.  Concerns regarding medicines are outlined above.  Orders Placed This Encounter  Procedures   Basic metabolic panel   Pro b natriuretic peptide   EKG 12-Lead   Meds ordered this encounter  Medications   spironolactone (ALDACTONE) 25 MG tablet    Sig: Take 0.5 tablets (12.5 mg total) by mouth daily.    Dispense:  45 tablet    Refill:  2    Patient Instructions  Medication Instructions:   START TAKING SPIRONOLACTONE 12.5 MG BY MOUTH DAILY  *If you need a refill on your cardiac medications before your next appointment, please call your pharmacy*   Lab Work:  ONE WEEK HERE IN THE OFFICE--CHECK BMET AND PRO-BNP  If you have labs (blood work) drawn today and your tests are completely normal, you will receive your results only by: MyChart Message (if you have MyChart) OR A paper copy in the mail If you have any lab test that is abnormal or we need to change your treatment, we will call you to review the results.    Follow-Up: At [facility], you and your health needs are our priority.  As part of our continuing mission to provide you with exceptional heart care, we have created designated Provider Care Teams.  These Care Teams include your primary Cardiologist (physician) and Advanced Practice Providers (APPs -   Physician Assistants and Nurse Practitioners) who all work together to provide you with the care you need, when you need it.  We recommend signing up for the patient portal called "MyChart".  Sign up information is provided on this After Visit Summary.  MyChart is used to connect with patients for Virtual Visits (Telemedicine).  Patients are able to view lab/test results, encounter notes, upcoming appointments, etc.  Non-urgent messages can be sent to your provider as well.   To learn more about what you can do with MyChart, go to [email or web address].    Your next appointment:   6 month(s)  The format for your next appointment:   In Person  Provider:   You may see DR. Quinlynn Cuthbert  or one of the following Advanced Practice Providers on your designated Care Team:   Tereso Newcomer, PA-C [place], [place]      Signed, Meriam Sprague, MD  03/11/2021 10:57 AM    [facility]

## 2021-03-11 ENCOUNTER — Ambulatory Visit: Payer: BC Managed Care – PPO | Admitting: Cardiology

## 2021-03-11 ENCOUNTER — Encounter: Payer: Self-pay | Admitting: Cardiology

## 2021-03-11 ENCOUNTER — Other Ambulatory Visit: Payer: Self-pay

## 2021-03-11 VITALS — BP 138/88 | HR 68 | Ht 61.0 in | Wt 176.2 lb

## 2021-03-11 DIAGNOSIS — I2510 Atherosclerotic heart disease of native coronary artery without angina pectoris: Secondary | ICD-10-CM

## 2021-03-11 DIAGNOSIS — E782 Mixed hyperlipidemia: Secondary | ICD-10-CM

## 2021-03-11 DIAGNOSIS — I10 Essential (primary) hypertension: Secondary | ICD-10-CM

## 2021-03-11 DIAGNOSIS — Z79899 Other long term (current) drug therapy: Secondary | ICD-10-CM

## 2021-03-11 DIAGNOSIS — N1832 Chronic kidney disease, stage 3b: Secondary | ICD-10-CM

## 2021-03-11 DIAGNOSIS — R0600 Dyspnea, unspecified: Secondary | ICD-10-CM

## 2021-03-11 DIAGNOSIS — I5032 Chronic diastolic (congestive) heart failure: Secondary | ICD-10-CM | POA: Diagnosis not present

## 2021-03-11 DIAGNOSIS — R0609 Other forms of dyspnea: Secondary | ICD-10-CM

## 2021-03-11 DIAGNOSIS — R0602 Shortness of breath: Secondary | ICD-10-CM

## 2021-03-11 MED ORDER — SPIRONOLACTONE 25 MG PO TABS: 12.5000 mg | ORAL_TABLET | Freq: Every day | ORAL | 2 refills | Status: DC

## 2021-03-11 NOTE — Patient Instructions (Signed)
Medication Instructions:   START TAKING SPIRONOLACTONE 12.5 MG BY MOUTH DAILY  *If you need a refill on your cardiac medications before your next appointment, please call your pharmacy*   Lab Work:  ONE WEEK HERE IN THE OFFICE--CHECK BMET AND PRO-BNP  If you have labs (blood work) drawn today and your tests are completely normal, you will receive your results only by: MyChart Message (if you have MyChart) OR A paper copy in the mail If you have any lab test that is abnormal or we need to change your treatment, we will call you to review the results.    Follow-Up: At [facility], you and your health needs are our priority.  As part of our continuing mission to provide you with exceptional heart care, we have created designated Provider Care Teams.  These Care Teams include your primary Cardiologist (physician) and Advanced Practice Providers (APPs -  Physician Assistants and Nurse Practitioners) who all work together to provide you with the care you need, when you need it.  We recommend signing up for the patient portal called "MyChart".  Sign up information is provided on this After Visit Summary.  MyChart is used to connect with patients for Virtual Visits (Telemedicine).  Patients are able to view lab/test results, encounter notes, upcoming appointments, etc.  Non-urgent messages can be sent to your provider as well.   To learn more about what you can do with MyChart, go to [email or web address].    Your next appointment:   6 month(s)  The format for your next appointment:   In Person  Provider:   You may see DR. PEMBERTON  or one of the following Advanced Practice Providers on your designated Care Team:   Tereso Newcomer, PA-C [place], [place]

## 2021-03-18 ENCOUNTER — Other Ambulatory Visit: Payer: BC Managed Care – PPO | Admitting: *Deleted

## 2021-03-18 ENCOUNTER — Other Ambulatory Visit: Payer: Self-pay

## 2021-03-18 DIAGNOSIS — R0602 Shortness of breath: Secondary | ICD-10-CM

## 2021-03-18 DIAGNOSIS — N1832 Chronic kidney disease, stage 3b: Secondary | ICD-10-CM

## 2021-03-18 DIAGNOSIS — I2510 Atherosclerotic heart disease of native coronary artery without angina pectoris: Secondary | ICD-10-CM

## 2021-03-18 DIAGNOSIS — R0609 Other forms of dyspnea: Secondary | ICD-10-CM

## 2021-03-18 DIAGNOSIS — Z79899 Other long term (current) drug therapy: Secondary | ICD-10-CM

## 2021-03-18 DIAGNOSIS — I5032 Chronic diastolic (congestive) heart failure: Secondary | ICD-10-CM

## 2021-03-18 DIAGNOSIS — I10 Essential (primary) hypertension: Secondary | ICD-10-CM

## 2021-03-18 DIAGNOSIS — R0600 Dyspnea, unspecified: Secondary | ICD-10-CM

## 2021-03-19 ENCOUNTER — Telehealth: Payer: Self-pay

## 2021-03-19 DIAGNOSIS — I2510 Atherosclerotic heart disease of native coronary artery without angina pectoris: Secondary | ICD-10-CM

## 2021-03-19 DIAGNOSIS — R0600 Dyspnea, unspecified: Secondary | ICD-10-CM

## 2021-03-19 DIAGNOSIS — R0609 Other forms of dyspnea: Secondary | ICD-10-CM

## 2021-03-19 DIAGNOSIS — Z79899 Other long term (current) drug therapy: Secondary | ICD-10-CM

## 2021-03-19 LAB — PRO B NATRIURETIC PEPTIDE: NT-Pro BNP: 849 pg/mL — ABNORMAL HIGH (ref 0–287)

## 2021-03-19 LAB — BASIC METABOLIC PANEL
BUN/Creatinine Ratio: 14 (ref 9–23)
BUN: 18 mg/dL (ref 6–24)
CO2: 23 mmol/L (ref 20–29)
Calcium: 9.2 mg/dL (ref 8.7–10.2)
Chloride: 104 mmol/L (ref 96–106)
Creatinine, Ser: 1.31 mg/dL — ABNORMAL HIGH (ref 0.57–1.00)
Glucose: 88 mg/dL (ref 65–99)
Potassium: 3.8 mmol/L (ref 3.5–5.2)
Sodium: 141 mmol/L (ref 134–144)
eGFR: 48 mL/min/{1.73_m2} — ABNORMAL LOW (ref >59)

## 2021-03-19 MED ORDER — FUROSEMIDE 20 MG PO TABS: 20.0000 mg | ORAL_TABLET | Freq: Every day | ORAL | 0 refills | Status: DC

## 2021-03-19 NOTE — Telephone Encounter (Signed)
Pt verbalized understanding of her lab results and will have repeat BMET after her vacation on 03/31/21. She will also let us know how she is feeling.

## 2021-03-19 NOTE — Telephone Encounter (Signed)
-----   Message from Meriam Sprague, MD sent at 03/19/2021  7:41 AM EDT ----- Her kidney function and electrolytes look stable. Her fluid levels are elevated. This is likely due to all the fluid she got during her hospitalization in [place] and may be contributing to her shortness of breath. To try to help her symptoms, we will do a short course of lasix 20mg  PO x5 days. Can we repeat a BMET in 7-10days to make sure it is stable? Please let 9-10 know how her symptoms are doing after the lasix as well.

## 2021-03-31 ENCOUNTER — Other Ambulatory Visit: Payer: BC Managed Care – PPO

## 2021-04-08 ENCOUNTER — Other Ambulatory Visit: Payer: BC Managed Care – PPO

## 2021-04-09 ENCOUNTER — Other Ambulatory Visit: Payer: Self-pay

## 2021-04-09 ENCOUNTER — Other Ambulatory Visit: Payer: BC Managed Care – PPO | Admitting: *Deleted

## 2021-04-09 DIAGNOSIS — Z79899 Other long term (current) drug therapy: Secondary | ICD-10-CM

## 2021-04-09 DIAGNOSIS — R0600 Dyspnea, unspecified: Secondary | ICD-10-CM

## 2021-04-09 DIAGNOSIS — I2510 Atherosclerotic heart disease of native coronary artery without angina pectoris: Secondary | ICD-10-CM

## 2021-04-09 DIAGNOSIS — R0609 Other forms of dyspnea: Secondary | ICD-10-CM

## 2021-04-10 LAB — BASIC METABOLIC PANEL
BUN/Creatinine Ratio: 17 (ref 9–23)
BUN: 30 mg/dL — ABNORMAL HIGH (ref 6–24)
CO2: 18 mmol/L — ABNORMAL LOW (ref 20–29)
Calcium: 9.8 mg/dL (ref 8.7–10.2)
Chloride: 104 mmol/L (ref 96–106)
Creatinine, Ser: 1.76 mg/dL — ABNORMAL HIGH (ref 0.57–1.00)
Glucose: 116 mg/dL — ABNORMAL HIGH (ref 65–99)
Potassium: 4.4 mmol/L (ref 3.5–5.2)
Sodium: 138 mmol/L (ref 134–144)
eGFR: 33 mL/min/{1.73_m2} — ABNORMAL LOW (ref >59)

## 2021-04-12 ENCOUNTER — Other Ambulatory Visit: Payer: Self-pay

## 2021-04-12 DIAGNOSIS — I10 Essential (primary) hypertension: Secondary | ICD-10-CM

## 2021-04-19 ENCOUNTER — Other Ambulatory Visit: Payer: BC Managed Care – PPO | Admitting: *Deleted

## 2021-04-19 ENCOUNTER — Other Ambulatory Visit: Payer: Self-pay

## 2021-04-19 DIAGNOSIS — I10 Essential (primary) hypertension: Secondary | ICD-10-CM

## 2021-04-19 LAB — BASIC METABOLIC PANEL
BUN/Creatinine Ratio: 10 (ref 9–23)
BUN: 14 mg/dL (ref 6–24)
CO2: 25 mmol/L (ref 20–29)
Calcium: 9.5 mg/dL (ref 8.7–10.2)
Chloride: 102 mmol/L (ref 96–106)
Creatinine, Ser: 1.38 mg/dL — ABNORMAL HIGH (ref 0.57–1.00)
Glucose: 131 mg/dL — ABNORMAL HIGH (ref 65–99)
Potassium: 3.9 mmol/L (ref 3.5–5.2)
Sodium: 141 mmol/L (ref 134–144)
eGFR: 45 mL/min/{1.73_m2} — ABNORMAL LOW (ref >59)

## 2021-05-04 ENCOUNTER — Telehealth: Payer: Self-pay | Admitting: *Deleted

## 2021-05-04 NOTE — Telephone Encounter (Signed)
Called to schedule 24 hr ambulatory blood pressure monitor.  Patient is switching practices and wants to wait to see what Dr. Jacinto Halim recommends.

## 2021-05-14 ENCOUNTER — Ambulatory Visit: Payer: BC Managed Care – PPO | Admitting: Cardiology

## 2021-05-19 ENCOUNTER — Encounter: Payer: Self-pay | Admitting: Cardiology

## 2021-05-19 ENCOUNTER — Other Ambulatory Visit: Payer: Self-pay

## 2021-05-19 ENCOUNTER — Ambulatory Visit: Payer: BC Managed Care – PPO | Admitting: Cardiology

## 2021-05-19 VITALS — BP 142/87 | HR 81 | Temp 97.7°F | Resp 16 | Ht 61.0 in | Wt 180.0 lb

## 2021-05-19 DIAGNOSIS — E1122 Type 2 diabetes mellitus with diabetic chronic kidney disease: Secondary | ICD-10-CM

## 2021-05-19 DIAGNOSIS — N1831 Chronic kidney disease, stage 3a: Secondary | ICD-10-CM

## 2021-05-19 DIAGNOSIS — E7800 Pure hypercholesterolemia, unspecified: Secondary | ICD-10-CM

## 2021-05-19 DIAGNOSIS — I2510 Atherosclerotic heart disease of native coronary artery without angina pectoris: Secondary | ICD-10-CM

## 2021-05-19 DIAGNOSIS — I10 Essential (primary) hypertension: Secondary | ICD-10-CM

## 2021-05-19 MED ORDER — LOSARTAN POTASSIUM 25 MG PO TABS: 25.0000 mg | ORAL_TABLET | Freq: Every evening | ORAL | 2 refills | Status: DC

## 2021-05-19 MED ORDER — DAPAGLIFLOZIN PROPANEDIOL 10 MG PO TABS: 10.0000 mg | ORAL_TABLET | Freq: Every day | ORAL | 3 refills | Status: DC

## 2021-05-19 NOTE — Progress Notes (Signed)
Primary Physician/Referring:  Leeroy Cha, MD  Patient ID: Andrea Robertson, female    DOB: 1961/06/22, 60 y.o.   MRN: [id number]  Chief Complaint  Patient presents with   Coronary Artery Disease   Congestive Heart Failure   New Patient (Initial Visit)    Self Referral   HPI:    Andrea Robertson  is a 60 y.o. African-American female patient with history of NSTEMI and CAD, LAD stenting in 2020, diet-controlled diabetes mellitus, hypertension, hyperlipidemia, chronic stage III kidney disease probably due to hypertension and diabetes mellitus presents to establish care.  Except for chronic dyspnea on exertion, she has no specific complaints.    Past Medical History:  Diagnosis Date   AKI (acute kidney injury) ([place])    stage? 3 not followed by a kidney MD    Anxiety    Arthritis    Coronary artery disease involving native coronary artery of native heart without angina pectoris 07/04/2019   Depression    Diabetes mellitus without complication ([place])    type 2    Essential hypertension 10/30/2018   GERD (gastroesophageal reflux disease)    Hyperlipidemia 07/04/2019   Myocardial infarction [facility])    Primary localized osteoarthritis of left knee    PVC (premature ventricular contraction) 10/30/2018   S/P total knee arthroplasty, right    Shortness of breath    pt denies    Vertigo 10/30/2018   Past Surgical History:  Procedure Laterality Date   ABDOMINAL HYSTERECTOMY     CHOLECYSTECTOMY     CORONARY STENT INTERVENTION N/A 07/03/2019   Procedure: CORONARY STENT INTERVENTION;  Surgeon: Sherren Mocha, MD;  Location: [facility];  Service: Cardiovascular;  Laterality: N/A;   LEFT HEART CATH AND CORONARY ANGIOGRAPHY N/A 07/03/2019   Procedure: LEFT HEART CATH AND CORONARY ANGIOGRAPHY;  Surgeon: Sherren Mocha, MD;  Location: [facility];  Service: Cardiovascular;  Laterality: N/A;   TOTAL KNEE ARTHROPLASTY Right 10/13/2015   Procedure: RIGHT TOTAL KNEE  ARTHROPLASTY;  Surgeon: Latanya Maudlin, MD;  Location: [facility];  Service: Orthopedics;  Laterality: Right;   TOTAL KNEE ARTHROPLASTY Left 11/23/2020   Procedure: TOTAL KNEE ARTHROPLASTY;  Surgeon: Elsie Saas, MD;  Location: [facility];  Service: Orthopedics;  Laterality: Left;   Family History  Problem Relation Age of Onset   Heart disease Mother    Multiple myeloma Mother    Heart attack Father 82   CAD Father    Hypertension Sister    Hypertension Sister    Hypertension Sister    Heart failure Brother     Social History   Tobacco Use   Smoking status: Never   Smokeless tobacco: Never  Substance Use Topics   Alcohol use: Yes    Comment: rare   Marital Status: Married  ROS  Review of Systems  Cardiovascular:  Positive for dyspnea on exertion. Negative for chest pain and leg swelling.  Gastrointestinal:  Negative for melena.  Objective  Blood pressure (!) 142/87, pulse 81, temperature 97.7 F (36.5 C), temperature source Temporal, resp. rate 16, height '5\' 1"'  (1.549 m), weight 180 lb (81.6 kg), SpO2 97 %. Body mass index is 34.01 kg/m.  Vitals with BMI 05/19/2021 05/19/2021 03/11/2021  Height - '5\' 1"'  '5\' 1"'   Weight - 180 lbs 176 lbs 3 oz  BMI - 01.56 15.37  Systolic 943 276 147  Diastolic 87 90 88  Pulse 81 83 68     Physical Exam Constitutional:  Appearance: She is obese.  Neck:     Vascular: No carotid bruit or JVD.  Cardiovascular:     Rate and Rhythm: Normal rate and regular rhythm.     Pulses: Intact distal pulses.     Heart sounds: Normal heart sounds. No murmur heard.   No gallop.  Pulmonary:     Effort: Pulmonary effort is normal.     Breath sounds: Normal breath sounds.  Abdominal:     General: Bowel sounds are normal.     Palpations: Abdomen is soft.  Musculoskeletal:        General: No swelling.     Laboratory examination:   Recent Labs    11/11/20 1147 11/24/20 0311 03/18/21 1520 04/09/21 1619 04/19/21 0950  NA 140 138 141 138 141  K 3.8  3.8 3.8 4.4 3.9  CL 103 105 104 104 102  CO2 '29 24 23 ' 18* 25  GLUCOSE 109* 211* 88 116* 131*  BUN '19 18 18 ' 30* 14  CREATININE 1.28* 1.24* 1.31* 1.76* 1.38*  CALCIUM 9.1 8.5* 9.2 9.8 9.5  GFRNONAA 49* 51*  --   --   --    CrCl cannot be calculated (Patient's most recent lab result is older than the maximum 21 days allowed.).  CMP Latest Ref Rng & Units 04/19/2021 04/09/2021 03/18/2021  Glucose 65 - 99 mg/dL 131(H) 116(H) 88  BUN 6 - 24 mg/dL 14 30(H) 18  Creatinine 0.57 - 1.00 mg/dL 1.38(H) 1.76(H) 1.31(H)  Sodium 134 - 144 mmol/L 141 138 141  Potassium 3.5 - 5.2 mmol/L 3.9 4.4 3.8  Chloride 96 - 106 mmol/L 102 104 104  CO2 20 - 29 mmol/L 25 18(L) 23  Calcium 8.7 - 10.2 mg/dL 9.5 9.8 9.2  Total Protein 6.5 - 8.1 g/dL - - -  Total Bilirubin 0.3 - 1.2 mg/dL - - -  Alkaline Phos 38 - 126 U/L - - -  AST 15 - 41 U/L - - -  ALT 0 - 44 U/L - - -   CBC Latest Ref Rng & Units 11/24/2020 11/11/2020 08/08/2019  WBC 4.0 - 10.5 K/uL 19.0(H) 9.7 11.4(H)  Hemoglobin 12.0 - 15.0 g/dL 11.5(L) 13.8 11.9(L)  Hematocrit 36.0 - 46.0 % 34.8(L) 41.9 36.9  Platelets 150 - 400 K/uL 242 310 358    Lipid Panel No results for input(s): CHOL, TRIG, LDLCALC, VLDL, HDL, CHOLHDL, LDLDIRECT in the last 8760 hours. Lipid Panel     Component Value Date/Time   CHOL 197 07/04/2019 0912   TRIG 183 (H) 07/04/2019 0912   HDL 47 07/04/2019 0912   CHOLHDL 4.2 07/04/2019 0912   VLDL 37 07/04/2019 0912   LDLCALC 113 (H) 07/04/2019 0912     HEMOGLOBIN A1C Lab Results  Component Value Date   HGBA1C 6.8 (H) 11/11/2020   MPG 148.46 11/11/2020   TSH No results for input(s): TSH in the last 8760 hours.  External labs:   Cholesterol, total 155.000 m 12/15/2020 HDL 57.000 mg 12/15/2020 LDL 82.000 mg 12/15/2020 Triglycerides 88.000 mg 12/15/2020  A1C 6.600 mg/ 12/15/2020  Creatinine, Serum 1.380 mg/ 04/19/2021 Potassium 3.900 mm 04/19/2021  Medications and allergies   Allergies  Allergen Reactions   Dilaudid  [Hydromorphone Hcl] Hives    Can tolerate if premedicated with benadryl   Erythromycin Nausea And Vomiting   Penicillins Hives and Nausea And Vomiting    Has patient had a PCN reaction causing immediate rash, facial/tongue/throat swelling, SOB or lightheadedness with hypotension: No Has patient had a PCN  reaction causing severe rash involving mucus membranes or skin necrosis: No Has patient had a PCN reaction that required hospitalization No Has patient had a PCN reaction occurring within the last 10 years: Yes If all of the above answers are "NO", then may proceed with Cephalosporin use. Other reaction(s): hives/vomiting   Percocet [Oxycodone-Acetaminophen] Itching and Nausea And Vomiting    Can tolerate with premedication of diphenhydramine   Zithromax [Azithromycin] Nausea And Vomiting   Cefprozil     Other reaction(s): rash but takes Ceftin   Codeine     Other reaction(s): nausea   Oxycodone     Other reaction(s): nausea, epigastric pain     Medication prior to this encounter:   Outpatient Medications Prior to Visit  Medication Sig Dispense Refill   Accu-Chek FastClix Lancets MISC USE TO TEST YOUR BLOOD SUGAR TWICE A DAY DX E11.21     acetaminophen (TYLENOL) 500 MG tablet Take 2 tablets (1,000 mg total) by mouth every 8 (eight) hours as needed. 30 tablet 0   amLODipine (NORVASC) 5 MG tablet Take 1 tablet (5 mg total) by mouth daily. 90 tablet 3   aspirin EC 81 MG EC tablet Take 1 tablet (81 mg total) by mouth daily. 90 tablet 3   atorvastatin (LIPITOR) 80 MG tablet Take 1 tablet (80 mg total) by mouth daily at 6 PM. 90 tablet 3   buPROPion (WELLBUTRIN XL) 300 MG 24 hr tablet Take 300 mg by mouth daily.     carvedilol (COREG) 25 MG tablet Take 1 tablet (25 mg total) by mouth 2 (two) times daily. 180 tablet 3   escitalopram (LEXAPRO) 10 MG tablet Take 10 mg by mouth daily.     fexofenadine (ALLEGRA) 180 MG tablet Take 180 mg by mouth daily.     fluticasone (FLONASE) 50 MCG/ACT  nasal spray Place 1 spray into both nostrils daily.     meclizine (ANTIVERT) 25 MG tablet Take 1 tablet (25 mg total) by mouth 3 (three) times daily as needed for dizziness. 30 tablet 0   Multiple Vitamin (MULTIVITAMIN WITH MINERALS) TABS tablet Take 1 tablet by mouth daily.     nitroGLYCERIN (NITROSTAT) 0.4 MG SL tablet Place 1 tablet (0.4 mg total) under the tongue every 5 (five) minutes x 3 doses as needed for chest pain. 25 tablet 1   FARXIGA 5 MG TABS tablet Take 5 mg by mouth daily.     furosemide (LASIX) 20 MG tablet Take 1 tablet (20 mg total) by mouth daily. For five days only 5 tablet 0   ticagrelor (BRILINTA) 90 MG TABS tablet Take 1 tablet (90 mg total) by mouth 2 (two) times daily. 180 tablet 3   docusate sodium (COLACE) 100 MG capsule Take 1 capsule (100 mg total) by mouth 2 (two) times daily. 10 capsule 0   gabapentin (NEURONTIN) 300 MG capsule Take 1 capsule (300 mg total) by mouth at bedtime. FOR NERVE PAIN (Patient not taking: Reported on 03/11/2021) 30 capsule 0   HYDROmorphone (DILAUDID) 2 MG tablet Take 1 tablet (2 mg total) by mouth every 4 (four) hours as needed for severe pain (PATIENT HAD A TOTAL KNEE REPLACMENT ON 11/23/2020). (Patient not taking: Reported on 03/11/2021) 42 tablet 0   polyethylene glycol (MIRALAX / GLYCOLAX) 17 g packet Take 17 g by mouth 2 (two) times daily. 14 each 0   No facility-administered medications prior to visit.     Medication list after today's encounter   Current Outpatient Medications  Medication Instructions  Accu-Chek FastClix Lancets MISC USE TO TEST YOUR BLOOD SUGAR TWICE A DAY DX E11.21   acetaminophen (TYLENOL) 1,000 mg, Oral, Every 8 hours PRN   amLODipine (NORVASC) 5 mg, Oral, Daily   aspirin 81 mg, Oral, Daily   atorvastatin (LIPITOR) 80 mg, Oral, Daily-1800   buPROPion (WELLBUTRIN XL) 300 mg, Oral, Daily   carvedilol (COREG) 25 mg, Oral, 2 times daily   dapagliflozin propanediol (FARXIGA) 10 mg, Oral, Daily before breakfast    escitalopram (LEXAPRO) 10 mg, Oral, Daily   fexofenadine (ALLEGRA) 180 mg, Oral, Daily   fluticasone (FLONASE) 50 MCG/ACT nasal spray 1 spray, Each Nare, Daily   losartan (COZAAR) 25 mg, Oral, Every evening   meclizine (ANTIVERT) 25 mg, Oral, 3 times daily PRN   Multiple Vitamin (MULTIVITAMIN WITH MINERALS) TABS tablet 1 tablet, Oral, Daily   nitroGLYCERIN (NITROSTAT) 0.4 mg, Sublingual, Every 5 min x3 PRN   Radiology:   No results found.  Cardiac Studies:   Myoview 06/2020: Nuclear stress EF: 44%. The left ventricular ejection fraction is moderately decreased (30-44%). There was no ST segment deviation noted during stress. No T wave inversion was noted during stress. Defect 1: There is a large defect of severe severity present in the mid inferior, mid inferolateral, apical anterior, apical septal, apical inferior, apical lateral and apex location. Findings consistent with prior myocardial infarction with peri-infarct ischemia. This is a high risk study.   Large apical defect with nearly zero uptake at stress and rest. There is a partially reversible portion at the apical anterior/anterolateral wall, but the vast majority of the defect is not reversible. High risk study. Findings communicated with Dr. Ena Dawley.   Event monitor 08/27/2019 14 days Normal sinus rhythm, sinus tachycardia; no atrial fibrillation   Cardiac catheterization 07/03/2019 1. Total occlusion of the mid-LAD, treated successfully with PCI using a 2.5x20 mm Synergy DES 2. Mild nonobstructive stenosis of the LCx and RCA 3. Mildly elevated LVEDP   Echocardiogram 07/03/2019 EF 50-55, mildly increased septal wall thickness, GR 1 DD, mid to apical anteroseptum/apical anterior/apical myocardium HK, trivial TR, trivial AI, trivial PI  EKG:   EKG 05/19/2021: Normal sinus rhythm at rate of 76 bpm, inferior infarct old.  Anterolateral infarct old.  LVH.  Nonspecific T abnormality diffuse.    Assessment      ICD-10-CM   1. Coronary artery disease involving native coronary artery of native heart without angina pectoris  I25.10 EKG 12-Lead    dapagliflozin propanediol (FARXIGA) 10 MG TABS tablet    High sensitivity CRP    PCV ECHOCARDIOGRAM COMPLETE    2. Essential hypertension  U04 Basic metabolic panel    3. Stage 3a chronic kidney disease (HCC)  N18.31     4. Pure hypercholesterolemia  E78.00 Lipid Panel With LDL/HDL Ratio    5. Type 2 diabetes mellitus with stage 3a chronic kidney disease, without long-term current use of insulin (HCC)  E11.22 losartan (COZAAR) 25 MG tablet   N18.31 dapagliflozin propanediol (FARXIGA) 10 MG TABS tablet       Medications Discontinued During This Encounter  Medication Reason   docusate sodium (COLACE) 100 MG capsule Error   polyethylene glycol (MIRALAX / GLYCOLAX) 17 g packet Error   HYDROmorphone (DILAUDID) 2 MG tablet Error   gabapentin (NEURONTIN) 300 MG capsule Error   FARXIGA 5 MG TABS tablet Dose change   furosemide (LASIX) 20 MG tablet No longer needed (for PRN medications)   ticagrelor (BRILINTA) 90 MG TABS tablet Completed Course  Meds ordered this encounter  Medications   losartan (COZAAR) 25 MG tablet    Sig: Take 1 tablet (25 mg total) by mouth every evening.    Dispense:  30 tablet    Refill:  2   dapagliflozin propanediol (FARXIGA) 10 MG TABS tablet    Sig: Take 1 tablet (10 mg total) by mouth daily before breakfast.    Dispense:  90 tablet    Refill:  3    Orders Placed This Encounter  Procedures   Lipid Panel With LDL/HDL Ratio   High sensitivity CRP   Basic metabolic panel   EKG [id number]   PCV ECHOCARDIOGRAM COMPLETE    Standing Status:   Future    Standing Expiration Date:   05/19/2022   Recommendations:   Eulalie Speights is a 60 y.o. African-American female patient with history of NSTEMI and CAD, LAD stenting in 2020, diet-controlled diabetes mellitus, hypertension, hyperlipidemia, chronic stage III kidney disease  probably due to hypertension and diabetes mellitus presents to establish care.  Except for chronic dyspnea on exertion, she has no specific complaints.  States that she has been doing well.  Admits to having gained weight.  Physical examination is essentially unremarkable except for moderate obesity.  I reviewed her labs, reviewed her previously performed echocardiogram and also nuclear stress test and cardiac catheterization reports.  From cardiac standpoint she has not had any recurrence of angina pectoris.  I will discontinue Plavix and continue aspirin indefinitely as it has been >1-year since angioplasty.  Dyspnea on exertion probably related to hypertension, obesity and deconditioning.  No clinical evidence of heart failure.  We will increase her Farxiga from 5 mg to 10 mg and hopefully will help with weight loss as well and for also cardiovascular protection.  I would like to obtain a BMP, hs-CRP and lipid profile in 2 to 3 weeks.  I also started on losartan 25 mg in the evening in view of diabetic state and hypertension.  She does have stage III chronic kidney disease and will monitor this closely.  Weight loss was discussed in detail.  I will see her back in 8 weeks for follow-up.    Adrian Prows, MD, [facility] 05/19/2021, 10:17 AM Office: [phone]

## 2021-05-19 NOTE — Patient Instructions (Signed)

## 2021-05-28 ENCOUNTER — Encounter: Payer: Self-pay | Admitting: Cardiology

## 2021-06-01 ENCOUNTER — Ambulatory Visit: Payer: BC Managed Care – PPO

## 2021-06-01 ENCOUNTER — Other Ambulatory Visit: Payer: Self-pay

## 2021-06-01 DIAGNOSIS — I2510 Atherosclerotic heart disease of native coronary artery without angina pectoris: Secondary | ICD-10-CM

## 2021-06-02 ENCOUNTER — Emergency Department (HOSPITAL_COMMUNITY)
Admission: EM | Admit: 2021-06-02 | Discharge: 2021-06-02 | Discharge status: Against Medical Advice | Payer: BC Managed Care – PPO | Attending: Student | Admitting: Student

## 2021-06-03 ENCOUNTER — Encounter: Payer: Self-pay | Admitting: Student

## 2021-06-03 ENCOUNTER — Other Ambulatory Visit: Payer: Self-pay

## 2021-06-03 ENCOUNTER — Ambulatory Visit: Payer: BC Managed Care – PPO | Admitting: Student

## 2021-06-03 VITALS — BP 127/76 | HR 76 | Temp 98.0°F | Ht 61.0 in | Wt 184.4 lb

## 2021-06-03 DIAGNOSIS — I2510 Atherosclerotic heart disease of native coronary artery without angina pectoris: Secondary | ICD-10-CM

## 2021-06-03 DIAGNOSIS — R072 Precordial pain: Secondary | ICD-10-CM

## 2021-06-03 NOTE — Progress Notes (Signed)
She will need nuclear stress and see me later or can see you. Let her know

## 2021-06-03 NOTE — Progress Notes (Signed)
Primary Physician/Referring:  Leeroy Cha, MD  Patient ID: Andrea Robertson, female    DOB: 1961/04/25, 60 y.o.   MRN: [id number]  Chief Complaint  Patient presents with   Chest Pain   Shoulder discomfort   HPI:    Floyce Bujak  is a 60 y.o. African-American female patient with history of NSTEMI and CAD, LAD stenting in 2020, diet-controlled diabetes mellitus, hypertension, hyperlipidemia, chronic stage III kidney disease probably due to hypertension and diabetes mellitus.   Patient presents for urgent visit at her request with concerns of chest pain.  She went to the ED last night for evaluation, however she did not stay for work-up.  Notably last visit increase Farxiga from 5 mg to 10 mg daily, added losartan 25 mg daily.  Patient states yesterday she developed left arm pain and numbness which lasted for approximately 1 hour denies chest pain, shortness of breath, syncope, near syncope, palpitations.  Pain has since resolved.  Notably she is also tolerating losartan and increased dose of Farxiga without issue.  Blood pressure is now well controlled.  Past Medical History:  Diagnosis Date   AKI (acute kidney injury) ([place])    stage? 3 not followed by a kidney MD    Anxiety    Arthritis    Coronary artery disease involving native coronary artery of native heart without angina pectoris 07/04/2019   Depression    Diabetes mellitus without complication ([place])    type 2    Essential hypertension 10/30/2018   GERD (gastroesophageal reflux disease)    Hyperlipidemia 07/04/2019   Myocardial infarction [facility])    Primary localized osteoarthritis of left knee    PVC (premature ventricular contraction) 10/30/2018   S/P total knee arthroplasty, right    Shortness of breath    pt denies    Vertigo 10/30/2018   Past Surgical History:  Procedure Laterality Date   ABDOMINAL HYSTERECTOMY     CHOLECYSTECTOMY     CORONARY STENT INTERVENTION N/A 07/03/2019   Procedure: CORONARY  STENT INTERVENTION;  Surgeon: Sherren Mocha, MD;  Location: [facility];  Service: Cardiovascular;  Laterality: N/A;   LEFT HEART CATH AND CORONARY ANGIOGRAPHY N/A 07/03/2019   Procedure: LEFT HEART CATH AND CORONARY ANGIOGRAPHY;  Surgeon: Sherren Mocha, MD;  Location: [facility];  Service: Cardiovascular;  Laterality: N/A;   TOTAL KNEE ARTHROPLASTY Right 10/13/2015   Procedure: RIGHT TOTAL KNEE ARTHROPLASTY;  Surgeon: Latanya Maudlin, MD;  Location: [facility];  Service: Orthopedics;  Laterality: Right;   TOTAL KNEE ARTHROPLASTY Left 11/23/2020   Procedure: TOTAL KNEE ARTHROPLASTY;  Surgeon: Elsie Saas, MD;  Location: [facility];  Service: Orthopedics;  Laterality: Left;   Family History  Problem Relation Age of Onset   Heart disease Mother    Multiple myeloma Mother    Heart attack Father 63   CAD Father    Hypertension Sister    Hypertension Sister    Hypertension Sister    Heart failure Brother     Social History   Tobacco Use   Smoking status: Never   Smokeless tobacco: Never  Substance Use Topics   Alcohol use: Yes    Comment: rare   Marital Status: Married  ROS  Review of Systems  Constitutional: Negative for malaise/fatigue and weight gain.  Cardiovascular:  Positive for dyspnea on exertion (chronic, stable). Negative for chest pain, claudication, leg swelling, near-syncope, orthopnea, palpitations, paroxysmal nocturnal dyspnea and syncope.  Respiratory:  Negative for shortness of breath.  Musculoskeletal:  Positive for arthritis and back pain.       Left arm pain and numbness  Gastrointestinal:  Negative for melena.  Neurological:  Negative for dizziness.  Objective  Blood pressure 127/76, pulse 76, temperature 98 F (36.7 C), temperature source Temporal, height '5\' 1"'  (1.549 m), weight 184 lb 6.4 oz (83.6 kg), SpO2 97 %. Body mass index is 34.84 kg/m.  Vitals with BMI 06/03/2021 05/19/2021 05/19/2021  Height '5\' 1"'  - '5\' 1"'   Weight 184 lbs 6 oz - 180 lbs   BMI 66.44 - 03.47  Systolic 425 956 387  Diastolic 76 87 90  Pulse 76 81 83     Physical Exam Vitals reviewed.  Constitutional:      Appearance: She is obese.  Neck:     Vascular: No carotid bruit or JVD.  Cardiovascular:     Rate and Rhythm: Normal rate and regular rhythm.     Pulses: Intact distal pulses.     Heart sounds: Normal heart sounds. No murmur heard.   No gallop.  Pulmonary:     Effort: Pulmonary effort is normal.     Breath sounds: Normal breath sounds.  Abdominal:     General: Bowel sounds are normal.     Palpations: Abdomen is soft.  Musculoskeletal:     Right lower leg: No edema.     Left lower leg: No edema.     Laboratory examination:   Recent Labs    11/11/20 1147 11/24/20 0311 03/18/21 1520 04/09/21 1619 04/19/21 0950  NA 140 138 141 138 141  K 3.8 3.8 3.8 4.4 3.9  CL 103 105 104 104 102  CO2 '29 24 23 ' 18* 25  GLUCOSE 109* 211* 88 116* 131*  BUN '19 18 18 ' 30* 14  CREATININE 1.28* 1.24* 1.31* 1.76* 1.38*  CALCIUM 9.1 8.5* 9.2 9.8 9.5  GFRNONAA 49* 51*  --   --   --    CrCl cannot be calculated (Patient's most recent lab result is older than the maximum 21 days allowed.).  CMP Latest Ref Rng & Units 04/19/2021 04/09/2021 03/18/2021  Glucose 65 - 99 mg/dL 131(H) 116(H) 88  BUN 6 - 24 mg/dL 14 30(H) 18  Creatinine 0.57 - 1.00 mg/dL 1.38(H) 1.76(H) 1.31(H)  Sodium 134 - 144 mmol/L 141 138 141  Potassium 3.5 - 5.2 mmol/L 3.9 4.4 3.8  Chloride 96 - 106 mmol/L 102 104 104  CO2 20 - 29 mmol/L 25 18(L) 23  Calcium 8.7 - 10.2 mg/dL 9.5 9.8 9.2  Total Protein 6.5 - 8.1 g/dL - - -  Total Bilirubin 0.3 - 1.2 mg/dL - - -  Alkaline Phos 38 - 126 U/L - - -  AST 15 - 41 U/L - - -  ALT 0 - 44 U/L - - -   CBC Latest Ref Rng & Units 11/24/2020 11/11/2020 08/08/2019  WBC 4.0 - 10.5 K/uL 19.0(H) 9.7 11.4(H)  Hemoglobin 12.0 - 15.0 g/dL 11.5(L) 13.8 11.9(L)  Hematocrit 36.0 - 46.0 % 34.8(L) 41.9 36.9  Platelets 150 - 400 K/uL 242 310 358    Lipid Panel No  results for input(s): CHOL, TRIG, LDLCALC, VLDL, HDL, CHOLHDL, LDLDIRECT in the last 8760 hours. Lipid Panel     Component Value Date/Time   CHOL 197 07/04/2019 0912   TRIG 183 (H) 07/04/2019 0912   HDL 47 07/04/2019 0912   CHOLHDL 4.2 07/04/2019 0912   VLDL 37 07/04/2019 0912   LDLCALC 113 (H) 07/04/2019 0912  HEMOGLOBIN A1C Lab Results  Component Value Date   HGBA1C 6.8 (H) 11/11/2020   MPG 148.46 11/11/2020   TSH No results for input(s): TSH in the last 8760 hours.  External labs:   Cholesterol, total 155.000 m 12/15/2020 HDL 57.000 mg 12/15/2020 LDL 82.000 mg 12/15/2020 Triglycerides 88.000 mg 12/15/2020  A1C 6.600 mg/ 12/15/2020  Creatinine, Serum 1.380 mg/ 04/19/2021 Potassium 3.900 mm 04/19/2021  Allergies   Allergies  Allergen Reactions   Dilaudid [Hydromorphone Hcl] Hives    Can tolerate if premedicated with benadryl   Erythromycin Nausea And Vomiting   Penicillins Hives and Nausea And Vomiting    Has patient had a PCN reaction causing immediate rash, facial/tongue/throat swelling, SOB or lightheadedness with hypotension: No Has patient had a PCN reaction causing severe rash involving mucus membranes or skin necrosis: No Has patient had a PCN reaction that required hospitalization No Has patient had a PCN reaction occurring within the last 10 years: Yes If all of the above answers are "NO", then may proceed with Cephalosporin use. Other reaction(s): hives/vomiting   Percocet [Oxycodone-Acetaminophen] Itching and Nausea And Vomiting    Can tolerate with premedication of diphenhydramine   Zithromax [Azithromycin] Nausea And Vomiting   Cefprozil     Other reaction(s): rash but takes Ceftin   Codeine     Other reaction(s): nausea   Oxycodone     Other reaction(s): nausea, epigastric pain     Medication prior to this encounter:   Outpatient Medications Prior to Visit  Medication Sig Dispense Refill   Accu-Chek FastClix Lancets MISC USE TO TEST YOUR BLOOD  SUGAR TWICE A DAY DX E11.21     acetaminophen (TYLENOL) 500 MG tablet Take 2 tablets (1,000 mg total) by mouth every 8 (eight) hours as needed. 30 tablet 0   amLODipine (NORVASC) 5 MG tablet Take 1 tablet (5 mg total) by mouth daily. 90 tablet 3   aspirin EC 81 MG EC tablet Take 1 tablet (81 mg total) by mouth daily. 90 tablet 3   atorvastatin (LIPITOR) 80 MG tablet Take 1 tablet (80 mg total) by mouth daily at 6 PM. 90 tablet 3   buPROPion (WELLBUTRIN XL) 300 MG 24 hr tablet Take 300 mg by mouth daily.     carvedilol (COREG) 25 MG tablet Take 1 tablet (25 mg total) by mouth 2 (two) times daily. 180 tablet 3   dapagliflozin propanediol (FARXIGA) 10 MG TABS tablet Take 1 tablet (10 mg total) by mouth daily before breakfast. 90 tablet 3   escitalopram (LEXAPRO) 10 MG tablet Take 10 mg by mouth daily.     fexofenadine (ALLEGRA) 180 MG tablet Take 180 mg by mouth daily.     fluticasone (FLONASE) 50 MCG/ACT nasal spray Place 1 spray into both nostrils daily.     losartan (COZAAR) 25 MG tablet Take 1 tablet (25 mg total) by mouth every evening. 30 tablet 2   meclizine (ANTIVERT) 25 MG tablet Take 1 tablet (25 mg total) by mouth 3 (three) times daily as needed for dizziness. 30 tablet 0   Multiple Vitamin (MULTIVITAMIN WITH MINERALS) TABS tablet Take 1 tablet by mouth daily.     nitroGLYCERIN (NITROSTAT) 0.4 MG SL tablet Place 1 tablet (0.4 mg total) under the tongue every 5 (five) minutes x 3 doses as needed for chest pain. 25 tablet 1   No facility-administered medications prior to visit.     Medication list after today's encounter   Current Outpatient Medications  Medication Instructions  Accu-Chek FastClix Lancets MISC USE TO TEST YOUR BLOOD SUGAR TWICE A DAY DX E11.21   acetaminophen (TYLENOL) 1,000 mg, Oral, Every 8 hours PRN   amLODipine (NORVASC) 5 mg, Oral, Daily   aspirin 81 mg, Oral, Daily   atorvastatin (LIPITOR) 80 mg, Oral, Daily-1800   buPROPion (WELLBUTRIN XL) 300 mg, Oral,  Daily   carvedilol (COREG) 25 mg, Oral, 2 times daily   dapagliflozin propanediol (FARXIGA) 10 mg, Oral, Daily before breakfast   escitalopram (LEXAPRO) 10 mg, Oral, Daily   fexofenadine (ALLEGRA) 180 mg, Oral, Daily   fluticasone (FLONASE) 50 MCG/ACT nasal spray 1 spray, Each Nare, Daily   losartan (COZAAR) 25 mg, Oral, Every evening   meclizine (ANTIVERT) 25 mg, Oral, 3 times daily PRN   Multiple Vitamin (MULTIVITAMIN WITH MINERALS) TABS tablet 1 tablet, Oral, Daily   nitroGLYCERIN (NITROSTAT) 0.4 mg, Sublingual, Every 5 min x3 PRN   Radiology:   No results found.  Cardiac Studies:   Myoview 06/2020: Nuclear stress EF: 44%. The left ventricular ejection fraction is moderately decreased (30-44%). There was no ST segment deviation noted during stress. No T wave inversion was noted during stress. Defect 1: There is a large defect of severe severity present in the mid inferior, mid inferolateral, apical anterior, apical septal, apical inferior, apical lateral and apex location. Findings consistent with prior myocardial infarction with peri-infarct ischemia. This is a high risk study.   Large apical defect with nearly zero uptake at stress and rest. There is a partially reversible portion at the apical anterior/anterolateral wall, but the vast majority of the defect is not reversible. High risk study. Findings communicated with Dr. Ena Dawley.   Event monitor 08/27/2019 14 days Normal sinus rhythm, sinus tachycardia; no atrial fibrillation   Cardiac catheterization 07/03/2019 1. Total occlusion of the mid-LAD, treated successfully with PCI using a 2.5x20 mm Synergy DES 2. Mild nonobstructive stenosis of the LCx and RCA 3. Mildly elevated LVEDP   Echocardiogram 07/03/2019 EF 50-55, mildly increased septal wall thickness, GR 1 DD, mid to apical anteroseptum/apical anterior/apical myocardium HK, trivial TR, trivial AI, trivial PI  EKG:   06/03/2021: Sinus rhythm rate of 74 bpm.   Left axis.  Poor R wave progression, cannot exclude anteroseptal infarct old.  Inferior infarct old.  LVH.  Nonspecific T wave abnormality.  Unchanged compared to previous EKG 05/11/2021.  05/19/2021: Normal sinus rhythm at rate of 76 bpm, inferior infarct old.  Anterolateral infarct old.  LVH.  Nonspecific T abnormality diffuse.    Assessment     ICD-10-CM   1. Coronary artery disease involving native coronary artery of native heart without angina pectoris  I25.10 EKG 12-Lead    2. Precordial pain  R07.2 EKG 12-Lead       There are no discontinued medications.   No orders of the defined types were placed in this encounter.   Orders Placed This Encounter  Procedures   EKG 12-Lead   Recommendations:   Donise Woodle is a 60 y.o. African-American female patient with history of NSTEMI and CAD, LAD stenting in 2020, diet-controlled diabetes mellitus, hypertension, hyperlipidemia, chronic stage III kidney disease probably due to hypertension and diabetes mellitus presents.   Patient presents for urgent visit at her request with concerns of chest pain.  She went to the ED last night for evaluation, however she did not stay for work-up.  Notably last visit increase Farxiga from 5 mg to 10 mg daily, added losartan 25 mg daily.  Patient's symptoms are  not consistent with cardiac etiology.  EKG is unchanged compared to previous on 05/19/2021 without acute ischemic changes.  Reassured patient regarding left arm pain, likely with musculoskeletal etiology.  Counseled patient regarding signs and symptoms that would warrant urgent or emergent evaluation, she verbalized understanding and agreement.  Blood pressure is now well controlled with addition of losartan.  Patient will keep previously scheduled follow-up visit.   Alethia Berthold, PA-C 06/03/2021, 12:21 PM Office: [phone]

## 2021-06-08 ENCOUNTER — Other Ambulatory Visit: Payer: Self-pay | Admitting: Student

## 2021-06-08 DIAGNOSIS — I519 Heart disease, unspecified: Secondary | ICD-10-CM

## 2021-06-10 LAB — LIPID PANEL WITH LDL/HDL RATIO
Cholesterol, Total: 160 mg/dL (ref 100–199)
HDL: 60 mg/dL (ref >39)
LDL Chol Calc (NIH): 83 mg/dL (ref 0–99)
LDL/HDL Ratio: 1.4 ratio (ref 0.0–3.2)
Triglycerides: 91 mg/dL (ref 0–149)
VLDL Cholesterol Cal: 17 mg/dL (ref 5–40)

## 2021-06-10 LAB — BASIC METABOLIC PANEL
BUN/Creatinine Ratio: 11 (ref 9–23)
BUN: 14 mg/dL (ref 6–24)
CO2: 24 mmol/L (ref 20–29)
Calcium: 9.3 mg/dL (ref 8.7–10.2)
Chloride: 100 mmol/L (ref 96–106)
Creatinine, Ser: 1.33 mg/dL — ABNORMAL HIGH (ref 0.57–1.00)
Glucose: 110 mg/dL — ABNORMAL HIGH (ref 70–99)
Potassium: 3.9 mmol/L (ref 3.5–5.2)
Sodium: 137 mmol/L (ref 134–144)
eGFR: 47 mL/min/{1.73_m2} — ABNORMAL LOW (ref >59)

## 2021-06-10 LAB — HIGH SENSITIVITY CRP: CRP, High Sensitivity: 6.63 mg/L — ABNORMAL HIGH (ref 0.00–3.00)

## 2021-06-10 NOTE — Telephone Encounter (Signed)
From patient.

## 2021-06-21 ENCOUNTER — Other Ambulatory Visit: Payer: Self-pay

## 2021-06-21 ENCOUNTER — Ambulatory Visit: Payer: BC Managed Care – PPO

## 2021-06-21 DIAGNOSIS — I519 Heart disease, unspecified: Secondary | ICD-10-CM

## 2021-06-21 LAB — PCV MYOCARDIAL PERFUSION WO LEXISCAN: ST Depression (mm): 0 mm

## 2021-06-28 NOTE — Telephone Encounter (Signed)
From pt

## 2021-06-30 ENCOUNTER — Telehealth: Payer: Self-pay

## 2021-06-30 NOTE — Telephone Encounter (Signed)
From pt

## 2021-07-07 NOTE — H&P (View-Only) (Signed)
Primary Physician/Referring:  Leeroy Cha, MD  Patient ID: Andrea Robertson, female    DOB: 1961/03/03, 60 y.o.   MRN: [id number]  Chief Complaint  Patient presents with   Coronary Artery Disease   Results    Stress test   Follow-up   HPI:    Andrea Robertson  is a 60 y.o. African-American female patient with history of NSTEMI and CAD, LAD stenting in 2020, diet-controlled diabetes mellitus, hypertension, hyperlipidemia, chronic stage III kidney disease probably due to hypertension and diabetes mellitus.   Andrea Robertson has had a high risk nuclear stress test on 07/12/2019 which had revealed extensive scarring of the inferior wall and inferoseptal and anteroseptal wall with reduced EF at 44% but was recommended to continue medical therapy.  Andrea Robertson now presents for follow-up of shortness of breath.  Andrea Robertson had also presented with chest pain last month to the emergency room but did not wait for the work-up as it was a long delay and was seen by Ms. Cantwell, PA-C.  Andrea Robertson has not had any further recurrence of chest pain.  No PND or orthopnea or leg edema.  Past Medical History:  Diagnosis Date   AKI (acute kidney injury) ([place])    stage? 3 not followed by a kidney MD    Anxiety    Arthritis    Coronary artery disease involving native coronary artery of native heart without angina pectoris 07/04/2019   Depression    Diabetes mellitus without complication ([place])    type 2    Essential hypertension 10/30/2018   GERD (gastroesophageal reflux disease)    Hyperlipidemia 07/04/2019   Myocardial infarction [facility])    Primary localized osteoarthritis of left knee    PVC (premature ventricular contraction) 10/30/2018   S/P total knee arthroplasty, right    Shortness of breath    pt denies    Vertigo 10/30/2018   Past Surgical History:  Procedure Laterality Date   ABDOMINAL HYSTERECTOMY     CHOLECYSTECTOMY     CORONARY STENT INTERVENTION N/A 07/03/2019   Procedure: CORONARY STENT  INTERVENTION;  Surgeon: Sherren Mocha, MD;  Location: [place] [facility];  Service: Cardiovascular;  Laterality: N/A;   LEFT HEART CATH AND CORONARY ANGIOGRAPHY N/A 07/03/2019   Procedure: LEFT HEART CATH AND CORONARY ANGIOGRAPHY;  Surgeon: Sherren Mocha, MD;  Location: [place] [facility];  Service: Cardiovascular;  Laterality: N/A;   TOTAL KNEE ARTHROPLASTY Right 10/13/2015   Procedure: RIGHT TOTAL KNEE ARTHROPLASTY;  Surgeon: Latanya Maudlin, MD;  Location: [facility];  Service: Orthopedics;  Laterality: Right;   TOTAL KNEE ARTHROPLASTY Left 11/23/2020   Procedure: TOTAL KNEE ARTHROPLASTY;  Surgeon: Elsie Saas, MD;  Location: [facility];  Service: Orthopedics;  Laterality: Left;   Family History  Problem Relation Age of Onset   Heart disease Mother    Multiple myeloma Mother    Heart attack Father 41   CAD Father    Hypertension Sister    Hypertension Sister    Hypertension Sister    Heart failure Brother     Social History   Tobacco Use   Smoking status: Never   Smokeless tobacco: Never  Substance Use Topics   Alcohol use: Yes    Comment: rare   Marital Status: Married  ROS  Review of Systems  Constitutional: Negative for malaise/fatigue and weight gain.  Cardiovascular:  Positive for dyspnea on exertion (chronic, stable). Negative for chest pain, claudication, leg swelling, near-syncope, orthopnea, palpitations, paroxysmal nocturnal dyspnea and syncope.  Respiratory:  Negative for shortness of breath.   Musculoskeletal:  Positive for arthritis and back pain.       Left arm pain and numbness  Gastrointestinal:  Negative for melena.  Neurological:  Negative for dizziness.  Objective  Blood pressure 136/84, pulse 71, temperature 98.2 F (36.8 C), temperature source Temporal, resp. rate 17, height '5\' 1"'  (1.549 m), weight 183 lb 12.8 oz (83.4 kg), SpO2 96 %. Body mass index is 34.73 kg/m.  Vitals with BMI 07/08/2021 06/03/2021 05/19/2021  Height '5\' 1"'  '5\' 1"'  -  Weight 183 lbs 13  oz 184 lbs 6 oz -  BMI 62.70 35.00 -  Systolic 938 182 993  Diastolic 84 76 87  Pulse 71 76 81     Physical Exam Vitals reviewed.  Constitutional:      Appearance: Andrea Robertson is obese.  Neck:     Vascular: No carotid bruit or JVD.  Cardiovascular:     Rate and Rhythm: Normal rate and regular rhythm.     Pulses: Intact distal pulses.     Heart sounds: Normal heart sounds. No murmur heard.   No gallop.  Pulmonary:     Effort: Pulmonary effort is normal.     Breath sounds: Normal breath sounds.  Abdominal:     General: Bowel sounds are normal.     Palpations: Abdomen is soft.  Musculoskeletal:     Right lower leg: No edema.     Left lower leg: No edema.     Laboratory examination:   Recent Labs    11/11/20 1147 11/24/20 0311 03/18/21 1520 04/09/21 1619 04/19/21 0950 06/09/21 1051  NA 140 138   < > 138 141 137  K 3.8 3.8   < > 4.4 3.9 3.9  CL 103 105   < > 104 102 100  CO2 29 24   < > 18* 25 24  GLUCOSE 109* 211*   < > 116* 131* 110*  BUN 19 18   < > 30* 14 14  CREATININE 1.28* 1.24*   < > 1.76* 1.38* 1.33*  CALCIUM 9.1 8.5*   < > 9.8 9.5 9.3  GFRNONAA 49* 51*  --   --   --   --    < > = values in this interval not displayed.   CrCl cannot be calculated (Patient's most recent lab result is older than the maximum 21 days allowed.).  CMP Latest Ref Rng & Units 06/09/2021 04/19/2021 04/09/2021  Glucose 70 - 99 mg/dL 110(H) 131(H) 116(H)  BUN 6 - 24 mg/dL 14 14 30(H)  Creatinine 0.57 - 1.00 mg/dL 1.33(H) 1.38(H) 1.76(H)  Sodium 134 - 144 mmol/L 137 141 138  Potassium 3.5 - 5.2 mmol/L 3.9 3.9 4.4  Chloride 96 - 106 mmol/L 100 102 104  CO2 20 - 29 mmol/L 24 25 18(L)  Calcium 8.7 - 10.2 mg/dL 9.3 9.5 9.8  Total Protein 6.5 - 8.1 g/dL - - -  Total Bilirubin 0.3 - 1.2 mg/dL - - -  Alkaline Phos 38 - 126 U/L - - -  AST 15 - 41 U/L - - -  ALT 0 - 44 U/L - - -   CBC Latest Ref Rng & Units 11/24/2020 11/11/2020 08/08/2019  WBC 4.0 - 10.5 K/uL 19.0(H) 9.7 11.4(H)  Hemoglobin 12.0  - 15.0 g/dL 11.5(L) 13.8 11.9(L)  Hematocrit 36.0 - 46.0 % 34.8(L) 41.9 36.9  Platelets 150 - 400 K/uL 242 310 358    Lipid Panel Recent Labs    06/09/21  1051  CHOL 160  TRIG 91  LDLCALC 83  HDL 60   Lipid Panel     Component Value Date/Time   CHOL 160 06/09/2021 1051   TRIG 91 06/09/2021 1051   HDL 60 06/09/2021 1051   CHOLHDL 4.2 07/04/2019 0912   VLDL 37 07/04/2019 0912   LDLCALC 83 06/09/2021 1051   LABVLDL 17 06/09/2021 1051     HEMOGLOBIN A1C Lab Results  Component Value Date   HGBA1C 6.8 (H) 11/11/2020   MPG 148.46 11/11/2020   TSH No results for input(s): TSH in the last 8760 hours.  External labs:   Cholesterol, total 155.000 m 12/15/2020 HDL 57.000 mg 12/15/2020 LDL 82.000 mg 12/15/2020 Triglycerides 88.000 mg 12/15/2020  A1C 6.600 mg/ 12/15/2020  Creatinine, Serum 1.380 mg/ 04/19/2021 Potassium 3.900 mm 04/19/2021  Allergies   Allergies  Allergen Reactions   Dilaudid [Hydromorphone Hcl] Hives    Can tolerate if premedicated with benadryl   Erythromycin Nausea And Vomiting   Penicillins Hives and Nausea And Vomiting    Has patient had a PCN reaction causing immediate rash, facial/tongue/throat swelling, SOB or lightheadedness with hypotension: No Has patient had a PCN reaction causing severe rash involving mucus membranes or skin necrosis: No Has patient had a PCN reaction that required hospitalization No Has patient had a PCN reaction occurring within the last 10 years: Yes If all of the above answers are "NO", then may proceed with Cephalosporin use. Other reaction(s): hives/vomiting   Percocet [Oxycodone-Acetaminophen] Itching and Nausea And Vomiting    Can tolerate with premedication of diphenhydramine   Zithromax [Azithromycin] Nausea And Vomiting   Cefprozil     Other reaction(s): rash but takes Ceftin   Codeine     Other reaction(s): nausea   Oxycodone     Other reaction(s): nausea, epigastric pain     Medication prior to this  encounter:   Outpatient Medications Prior to Visit  Medication Sig Dispense Refill   Accu-Chek FastClix Lancets MISC USE TO TEST YOUR BLOOD SUGAR TWICE A DAY DX E11.21     acetaminophen (TYLENOL) 500 MG tablet Take 2 tablets (1,000 mg total) by mouth every 8 (eight) hours as needed. 30 tablet 0   aspirin EC 81 MG EC tablet Take 1 tablet (81 mg total) by mouth daily. 90 tablet 3   atorvastatin (LIPITOR) 80 MG tablet Take 1 tablet (80 mg total) by mouth daily at 6 PM. 90 tablet 3   buPROPion (WELLBUTRIN XL) 300 MG 24 hr tablet Take 300 mg by mouth daily.     carvedilol (COREG) 25 MG tablet Take 1 tablet (25 mg total) by mouth 2 (two) times daily. 180 tablet 3   cephALEXin (KEFLEX) 500 MG capsule Take 500 mg by mouth 4 (four) times daily.     dapagliflozin propanediol (FARXIGA) 10 MG TABS tablet Take 1 tablet (10 mg total) by mouth daily before breakfast. 90 tablet 3   docusate sodium (COLACE) 100 MG capsule Take 1 capsule by mouth daily.     escitalopram (LEXAPRO) 10 MG tablet Take 10 mg by mouth daily.     fexofenadine (ALLEGRA) 180 MG tablet Take 180 mg by mouth daily.     fluticasone (FLONASE) 50 MCG/ACT nasal spray Place 1 spray into both nostrils daily.     guaiFENesin (MUCINEX) 600 MG 12 hr tablet Take 1 tablet by mouth as needed.     losartan (COZAAR) 25 MG tablet Take 1 tablet by mouth daily.     meclizine (ANTIVERT)  25 MG tablet Take 1 tablet (25 mg total) by mouth 3 (three) times daily as needed for dizziness. 30 tablet 0   methylPREDNISolone (MEDROL DOSEPAK) 4 MG TBPK tablet See admin instructions.     Multiple Vitamin (MULTIVITAMIN WITH MINERALS) TABS tablet Take 1 tablet by mouth daily.     amLODipine (NORVASC) 5 MG tablet Take 1 tablet (5 mg total) by mouth daily. 90 tablet 3   losartan (COZAAR) 25 MG tablet Take 1 tablet (25 mg total) by mouth every evening. 30 tablet 2   nitroGLYCERIN (NITROSTAT) 0.4 MG SL tablet Place 1 tablet (0.4 mg total) under the tongue every 5 (five)  minutes x 3 doses as needed for chest pain. 25 tablet 1   cephALEXin (KEFLEX) 500 MG capsule Take 500 mg by mouth 2 (two) times daily.     No facility-administered medications prior to visit.     Medication list after today's encounter   Current Outpatient Medications  Medication Instructions   Accu-Chek FastClix Lancets MISC USE TO TEST YOUR BLOOD SUGAR TWICE A DAY DX E11.21   acetaminophen (TYLENOL) 1,000 mg, Oral, Every 8 hours PRN   amLODipine (NORVASC) 10 mg, Oral, Daily   aspirin 81 mg, Oral, Daily   atorvastatin (LIPITOR) 80 mg, Oral, Daily-1800   buPROPion (WELLBUTRIN XL) 300 mg, Oral, Daily   carvedilol (COREG) 25 mg, Oral, 2 times daily   cephALEXin (KEFLEX) 500 mg, Oral, 4 times daily   dapagliflozin propanediol (FARXIGA) 10 mg, Oral, Daily before breakfast   docusate sodium (COLACE) 100 MG capsule 1 capsule, Oral, Daily   escitalopram (LEXAPRO) 10 mg, Oral, Daily   ezetimibe (ZETIA) 10 mg, Oral, Daily   fexofenadine (ALLEGRA) 180 mg, Oral, Daily   fluticasone (FLONASE) 50 MCG/ACT nasal spray 1 spray, Each Nare, Daily   guaiFENesin (MUCINEX) 600 MG 12 hr tablet 1 tablet, Oral, As needed   losartan (COZAAR) 25 MG tablet 1 tablet, Oral, Daily   meclizine (ANTIVERT) 25 mg, Oral, 3 times daily PRN   methylPREDNISolone (MEDROL DOSEPAK) 4 MG TBPK tablet See admin instructions   Multiple Vitamin (MULTIVITAMIN WITH MINERALS) TABS tablet 1 tablet, Oral, Daily   nitroGLYCERIN (NITROSTAT) 0.4 mg, Sublingual, Every 5 min x3 PRN   Radiology:   No results found.  Cardiac Studies:   Event monitor 08/27/2019 14 days Normal sinus rhythm, sinus tachycardia; no atrial fibrillation   Cardiac catheterization 07/03/2019 1. Total occlusion of the mid-LAD, treated successfully with PCI using a 2.5x20 mm Synergy DES 2. Mild nonobstructive stenosis of the LCx and RCA 3. Mildly elevated LVEDP   PCV ECHOCARDIOGRAM COMPLETE 06/01/2021  Narrative Echocardiogram 06/01/2021: Left ventricle  cavity is normal in size. Normal global wall motion. Akinesis of mid to distal anteroseptal, anteroapical, apical, infroaepical regions. LVEF 25-30%. Although no evidence on 2D images, cannot exclude LV apical thrombus, in absence of contrast use. Doppler evidence of grade I (impaired) diastolic dysfunction, normal LAP. Structurally normal trileaflet aortic valve. Mild (Grade I) aortic regurgitation. Mild (Grade I) mitral regurgitation. Mild pulmonic regurgitation. Normal right atrial pressure. Wall motion abnormality more prominent, compared to 06/2019 study. LVEF has reduced from 50-55%.    PCV MYOCARDIAL PERFUSION WO LEXISCAN 06/21/2021  Narrative Lexiscan/modified Bruce Tetrofosmin stress test 06/21/2021: Lexiscan/modified Bruce nuclear stress test performed using 1-day protocol. Patient reached 7 METS, 64% MPHR. In addition, stress EKG showed sinus tachycardia, inferolateral infarct, no acute ischemic changes. SPECT Images showed medium sized, severe intensity, predominantly fixed perfusion defect with minimal peri-infarct ischemia in apical to  basal, inferior/inferoseptal myocardium, associated with absent myocardial thickening. Stress LVEF 35%. Compared to the study done on 06/2020, no significant change.  Previously noted inferior wall scar is unchanged. Anterior and anterolateral scar with minimal reversible defect not noted in the present study. EF was previously calculated at 44%.  High risk study.    EKG:   06/03/2021: Sinus rhythm rate of 74 bpm.  Left axis.  Poor R wave progression, cannot exclude anteroseptal infarct old.  Inferior infarct old.  LVH.  Nonspecific T wave abnormality.  Unchanged compared to previous EKG 05/11/2021.  05/19/2021: Normal sinus rhythm at rate of 76 bpm, inferior infarct old.  Anterolateral infarct old.  LVH.  Nonspecific T abnormality diffuse.    Assessment     ICD-10-CM   1. Coronary artery disease of native artery of native heart with stable angina  pectoris ([place])  I25.118 nitroGLYCERIN (NITROSTAT) 0.4 MG SL tablet    Ambulatory referral to [facility]    2. Essential hypertension  J19 Basic metabolic panel    CBC    amLODipine (NORVASC) 10 MG tablet    3. DOE (dyspnea on exertion)  R06.09     4. Pure hypercholesterolemia  E78.00 ezetimibe (ZETIA) 10 MG tablet    5. Type 2 diabetes mellitus with stage 3a chronic kidney disease, without long-term current use of insulin ([place])  E11.22 Ambulatory referral to [facility]   N18.31        Medications Discontinued During This Encounter  Medication Reason   cephALEXin (KEFLEX) 500 MG capsule    losartan (COZAAR) 25 MG tablet Duplicate   nitroGLYCERIN (NITROSTAT) 0.4 MG SL tablet Reorder   amLODipine (NORVASC) 5 MG tablet Reorder     Meds ordered this encounter  Medications   ezetimibe (ZETIA) 10 MG tablet    Sig: Take 1 tablet (10 mg total) by mouth daily.    Dispense:  90 tablet    Refill:  3   nitroGLYCERIN (NITROSTAT) 0.4 MG SL tablet    Sig: Place 1 tablet (0.4 mg total) under the tongue every 5 (five) minutes x 3 doses as needed for chest pain.    Dispense:  25 tablet    Refill:  1   amLODipine (NORVASC) 10 MG tablet    Sig: Take 1 tablet (10 mg total) by mouth daily.    Dispense:  90 tablet    Refill:  3     Orders Placed This Encounter  Procedures   Basic metabolic panel   CBC   Ambulatory referral to [facility]    Referral Priority:   Routine    Referral Type:   Consultation    Referral Reason:   Specialty Services Required    Number of Visits Requested:   1   Recommendations:   Aprill Banko is a 60 y.o. African-American female patient with history of NSTEMI and CAD, LAD stenting in 2020, diet-controlled diabetes mellitus, hypertension, hyperlipidemia, chronic stage III kidney disease probably due to hypertension and diabetes mellitus.   Andrea Robertson has had a high risk nuclear stress test on  07/12/2019 which had revealed extensive scarring of the inferior wall and inferoseptal and anteroseptal wall with reduced EF at 44% but was recommended to continue medical therapy.  In view of her dyspnea, I have repeated echocardiogram and also nuclear stress test.  Her ejection fraction is further decreased.  In 2020 when Andrea Robertson had PCI to the LAD, her echocardiogram was normal and there was  no echocardiogram performed since then.  In view of dyspnea on exertion, high risk nuclear stress test although does not reveal significant ischemia, significant change in her LVEF and with her risk factors including diabetes mellitus, hypertension and hyperlipidemia I have recommended right and left heart catheterization.  I also spoke with her sister over the phone in front of the patient regarding risks and benefits of cardiac catheterization. Schedule for cardiac catheterization, and possible angioplasty. We discussed regarding risks, benefits, alternatives to this including stress testing, CTA and continued medical therapy. Patient wants to proceed. Understands <1-2% risk of death, stroke, MI, urgent CABG, bleeding, infection, renal failure but not limited to these.  With regard to hypertension, blood pressure is elevated today, will increase amlodipine from 5 mg to 10 mg daily.  I refilled her nitroglycerin prescription.  I will see her back after the cardiac catheterization and make further recommendations.  This was a 40-minute office visit encounter in personal review of her images from prior stress test, comparison to the present stress test with the patient, discussion with her sister over the phone and coordination of care.   Andrea Prows, PA-C 07/08/2021, 5:15 PM Office: [phone]

## 2021-07-07 NOTE — Progress Notes (Signed)
Primary Physician/Referring:  Leeroy Cha, MD  Patient ID: Andrea Robertson, female    DOB: [birth year], 60 y.o.   MRN: [id number]  Chief Complaint  Patient presents with   Coronary Artery Disease   Results    Stress test   Follow-up   HPI:    Andrea Robertson  is a 60 y.o. African-American female patient with history of NSTEMI and CAD, LAD stenting in 2020, diet-controlled diabetes mellitus, hypertension, hyperlipidemia, chronic stage III kidney disease probably due to hypertension and diabetes mellitus.   She has had a high risk nuclear stress test on 07/12/2019 which had revealed extensive scarring of the inferior wall and inferoseptal and anteroseptal wall with reduced EF at 44% but was recommended to continue medical therapy.  She now presents for follow-up of shortness of breath.  She had also presented with chest pain last month to the emergency room but did not wait for the work-up as it was a long delay and was seen by Ms. Cantwell, PA-C.  She has not had any further recurrence of chest pain.  No PND or orthopnea or leg edema.  Past Medical History:  Diagnosis Date   AKI (acute kidney injury) ([place])    stage? 3 not followed by a kidney MD    Anxiety    Arthritis    Coronary artery disease involving native coronary artery of native heart without angina pectoris 07/04/2019   Depression    Diabetes mellitus without complication ([place])    type 2    Essential hypertension 10/30/2018   GERD (gastroesophageal reflux disease)    Hyperlipidemia 07/04/2019   Myocardial infarction [facility])    Primary localized osteoarthritis of left knee    PVC (premature ventricular contraction) 10/30/2018   S/P total knee arthroplasty, right    Shortness of breath    pt denies    Vertigo 10/30/2018   Past Surgical History:  Procedure Laterality Date   ABDOMINAL HYSTERECTOMY     CHOLECYSTECTOMY     CORONARY STENT INTERVENTION N/A 07/03/2019   Procedure: CORONARY STENT  INTERVENTION;  Surgeon: Sherren Mocha, MD;  Location: [facility];  Service: Cardiovascular;  Laterality: N/A;   LEFT HEART CATH AND CORONARY ANGIOGRAPHY N/A 07/03/2019   Procedure: LEFT HEART CATH AND CORONARY ANGIOGRAPHY;  Surgeon: Sherren Mocha, MD;  Location: [facility];  Service: Cardiovascular;  Laterality: N/A;   TOTAL KNEE ARTHROPLASTY Right 10/13/2015   Procedure: RIGHT TOTAL KNEE ARTHROPLASTY;  Surgeon: Latanya Maudlin, MD;  Location: [facility];  Service: Orthopedics;  Laterality: Right;   TOTAL KNEE ARTHROPLASTY Left 11/23/2020   Procedure: TOTAL KNEE ARTHROPLASTY;  Surgeon: Elsie Saas, MD;  Location: [facility];  Service: Orthopedics;  Laterality: Left;   Family History  Problem Relation Age of Onset   Heart disease Mother    Multiple myeloma Mother    Heart attack Father 27   CAD Father    Hypertension Sister    Hypertension Sister    Hypertension Sister    Heart failure Brother     Social History   Tobacco Use   Smoking status: Never   Smokeless tobacco: Never  Substance Use Topics   Alcohol use: Yes    Comment: rare   Marital Status: Married  ROS  Review of Systems  Constitutional: Negative for malaise/fatigue and weight gain.  Cardiovascular:  Positive for dyspnea on exertion (chronic, stable). Negative for chest pain, claudication, leg swelling, near-syncope, orthopnea, palpitations, paroxysmal nocturnal dyspnea and syncope.  Respiratory:  Negative for shortness of breath.   Musculoskeletal:  Positive for arthritis and back pain.       Left arm pain and numbness  Gastrointestinal:  Negative for melena.  Neurological:  Negative for dizziness.  Objective  Blood pressure 136/84, pulse 71, temperature 98.2 F (36.8 C), temperature source Temporal, resp. rate 17, height '5\' 1"'  (1.549 m), weight 183 lb 12.8 oz (83.4 kg), SpO2 96 %. Body mass index is 34.73 kg/m.  Vitals with BMI 07/08/2021 06/03/2021 05/19/2021  Height '5\' 1"'  '5\' 1"'  -  Weight 183 lbs 13  oz 184 lbs 6 oz -  BMI 03.55 97.41 -  Systolic 638 453 646  Diastolic 84 76 87  Pulse 71 76 81     Physical Exam Vitals reviewed.  Constitutional:      Appearance: She is obese.  Neck:     Vascular: No carotid bruit or JVD.  Cardiovascular:     Rate and Rhythm: Normal rate and regular rhythm.     Pulses: Intact distal pulses.     Heart sounds: Normal heart sounds. No murmur heard.   No gallop.  Pulmonary:     Effort: Pulmonary effort is normal.     Breath sounds: Normal breath sounds.  Abdominal:     General: Bowel sounds are normal.     Palpations: Abdomen is soft.  Musculoskeletal:     Right lower leg: No edema.     Left lower leg: No edema.     Laboratory examination:   Recent Labs    11/11/20 1147 11/24/20 0311 03/18/21 1520 04/09/21 1619 04/19/21 0950 06/09/21 1051  NA 140 138   < > 138 141 137  K 3.8 3.8   < > 4.4 3.9 3.9  CL 103 105   < > 104 102 100  CO2 29 24   < > 18* 25 24  GLUCOSE 109* 211*   < > 116* 131* 110*  BUN 19 18   < > 30* 14 14  CREATININE 1.28* 1.24*   < > 1.76* 1.38* 1.33*  CALCIUM 9.1 8.5*   < > 9.8 9.5 9.3  GFRNONAA 49* 51*  --   --   --   --    < > = values in this interval not displayed.   CrCl cannot be calculated (Patient's most recent lab result is older than the maximum 21 days allowed.).  CMP Latest Ref Rng & Units 06/09/2021 04/19/2021 04/09/2021  Glucose 70 - 99 mg/dL 110(H) 131(H) 116(H)  BUN 6 - 24 mg/dL 14 14 30(H)  Creatinine 0.57 - 1.00 mg/dL 1.33(H) 1.38(H) 1.76(H)  Sodium 134 - 144 mmol/L 137 141 138  Potassium 3.5 - 5.2 mmol/L 3.9 3.9 4.4  Chloride 96 - 106 mmol/L 100 102 104  CO2 20 - 29 mmol/L 24 25 18(L)  Calcium 8.7 - 10.2 mg/dL 9.3 9.5 9.8  Total Protein 6.5 - 8.1 g/dL - - -  Total Bilirubin 0.3 - 1.2 mg/dL - - -  Alkaline Phos 38 - 126 U/L - - -  AST 15 - 41 U/L - - -  ALT 0 - 44 U/L - - -   CBC Latest Ref Rng & Units 11/24/2020 11/11/2020 08/08/2019  WBC 4.0 - 10.5 K/uL 19.0(H) 9.7 11.4(H)  Hemoglobin 12.0  - 15.0 g/dL 11.5(L) 13.8 11.9(L)  Hematocrit 36.0 - 46.0 % 34.8(L) 41.9 36.9  Platelets 150 - 400 K/uL 242 310 358    Lipid Panel Recent Labs    06/09/21  1051  CHOL 160  TRIG 91  LDLCALC 83  HDL 60   Lipid Panel     Component Value Date/Time   CHOL 160 06/09/2021 1051   TRIG 91 06/09/2021 1051   HDL 60 06/09/2021 1051   CHOLHDL 4.2 07/04/2019 0912   VLDL 37 07/04/2019 0912   LDLCALC 83 06/09/2021 1051   LABVLDL 17 06/09/2021 1051     HEMOGLOBIN A1C Lab Results  Component Value Date   HGBA1C 6.8 (H) 11/11/2020   MPG 148.46 11/11/2020   TSH No results for input(s): TSH in the last 8760 hours.  External labs:   Cholesterol, total 155.000 m 12/15/2020 HDL 57.000 mg 12/15/2020 LDL 82.000 mg 12/15/2020 Triglycerides 88.000 mg 12/15/2020  A1C 6.600 mg/ 12/15/2020  Creatinine, Serum 1.380 mg/ 04/19/2021 Potassium 3.900 mm 04/19/2021  Allergies   Allergies  Allergen Reactions   Dilaudid [Hydromorphone Hcl] Hives    Can tolerate if premedicated with benadryl   Erythromycin Nausea And Vomiting   Penicillins Hives and Nausea And Vomiting    Has patient had a PCN reaction causing immediate rash, facial/tongue/throat swelling, SOB or lightheadedness with hypotension: No Has patient had a PCN reaction causing severe rash involving mucus membranes or skin necrosis: No Has patient had a PCN reaction that required hospitalization No Has patient had a PCN reaction occurring within the last 10 years: Yes If all of the above answers are "NO", then may proceed with Cephalosporin use. Other reaction(s): hives/vomiting   Percocet [Oxycodone-Acetaminophen] Itching and Nausea And Vomiting    Can tolerate with premedication of diphenhydramine   Zithromax [Azithromycin] Nausea And Vomiting   Cefprozil     Other reaction(s): rash but takes Ceftin   Codeine     Other reaction(s): nausea   Oxycodone     Other reaction(s): nausea, epigastric pain     Medication prior to this  encounter:   Outpatient Medications Prior to Visit  Medication Sig Dispense Refill   Accu-Chek FastClix Lancets MISC USE TO TEST YOUR BLOOD SUGAR TWICE A DAY DX E11.21     acetaminophen (TYLENOL) 500 MG tablet Take 2 tablets (1,000 mg total) by mouth every 8 (eight) hours as needed. 30 tablet 0   aspirin EC 81 MG EC tablet Take 1 tablet (81 mg total) by mouth daily. 90 tablet 3   atorvastatin (LIPITOR) 80 MG tablet Take 1 tablet (80 mg total) by mouth daily at 6 PM. 90 tablet 3   buPROPion (WELLBUTRIN XL) 300 MG 24 hr tablet Take 300 mg by mouth daily.     carvedilol (COREG) 25 MG tablet Take 1 tablet (25 mg total) by mouth 2 (two) times daily. 180 tablet 3   cephALEXin (KEFLEX) 500 MG capsule Take 500 mg by mouth 4 (four) times daily.     dapagliflozin propanediol (FARXIGA) 10 MG TABS tablet Take 1 tablet (10 mg total) by mouth daily before breakfast. 90 tablet 3   docusate sodium (COLACE) 100 MG capsule Take 1 capsule by mouth daily.     escitalopram (LEXAPRO) 10 MG tablet Take 10 mg by mouth daily.     fexofenadine (ALLEGRA) 180 MG tablet Take 180 mg by mouth daily.     fluticasone (FLONASE) 50 MCG/ACT nasal spray Place 1 spray into both nostrils daily.     guaiFENesin (MUCINEX) 600 MG 12 hr tablet Take 1 tablet by mouth as needed.     losartan (COZAAR) 25 MG tablet Take 1 tablet by mouth daily.     meclizine (ANTIVERT)  25 MG tablet Take 1 tablet (25 mg total) by mouth 3 (three) times daily as needed for dizziness. 30 tablet 0   methylPREDNISolone (MEDROL DOSEPAK) 4 MG TBPK tablet See admin instructions.     Multiple Vitamin (MULTIVITAMIN WITH MINERALS) TABS tablet Take 1 tablet by mouth daily.     amLODipine (NORVASC) 5 MG tablet Take 1 tablet (5 mg total) by mouth daily. 90 tablet 3   losartan (COZAAR) 25 MG tablet Take 1 tablet (25 mg total) by mouth every evening. 30 tablet 2   nitroGLYCERIN (NITROSTAT) 0.4 MG SL tablet Place 1 tablet (0.4 mg total) under the tongue every 5 (five)  minutes x 3 doses as needed for chest pain. 25 tablet 1   cephALEXin (KEFLEX) 500 MG capsule Take 500 mg by mouth 2 (two) times daily.     No facility-administered medications prior to visit.     Medication list after today's encounter   Current Outpatient Medications  Medication Instructions   Accu-Chek FastClix Lancets MISC USE TO TEST YOUR BLOOD SUGAR TWICE A DAY DX E11.21   acetaminophen (TYLENOL) 1,000 mg, Oral, Every 8 hours PRN   amLODipine (NORVASC) 10 mg, Oral, Daily   aspirin 81 mg, Oral, Daily   atorvastatin (LIPITOR) 80 mg, Oral, Daily-1800   buPROPion (WELLBUTRIN XL) 300 mg, Oral, Daily   carvedilol (COREG) 25 mg, Oral, 2 times daily   cephALEXin (KEFLEX) 500 mg, Oral, 4 times daily   dapagliflozin propanediol (FARXIGA) 10 mg, Oral, Daily before breakfast   docusate sodium (COLACE) 100 MG capsule 1 capsule, Oral, Daily   escitalopram (LEXAPRO) 10 mg, Oral, Daily   ezetimibe (ZETIA) 10 mg, Oral, Daily   fexofenadine (ALLEGRA) 180 mg, Oral, Daily   fluticasone (FLONASE) 50 MCG/ACT nasal spray 1 spray, Each Nare, Daily   guaiFENesin (MUCINEX) 600 MG 12 hr tablet 1 tablet, Oral, As needed   losartan (COZAAR) 25 MG tablet 1 tablet, Oral, Daily   meclizine (ANTIVERT) 25 mg, Oral, 3 times daily PRN   methylPREDNISolone (MEDROL DOSEPAK) 4 MG TBPK tablet See admin instructions   Multiple Vitamin (MULTIVITAMIN WITH MINERALS) TABS tablet 1 tablet, Oral, Daily   nitroGLYCERIN (NITROSTAT) 0.4 mg, Sublingual, Every 5 min x3 PRN   Radiology:   No results found.  Cardiac Studies:   Event monitor 08/27/2019 14 days Normal sinus rhythm, sinus tachycardia; no atrial fibrillation   Cardiac catheterization 07/03/2019 1. Total occlusion of the mid-LAD, treated successfully with PCI using a 2.5x20 mm Synergy DES 2. Mild nonobstructive stenosis of the LCx and RCA 3. Mildly elevated LVEDP   PCV ECHOCARDIOGRAM COMPLETE 06/01/2021  Narrative Echocardiogram 06/01/2021: Left ventricle  cavity is normal in size. Normal global wall motion. Akinesis of mid to distal anteroseptal, anteroapical, apical, infroaepical regions. LVEF 25-30%. Although no evidence on 2D images, cannot exclude LV apical thrombus, in absence of contrast use. Doppler evidence of grade I (impaired) diastolic dysfunction, normal LAP. Structurally normal trileaflet aortic valve. Mild (Grade I) aortic regurgitation. Mild (Grade I) mitral regurgitation. Mild pulmonic regurgitation. Normal right atrial pressure. Wall motion abnormality more prominent, compared to 06/2019 study. LVEF has reduced from 50-55%.    PCV MYOCARDIAL PERFUSION WO LEXISCAN 06/21/2021  Narrative Lexiscan/modified Bruce Tetrofosmin stress test 06/21/2021: Lexiscan/modified Bruce nuclear stress test performed using 1-day protocol. Patient reached 7 METS, 64% MPHR. In addition, stress EKG showed sinus tachycardia, inferolateral infarct, no acute ischemic changes. SPECT Images showed medium sized, severe intensity, predominantly fixed perfusion defect with minimal peri-infarct ischemia in apical to  basal, inferior/inferoseptal myocardium, associated with absent myocardial thickening. Stress LVEF 35%. Compared to the study done on 06/2020, no significant change.  Previously noted inferior wall scar is unchanged. Anterior and anterolateral scar with minimal reversible defect not noted in the present study. EF was previously calculated at 44%.  High risk study.    EKG:   06/03/2021: Sinus rhythm rate of 74 bpm.  Left axis.  Poor R wave progression, cannot exclude anteroseptal infarct old.  Inferior infarct old.  LVH.  Nonspecific T wave abnormality.  Unchanged compared to previous EKG 05/11/2021.  05/19/2021: Normal sinus rhythm at rate of 76 bpm, inferior infarct old.  Anterolateral infarct old.  LVH.  Nonspecific T abnormality diffuse.    Assessment     ICD-10-CM   1. Coronary artery disease of native artery of native heart with stable angina  pectoris ([place])  I25.118 nitroGLYCERIN (NITROSTAT) 0.4 MG SL tablet    Ambulatory referral to [facility]    2. Essential hypertension  W46 Basic metabolic panel    CBC    amLODipine (NORVASC) 10 MG tablet    3. DOE (dyspnea on exertion)  R06.09     4. Pure hypercholesterolemia  E78.00 ezetimibe (ZETIA) 10 MG tablet    5. Type 2 diabetes mellitus with stage 3a chronic kidney disease, without long-term current use of insulin ([place])  E11.22 Ambulatory referral to [facility]   N18.31        Medications Discontinued During This Encounter  Medication Reason   cephALEXin (KEFLEX) 500 MG capsule    losartan (COZAAR) 25 MG tablet Duplicate   nitroGLYCERIN (NITROSTAT) 0.4 MG SL tablet Reorder   amLODipine (NORVASC) 5 MG tablet Reorder     Meds ordered this encounter  Medications   ezetimibe (ZETIA) 10 MG tablet    Sig: Take 1 tablet (10 mg total) by mouth daily.    Dispense:  90 tablet    Refill:  3   nitroGLYCERIN (NITROSTAT) 0.4 MG SL tablet    Sig: Place 1 tablet (0.4 mg total) under the tongue every 5 (five) minutes x 3 doses as needed for chest pain.    Dispense:  25 tablet    Refill:  1   amLODipine (NORVASC) 10 MG tablet    Sig: Take 1 tablet (10 mg total) by mouth daily.    Dispense:  90 tablet    Refill:  3     Orders Placed This Encounter  Procedures   Basic metabolic panel   CBC   Ambulatory referral to [facility]    Referral Priority:   Routine    Referral Type:   Consultation    Referral Reason:   Specialty Services Required    Number of Visits Requested:   1   Recommendations:   Eileen Kangas is a 60 y.o. African-American female patient with history of NSTEMI and CAD, LAD stenting in 2020, diet-controlled diabetes mellitus, hypertension, hyperlipidemia, chronic stage III kidney disease probably due to hypertension and diabetes mellitus.   She has had a high risk nuclear stress test on  07/12/2019 which had revealed extensive scarring of the inferior wall and inferoseptal and anteroseptal wall with reduced EF at 44% but was recommended to continue medical therapy.  In view of her dyspnea, I have repeated echocardiogram and also nuclear stress test.  Her ejection fraction is further decreased.  In 2020 when she had PCI to the LAD, her echocardiogram was normal and there was  no echocardiogram performed since then.  In view of dyspnea on exertion, high risk nuclear stress test although does not reveal significant ischemia, significant change in her LVEF and with her risk factors including diabetes mellitus, hypertension and hyperlipidemia I have recommended right and left heart catheterization.  I also spoke with her sister over the phone in front of the patient regarding risks and benefits of cardiac catheterization. Schedule for cardiac catheterization, and possible angioplasty. We discussed regarding risks, benefits, alternatives to this including stress testing, CTA and continued medical therapy. Patient wants to proceed. Understands <1-2% risk of death, stroke, MI, urgent CABG, bleeding, infection, renal failure but not limited to these.  With regard to hypertension, blood pressure is elevated today, will increase amlodipine from 5 mg to 10 mg daily.  I refilled her nitroglycerin prescription.  I will see her back after the cardiac catheterization and make further recommendations.  This was a 40-minute office visit encounter in personal review of her images from prior stress test, comparison to the present stress test with the patient, discussion with her sister over the phone and coordination of care.   Adrian Prows, PA-C 07/08/2021, 5:15 PM Office: [phone]

## 2021-07-08 ENCOUNTER — Encounter: Payer: Self-pay | Admitting: Cardiology

## 2021-07-08 ENCOUNTER — Ambulatory Visit: Payer: BC Managed Care – PPO | Admitting: Cardiology

## 2021-07-08 ENCOUNTER — Other Ambulatory Visit: Payer: Self-pay

## 2021-07-08 VITALS — BP 136/84 | HR 71 | Temp 98.2°F | Resp 17 | Ht 61.0 in | Wt 183.8 lb

## 2021-07-08 DIAGNOSIS — N1831 Chronic kidney disease, stage 3a: Secondary | ICD-10-CM

## 2021-07-08 DIAGNOSIS — I25118 Atherosclerotic heart disease of native coronary artery with other forms of angina pectoris: Secondary | ICD-10-CM

## 2021-07-08 DIAGNOSIS — R0609 Other forms of dyspnea: Secondary | ICD-10-CM

## 2021-07-08 DIAGNOSIS — I10 Essential (primary) hypertension: Secondary | ICD-10-CM

## 2021-07-08 DIAGNOSIS — E7800 Pure hypercholesterolemia, unspecified: Secondary | ICD-10-CM

## 2021-07-08 MED ORDER — AMLODIPINE BESYLATE 10 MG PO TABS: 10.0000 mg | ORAL_TABLET | Freq: Every day | ORAL | 3 refills | Status: DC

## 2021-07-08 MED ORDER — EZETIMIBE 10 MG PO TABS: 10.0000 mg | ORAL_TABLET | Freq: Every day | ORAL | 3 refills | Status: DC

## 2021-07-08 MED ORDER — NITROGLYCERIN 0.4 MG SL SUBL: 0.4000 mg | SUBLINGUAL_TABLET | SUBLINGUAL | 1 refills | Status: DC | PRN

## 2021-07-21 ENCOUNTER — Ambulatory Visit: Payer: BC Managed Care – PPO | Admitting: Cardiology

## 2021-07-27 ENCOUNTER — Ambulatory Visit (HOSPITAL_COMMUNITY)
Admission: RE | Admit: 2021-07-27 | Discharge: 2021-07-27 | Disposition: A | Discharge status: Home / Self Care | Payer: BC Managed Care – PPO | Attending: Cardiology | Admitting: Cardiology

## 2021-07-27 ENCOUNTER — Encounter (HOSPITAL_COMMUNITY): Payer: Self-pay | Admitting: Cardiology

## 2021-07-27 ENCOUNTER — Encounter (HOSPITAL_COMMUNITY)
Admission: RE | Disposition: A | Discharge status: Home / Self Care | Payer: Self-pay | Source: Home / Self Care | Attending: Cardiology

## 2021-07-27 DIAGNOSIS — E1122 Type 2 diabetes mellitus with diabetic chronic kidney disease: Secondary | ICD-10-CM | POA: Insufficient documentation

## 2021-07-27 DIAGNOSIS — I252 Old myocardial infarction: Secondary | ICD-10-CM | POA: Diagnosis not present

## 2021-07-27 DIAGNOSIS — N1831 Chronic kidney disease, stage 3a: Secondary | ICD-10-CM | POA: Insufficient documentation

## 2021-07-27 DIAGNOSIS — I129 Hypertensive chronic kidney disease with stage 1 through stage 4 chronic kidney disease, or unspecified chronic kidney disease: Secondary | ICD-10-CM | POA: Insufficient documentation

## 2021-07-27 DIAGNOSIS — I25118 Atherosclerotic heart disease of native coronary artery with other forms of angina pectoris: Secondary | ICD-10-CM | POA: Diagnosis present

## 2021-07-27 DIAGNOSIS — I255 Ischemic cardiomyopathy: Secondary | ICD-10-CM

## 2021-07-27 DIAGNOSIS — R0609 Other forms of dyspnea: Secondary | ICD-10-CM | POA: Insufficient documentation

## 2021-07-27 DIAGNOSIS — I209 Angina pectoris, unspecified: Secondary | ICD-10-CM | POA: Diagnosis present

## 2021-07-27 DIAGNOSIS — E7800 Pure hypercholesterolemia, unspecified: Secondary | ICD-10-CM | POA: Insufficient documentation

## 2021-07-27 DIAGNOSIS — I2510 Atherosclerotic heart disease of native coronary artery without angina pectoris: Secondary | ICD-10-CM | POA: Diagnosis present

## 2021-07-27 DIAGNOSIS — Z955 Presence of coronary angioplasty implant and graft: Secondary | ICD-10-CM | POA: Diagnosis not present

## 2021-07-27 HISTORY — PX: RIGHT/LEFT HEART CATH AND CORONARY ANGIOGRAPHY: CATH118266

## 2021-07-27 LAB — CBC
HCT: 40.6 % (ref 36.0–46.0)
Hemoglobin: 13.1 g/dL (ref 12.0–15.0)
MCH: 27.3 pg (ref 26.0–34.0)
MCHC: 32.3 g/dL (ref 30.0–36.0)
MCV: 84.8 fL (ref 80.0–100.0)
Platelets: 268 10*3/uL (ref 150–400)
RBC: 4.79 MIL/uL (ref 3.87–5.11)
RDW: 14.7 % (ref 11.5–15.5)
WBC: 9.7 10*3/uL (ref 4.0–10.5)
nRBC: 0 % (ref 0.0–0.2)

## 2021-07-27 LAB — POCT I-STAT EG7
Acid-Base Excess: 1 mmol/L (ref 0.0–2.0)
Acid-Base Excess: 2 mmol/L (ref 0.0–2.0)
Acid-Base Excess: 2 mmol/L (ref 0.0–2.0)
Bicarbonate: 27 mmol/L (ref 20.0–28.0)
Bicarbonate: 28.2 mmol/L — ABNORMAL HIGH (ref 20.0–28.0)
Bicarbonate: 28.2 mmol/L — ABNORMAL HIGH (ref 20.0–28.0)
Calcium, Ion: 1.06 mmol/L — ABNORMAL LOW (ref 1.15–1.40)
Calcium, Ion: 1.17 mmol/L (ref 1.15–1.40)
Calcium, Ion: 1.18 mmol/L (ref 1.15–1.40)
HCT: 35 % — ABNORMAL LOW (ref 36.0–46.0)
HCT: 37 % (ref 36.0–46.0)
HCT: 37 % (ref 36.0–46.0)
Hemoglobin: 11.9 g/dL — ABNORMAL LOW (ref 12.0–15.0)
Hemoglobin: 12.6 g/dL (ref 12.0–15.0)
Hemoglobin: 12.6 g/dL (ref 12.0–15.0)
O2 Saturation: 65 %
O2 Saturation: 66 %
O2 Saturation: 67 %
Potassium: 3.2 mmol/L — ABNORMAL LOW (ref 3.5–5.1)
Potassium: 3.6 mmol/L (ref 3.5–5.1)
Potassium: 3.6 mmol/L (ref 3.5–5.1)
Sodium: 142 mmol/L (ref 135–145)
Sodium: 142 mmol/L (ref 135–145)
Sodium: 145 mmol/L (ref 135–145)
TCO2: 28 mmol/L (ref 22–32)
TCO2: 30 mmol/L (ref 22–32)
TCO2: 30 mmol/L (ref 22–32)
pCO2, Ven: 47 mmHg (ref 44.0–60.0)
pCO2, Ven: 47.3 mmHg (ref 44.0–60.0)
pCO2, Ven: 47.6 mmHg (ref 44.0–60.0)
pH, Ven: 7.367 (ref 7.250–7.430)
pH, Ven: 7.381 (ref 7.250–7.430)
pH, Ven: 7.384 (ref 7.250–7.430)
pO2, Ven: 35 mmHg (ref 32.0–45.0)
pO2, Ven: 35 mmHg (ref 32.0–45.0)
pO2, Ven: 36 mmHg (ref 32.0–45.0)

## 2021-07-27 LAB — GLUCOSE, CAPILLARY
Glucose-Capillary: 124 mg/dL — ABNORMAL HIGH (ref 70–99)
Glucose-Capillary: 109 mg/dL — ABNORMAL HIGH (ref 70–99)

## 2021-07-27 LAB — POCT I-STAT 7, (LYTES, BLD GAS, ICA,H+H)
Acid-Base Excess: 0 mmol/L (ref 0.0–2.0)
Bicarbonate: 25.9 mmol/L (ref 20.0–28.0)
Calcium, Ion: 1.18 mmol/L (ref 1.15–1.40)
HCT: 36 % (ref 36.0–46.0)
Hemoglobin: 12.2 g/dL (ref 12.0–15.0)
O2 Saturation: 90 %
Potassium: 3.4 mmol/L — ABNORMAL LOW (ref 3.5–5.1)
Sodium: 134 mmol/L — ABNORMAL LOW (ref 135–145)
TCO2: 27 mmol/L (ref 22–32)
pCO2 arterial: 48.1 mmHg — ABNORMAL HIGH (ref 32.0–48.0)
pH, Arterial: 7.34 — ABNORMAL LOW (ref 7.350–7.450)
pO2, Arterial: 63 mmHg — ABNORMAL LOW (ref 83.0–108.0)

## 2021-07-27 LAB — BASIC METABOLIC PANEL
Anion gap: 9 (ref 5–15)
BUN: 13 mg/dL (ref 6–20)
CO2: 27 mmol/L (ref 22–32)
Calcium: 9.1 mg/dL (ref 8.9–10.3)
Chloride: 101 mmol/L (ref 98–111)
Creatinine, Ser: 1.28 mg/dL — ABNORMAL HIGH (ref 0.44–1.00)
GFR, Estimated: 49 mL/min — ABNORMAL LOW (ref >60)
Glucose, Bld: 118 mg/dL — ABNORMAL HIGH (ref 70–99)
Potassium: 3.5 mmol/L (ref 3.5–5.1)
Sodium: 137 mmol/L (ref 135–145)

## 2021-07-27 SURGERY — RIGHT/LEFT HEART CATH AND CORONARY ANGIOGRAPHY
Anesthesia: LOCAL

## 2021-07-27 MED ORDER — ACETAMINOPHEN 325 MG PO TABS: 650.0000 mg | ORAL_TABLET | ORAL | Status: DC | PRN

## 2021-07-27 MED ORDER — SODIUM CHLORIDE 0.9 % IV SOLN: 250.0000 mL | INTRAVENOUS | Status: DC | PRN

## 2021-07-27 MED ORDER — HEPARIN (PORCINE) IN NACL 2000-0.9 UNIT/L-% IV SOLN
INTRAVENOUS | Status: AC
  Filled 2021-07-27: qty 1000

## 2021-07-27 MED ORDER — MIDAZOLAM HCL 2 MG/2ML IJ SOLN
INTRAMUSCULAR | Status: DC | PRN
  Administered 2021-07-27 (×2): 1 mg via INTRAVENOUS

## 2021-07-27 MED ORDER — LIDOCAINE HCL (PF) 1 % IJ SOLN
INTRAMUSCULAR | Status: AC
  Filled 2021-07-27: qty 30

## 2021-07-27 MED ORDER — SODIUM CHLORIDE 0.9% FLUSH: 3.0000 mL | Freq: Two times a day (BID) | INTRAVENOUS | Status: DC

## 2021-07-27 MED ORDER — SODIUM CHLORIDE 0.9% FLUSH: 3.0000 mL | INTRAVENOUS | Status: DC | PRN

## 2021-07-27 MED ORDER — HEPARIN (PORCINE) IN NACL 1000-0.9 UT/500ML-% IV SOLN
INTRAVENOUS | Status: DC | PRN
  Administered 2021-07-27: 500 mL

## 2021-07-27 MED ORDER — FENTANYL CITRATE (PF) 100 MCG/2ML IJ SOLN
INTRAMUSCULAR | Status: AC
  Filled 2021-07-27: qty 2

## 2021-07-27 MED ORDER — ONDANSETRON HCL 4 MG/2ML IJ SOLN: 4.0000 mg | Freq: Four times a day (QID) | INTRAMUSCULAR | Status: DC | PRN

## 2021-07-27 MED ORDER — VERAPAMIL HCL 2.5 MG/ML IV SOLN
INTRAVENOUS | Status: DC | PRN
  Administered 2021-07-27: 10 mL via INTRA_ARTERIAL

## 2021-07-27 MED ORDER — HEPARIN (PORCINE) IN NACL 2000-0.9 UNIT/L-% IV SOLN
INTRAVENOUS | Status: DC | PRN
  Administered 2021-07-27: 1000 mL

## 2021-07-27 MED ORDER — ASPIRIN 81 MG PO CHEW: 81.0000 mg | CHEWABLE_TABLET | ORAL | Status: DC

## 2021-07-27 MED ORDER — LIDOCAINE HCL (PF) 1 % IJ SOLN
INTRAMUSCULAR | Status: DC | PRN
  Administered 2021-07-27: 5 mL

## 2021-07-27 MED ORDER — NITROGLYCERIN 1 MG/10 ML FOR IR/CATH LAB
INTRA_ARTERIAL | Status: AC
  Filled 2021-07-27: qty 10

## 2021-07-27 MED ORDER — HEPARIN SODIUM (PORCINE) 1000 UNIT/ML IJ SOLN
INTRAMUSCULAR | Status: AC
  Filled 2021-07-27: qty 10

## 2021-07-27 MED ORDER — MIDAZOLAM HCL 2 MG/2ML IJ SOLN
INTRAMUSCULAR | Status: AC
  Filled 2021-07-27: qty 2

## 2021-07-27 MED ORDER — HEPARIN SODIUM (PORCINE) 1000 UNIT/ML IJ SOLN
INTRAMUSCULAR | Status: DC | PRN
  Administered 2021-07-27: 4000 [IU] via INTRAVENOUS

## 2021-07-27 MED ORDER — VERAPAMIL HCL 2.5 MG/ML IV SOLN
INTRAVENOUS | Status: AC
  Filled 2021-07-27: qty 2

## 2021-07-27 MED ORDER — FENTANYL CITRATE (PF) 100 MCG/2ML IJ SOLN
INTRAMUSCULAR | Status: DC | PRN
  Administered 2021-07-27 (×2): 25 ug via INTRAVENOUS

## 2021-07-27 MED ORDER — SODIUM CHLORIDE 0.9 % WEIGHT BASED INFUSION: 1.0000 mL/kg/h | INTRAVENOUS | Status: DC

## 2021-07-27 MED ORDER — SODIUM CHLORIDE 0.9 % IV SOLN: INTRAVENOUS | Status: DC

## 2021-07-27 SURGICAL SUPPLY — 12 items
CATH BALLN WEDGE 5F 110CM (CATHETERS) ×1 IMPLANT
CATH INFINITI 5 FR JL3.5 (CATHETERS) ×1 IMPLANT
CATH OPTITORQUE TIG 4.0 5F (CATHETERS) ×1 IMPLANT
DEVICE RAD COMP TR BAND LRG (VASCULAR PRODUCTS) ×1 IMPLANT
GLIDESHEATH SLEND A-KIT 6F 22G (SHEATH) ×1 IMPLANT
GUIDEWIRE INQWIRE 1.5J.035X260 (WIRE) IMPLANT
INQWIRE 1.5J .035X260CM (WIRE) ×2
KIT HEART LEFT (KITS) ×2 IMPLANT
PACK CARDIAC CATHETERIZATION (CUSTOM PROCEDURE TRAY) ×2 IMPLANT
SHEATH GLIDE SLENDER 4/5FR (SHEATH) ×1 IMPLANT
TRANSDUCER W/STOPCOCK (MISCELLANEOUS) ×2 IMPLANT
TUBING CIL FLEX 10 FLL-RA (TUBING) ×2 IMPLANT

## 2021-07-27 NOTE — Progress Notes (Addendum)
TR BAND REMOVAL  LOCATION:  right radial  DEFLATED PER PROTOCOL:  Yes.    TIME BAND OFF / DRESSING APPLIED:   1300   SITE UPON ARRIVAL:   Level 0  SITE AFTER BAND REMOVAL:  Level 0  CIRCULATION SENSATION AND MOVEMENT:  Within Normal Limits  Yes.    COMMENTS:    

## 2021-07-27 NOTE — Interval H&P Note (Signed)
History and Physical Interval Note:  07/27/2021 8:17 AM  Andrea Robertson  has presented today for surgery, with the diagnosis of CAD.  The various methods of treatment have been discussed with the patient and family. After consideration of risks, benefits and other options for treatment, the patient has consented to  Procedure(s): RIGHT/LEFT HEART CATH AND CORONARY ANGIOGRAPHY (N/A) as a surgical intervention.  The patient's history has been reviewed, patient examined, no change in status, stable for surgery.  I have reviewed the patient's chart and labs.  Questions were answered to the patient's satisfaction.   Cath Lab Visit (complete for each Cath Lab visit)  Clinical Evaluation Leading to the Procedure:   ACS: No.  Non-ACS:    Anginal Classification: CCS III  Anti-ischemic medical therapy: Maximal Therapy (2 or more classes of medications)  Non-Invasive Test Results: High-risk stress test findings: cardiac mortality >3%/year  Prior CABG: No previous CABG   Yates Decamp

## 2021-07-28 ENCOUNTER — Encounter (HOSPITAL_COMMUNITY): Payer: Self-pay | Admitting: Cardiology

## 2021-08-05 ENCOUNTER — Other Ambulatory Visit: Payer: Self-pay | Admitting: Cardiology

## 2021-08-05 DIAGNOSIS — E1122 Type 2 diabetes mellitus with diabetic chronic kidney disease: Secondary | ICD-10-CM

## 2021-08-06 ENCOUNTER — Other Ambulatory Visit: Payer: Self-pay

## 2021-08-06 ENCOUNTER — Encounter: Payer: Self-pay | Admitting: Cardiology

## 2021-08-06 ENCOUNTER — Ambulatory Visit: Payer: BC Managed Care – PPO | Admitting: Cardiology

## 2021-08-06 VITALS — BP 125/76 | HR 71 | Temp 98.0°F | Resp 16 | Ht 62.0 in | Wt 181.0 lb

## 2021-08-06 DIAGNOSIS — I10 Essential (primary) hypertension: Secondary | ICD-10-CM

## 2021-08-06 DIAGNOSIS — E1122 Type 2 diabetes mellitus with diabetic chronic kidney disease: Secondary | ICD-10-CM

## 2021-08-06 DIAGNOSIS — I25118 Atherosclerotic heart disease of native coronary artery with other forms of angina pectoris: Secondary | ICD-10-CM

## 2021-08-06 DIAGNOSIS — I5042 Chronic combined systolic (congestive) and diastolic (congestive) heart failure: Secondary | ICD-10-CM

## 2021-08-06 DIAGNOSIS — E782 Mixed hyperlipidemia: Secondary | ICD-10-CM

## 2021-08-06 DIAGNOSIS — E119 Type 2 diabetes mellitus without complications: Secondary | ICD-10-CM | POA: Insufficient documentation

## 2021-08-06 DIAGNOSIS — N1831 Chronic kidney disease, stage 3a: Secondary | ICD-10-CM | POA: Insufficient documentation

## 2021-08-06 MED ORDER — SACUBITRIL-VALSARTAN 24-26 MG PO TABS: 1.0000 | ORAL_TABLET | Freq: Two times a day (BID) | ORAL | Status: DC

## 2021-08-06 NOTE — Progress Notes (Signed)
Primary Physician/Referring:  Leeroy Cha, MD  Patient ID: Andrea Robertson, female    DOB: [birth year], 60 y.o.   MRN: [id number]  Chief Complaint  Patient presents with   Post Cath    Coronary Artery Disease   Follow-up   HPI:    Andrea Robertson  is a 60 y.o. African-American female patient with history of NSTEMI and CAD, LAD stenting in 2020, diet-controlled diabetes mellitus, hypertension, hyperlipidemia, chronic stage III kidney disease probably due to hypertension and diabetes mellitus.   She has had a high risk nuclear stress test on 07/12/2019 which had revealed extensive scarring of the inferior wall and inferoseptal and anteroseptal wall with reduced EF at 44% but was recommended to continue medical therapy.  Patient underwent cardiac catheterization in view of high risk nuclear stress test on 07/27/2021. No change in dyspnea. No complications from the procedure.    Past Medical History:  Diagnosis Date   Acute non-ST segment elevation myocardial infarction ([place]) 11/10/2020   AKI (acute kidney injury) ([place])    stage? 3 not followed by a kidney MD    Anxiety    Arthritis    Coronary artery disease involving native coronary artery of native heart without angina pectoris 07/04/2019   Depression    Essential hypertension 10/30/2018   GERD (gastroesophageal reflux disease)    Hyperlipidemia 07/04/2019   Myocardial infarction ([place])    Pericarditis 08/27/2019   Primary localized osteoarthritis of left knee    PVC (premature ventricular contraction) 10/30/2018   S/P total knee arthroplasty, right    Shortness of breath    pt denies    Vertigo 10/30/2018   Past Surgical History:  Procedure Laterality Date   ABDOMINAL HYSTERECTOMY     CHOLECYSTECTOMY     CORONARY STENT INTERVENTION N/A 07/03/2019   Procedure: CORONARY STENT INTERVENTION;  Surgeon: Sherren Mocha, MD;  Location: [facility];  Service: Cardiovascular;  Laterality: N/A;   LEFT HEART CATH AND  CORONARY ANGIOGRAPHY N/A 07/03/2019   Procedure: LEFT HEART CATH AND CORONARY ANGIOGRAPHY;  Surgeon: Sherren Mocha, MD;  Location: [facility];  Service: Cardiovascular;  Laterality: N/A;   RIGHT/LEFT HEART CATH AND CORONARY ANGIOGRAPHY N/A 07/27/2021   Procedure: RIGHT/LEFT HEART CATH AND CORONARY ANGIOGRAPHY;  Surgeon: Adrian Prows, MD;  Location: [facility];  Service: Cardiovascular;  Laterality: N/A;   TOTAL KNEE ARTHROPLASTY Right 10/13/2015   Procedure: RIGHT TOTAL KNEE ARTHROPLASTY;  Surgeon: Latanya Maudlin, MD;  Location: [facility];  Service: Orthopedics;  Laterality: Right;   TOTAL KNEE ARTHROPLASTY Left 11/23/2020   Procedure: TOTAL KNEE ARTHROPLASTY;  Surgeon: Elsie Saas, MD;  Location: [facility];  Service: Orthopedics;  Laterality: Left;   Family History  Problem Relation Age of Onset   Heart disease Mother    Multiple myeloma Mother    Heart attack Father 30   CAD Father    Hypertension Sister    Hypertension Sister    Hypertension Sister    Heart failure Brother     Social History   Tobacco Use   Smoking status: Never   Smokeless tobacco: Never  Substance Use Topics   Alcohol use: Yes    Comment: rare   Marital Status: Married  ROS  Review of Systems  Constitutional: Negative for malaise/fatigue and weight gain.  Cardiovascular:  Positive for dyspnea on exertion (chronic, stable). Negative for chest pain, claudication, leg swelling, near-syncope, orthopnea, palpitations, paroxysmal nocturnal dyspnea and syncope.  Respiratory:  Negative for shortness  of breath.   Musculoskeletal:  Positive for arthritis and back pain.       Left arm pain and numbness  Gastrointestinal:  Negative for melena.  Neurological:  Negative for dizziness.  Objective  Blood pressure 125/76, pulse 71, temperature 98 F (36.7 C), temperature source Temporal, resp. rate 16, height '5\' 2"'  (1.575 m), weight 181 lb (82.1 kg), SpO2 97 %. Body mass index is 33.11 kg/m.  Vitals with BMI  08/06/2021 07/27/2021 07/27/2021  Height '5\' 2"'  - -  Weight 181 lbs - -  BMI 55.9 - -  Systolic 741 93 638  Diastolic 76 76 58  Pulse 71 73 69     Physical Exam Vitals reviewed.  Constitutional:      Appearance: She is obese.  Neck:     Vascular: No carotid bruit or JVD.  Cardiovascular:     Rate and Rhythm: Normal rate and regular rhythm.     Pulses: Intact distal pulses.     Heart sounds: Normal heart sounds. No murmur heard.   No gallop.  Pulmonary:     Effort: Pulmonary effort is normal.     Breath sounds: Normal breath sounds.  Abdominal:     General: Bowel sounds are normal.     Palpations: Abdomen is soft.  Musculoskeletal:     Right lower leg: No edema.     Left lower leg: No edema.     Laboratory examination:   Recent Labs    11/11/20 1147 11/24/20 0311 03/18/21 1520 04/19/21 0950 06/09/21 1051 07/27/21 0724 07/27/21 1004 07/27/21 1008 07/27/21 1025  NA 140 138   < > 141 137 137 142   142 145 134*  K 3.8 3.8   < > 3.9 3.9 3.5 3.6   3.6 3.2* 3.4*  CL 103 105   < > 102 100 101  --   --   --   CO2 29 24   < > '25 24 27  ' --   --   --   GLUCOSE 109* 211*   < > 131* 110* 118*  --   --   --   BUN 19 18   < > '14 14 13  ' --   --   --   CREATININE 1.28* 1.24*   < > 1.38* 1.33* 1.28*  --   --   --   CALCIUM 9.1 8.5*   < > 9.5 9.3 9.1  --   --   --   GFRNONAA 49* 51*  --   --   --  49*  --   --   --    < > = values in this interval not displayed.   estimated creatinine clearance is 47.6 mL/min (A) (by C-G formula based on SCr of 1.28 mg/dL (H)).  CMP Latest Ref Rng & Units 07/27/2021 07/27/2021 07/27/2021  Glucose 70 - 99 mg/dL - - -  BUN 6 - 20 mg/dL - - -  Creatinine 0.44 - 1.00 mg/dL - - -  Sodium 135 - 145 mmol/L 134(L) 145 142  Potassium 3.5 - 5.1 mmol/L 3.4(L) 3.2(L) 3.6  Chloride 98 - 111 mmol/L - - -  CO2 22 - 32 mmol/L - - -  Calcium 8.9 - 10.3 mg/dL - - -  Total Protein 6.5 - 8.1 g/dL - - -  Total Bilirubin 0.3 - 1.2 mg/dL - - -  Alkaline Phos 38 - 126  U/L - - -  AST 15 - 41 U/L - - -  ALT 0 - 44 U/L - - -   CBC Latest Ref Rng & Units 07/27/2021 07/27/2021 07/27/2021  WBC 4.0 - 10.5 K/uL - - -  Hemoglobin 12.0 - 15.0 g/dL 12.2 11.9(L) 12.6  Hematocrit 36.0 - 46.0 % 36.0 35.0(L) 37.0  Platelets 150 - 400 K/uL - - -    Lipid Panel Recent Labs    06/09/21 1051  CHOL 160  TRIG 91  LDLCALC 83  HDL 60   Lipid Panel     Component Value Date/Time   CHOL 160 06/09/2021 1051   TRIG 91 06/09/2021 1051   HDL 60 06/09/2021 1051   CHOLHDL 4.2 07/04/2019 0912   VLDL 37 07/04/2019 0912   LDLCALC 83 06/09/2021 1051   LABVLDL 17 06/09/2021 1051     HEMOGLOBIN A1C Lab Results  Component Value Date   HGBA1C 6.8 (H) 11/11/2020   MPG 148.46 11/11/2020   TSH No results for input(s): TSH in the last 8760 hours.  External labs:   Cholesterol, total 155.000 m 12/15/2020 HDL 57.000 mg 12/15/2020 LDL 82.000 mg 12/15/2020 Triglycerides 88.000 mg 12/15/2020  A1C 6.600 mg/ 12/15/2020  Creatinine, Serum 1.380 mg/ 04/19/2021 Potassium 3.900 mm 04/19/2021  Allergies   Allergies  Allergen Reactions   Dilaudid [Hydromorphone Hcl] Hives    Can tolerate if premedicated with benadryl   Erythromycin Nausea And Vomiting   Penicillins Hives and Nausea And Vomiting    Has patient had a PCN reaction causing immediate rash, facial/tongue/throat swelling, SOB or lightheadedness with hypotension: No Has patient had a PCN reaction causing severe rash involving mucus membranes or skin necrosis: No Has patient had a PCN reaction that required hospitalization No Has patient had a PCN reaction occurring within the last 10 years: Yes If all of the above answers are "NO", then may proceed with Cephalosporin use.    Percocet [Oxycodone-Acetaminophen] Itching and Nausea And Vomiting    Can tolerate with premedication of diphenhydramine   Zithromax [Azithromycin] Nausea And Vomiting   Codeine Nausea Only   Oxycodone     Other reaction(s): nausea, epigastric  pain   Cefprozil Rash    rash but takes Ceftin   Other Rash    Gel on Ekg pads      Medication prior to this encounter:   Outpatient Medications Prior to Visit  Medication Sig Dispense Refill   Accu-Chek FastClix Lancets MISC USE TO TEST YOUR BLOOD SUGAR TWICE A DAY DX E11.21     amLODipine (NORVASC) 10 MG tablet Take 1 tablet (10 mg total) by mouth daily. 90 tablet 3   aspirin EC 81 MG EC tablet Take 1 tablet (81 mg total) by mouth daily. 90 tablet 3   atorvastatin (LIPITOR) 80 MG tablet Take 1 tablet (80 mg total) by mouth daily at 6 PM. 90 tablet 3   buPROPion (WELLBUTRIN XL) 300 MG 24 hr tablet Take 300 mg by mouth daily.     carvedilol (COREG) 25 MG tablet Take 1 tablet (25 mg total) by mouth 2 (two) times daily. 180 tablet 3   dapagliflozin propanediol (FARXIGA) 10 MG TABS tablet Take 1 tablet (10 mg total) by mouth daily before breakfast. 90 tablet 3   docusate sodium (COLACE) 100 MG capsule Take 100 mg by mouth daily.     escitalopram (LEXAPRO) 10 MG tablet Take 10 mg by mouth daily.     ezetimibe (ZETIA) 10 MG tablet Take 1 tablet (10 mg total) by mouth daily. 90 tablet 3   fexofenadine (ALLEGRA)  180 MG tablet Take 180 mg by mouth daily as needed for allergies.     fluticasone (FLONASE) 50 MCG/ACT nasal spray Place 1 spray into both nostrils daily as needed for allergies.     meclizine (ANTIVERT) 25 MG tablet Take 1 tablet (25 mg total) by mouth 3 (three) times daily as needed for dizziness. 30 tablet 0   Multiple Vitamin (MULTIVITAMIN WITH MINERALS) TABS tablet Take 1 tablet by mouth daily.     nitroGLYCERIN (NITROSTAT) 0.4 MG SL tablet Place 1 tablet (0.4 mg total) under the tongue every 5 (five) minutes x 3 doses as needed for chest pain. 25 tablet 1   losartan (COZAAR) 25 MG tablet TAKE 1 TABLET BY MOUTH EVERY EVENING 30 tablet 2   No facility-administered medications prior to visit.    Medication list after today's encounter   Current Outpatient Medications  Medication  Instructions   Accu-Chek FastClix Lancets MISC USE TO TEST YOUR BLOOD SUGAR TWICE A DAY DX E11.21   amLODipine (NORVASC) 10 mg, Oral, Daily   aspirin 81 mg, Oral, Daily   atorvastatin (LIPITOR) 80 mg, Oral, Daily-1800   buPROPion (WELLBUTRIN XL) 300 mg, Oral, Daily   carvedilol (COREG) 25 mg, Oral, 2 times daily   dapagliflozin propanediol (FARXIGA) 10 mg, Oral, Daily before breakfast   docusate sodium (COLACE) 100 mg, Oral, Daily   escitalopram (LEXAPRO) 10 mg, Oral, Daily   ezetimibe (ZETIA) 10 mg, Oral, Daily   fexofenadine (ALLEGRA) 180 mg, Oral, Daily PRN   fluticasone (FLONASE) 50 MCG/ACT nasal spray 1 spray, Each Nare, Daily PRN   meclizine (ANTIVERT) 25 mg, Oral, 3 times daily PRN   Multiple Vitamin (MULTIVITAMIN WITH MINERALS) TABS tablet 1 tablet, Oral, Daily   nitroGLYCERIN (NITROSTAT) 0.4 mg, Sublingual, Every 5 min x3 PRN   Radiology:   No results found.  Cardiac Studies:   Cardiac catheterization 07/03/2019 1. Total occlusion of the mid-LAD, treated successfully with PCI using a 2.5x20 mm Synergy DES 2. Mild nonobstructive stenosis of the LCx and RCA 3. Mildly elevated LVEDP  Event monitor 08/27/2019 14 days Normal sinus rhythm, sinus tachycardia; no atrial fibrillation    PCV ECHOCARDIOGRAM COMPLETE 06/01/2021  Narrative Echocardiogram 06/01/2021: Left ventricle cavity is normal in size. Normal global wall motion. Akinesis of mid to distal anteroseptal, anteroapical, apical, infroaepical regions. LVEF 25-30%. Although no evidence on 2D images, cannot exclude LV apical thrombus, in absence of contrast use. Doppler evidence of grade I (impaired) diastolic dysfunction, normal LAP. Structurally normal trileaflet aortic valve. Mild (Grade I) aortic regurgitation. Mild (Grade I) mitral regurgitation. Mild pulmonic regurgitation. Normal right atrial pressure. Wall motion abnormality more prominent, compared to 06/2019 study. LVEF has reduced from 50-55%.    PCV  MYOCARDIAL PERFUSION WO LEXISCAN 06/21/2021 Lexiscan/modified Bruce nuclear stress test performed using 1-day protocol. Patient reached 7 METS, 64% MPHR. In addition, stress EKG showed sinus tachycardia, inferolateral infarct, no acute ischemic changes. SPECT Images showed medium sized, severe intensity, predominantly fixed perfusion defect with minimal peri-infarct ischemia in apical to basal, inferior/inferoseptal myocardium, associated with absent myocardial thickening. Stress LVEF 35%. Compared to the study done on 06/2020, no significant change.  Previously noted inferior wall scar is unchanged. Anterior and anterolateral scar with minimal reversible defect not noted in the present study. EF was previously calculated at 44%.  High risk study.   Left and right heart Catheterization 07/27/21:  RA: 11/10, mean 8, RA saturation 65% RV: 28/-3, EDP 4 mmHg PA 28/3, mean 16 mmHg.  PA saturation  67%. PW 8/8, mean 5 mmHg.  Aortic saturation 90% QP/QS 1.01, left >right shunt 0.48, 8%, not significant. CO 5.5, CI 2.99, normal. LV: 107/0, EDP 12 mmHg.  Ao 108/60, mean 82 mmHg.  No pressure gradient across the aortic valve.  LV: Distal anterior, apical and inferoapical aneurysm formation.  LVEF 40%.  No significant MR. RCA: Minimal disease, dominant. LM: Large vessel.  Smooth and normal. LAD: Large vessel.  It gives origin to a moderate-sized D1 and D2, D1 has ostial 50 to 60% stenosis.  Mid LAD stent 2.5 x 20 mm Synergy placed 07/03/2019 widely patent. LCx: Large vessel, smooth and normal.  Impression: Patient has ischemic cardiomyopathy with apical dyskinesis.  Preserved cardiac output and cardiac index.  There is insignificant right left shunting.  90 mL contrast utilized.  Patient needs continued aggressive risk modification and medical therapy.   EKG:   06/03/2021: Sinus rhythm rate of 74 bpm.  Left axis.  Poor R wave progression, cannot exclude anteroseptal infarct old.  Inferior infarct old.   LVH.  Nonspecific T wave abnormality.  Unchanged compared to previous EKG 05/11/2021.  05/19/2021: Normal sinus rhythm at rate of 76 bpm, inferior infarct old.  Anterolateral infarct old.  LVH.  Nonspecific T abnormality diffuse.    Assessment     ICD-10-CM   1. Coronary artery disease of native artery of native heart with stable angina pectoris (HCC)  I25.118 High sensitivity CRP    2. Type 2 diabetes mellitus with stage 3a chronic kidney disease, without long-term current use of insulin (HCC)  E11.22    N18.31     3. Essential hypertension  I10     4. Mixed hyperlipidemia  E78.2 Lipid Panel With LDL/HDL Ratio    5. Chronic combined systolic and diastolic HF (heart failure) (HCC)  I50.42 sacubitril-valsartan (ENTRESTO) 24-26 mg per tablet    Basic metabolic panel       Medications Discontinued During This Encounter  Medication Reason   losartan (COZAAR) 25 MG tablet Change in therapy     Meds ordered this encounter  Medications   sacubitril-valsartan (ENTRESTO) 24-26 mg per tablet    Order Specific Question:   ACE-inhibitors have NOT been administered in the past 36-hours.    Answer:   YES (confirmed by ordering provider)     Orders Placed This Encounter  Procedures   Basic metabolic panel   Lipid Panel With LDL/HDL Ratio   High sensitivity CRP   Recommendations:   Nasiya Pascual is a 60 y.o. African-American female patient with history of NSTEMI and CAD, LAD stenting in 2020, diet-controlled diabetes mellitus, hypertension, hyperlipidemia, chronic stage III kidney disease probably due to hypertension and diabetes mellitus.   She has had a high risk nuclear stress test on 07/12/2019 which had revealed extensive scarring of the inferior wall and inferoseptal and anteroseptal wall with reduced EF at 44% but was recommended to continue medical therapy.  Patient underwent cardiac catheterization in view of high risk nuclear stress test on 07/27/2021, suspect she has  anterior wall scar leading to a EF of 40%.  The previously placed and was patent.  In view of this, I will discontinue losartan and switch her to [facility], samples given.  She will obtain BMP in 10 days so I can uptitrate the medication.  She does have stage IIIa chronic kidney disease and we need to follow on serum creatinine.  She is tolerating Zetia, I will recheck lipids in 10 days along with a BMP.  We will also obtain CRP.  Extensive discussion regarding dyspnea on exertion, chronic systolic and diastolic heart failure, weight loss.  She has no joint exercise program on [facility].  Would like to see her back in 3 months for follow-up.    Adrian Prows, PA-C 08/06/2021, 3:36 PM Office: [phone]

## 2021-08-20 LAB — LIPID PANEL WITH LDL/HDL RATIO
Cholesterol, Total: 135 mg/dL (ref 100–199)
HDL: 63 mg/dL (ref >39)
LDL Chol Calc (NIH): 57 mg/dL (ref 0–99)
LDL/HDL Ratio: 0.9 ratio (ref 0.0–3.2)
Triglycerides: 78 mg/dL (ref 0–149)
VLDL Cholesterol Cal: 15 mg/dL (ref 5–40)

## 2021-08-20 LAB — BASIC METABOLIC PANEL
BUN/Creatinine Ratio: 10 (ref 9–23)
BUN: 14 mg/dL (ref 6–24)
CO2: 25 mmol/L (ref 20–29)
Calcium: 9 mg/dL (ref 8.7–10.2)
Chloride: 103 mmol/L (ref 96–106)
Creatinine, Ser: 1.42 mg/dL — ABNORMAL HIGH (ref 0.57–1.00)
Glucose: 151 mg/dL — ABNORMAL HIGH (ref 70–99)
Potassium: 3.7 mmol/L (ref 3.5–5.2)
Sodium: 142 mmol/L (ref 134–144)
eGFR: 43 mL/min/{1.73_m2} — ABNORMAL LOW (ref >59)

## 2021-08-20 LAB — HIGH SENSITIVITY CRP: CRP, High Sensitivity: 5.86 mg/L — ABNORMAL HIGH (ref 0.00–3.00)

## 2021-08-25 ENCOUNTER — Other Ambulatory Visit: Payer: Self-pay

## 2021-08-25 DIAGNOSIS — I25118 Atherosclerotic heart disease of native coronary artery with other forms of angina pectoris: Secondary | ICD-10-CM

## 2021-09-04 LAB — HM PAP SMEAR

## 2021-09-04 LAB — HM MAMMOGRAPHY

## 2021-09-18 LAB — BMP8+EGFR
BUN/Creatinine Ratio: 11 (ref 9–23)
BUN: 15 mg/dL (ref 6–24)
CO2: 26 mmol/L (ref 20–29)
Calcium: 9.2 mg/dL (ref 8.7–10.2)
Chloride: 103 mmol/L (ref 96–106)
Creatinine, Ser: 1.38 mg/dL — ABNORMAL HIGH (ref 0.57–1.00)
Glucose: 115 mg/dL — ABNORMAL HIGH (ref 70–99)
Potassium: 3.8 mmol/L (ref 3.5–5.2)
Sodium: 142 mmol/L (ref 134–144)
eGFR: 44 mL/min/{1.73_m2} — ABNORMAL LOW (ref >59)

## 2021-09-18 NOTE — Progress Notes (Signed)
Stable renal function.

## 2021-09-24 ENCOUNTER — Encounter: Payer: Self-pay | Admitting: Cardiology

## 2021-09-24 ENCOUNTER — Telehealth: Payer: Self-pay

## 2021-09-24 DIAGNOSIS — I5042 Chronic combined systolic (congestive) and diastolic (congestive) heart failure: Secondary | ICD-10-CM

## 2021-09-24 MED ORDER — ENTRESTO 49-51 MG PO TABS: 1.0000 | ORAL_TABLET | Freq: Two times a day (BID) | ORAL | 2 refills | Status: DC

## 2021-09-24 NOTE — Telephone Encounter (Signed)
ICD-10-CM   1. Chronic combined systolic and diastolic HF (heart failure) (HCC)  I50.42 sacubitril-valsartan (ENTRESTO) 49-51 MG    Basic metabolic panel     Medications Discontinued During This Encounter  Medication Reason   sacubitril-valsartan (ENTRESTO) 24-26 mg per tablet    Meds ordered this encounter  Medications   sacubitril-valsartan (ENTRESTO) 49-51 MG    Sig: Take 1 tablet by mouth 2 (two) times daily.    Dispense:  60 tablet    Refill:  2

## 2021-09-24 NOTE — Telephone Encounter (Signed)
Pt called and stated that she wants a refill on her entresto. It says it was put in as a clinic administered medication. When I looked at your notes on the office visit it states you wanted to see how her labs looked before uping the dose. On the labs you said you will decide at the next office visit. Pt is not scheduled until 11/03/2021. Do you want [place] to giver her samples or 30 send in refill for 30 day supply. Please advise.

## 2021-09-27 ENCOUNTER — Other Ambulatory Visit: Payer: Self-pay

## 2021-09-27 DIAGNOSIS — I5042 Chronic combined systolic (congestive) and diastolic (congestive) heart failure: Secondary | ICD-10-CM

## 2021-09-27 MED ORDER — ENTRESTO 49-51 MG PO TABS: 1.0000 | ORAL_TABLET | Freq: Two times a day (BID) | ORAL | 2 refills | Status: DC

## 2021-09-27 NOTE — Telephone Encounter (Signed)
Called and spoke to pt, pt voiced understanding.

## 2021-10-01 ENCOUNTER — Encounter: Payer: Self-pay | Admitting: Cardiology

## 2021-11-02 LAB — BASIC METABOLIC PANEL
BUN/Creatinine Ratio: 15 (ref 9–23)
BUN: 17 mg/dL (ref 6–24)
CO2: 26 mmol/L (ref 20–29)
Calcium: 9.7 mg/dL (ref 8.7–10.2)
Chloride: 98 mmol/L (ref 96–106)
Creatinine, Ser: 1.16 mg/dL — ABNORMAL HIGH (ref 0.57–1.00)
Glucose: 112 mg/dL — ABNORMAL HIGH (ref 70–99)
Potassium: 3.8 mmol/L (ref 3.5–5.2)
Sodium: 144 mmol/L (ref 134–144)
eGFR: 55 mL/min/{1.73_m2} — ABNORMAL LOW (ref >59)

## 2021-11-04 ENCOUNTER — Other Ambulatory Visit: Payer: Self-pay

## 2021-11-04 ENCOUNTER — Ambulatory Visit: Payer: BC Managed Care – PPO | Admitting: Cardiology

## 2021-11-04 ENCOUNTER — Encounter: Payer: Self-pay | Admitting: Cardiology

## 2021-11-04 VITALS — BP 111/74 | HR 79 | Temp 98.4°F | Resp 16 | Ht 62.0 in | Wt 187.6 lb

## 2021-11-04 DIAGNOSIS — I5042 Chronic combined systolic (congestive) and diastolic (congestive) heart failure: Secondary | ICD-10-CM

## 2021-11-04 DIAGNOSIS — I10 Essential (primary) hypertension: Secondary | ICD-10-CM

## 2021-11-04 DIAGNOSIS — I428 Other cardiomyopathies: Secondary | ICD-10-CM

## 2021-11-04 DIAGNOSIS — E782 Mixed hyperlipidemia: Secondary | ICD-10-CM

## 2021-11-04 DIAGNOSIS — I25118 Atherosclerotic heart disease of native coronary artery with other forms of angina pectoris: Secondary | ICD-10-CM

## 2021-11-04 MED ORDER — CARVEDILOL 25 MG PO TABS: 37.5000 mg | ORAL_TABLET | Freq: Two times a day (BID) | ORAL | 3 refills | Status: DC

## 2021-11-04 MED ORDER — AMLODIPINE BESYLATE 10 MG PO TABS: 5.0000 mg | ORAL_TABLET | Freq: Every day | ORAL | 3 refills | Status: DC

## 2021-11-04 NOTE — Progress Notes (Signed)
? ?Primary Physician/Referring:  Leeroy Cha, MD ? ?Patient ID: Andrea Robertson, female    DOB: [birth year], 61 y.o.   MRN: [id number] ? ?Chief Complaint  ?Patient presents with  ? Cardiomyopathy  ? Hypertension  ? Follow-up  ?  3 months  ? ?HPI:   ? ?Andrea Robertson  is a 61 y.o. African-American female patient with history of NSTEMI and CAD, LAD stenting in 2020, diet-controlled diabetes mellitus, hypertension, hyperlipidemia, chronic stage III kidney disease probably due to hypertension and diabetes mellitus, due to new onset of cardiomyopathy with reduced LVEF by echocardiogram to 25 to 30% and abnormal nuclear stress test, underwent cardiac catheterization on 07/27/2021 revealing preserved cardiac output and index and no pulm hypertension, widely patent LAD stent with minimal disease in the other vessels and is started on therapy for nonischemic cardiomyopathy. ? ?She is presently doing well, since last office visit 6 weeks ago, states that she has had 2 episodes of mild dyspnea and leg edema and on further questioning does admit to eating poorly 2 days prior.  Otherwise presently doing well. ? ?Past Medical History:  ?Diagnosis Date  ? Anxiety   ? Arthritis   ? Chronic kidney disease   ? Coronary artery disease involving native coronary artery of native heart without angina pectoris 07/04/2019  ? Depression   ? Essential hypertension 10/30/2018  ? GERD (gastroesophageal reflux disease)   ? Hyperlipidemia 07/04/2019  ? Myocardial infarction [facility])   ? Primary localized osteoarthritis of left knee   ? S/P total knee arthroplasty, right   ? ?Family History  ?Problem Relation Age of Onset  ? Heart disease Mother   ? Multiple myeloma Mother   ? Heart attack Father 27  ? CAD Father   ? Hypertension Sister   ? Hypertension Sister   ? Hypertension Sister   ? Heart failure Brother   ?  ?Social History  ? ?Tobacco Use  ? Smoking status: Never  ? Smokeless tobacco: Never  ?Substance Use Topics  ? Alcohol  use: Yes  ?  Comment: rare  ? ?Marital Status: Married  ?ROS  ?Review of Systems  ?Cardiovascular:  Positive for dyspnea on exertion and leg swelling (occasional). Negative for chest pain.  ?Objective  ?Blood pressure 111/74, pulse 79, temperature 98.4 ?F (36.9 ?C), temperature source Temporal, resp. rate 16, height '5\' 2"'  (1.575 m), weight 187 lb 9.6 oz (85.1 kg), SpO2 98 %. Body mass index is 34.31 kg/m?.  ?Vitals with BMI 11/04/2021 08/06/2021 07/27/2021  ?Height '5\' 2"'  '5\' 2"'  -  ?Weight 187 lbs 10 oz 181 lbs -  ?BMI 34.3 33.1 -  ?Systolic 818 563 93  ?Diastolic 74 76 76  ?Pulse 79 71 73  ?  ? Physical Exam ?Constitutional:   ?   Appearance: She is obese.  ?Neck:  ?   Vascular: No carotid bruit or JVD.  ?Cardiovascular:  ?   Rate and Rhythm: Normal rate and regular rhythm.  ?   Pulses: Intact distal pulses.  ?   Heart sounds: Normal heart sounds. No murmur heard. ?  No gallop.  ?Pulmonary:  ?   Effort: Pulmonary effort is normal.  ?   Breath sounds: Normal breath sounds.  ?Abdominal:  ?   General: Bowel sounds are normal.  ?   Palpations: Abdomen is soft.  ?Musculoskeletal:  ?   Right lower leg: No edema.  ?   Left lower leg: No edema.  ?  ?Laboratory examination:  ? ?Recent Labs  ?  11/11/20 ?1147 11/24/20 ?0311 03/18/21 ?1520 07/27/21 ?0724 07/27/21 ?1004 08/19/21 ?1010 09/17/21 ?1328 11/01/21 ?1533  ?NA 140 138   < > 137   < > 142 142 144  ?K 3.8 3.8   < > 3.5   < > 3.7 3.8 3.8  ?CL 103 105   < > 101  --  103 103 98  ?CO2 29 24   < > 27  --  '25 26 26  ' ?GLUCOSE 109* 211*   < > 118*  --  151* 115* 112*  ?BUN 19 18   < > 13  --  '14 15 17  ' ?CREATININE 1.28* 1.24*   < > 1.28*  --  1.42* 1.38* 1.16*  ?CALCIUM 9.1 8.5*   < > 9.1  --  9.0 9.2 9.7  ?GFRNONAA 49* 51*  --  49*  --   --   --   --   ? < > = values in this interval not displayed.  ? ?estimated creatinine clearance is 53.5 mL/min (A) (by C-G formula based on SCr of 1.16 mg/dL (H)).  ?CMP Latest Ref Rng & Units 11/01/2021 09/17/2021 08/19/2021  ?Glucose 70 - 99  mg/dL 112(H) 115(H) 151(H)  ?BUN 6 - 24 mg/dL '17 15 14  ' ?Creatinine 0.57 - 1.00 mg/dL 1.16(H) 1.38(H) 1.42(H)  ?Sodium 134 - 144 mmol/L 144 142 142  ?Potassium 3.5 - 5.2 mmol/L 3.8 3.8 3.7  ?Chloride 96 - 106 mmol/L 98 103 103  ?CO2 20 - 29 mmol/L '26 26 25  ' ?Calcium 8.7 - 10.2 mg/dL 9.7 9.2 9.0  ?Total Protein 6.5 - 8.1 g/dL - - -  ?Total Bilirubin 0.3 - 1.2 mg/dL - - -  ?Alkaline Phos 38 - 126 U/L - - -  ?AST 15 - 41 U/L - - -  ?ALT 0 - 44 U/L - - -  ? ?CBC Latest Ref Rng & Units 07/27/2021 07/27/2021 07/27/2021  ?WBC 4.0 - 10.5 K/uL - - -  ?Hemoglobin 12.0 - 15.0 g/dL 12.2 11.9(L) 12.6  ?Hematocrit 36.0 - 46.0 % 36.0 35.0(L) 37.0  ?Platelets 150 - 400 K/uL - - -  ? ? ?Lipid Panel ?Recent Labs  ?  06/09/21 ?1051 08/19/21 ?1010  ?CHOL 160 135  ?TRIG 91 78  ?LDLCALC 83 57  ?HDL 60 63  ? ?HEMOGLOBIN A1C ?Lab Results  ?Component Value Date  ? HGBA1C 6.8 (H) 11/11/2020  ? MPG 148.46 11/11/2020  ? ?TSH ?No results for input(s): TSH in the last 8760 hours. ? ?External labs:  ? ?Cholesterol, total 155.000 m 12/15/2020 ?HDL 57.000 mg 12/15/2020 ?LDL 82.000 mg 12/15/2020 ?Triglycerides 88.000 mg 12/15/2020 ? ?A1C 6.600 mg/ 12/15/2020 ? ?Creatinine, Serum 1.380 mg/ 04/19/2021 ?Potassium 3.900 mm 04/19/2021 ? ?Allergies  ? ?Allergies  ?Allergen Reactions  ? Dilaudid [Hydromorphone Hcl] Hives  ?  Can tolerate if premedicated with benadryl  ? Erythromycin Nausea And Vomiting  ? Percocet [Oxycodone-Acetaminophen] Itching and Nausea And Vomiting  ?  Can tolerate with premedication of diphenhydramine  ? Zithromax [Azithromycin] Nausea And Vomiting  ? Penicillins Hives and Nausea And Vomiting  ? Codeine Nausea Only  ? Oxycodone   ?  Other reaction(s): nausea, epigastric pain  ? Cefprozil Rash  ?  rash but takes Ceftin  ? Other Rash  ?  Gel on Ekg pads   ?  ?  ?Medication list after today's encounter  ? ? ?Current Outpatient Medications:  ?  Accu-Chek FastClix Lancets MISC, USE TO TEST YOUR BLOOD SUGAR TWICE A  DAY DX E11.21, Disp: , Rfl:  ?   aspirin EC 81 MG EC tablet, Take 1 tablet (81 mg total) by mouth daily., Disp: 90 tablet, Rfl: 3 ?  atorvastatin (LIPITOR) 80 MG tablet, Take 1 tablet (80 mg total) by mouth daily at 6 PM., Disp: 90 tablet, Rfl: 3 ?  buPROPion (WELLBUTRIN XL) 300 MG 24 hr tablet, Take 300 mg by mouth daily., Disp: , Rfl:  ?  dapagliflozin propanediol (FARXIGA) 10 MG TABS tablet, Take 1 tablet (10 mg total) by mouth daily before breakfast., Disp: 90 tablet, Rfl: 3 ?  docusate sodium (COLACE) 100 MG capsule, Take 100 mg by mouth daily., Disp: , Rfl:  ?  ezetimibe (ZETIA) 10 MG tablet, Take 1 tablet (10 mg total) by mouth daily., Disp: 90 tablet, Rfl: 3 ?  fexofenadine (ALLEGRA) 180 MG tablet, Take 180 mg by mouth daily as needed for allergies., Disp: , Rfl:  ?  fluticasone (FLONASE) 50 MCG/ACT nasal spray, Place 1 spray into both nostrils daily as needed for allergies., Disp: , Rfl:  ?  meclizine (ANTIVERT) 25 MG tablet, Take 1 tablet (25 mg total) by mouth 3 (three) times daily as needed for dizziness., Disp: 30 tablet, Rfl: 0 ?  Multiple Vitamin (MULTIVITAMIN WITH MINERALS) TABS tablet, Take 1 tablet by mouth daily., Disp: , Rfl:  ?  nitroGLYCERIN (NITROSTAT) 0.4 MG SL tablet, Place 1 tablet (0.4 mg total) under the tongue every 5 (five) minutes x 3 doses as needed for chest pain., Disp: 25 tablet, Rfl: 1 ?  sacubitril-valsartan (ENTRESTO) 49-51 MG, Take 1 tablet by mouth 2 (two) times daily., Disp: 60 tablet, Rfl: 2 ?  amLODipine (NORVASC) 10 MG tablet, Take 0.5 tablets (5 mg total) by mouth daily., Disp: 90 tablet, Rfl: 3 ?  carvedilol (COREG) 25 MG tablet, Take 1.5 tablets (37.5 mg total) by mouth 2 (two) times daily., Disp: 270 tablet, Rfl: 3  ?Radiology:  ? ?No results found. ? ?Cardiac Studies:  ? ?PCV ECHOCARDIOGRAM COMPLETE 06/01/2021  ?Left ventricle cavity is normal in size. Normal global wall motion. Akinesis of mid to distal anteroseptal, anteroapical, apical, infroaepical regions. LVEF 25-30%. Although no evidence on 2D  images, cannot exclude LV apical thrombus, in absence of contrast use. Doppler evidence of grade I (impaired) diastolic dysfunction, normal LAP. ?Structurally normal trileaflet aortic valve. Mild (Grade I)

## 2021-12-06 ENCOUNTER — Other Ambulatory Visit: Payer: Self-pay

## 2021-12-06 ENCOUNTER — Other Ambulatory Visit: Payer: BC Managed Care – PPO

## 2021-12-06 ENCOUNTER — Emergency Department (HOSPITAL_BASED_OUTPATIENT_CLINIC_OR_DEPARTMENT_OTHER): Payer: BC Managed Care – PPO

## 2021-12-06 ENCOUNTER — Encounter (HOSPITAL_BASED_OUTPATIENT_CLINIC_OR_DEPARTMENT_OTHER): Payer: Self-pay

## 2021-12-06 ENCOUNTER — Inpatient Hospital Stay (HOSPITAL_BASED_OUTPATIENT_CLINIC_OR_DEPARTMENT_OTHER)
Admission: EM | Admit: 2021-12-06 | Discharge: 2021-12-09 | DRG: 759 | Disposition: A | Discharge status: Home / Self Care | Payer: BC Managed Care – PPO | Attending: Obstetrics and Gynecology | Admitting: Obstetrics and Gynecology

## 2021-12-06 ENCOUNTER — Other Ambulatory Visit: Payer: Self-pay | Admitting: Obstetrics and Gynecology

## 2021-12-06 DIAGNOSIS — R109 Unspecified abdominal pain: Secondary | ICD-10-CM | POA: Diagnosis not present

## 2021-12-06 DIAGNOSIS — Z96653 Presence of artificial knee joint, bilateral: Secondary | ICD-10-CM | POA: Diagnosis present

## 2021-12-06 DIAGNOSIS — N7093 Salpingitis and oophoritis, unspecified: Secondary | ICD-10-CM | POA: Diagnosis not present

## 2021-12-06 DIAGNOSIS — E119 Type 2 diabetes mellitus without complications: Secondary | ICD-10-CM | POA: Diagnosis present

## 2021-12-06 DIAGNOSIS — E1122 Type 2 diabetes mellitus with diabetic chronic kidney disease: Secondary | ICD-10-CM | POA: Diagnosis present

## 2021-12-06 DIAGNOSIS — I129 Hypertensive chronic kidney disease with stage 1 through stage 4 chronic kidney disease, or unspecified chronic kidney disease: Secondary | ICD-10-CM | POA: Diagnosis present

## 2021-12-06 DIAGNOSIS — Z90710 Acquired absence of both cervix and uterus: Secondary | ICD-10-CM

## 2021-12-06 DIAGNOSIS — N1831 Chronic kidney disease, stage 3a: Secondary | ICD-10-CM

## 2021-12-06 LAB — URINALYSIS, ROUTINE W REFLEX MICROSCOPIC
Bilirubin Urine: NEGATIVE
Glucose, UA: >1000 mg/dL — AB
Hgb urine dipstick: NEGATIVE
Ketones, ur: NEGATIVE mg/dL
Leukocytes,Ua: NEGATIVE
Nitrite: NEGATIVE
Protein, ur: NEGATIVE mg/dL
Specific Gravity, Urine: 1.016 (ref 1.005–1.030)
pH: 6.5 (ref 5.0–8.0)

## 2021-12-06 LAB — CBC
HCT: 39.2 % (ref 36.0–46.0)
Hemoglobin: 13.1 g/dL (ref 12.0–15.0)
MCH: 28 pg (ref 26.0–34.0)
MCHC: 33.4 g/dL (ref 30.0–36.0)
MCV: 83.8 fL (ref 80.0–100.0)
Platelets: 265 10*3/uL (ref 150–400)
RBC: 4.68 MIL/uL (ref 3.87–5.11)
RDW: 15 % (ref 11.5–15.5)
WBC: 13.8 10*3/uL — ABNORMAL HIGH (ref 4.0–10.5)
nRBC: 0 % (ref 0.0–0.2)

## 2021-12-06 LAB — COMPREHENSIVE METABOLIC PANEL
ALT: 12 U/L (ref 0–44)
AST: 15 U/L (ref 15–41)
Albumin: 4.4 g/dL (ref 3.5–5.0)
Alkaline Phosphatase: 74 U/L (ref 38–126)
Anion gap: 9 (ref 5–15)
BUN: 13 mg/dL (ref 6–20)
CO2: 26 mmol/L (ref 22–32)
Calcium: 9.5 mg/dL (ref 8.9–10.3)
Chloride: 102 mmol/L (ref 98–111)
Creatinine, Ser: 1.29 mg/dL — ABNORMAL HIGH (ref 0.44–1.00)
GFR, Estimated: 48 mL/min — ABNORMAL LOW (ref >60)
Glucose, Bld: 148 mg/dL — ABNORMAL HIGH (ref 70–99)
Potassium: 3.5 mmol/L (ref 3.5–5.1)
Sodium: 137 mmol/L (ref 135–145)
Total Bilirubin: 0.9 mg/dL (ref 0.3–1.2)
Total Protein: 7.8 g/dL (ref 6.5–8.1)

## 2021-12-06 LAB — LIPASE, BLOOD: Lipase: 21 U/L (ref 11–51)

## 2021-12-06 MED ORDER — METRONIDAZOLE 500 MG/100ML IV SOLN
500.0000 mg | Freq: Once | INTRAVENOUS | Status: AC
Start: 2021-12-06 — End: 2021-12-07
  Administered 2021-12-07: 500 mg via INTRAVENOUS
  Filled 2021-12-06: qty 100

## 2021-12-06 MED ORDER — SODIUM CHLORIDE 0.9 % IV BOLUS
1000.0000 mL | Freq: Once | INTRAVENOUS | Status: AC
Start: 2021-12-06 — End: 2021-12-06
  Administered 2021-12-06: 1000 mL via INTRAVENOUS

## 2021-12-06 MED ORDER — IOHEXOL 300 MG/ML  SOLN
100.0000 mL | Freq: Once | INTRAMUSCULAR | Status: AC | PRN
  Administered 2021-12-06: 80 mL via INTRAVENOUS

## 2021-12-06 MED ORDER — SODIUM CHLORIDE 0.9 % IV SOLN
2.0000 g | Freq: Once | INTRAVENOUS | Status: AC
Start: 2021-12-06 — End: 2021-12-07
  Administered 2021-12-07: 2 g via INTRAVENOUS
  Filled 2021-12-06: qty 20

## 2021-12-06 MED ORDER — FENTANYL CITRATE PF 50 MCG/ML IJ SOSY
50.0000 ug | PREFILLED_SYRINGE | Freq: Once | INTRAMUSCULAR | Status: AC
  Administered 2021-12-06: 50 ug via INTRAVENOUS
  Filled 2021-12-06: qty 1

## 2021-12-06 MED ORDER — KETOROLAC TROMETHAMINE 15 MG/ML IJ SOLN
15.0000 mg | Freq: Once | INTRAMUSCULAR | Status: AC
Start: 2021-12-06 — End: 2021-12-06
  Administered 2021-12-06: 15 mg via INTRAVENOUS
  Filled 2021-12-06: qty 1

## 2021-12-06 NOTE — ED Notes (Signed)
Patient given microwave dinner tray.  ?

## 2021-12-06 NOTE — ED Provider Notes (Signed)
?[facility] EM[facility] DEPT ?Provider Note ? ? ?CSN: [id number] ?Arrival date & time: 12/06/21  1618 ? ?  ? ?History ? ?Chief Complaint  ?Patient presents with  ? Abdominal Pain  ? ? ?Andrea Robertson is a 61 y.o. female. ? ?Patient is a 61 yo female presenting for abdominal pain. Pt states abdominal pain is located in left lower quadrant, intermittent, worse with movement, 9/10 pain severity with movement, x 24 hours. States pain is worsening. Denies fevers, chills, nausea, vomiting, or diarrhea. Denies vaginal bleeding or dysuria. Admits to pain with bowel movements x 24 hours.  ? ?The history is provided by the patient. No language interpreter was used.  ?Abdominal Pain ?Associated symptoms: no chest pain, no chills, no cough, no dysuria, no fever, no hematuria, no shortness of breath, no sore throat and no vomiting   ? ?  ? ?Home Medications ?Prior to Admission medications   ?Medication Sig Start Date End Date Taking? Authorizing Provider  ?Accu-Chek FastClix Lancets MISC USE TO TEST YOUR BLOOD SUGAR TWICE A DAY DX E11.21 09/03/19   [provider]  ?amLODipine (NORVASC) 10 MG tablet Take 0.5 tablets (5 mg total) by mouth daily. 11/04/21   Yates Decamp, MD  ?aspirin EC 81 MG EC tablet Take 1 tablet (81 mg total) by mouth daily. 07/04/19   Furth, Cadence H, PA-C  ?atorvastatin (LIPITOR) 80 MG tablet Take 1 tablet (80 mg total) by mouth daily at 6 PM. 06/30/20   Lars Masson, MD  ?buPROPion (WELLBUTRIN XL) 300 MG 24 hr tablet Take 300 mg by mouth daily.    [provider]  ?carvedilol (COREG) 25 MG tablet Take 1.5 tablets (37.5 mg total) by mouth 2 (two) times daily. 11/04/21 10/30/22  Yates Decamp, MD  ?dapagliflozin propanediol (FARXIGA) 10 MG TABS tablet Take 1 tablet (10 mg total) by mouth daily before breakfast. 05/19/21   Yates Decamp, MD  ?docusate sodium (COLACE) 100 MG capsule Take 100 mg by mouth daily. 11/24/20   [provider]  ?ezetimibe (ZETIA) 10 MG tablet Take 1  tablet (10 mg total) by mouth daily. 07/08/21 07/03/22  Yates Decamp, MD  ?fexofenadine (ALLEGRA) 180 MG tablet Take 180 mg by mouth daily as needed for allergies.    [provider]  ?fluticasone (FLONASE) 50 MCG/ACT nasal spray Place 1 spray into both nostrils daily as needed for allergies.    [provider]  ?meclizine (ANTIVERT) 25 MG tablet Take 1 tablet (25 mg total) by mouth 3 (three) times daily as needed for dizziness. 10/30/18   Lorre Nick, MD  ?Multiple Vitamin (MULTIVITAMIN WITH MINERALS) TABS tablet Take 1 tablet by mouth daily.    [provider]  ?nitroGLYCERIN (NITROSTAT) 0.4 MG SL tablet Place 1 tablet (0.4 mg total) under the tongue every 5 (five) minutes x 3 doses as needed for chest pain. 07/08/21   Yates Decamp, MD  ?sacubitril-valsartan (ENTRESTO) 49-51 MG Take 1 tablet by mouth 2 (two) times daily. 09/27/21   Yates Decamp, MD  ?   ? ?Allergies    ?Dilaudid [hydromorphone hcl], Erythromycin, Percocet [oxycodone-acetaminophen], Zithromax [azithromycin], Penicillins, Codeine, Oxycodone, Cefprozil, and Other   ? ?Review of Systems   ?Review of Systems  ?Constitutional:  Negative for chills and fever.  ?HENT:  Negative for ear pain and sore throat.   ?Eyes:  Negative for pain and visual disturbance.  ?Respiratory:  Negative for cough and shortness of breath.   ?Cardiovascular:  Negative for chest pain and palpitations.  ?  Gastrointestinal:  Positive for abdominal pain. Negative for vomiting.  ?     Pain with bowel movements ?  ?Genitourinary:  Negative for dysuria and hematuria.  ?Musculoskeletal:  Negative for arthralgias and back pain.  ?Skin:  Negative for color change and rash.  ?Neurological:  Negative for seizures and syncope.  ?All other systems reviewed and are negative. ? ?Physical Exam ?Updated Vital Signs ?BP 115/68 (BP Location: Right Arm)   Pulse 74   Temp 99.3 ?F (37.4 ?C) (Oral)   Resp 14   Ht 5\' 2"  (1.575 m)   Wt 85.1 kg   SpO2 97%   BMI 34.31 kg/m?   ?Physical Exam ?Vitals and nursing note reviewed.  ?Constitutional:   ?   General: She is not in acute distress. ?   Appearance: She is well-developed.  ?HENT:  ?   Head: Normocephalic and atraumatic.  ?Eyes:  ?   Conjunctiva/sclera: Conjunctivae normal.  ?Cardiovascular:  ?   Rate and Rhythm: Normal rate and regular rhythm.  ?   Heart sounds: No murmur heard. ?Pulmonary:  ?   Effort: Pulmonary effort is normal. No respiratory distress.  ?   Breath sounds: Normal breath sounds.  ?Abdominal:  ?   Palpations: Abdomen is soft.  ?   Tenderness: There is abdominal tenderness in the left lower quadrant. There is no guarding or rebound.  ?Musculoskeletal:     ?   General: No swelling.  ?   Cervical back: Neck supple.  ?Skin: ?   General: Skin is warm and dry.  ?   Capillary Refill: Capillary refill takes less than 2 seconds.  ?Neurological:  ?   Mental Status: She is alert.  ?Psychiatric:     ?   Mood and Affect: Mood normal.  ? ? ?ED Results / Procedures / Treatments   ?Labs ?(all labs ordered are listed, but only abnormal results are displayed) ?Labs Reviewed  ?COMPREHENSIVE METABOLIC PANEL - Abnormal; Notable for the following components:  ?    Result Value  ? Glucose, Bld 148 (*)   ? Creatinine, Ser 1.29 (*)   ? GFR, Estimated 48 (*)   ? All other components within normal limits  ?CBC - Abnormal; Notable for the following components:  ? WBC 13.8 (*)   ? All other components within normal limits  ?URINALYSIS, ROUTINE W REFLEX MICROSCOPIC - Abnormal; Notable for the following components:  ? Glucose, UA >1,000 (*)   ? All other components within normal limits  ?LIPASE, BLOOD  ? ? ?EKG ?None ? ?Radiology ?CT ABDOMEN PELVIS W CONTRAST ? ?Result Date: 12/06/2021 ?CLINICAL DATA:  Left lower quadrant abdominal pain EXAM: CT ABDOMEN AND PELVIS WITH CONTRAST TECHNIQUE: Multidetector CT imaging of the abdomen and pelvis was performed using the standard protocol following bolus administration of intravenous contrast. RADIATION  DOSE REDUCTION: This exam was performed according to the departmental dose-optimization program which includes automated exposure control, adjustment of the mA and/or kV according to patient size and/or use of iterative reconstruction technique. CONTRAST:  61mL OMNIPAQUE IOHEXOL 300 MG/ML  SOLN COMPARISON:  11/28/2015 FINDINGS: Lower chest: No acute abnormality. Hepatobiliary: No focal liver abnormality is seen. Status post cholecystectomy. No biliary dilatation. Pancreas: Unremarkable. No pancreatic ductal dilatation or surrounding inflammatory changes. Spleen: Normal in size without focal abnormality. Adrenals/Urinary Tract: Unremarkable adrenal glands. Multiple small bilateral renal cysts, largest measuring up to 1.7 cm at the lower pole of the left kidney. No follow-up needed. No renal stone or hydronephrosis. Urinary bladder within  normal limits. Stomach/Bowel: Small hiatal hernia. Stomach otherwise within normal limits. No dilated loops of bowel. There are numerous diverticula throughout the colon. Mildly thickened segment of sigmoid colon adjacent to pelvic fluid collection. Air-filled appendix extends into the midline of the pelvis closely associated with fluid collection (series 6, images 47-48). Vascular/Lymphatic: No significant vascular findings are present. No enlarged abdominal or pelvic lymph nodes. Reproductive: Surgically absent uterus. Complex fluid collection within the deep central pelvis with internal septations and areas of peripheral nodularity. Collection measures approximately 8.2 x 5.4 x 8.0 cm (series 3, image 60). Collection is intimately associated with the bilateral adnexal regions. Other: No ascites.  No pneumoperitoneum. Musculoskeletal: No acute or significant osseous findings. IMPRESSION: 1. Complex fluid collection within the deep central pelvis measuring approximately 8 x 5 x 8 cm with internal septations and areas of peripheral nodularity. Collection is intimately associated with  the bilateral adnexal regions. Findings are concerning for abscess, possibly tubo-ovarian in origin. Collection also abuts the appendix and sigmoid colon and could potentially be arising from a bowel source. 2. [place]

## 2021-12-06 NOTE — ED Triage Notes (Signed)
Patient here POV from Home with ABD Pain. ? ?Pain is more localized to LLQ and radiates to Mid/Right ABD. Began Sunday AM and remained Constant since. [facility] in [place]. ? ?No N/V/D. No Constipation. No Diarrhea. No Known Fevers.  ? ?NAD Noted during Triage. A&Ox4. GCS 15. Ambulatory.  ?

## 2021-12-06 NOTE — ED Provider Triage Note (Signed)
Emergency Medicine Provider Triage Evaluation Note ? ?Andrea Robertson , a 61 y.o. female  was evaluated in triage.  Pt complains of abdominal pain. States that same began suddenly yesterday and has been worsening since. States that the pain is located on the left side and waxes and wanes. States that she had a bowel movement this morning which was extremely painful for her. She states that she has a history of perforated diverticulitis in the past. She denies fevers, chills, nausea, vomiting, or diarrhea ? ?Review of Systems  ?Positive:  ?Negative:  ? ?Physical Exam  ?BP 124/87 (BP Location: Right Arm)   Pulse 72   Temp 99.3 ?F (37.4 ?C) (Oral)   Resp 16   Ht 5\' 2"  (1.575 m)   Wt 85.1 kg   SpO2 100%   BMI 34.31 kg/m?  ?Gen:   Awake, no distress   ?Resp:  Normal effort  ?MSK:   Moves extremities without difficulty  ?Other:  LLQ tenderness ? ?Medical Decision Making  ?Medically screening exam initiated at 4:47 PM.  Appropriate orders placed.  was informed that the remainder of the evaluation will be completed by another provider, this initial triage assessment does not replace that evaluation, and the importance of remaining in the ED until their evaluation is complete. ? ? ?  ?Andrea Haven, PA-C ?12/06/21 1650 ? ?

## 2021-12-07 DIAGNOSIS — N7093 Salpingitis and oophoritis, unspecified: Secondary | ICD-10-CM

## 2021-12-07 DIAGNOSIS — Z90710 Acquired absence of both cervix and uterus: Secondary | ICD-10-CM | POA: Diagnosis not present

## 2021-12-07 DIAGNOSIS — N1831 Chronic kidney disease, stage 3a: Secondary | ICD-10-CM | POA: Diagnosis present

## 2021-12-07 DIAGNOSIS — Z96653 Presence of artificial knee joint, bilateral: Secondary | ICD-10-CM | POA: Diagnosis present

## 2021-12-07 DIAGNOSIS — E119 Type 2 diabetes mellitus without complications: Secondary | ICD-10-CM | POA: Diagnosis present

## 2021-12-07 DIAGNOSIS — E1122 Type 2 diabetes mellitus with diabetic chronic kidney disease: Secondary | ICD-10-CM | POA: Diagnosis present

## 2021-12-07 DIAGNOSIS — R109 Unspecified abdominal pain: Secondary | ICD-10-CM | POA: Diagnosis present

## 2021-12-07 DIAGNOSIS — I129 Hypertensive chronic kidney disease with stage 1 through stage 4 chronic kidney disease, or unspecified chronic kidney disease: Secondary | ICD-10-CM | POA: Diagnosis present

## 2021-12-07 HISTORY — DX: Salpingitis and oophoritis, unspecified: N70.93

## 2021-12-07 MED ORDER — ONDANSETRON HCL 4 MG/2ML IJ SOLN
4.0000 mg | Freq: Four times a day (QID) | INTRAMUSCULAR | Status: DC | PRN
Start: 2021-12-07 — End: 2021-12-09

## 2021-12-07 MED ORDER — OXYCODONE-ACETAMINOPHEN 5-325 MG PO TABS
1.0000 | ORAL_TABLET | Freq: Four times a day (QID) | ORAL | Status: DC | PRN
  Administered 2021-12-07: 1 via ORAL
  Filled 2021-12-07: qty 1

## 2021-12-07 MED ORDER — SIMETHICONE 80 MG PO CHEW
80.0000 mg | CHEWABLE_TABLET | Freq: Four times a day (QID) | ORAL | Status: DC | PRN
Start: 2021-12-07 — End: 2021-12-07
  Filled 2021-12-07: qty 1

## 2021-12-07 MED ORDER — SODIUM CHLORIDE 0.9% FLUSH
3.0000 mL | INTRAVENOUS | Status: DC | PRN
Start: 2021-12-07 — End: 2021-12-09
  Filled 2021-12-07: qty 3

## 2021-12-07 MED ORDER — SIMETHICONE 80 MG PO CHEW
80.0000 mg | CHEWABLE_TABLET | Freq: Four times a day (QID) | ORAL | Status: DC | PRN
Start: 2021-12-07 — End: 2021-12-09
  Filled 2021-12-07: qty 1

## 2021-12-07 MED ORDER — LORATADINE 10 MG PO TABS
10.0000 mg | ORAL_TABLET | Freq: Every day | ORAL | Status: DC
  Administered 2021-12-08 – 2021-12-09 (×2): 10 mg via ORAL
  Filled 2021-12-07 (×3): qty 1

## 2021-12-07 MED ORDER — HYDROMORPHONE HCL 1 MG/ML IJ SOLN: 1.0000 mg | INTRAMUSCULAR | Status: DC | PRN

## 2021-12-07 MED ORDER — ZOLPIDEM TARTRATE 5 MG PO TABS
5.0000 mg | ORAL_TABLET | Freq: Every evening | ORAL | Status: DC | PRN
  Filled 2021-12-07: qty 1

## 2021-12-07 MED ORDER — SODIUM CHLORIDE 0.9 % IV SOLN
250.0000 mL | INTRAVENOUS | Status: DC | PRN
  Administered 2021-12-08: 250 mL via INTRAVENOUS

## 2021-12-07 MED ORDER — CEFOXITIN SODIUM 2 G IV SOLR
2.0000 g | Freq: Four times a day (QID) | INTRAVENOUS | Status: DC
  Administered 2021-12-07 – 2021-12-09 (×10): 2 g via INTRAVENOUS
  Filled 2021-12-07 (×13): qty 2

## 2021-12-07 MED ORDER — CARVEDILOL 25 MG PO TABS
37.5000 mg | ORAL_TABLET | Freq: Two times a day (BID) | ORAL | Status: DC
  Administered 2021-12-07 – 2021-12-09 (×5): 37.5 mg via ORAL
  Filled 2021-12-07 (×6): qty 1

## 2021-12-07 MED ORDER — ONDANSETRON HCL 4 MG PO TABS
4.0000 mg | ORAL_TABLET | Freq: Four times a day (QID) | ORAL | Status: DC | PRN
  Filled 2021-12-07: qty 1

## 2021-12-07 MED ORDER — ATORVASTATIN CALCIUM 80 MG PO TABS
80.0000 mg | ORAL_TABLET | Freq: Every day | ORAL | Status: DC
  Administered 2021-12-07 – 2021-12-08 (×2): 80 mg via ORAL
  Filled 2021-12-07 (×2): qty 1

## 2021-12-07 MED ORDER — PRENATAL MULTIVITAMIN CH
1.0000 | ORAL_TABLET | Freq: Every day | ORAL | Status: DC
  Administered 2021-12-08: 1 via ORAL
  Filled 2021-12-07 (×2): qty 1

## 2021-12-07 MED ORDER — SODIUM CHLORIDE 0.9% FLUSH
3.0000 mL | Freq: Two times a day (BID) | INTRAVENOUS | Status: DC
Start: 2021-12-07 — End: 2021-12-09
  Filled 2021-12-07: qty 3

## 2021-12-07 MED ORDER — DAPAGLIFLOZIN PROPANEDIOL 10 MG PO TABS
10.0000 mg | ORAL_TABLET | Freq: Every day | ORAL | Status: DC
  Administered 2021-12-07 – 2021-12-09 (×3): 10 mg via ORAL
  Filled 2021-12-07 (×4): qty 1

## 2021-12-07 MED ORDER — OXYCODONE-ACETAMINOPHEN 5-325 MG PO TABS: 1.0000 | ORAL_TABLET | ORAL | Status: DC | PRN

## 2021-12-07 MED ORDER — ONDANSETRON HCL 4 MG/2ML IJ SOLN: 4.0000 mg | Freq: Four times a day (QID) | INTRAMUSCULAR | Status: DC | PRN

## 2021-12-07 MED ORDER — EZETIMIBE 10 MG PO TABS
10.0000 mg | ORAL_TABLET | Freq: Every day | ORAL | Status: DC
Start: 2021-12-07 — End: 2021-12-09
  Administered 2021-12-07 – 2021-12-09 (×3): 10 mg via ORAL
  Filled 2021-12-07 (×3): qty 1

## 2021-12-07 MED ORDER — AMLODIPINE BESYLATE 5 MG PO TABS
5.0000 mg | ORAL_TABLET | Freq: Every day | ORAL | Status: DC
  Administered 2021-12-07 – 2021-12-09 (×3): 5 mg via ORAL
  Filled 2021-12-07 (×3): qty 1

## 2021-12-07 MED ORDER — NITROGLYCERIN 0.4 MG SL SUBL: 0.4000 mg | SUBLINGUAL_TABLET | SUBLINGUAL | Status: DC | PRN

## 2021-12-07 MED ORDER — ASPIRIN EC 81 MG PO TBEC
81.0000 mg | DELAYED_RELEASE_TABLET | Freq: Every day | ORAL | Status: DC
  Administered 2021-12-07 – 2021-12-09 (×3): 81 mg via ORAL
  Filled 2021-12-07 (×3): qty 1

## 2021-12-07 MED ORDER — SODIUM CHLORIDE 0.9 % IV SOLN: 100.0000 mg | Freq: Two times a day (BID) | INTRAVENOUS | Status: DC

## 2021-12-07 MED ORDER — SODIUM CHLORIDE 0.9% FLUSH
3.0000 mL | Freq: Two times a day (BID) | INTRAVENOUS | Status: DC
  Administered 2021-12-09: 3 mL via INTRAVENOUS
  Filled 2021-12-07: qty 3

## 2021-12-07 MED ORDER — SACUBITRIL-VALSARTAN 49-51 MG PO TABS
1.0000 | ORAL_TABLET | Freq: Two times a day (BID) | ORAL | Status: DC
  Administered 2021-12-07 – 2021-12-09 (×5): 1 via ORAL
  Filled 2021-12-07 (×5): qty 1

## 2021-12-07 MED ORDER — FLUTICASONE PROPIONATE 50 MCG/ACT NA SUSP
1.0000 | Freq: Every day | NASAL | Status: DC | PRN
  Filled 2021-12-07: qty 16

## 2021-12-07 MED ORDER — SODIUM CHLORIDE 0.9% FLUSH
3.0000 mL | INTRAVENOUS | Status: DC | PRN
  Filled 2021-12-07: qty 3

## 2021-12-07 MED ORDER — METRONIDAZOLE 500 MG/100ML IV SOLN
500.0000 mg | Freq: Two times a day (BID) | INTRAVENOUS | Status: DC
  Administered 2021-12-07 – 2021-12-09 (×5): 500 mg via INTRAVENOUS
  Filled 2021-12-07 (×6): qty 100

## 2021-12-07 MED ORDER — BUPROPION HCL ER (XL) 300 MG PO TB24
300.0000 mg | ORAL_TABLET | Freq: Every day | ORAL | Status: DC
  Administered 2021-12-07 – 2021-12-09 (×3): 300 mg via ORAL
  Filled 2021-12-07 (×3): qty 1

## 2021-12-07 MED ORDER — SODIUM CHLORIDE 0.9 % IV SOLN: 2.0000 g | Freq: Four times a day (QID) | INTRAVENOUS | Status: DC

## 2021-12-07 MED ORDER — SODIUM CHLORIDE 0.9 % IV SOLN
100.0000 mg | Freq: Two times a day (BID) | INTRAVENOUS | Status: DC
  Administered 2021-12-07 – 2021-12-09 (×6): 100 mg via INTRAVENOUS
  Filled 2021-12-07 (×7): qty 100

## 2021-12-07 MED ORDER — ESCITALOPRAM OXALATE 10 MG PO TABS
10.0000 mg | ORAL_TABLET | Freq: Every day | ORAL | Status: DC
  Administered 2021-12-07 – 2021-12-09 (×3): 10 mg via ORAL
  Filled 2021-12-07 (×3): qty 1

## 2021-12-07 MED ORDER — DOCUSATE SODIUM 100 MG PO CAPS
100.0000 mg | ORAL_CAPSULE | Freq: Every day | ORAL | Status: DC
  Administered 2021-12-07 – 2021-12-08 (×2): 100 mg via ORAL
  Filled 2021-12-07 (×3): qty 1

## 2021-12-07 MED ORDER — SODIUM CHLORIDE 0.9 % IV SOLN
250.0000 mL | INTRAVENOUS | Status: DC | PRN
  Administered 2021-12-07: 250 mL via INTRAVENOUS

## 2021-12-07 MED ORDER — ACETAMINOPHEN 325 MG PO TABS
650.0000 mg | ORAL_TABLET | ORAL | Status: DC | PRN
  Administered 2021-12-07: 650 mg via ORAL
  Filled 2021-12-07: qty 2

## 2021-12-07 MED ORDER — ONDANSETRON HCL 4 MG PO TABS
4.0000 mg | ORAL_TABLET | Freq: Four times a day (QID) | ORAL | Status: DC | PRN
  Administered 2021-12-07: 4 mg via ORAL

## 2021-12-07 NOTE — H&P (Signed)
Andrea Robertson is an 61 y.o. female who presented to UC for low back and abd pain. W/U included CT scan which revealed potential TOA. Pt was transferred to the GYN service for further evaluation and management. ? ?Pt reports her Sx started Sunday morning. She had been working in the yard on Saturday and just thought she had strained her back. States pain was constant, sharp and stabbing with movement form her low back to LLQ. Low grade temp but no chills or N/V.  ?Normal BM yesterday. ? ?H/O hysterectomy @ 15 yrs ago. ?H/O MI with CHF, followed by cardiology ?H/O CKD, Stage 3 ?H/O diverticulosis ?H/O bowel perforation with resection, May 2022 ? ? ? ?Menstrual History: ?Menarche age: 78 ?No LMP recorded. Patient has had a hysterectomy. ?  ? ?Past Medical History:  ?Diagnosis Date  ? Anxiety   ? Arthritis   ? Chronic kidney disease   ? Coronary artery disease involving native coronary artery of native heart without angina pectoris 07/04/2019  ? Depression   ? Essential hypertension 10/30/2018  ? GERD (gastroesophageal reflux disease)   ? Hyperlipidemia 07/04/2019  ? Myocardial infarction [facility])   ? Primary localized osteoarthritis of left knee   ? S/P total knee arthroplasty, right   ? ? ?Past Surgical History:  ?Procedure Laterality Date  ? ABDOMINAL HYSTERECTOMY    ? CHOLECYSTECTOMY    ? CORONARY STENT INTERVENTION N/A 07/03/2019  ? Procedure: CORONARY STENT INTERVENTION;  Surgeon: Sherren Mocha, MD;  Location: [place] [facility];  Service: Cardiovascular;  Laterality: N/A;  ? LEFT HEART CATH AND CORONARY ANGIOGRAPHY N/A 07/03/2019  ? Procedure: LEFT HEART CATH AND CORONARY ANGIOGRAPHY;  Surgeon: Sherren Mocha, MD;  Location: [place] [facility];  Service: Cardiovascular;  Laterality: N/A;  ? RIGHT/LEFT HEART CATH AND CORONARY ANGIOGRAPHY N/A 07/27/2021  ? Procedure: RIGHT/LEFT HEART CATH AND CORONARY ANGIOGRAPHY;  Surgeon: Adrian Prows, MD;  Location: [place] [facility];  Service: Cardiovascular;  Laterality:  N/A;  ? TOTAL KNEE ARTHROPLASTY Right 10/13/2015  ? Procedure: RIGHT TOTAL KNEE ARTHROPLASTY;  Surgeon: Latanya Maudlin, MD;  Location: [facility];  Service: Orthopedics;  Laterality: Right;  ? TOTAL KNEE ARTHROPLASTY Left 11/23/2020  ? Procedure: TOTAL KNEE ARTHROPLASTY;  Surgeon: Elsie Saas, MD;  Location: [facility];  Service: Orthopedics;  Laterality: Left;  ? ? ?Family History  ?Problem Relation Age of Onset  ? Heart disease Mother   ? Multiple myeloma Mother   ? Heart attack Father 75  ? CAD Father   ? Hypertension Sister   ? Hypertension Sister   ? Hypertension Sister   ? Heart failure Brother   ? ? ?Social History:  reports that she has never smoked. She has never used smokeless tobacco. She reports that she does not currently use alcohol. She reports that she does not use drugs. ? ?Allergies:  ?Allergies  ?Allergen Reactions  ? Dilaudid [Hydromorphone Hcl] Hives  ?  Can tolerate if premedicated with benadryl  ? Erythromycin Nausea And Vomiting  ? Percocet [Oxycodone-Acetaminophen] Itching and Nausea And Vomiting  ?  Can tolerate with premedication of diphenhydramine  ? Zithromax [Azithromycin] Nausea And Vomiting  ? Penicillins Hives and Nausea And Vomiting  ? Codeine Nausea Only  ? Oxycodone   ?  Other reaction(s): nausea, epigastric pain  ? Cefprozil Rash  ?  rash but takes Ceftin  ? Other Rash  ?  Gel on Ekg pads   ? ? ?Medications Prior to Admission  ?Medication Sig Dispense Refill Last  Dose  ? Accu-Chek FastClix Lancets MISC USE TO TEST YOUR BLOOD SUGAR TWICE A DAY DX E11.21     ? amLODipine (NORVASC) 10 MG tablet Take 0.5 tablets (5 mg total) by mouth daily. 90 tablet 3   ? aspirin EC 81 MG EC tablet Take 1 tablet (81 mg total) by mouth daily. 90 tablet 3   ? atorvastatin (LIPITOR) 80 MG tablet Take 1 tablet (80 mg total) by mouth daily at 6 PM. 90 tablet 3   ? buPROPion (WELLBUTRIN XL) 300 MG 24 hr tablet Take 300 mg by mouth daily.     ? carvedilol (COREG) 25 MG tablet Take 1.5 tablets (37.5 mg total) by  mouth 2 (two) times daily. 270 tablet 3   ? dapagliflozin propanediol (FARXIGA) 10 MG TABS tablet Take 1 tablet (10 mg total) by mouth daily before breakfast. 90 tablet 3   ? docusate sodium (COLACE) 100 MG capsule Take 100 mg by mouth daily.     ? ezetimibe (ZETIA) 10 MG tablet Take 1 tablet (10 mg total) by mouth daily. 90 tablet 3   ? fexofenadine (ALLEGRA) 180 MG tablet Take 180 mg by mouth daily as needed for allergies.     ? fluticasone (FLONASE) 50 MCG/ACT nasal spray Place 1 spray into both nostrils daily as needed for allergies.     ? meclizine (ANTIVERT) 25 MG tablet Take 1 tablet (25 mg total) by mouth 3 (three) times daily as needed for dizziness. 30 tablet 0   ? Multiple Vitamin (MULTIVITAMIN WITH MINERALS) TABS tablet Take 1 tablet by mouth daily.     ? nitroGLYCERIN (NITROSTAT) 0.4 MG SL tablet Place 1 tablet (0.4 mg total) under the tongue every 5 (five) minutes x 3 doses as needed for chest pain. 25 tablet 1   ? sacubitril-valsartan (ENTRESTO) 49-51 MG Take 1 tablet by mouth 2 (two) times daily. 60 tablet 2   ? ? ?Review of Systems  ?Constitutional: Negative.   ?Respiratory: Negative.    ?Cardiovascular: Negative.   ?Gastrointestinal:  Positive for abdominal pain.  ?Genitourinary: Negative.   ? ?Blood pressure 130/63, pulse 71, temperature 99 ?F (37.2 ?C), temperature source Oral, resp. rate 18, height '5\' 2"'  (1.575 m), weight 85.1 kg, SpO2 98 %. ?Physical Exam ?Constitutional:   ?   Appearance: Normal appearance. She is well-developed.  ?Cardiovascular:  ?   Rate and Rhythm: Normal rate and regular rhythm.  ?Pulmonary:  ?   Effort: Pulmonary effort is normal.  ?   Breath sounds: Normal breath sounds.  ?Abdominal:  ?   General: Bowel sounds are normal.  ?   Palpations: Abdomen is soft.  ?   Tenderness: There is abdominal tenderness.  ?Genitourinary: ?   Comments: Deferred ? ? ?Results for orders placed or performed during the hospital encounter of 12/06/21 (from the past 24 hour(s))  ?Lipase, blood      Status: None  ? Collection Time: 12/06/21  4:54 PM  ?Result Value Ref Range  ? Lipase 21 11 - 51 U/L  ?Comprehensive metabolic panel     Status: Abnormal  ? Collection Time: 12/06/21  4:54 PM  ?Result Value Ref Range  ? Sodium 137 135 - 145 mmol/L  ? Potassium 3.5 3.5 - 5.1 mmol/L  ? Chloride 102 98 - 111 mmol/L  ? CO2 26 22 - 32 mmol/L  ? Glucose, Bld 148 (H) 70 - 99 mg/dL  ? BUN 13 6 - 20 mg/dL  ? Creatinine, Ser 1.29 (H) 0.44 -  1.00 mg/dL  ? Calcium 9.5 8.9 - 10.3 mg/dL  ? Total Protein 7.8 6.5 - 8.1 g/dL  ? Albumin 4.4 3.5 - 5.0 g/dL  ? AST 15 15 - 41 U/L  ? ALT 12 0 - 44 U/L  ? Alkaline Phosphatase 74 38 - 126 U/L  ? Total Bilirubin 0.9 0.3 - 1.2 mg/dL  ? GFR, Estimated 48 (L) >60 mL/min  ? Anion gap 9 5 - 15  ?CBC     Status: Abnormal  ? Collection Time: 12/06/21  4:54 PM  ?Result Value Ref Range  ? WBC 13.8 (H) 4.0 - 10.5 K/uL  ? RBC 4.68 3.87 - 5.11 MIL/uL  ? Hemoglobin 13.1 12.0 - 15.0 g/dL  ? HCT 39.2 36.0 - 46.0 %  ? MCV 83.8 80.0 - 100.0 fL  ? MCH 28.0 26.0 - 34.0 pg  ? MCHC 33.4 30.0 - 36.0 g/dL  ? RDW 15.0 11.5 - 15.5 %  ? Platelets 265 150 - 400 K/uL  ? nRBC 0.0 0.0 - 0.2 %  ?Urinalysis, Routine w reflex microscopic Urine, Clean Catch     Status: Abnormal  ? Collection Time: 12/06/21  4:54 PM  ?Result Value Ref Range  ? Color, Urine YELLOW YELLOW  ? APPearance CLEAR CLEAR  ? Specific Gravity, Urine 1.016 1.005 - 1.030  ? pH 6.5 5.0 - 8.0  ? Glucose, UA >1,000 (A) NEGATIVE mg/dL  ? Hgb urine dipstick NEGATIVE NEGATIVE  ? Bilirubin Urine NEGATIVE NEGATIVE  ? Ketones, ur NEGATIVE NEGATIVE mg/dL  ? Protein, ur NEGATIVE NEGATIVE mg/dL  ? Nitrite NEGATIVE NEGATIVE  ? Leukocytes,Ua NEGATIVE NEGATIVE  ? RBC / HPF 0-5 0 - 5 RBC/hpf  ? WBC, UA 0-5 0 - 5 WBC/hpf  ? Squamous Epithelial / LPF 0-5 0 - 5  ? ? ?CT ABDOMEN PELVIS W CONTRAST ? ?Result Date: 12/06/2021 ?CLINICAL DATA:  Left lower quadrant abdominal pain EXAM: CT ABDOMEN AND PELVIS WITH CONTRAST TECHNIQUE: Multidetector CT imaging of the abdomen and  pelvis was performed using the standard protocol following bolus administration of intravenous contrast. RADIATION DOSE REDUCTION: This exam was performed according to the departmental dose-optimization pro

## 2021-12-08 ENCOUNTER — Encounter (HOSPITAL_COMMUNITY): Payer: Self-pay | Admitting: Obstetrics and Gynecology

## 2021-12-08 LAB — CBC
HCT: 38.7 % (ref 36.0–46.0)
Hemoglobin: 13 g/dL (ref 12.0–15.0)
MCH: 28.6 pg (ref 26.0–34.0)
MCHC: 33.6 g/dL (ref 30.0–36.0)
MCV: 85.1 fL (ref 80.0–100.0)
Platelets: 255 10*3/uL (ref 150–400)
RBC: 4.55 MIL/uL (ref 3.87–5.11)
RDW: 14.8 % (ref 11.5–15.5)
WBC: 10.9 10*3/uL — ABNORMAL HIGH (ref 4.0–10.5)
nRBC: 0 % (ref 0.0–0.2)

## 2021-12-08 MED ORDER — FLUCONAZOLE 150 MG PO TABS
150.0000 mg | ORAL_TABLET | ORAL | Status: DC | PRN
  Administered 2021-12-08: 150 mg via ORAL
  Filled 2021-12-08: qty 1

## 2021-12-08 MED ORDER — ALPRAZOLAM 0.5 MG PO TABS
1.0000 mg | ORAL_TABLET | Freq: Once | ORAL | Status: DC
  Filled 2021-12-08: qty 2

## 2021-12-08 MED ORDER — BISACODYL 5 MG PO TBEC
5.0000 mg | DELAYED_RELEASE_TABLET | Freq: Every day | ORAL | Status: DC | PRN
  Administered 2021-12-08: 5 mg via ORAL
  Filled 2021-12-08: qty 1

## 2021-12-08 NOTE — Progress Notes (Signed)
Gynecology Progress Note  ?Admission Date: 12/06/2021 ?Current Date: 12/08/2021 ?10:33 AM ? ?Andrea Robertson is a 61 y.o. No obstetric history on file. HD#2 admitted for abdominal pain and abdominal mass/abscess ? ?History complicated by: ?Patient Active Problem List  ? Diagnosis Date Noted  ? TOA (tubo-ovarian abscess) 12/07/2021  ? Type 2 diabetes mellitus with stage 3a chronic kidney disease, without long-term current use of insulin ([place]) 08/06/2021  ? Ischemic cardiomyopathy   ? Stage 3a chronic kidney disease (CKD) ([place]) 11/10/2020  ? Obesity 11/10/2020  ? Other allergy status, other than to drugs and biological substances 11/10/2020  ? Recurrent major depression in remission ([place]) 11/10/2020  ? Sickle cell trait ([place]) 11/10/2020  ? Primary localized osteoarthritis of left knee   ? S/P total knee arthroplasty, right   ? Palpitations   ? Hyperlipidemia 07/04/2019  ? CAD (coronary artery disease), native coronary artery 07/04/2019  ? Myocardial infarction ([place]) 07/03/2019  ? Essential hypertension 10/30/2018  ? ? ?ROS and patient/family/surgical history, located on admission H&P note dated 12/06/2021, have been reviewed, and there are no changes except as noted below ?Yesterday/Overnight Events:  ?None significant ? ?Subjective:  ?Pt seen, doing well.  She denies any pain and in fact has not had much pain since admission yesterday.  She denies nausea/vomiting, and only notes a little constipation. ? ?Objective:  ? ?Vitals:  ? 12/07/21 2000 12/07/21 2253 12/08/21 0330 12/08/21 0758  ?BP: 122/69 (!) 107/57 118/66 124/69  ?Pulse: 70 74 72 76  ?Resp: 20 18 17 18   ?Temp: 98.4 ?F (36.9 ?C) 98.4 ?F (36.9 ?C) 98.8 ?F (37.1 ?C) 98.5 ?F (36.9 ?C)  ?TempSrc: Oral Oral Oral Oral  ?SpO2: 99% 99% 94% 98%  ?Weight:      ?Height:      ? ? ?Temp:  [98.1 ?F (36.7 ?C)-98.8 ?F (37.1 ?C)] 98.5 ?F (36.9 ?C) (04/19 [id number]) ?Pulse Rate:  [67-76] 76 (04/19 0758) ?Resp:  [17-20] 18 (04/19 0758) ?BP: (107-124)/(57-69) 124/69 (04/19  0758) ?SpO2:  [94 %-99 %] 98 % (04/19 0758) ?I/O last 3 completed shifts: ?In: 514 [P.O.:360; IV Piggyback:154] ?Out: -  ?No intake/output data recorded. ? ?Intake/Output Summary (Last 24 hours) at 12/08/2021 1033 ?Last data filed at 12/08/2021 0059 ?Gross per 24 hour  ?Intake 360 ml  ?Output --  ?Net 360 ml  ? ? ? Current Vital Signs 24h Vital Sign Ranges  ?T 98.5 ?F (36.9 ?C) Temp  Avg: 98.4 ?F (36.9 ?C)  Min: 98.1 ?F (36.7 ?C)  Max: 98.8 ?F (37.1 ?C)  ?BP 124/69 BP  Min: 107/57  Max: 124/69  ?HR 76 Pulse  Avg: 71.3  Min: 67  Max: 76  ?RR 18 Resp  Avg: 18.8  Min: 17  Max: 20  ?SaO2 98 % Room Air SpO2  Avg: 97.5 %  Min: 94 %  Max: 99 %  ?    ? 24 Hour I/O Current Shift I/O  ?Time ?Ins ?Outs 04/18 0701 - 04/19 0700 ?In: 360 [P.O.:360] ?Out: -  No intake/output data recorded.  ? ?Patient Vitals for the past 12 hrs: ? BP Temp Temp src Pulse Resp SpO2  ?12/08/21 0758 124/69 98.5 ?F (36.9 ?C) Oral 76 18 98 %  ?12/08/21 0330 118/66 98.8 ?F (37.1 ?C) Oral 72 17 94 %  ?12/07/21 2253 (!) 107/57 98.4 ?F (36.9 ?C) Oral 74 18 99 %  ? ? ? ?Patient Vitals for the past 24 hrs: ? BP Temp Temp src Pulse Resp SpO2  ?12/08/21 0758  124/69 98.5 ?F (36.9 ?C) Oral 76 18 98 %  ?12/08/21 0330 118/66 98.8 ?F (37.1 ?C) Oral 72 17 94 %  ?12/07/21 2253 (!) 107/57 98.4 ?F (36.9 ?C) Oral 74 18 99 %  ?12/07/21 2000 122/69 98.4 ?F (36.9 ?C) Oral 70 20 99 %  ?12/07/21 1543 114/63 98.1 ?F (36.7 ?C) Oral 67 20 98 %  ?12/07/21 1146 (!) 111/58 98.1 ?F (36.7 ?C) Oral 69 20 97 %  ? ? ?Physical exam: ?General appearance: alert, cooperative, appears stated age, and no distress ?Abdomen:  soft, nondistended, mild left lower quadrant tenderness.  Midline scar noted. ?GU: No gross VB ?Lungs: clear to auscultation bilaterally ?Heart: regular rate and rhythm ?Extremities: no lower extremity edema ?Skin: WNL ?Psych: appropriate ?Neurologic: Grossly normal ? ?Medications ?Current Facility-Administered Medications  ?Medication Dose Route Frequency Provider Last Rate  Last Admin  ? 0.9 %  sodium chloride infusion  250 mL Intravenous PRN Chancy Milroy, MD 10 mL/hr at 12/07/21 1148 250 mL at 12/07/21 1148  ? 0.9 %  sodium chloride infusion  250 mL Intravenous PRN Chancy Milroy, MD      ? acetaminophen (TYLENOL) tablet 650 mg  650 mg Oral Q4H PRN Griffin Basil, MD   650 mg at 12/07/21 [id number]  ? amLODipine (NORVASC) tablet 5 mg  5 mg Oral Daily Griffin Basil, MD   5 mg at 12/08/21 1006  ? aspirin EC tablet 81 mg  81 mg Oral Daily Griffin Basil, MD   81 mg at 12/08/21 1004  ? atorvastatin (LIPITOR) tablet 80 mg  80 mg Oral q1800 Griffin Basil, MD   80 mg at 12/07/21 1710  ? buPROPion (WELLBUTRIN XL) 24 hr tablet 300 mg  300 mg Oral Daily Griffin Basil, MD   300 mg at 12/08/21 1004  ? carvedilol (COREG) tablet 37.5 mg  37.5 mg Oral BID Griffin Basil, MD   37.5 mg at 12/08/21 1004  ? cefOXitin (MEFOXIN) 2 g in sodium chloride 0.9 % 100 mL IVPB  2 g Intravenous Q6H Chancy Milroy, MD 200 mL/hr at 12/08/21 0612 2 g at 12/08/21 0612  ? dapagliflozin propanediol (FARXIGA) tablet 10 mg  10 mg Oral QAC breakfast Griffin Basil, MD   10 mg at 12/08/21 1006  ? docusate sodium (COLACE) capsule 100 mg  100 mg Oral Daily Constant, Peggy, MD   100 mg at 12/08/21 1008  ? doxycycline (VIBRAMYCIN) 100 mg in sodium chloride 0.9 % 250 mL IVPB  100 mg Intravenous Q12H Chancy Milroy, MD 125 mL/hr at 12/08/21 0059 100 mg at 12/08/21 0059  ? escitalopram (LEXAPRO) tablet 10 mg  10 mg Oral Daily Griffin Basil, MD   10 mg at 12/08/21 1009  ? ezetimibe (ZETIA) tablet 10 mg  10 mg Oral Daily Griffin Basil, MD   10 mg at 12/08/21 1009  ? fluticasone (FLONASE) 50 MCG/ACT nasal spray 1 spray  1 spray Each Nare Daily PRN Griffin Basil, MD      ? HYDROmorphone (DILAUDID) injection 1 mg  1 mg Intravenous Q4H PRN Chancy Milroy, MD      ? HYDROmorphone (DILAUDID) injection 1 mg  1 mg Intravenous Q3H PRN Chancy Milroy, MD      ? loratadine (CLARITIN) tablet 10 mg  10 mg Oral  Daily Griffin Basil, MD   10 mg at 12/08/21 1010  ? metroNIDAZOLE (FLAGYL) IVPB 500 mg  500 mg Intravenous Q12H  Chancy Milroy, MD 100 mL/hr at 12/08/21 0313 500 mg at 12/08/21 0313  ? nitroGLYCERIN (NITROSTAT) SL tablet 0.4 mg  0.4 mg Sublingual Q5 Min x 3 PRN Griffin Basil, MD      ? ondansetron [facility]) tablet 4 mg  4 mg Oral Q6H PRN Chancy Milroy, MD      ? Or  ? ondansetron (ZOFRAN) injection 4 mg  4 mg Intravenous Q6H PRN Chancy Milroy, MD      ? ondansetron [facility]) tablet 4 mg  4 mg Oral Q6H PRN Chancy Milroy, MD   4 mg at 12/07/21 1119  ? Or  ? ondansetron (ZOFRAN) injection 4 mg  4 mg Intravenous Q6H PRN Chancy Milroy, MD      ? oxyCODONE-acetaminophen (PERCOCET/ROXICET) 5-325 MG per tablet 1 tablet  1 tablet Oral Q6H PRN Chancy Milroy, MD   1 tablet at 12/07/21 1119  ? oxyCODONE-acetaminophen (PERCOCET/ROXICET) 5-325 MG per tablet 1 tablet  1 tablet Oral Q4H PRN Chancy Milroy, MD      ? prenatal multivitamin tablet 1 tablet  1 tablet Oral Q1200 Chancy Milroy, MD   1 tablet at 12/08/21 1007  ? sacubitril-valsartan (ENTRESTO) 49-51 mg per tablet  1 tablet Oral BID Griffin Basil, MD   1 tablet at 12/08/21 1007  ? simethicone (MYLICON) chewable tablet 80 mg  80 mg Oral QID PRN Chancy Milroy, MD      ? sodium chloride flush (NS) 0.9 % injection 3 mL  3 mL Intravenous Q12H Chancy Milroy, MD      ? sodium chloride flush (NS) 0.9 % injection 3 mL  3 mL Intravenous PRN Chancy Milroy, MD      ? sodium chloride flush (NS) 0.9 % injection 3 mL  3 mL Intravenous Q12H Chancy Milroy, MD      ? sodium chloride flush (NS) 0.9 % injection 3 mL  3 mL Intravenous PRN Chancy Milroy, MD      ? zolpidem (AMBIEN) tablet 5 mg  5 mg Oral QHS PRN Chancy Milroy, MD      ? ? ? ? ?Labs  ?Recent Labs  ?Lab 12/06/21 ?1654  ?WBC 13.8*  ?HGB 13.1  ?HCT 39.2  ?PLT 265  ? ? ?Recent Labs  ?Lab 12/06/21 ?1654  ?NA 137  ?K 3.5  ?CL 102  ?CO2 26  ?BUN 13  ?CREATININE 1.29*  ?CALCIUM 9.5  ?PROT  7.8  ?BILITOT 0.9  ?ALKPHOS 74  ?ALT 12  ?AST 15  ?GLUCOSE 148*  ? ? ?Radiology ?None since admission ? ?Assessment & Plan:  ?HD#2 ?*GYN: pt stable, not sure if abscess is truly from gyn etiology due to patient

## 2021-12-08 NOTE — Consult Note (Addendum)
? ? ? ? ?Consult Note ? ?Andrea Robertson ?10/26/1960  ?[id number].   ? ?Requesting MD: Dr. Elgie Congo ?Chief Complaint/Reason for Consult: pelvic abscess  ? ?HPI:  ?61 y.o. female with medical history significant for coronary artery disease HTN, GERD, CKD, diverticulitis who presented to urgent care due to lower abdominal and back pain and was sent to [facility] for further work-up. CT scan showed complex fluid collection within the deep central pelvis measuring approximately 8 x 5 x 8 cm concerning for tubo-ovarian abscess and she was admitted to the OB/GYN service at [facility]. ? ?Patient states pain began mildly in the low back on Saturday night and worsened to low back pain and abdominal pain Sunday morning.  Monday morning pain then moved into the left lower quadrant with increasing severity.  She did not have any fever or chills.  She denies nausea and emesis.  She takes daily fiber and normally has 2 bowel movements per day.  Most recent BM this a.m. without pain or blood.  She has been eating and drinking a regular diet since admission without pain.  She denies any abdominal pain currently.  She has history of diverticulitis with perforation back in May 2022.  She underwent a partial colectomy with primary anastomosis at that time in [place].  Since then she [facility] up [facility] general [facility] and has not had issues related to her diverticulosis/diverticulitis.  She states her current symptoms are not similar to those leading up to episode of diverticulitis/perforation. Her last colonoscopy was 10 years ago prior to perforation.  She is scheduled for colonoscopy in 1 month with [facility]. ? ?Other abdominal surgical history includes hysterectomy approximately 15 years ago and cholecystectomy ? ?Husband is bedside ? ?ROS: ?Review of Systems  ?Constitutional:  Negative for chills and fever.  ?Respiratory:  Negative for cough and shortness of breath.   ?Cardiovascular:  Negative for chest pain  and leg swelling.  ?Gastrointestinal:  Positive for abdominal pain. Negative for constipation, diarrhea, nausea and vomiting.  ?Genitourinary: Negative.   ?Musculoskeletal:  Positive for back pain (low).  ? ?Family History  ?Problem Relation Age of Onset  ? Heart disease Mother   ? Multiple myeloma Mother   ? Heart attack Father 79  ? CAD Father   ? Hypertension Sister   ? Hypertension Sister   ? Hypertension Sister   ? Heart failure Brother   ? ? ?Past Medical History:  ?Diagnosis Date  ? Anxiety   ? Arthritis   ? Chronic kidney disease   ? Coronary artery disease involving native coronary artery of native heart without angina pectoris 07/04/2019  ? Depression   ? Essential hypertension 10/30/2018  ? GERD (gastroesophageal reflux disease)   ? Hyperlipidemia 07/04/2019  ? Myocardial infarction [facility])   ? Primary localized osteoarthritis of left knee   ? S/P total knee arthroplasty, right   ? ? ?Past Surgical History:  ?Procedure Laterality Date  ? ABDOMINAL HYSTERECTOMY    ? CHOLECYSTECTOMY    ? CORONARY STENT INTERVENTION N/A 07/03/2019  ? Procedure: CORONARY STENT INTERVENTION;  Surgeon: Sherren Mocha, MD;  Location: [place] [facility];  Service: Cardiovascular;  Laterality: N/A;  ? LEFT HEART CATH AND CORONARY ANGIOGRAPHY N/A 07/03/2019  ? Procedure: LEFT HEART CATH AND CORONARY ANGIOGRAPHY;  Surgeon: Sherren Mocha, MD;  Location: [place] [facility];  Service: Cardiovascular;  Laterality: N/A;  ? RIGHT/LEFT HEART CATH AND CORONARY ANGIOGRAPHY N/A 07/27/2021  ? Procedure: RIGHT/LEFT HEART CATH AND  CORONARY ANGIOGRAPHY;  Surgeon: Adrian Prows, MD;  Location: [place] [facility];  Service: Cardiovascular;  Laterality: N/A;  ? TOTAL KNEE ARTHROPLASTY Right 10/13/2015  ? Procedure: RIGHT TOTAL KNEE ARTHROPLASTY;  Surgeon: Latanya Maudlin, MD;  Location: [facility];  Service: Orthopedics;  Laterality: Right;  ? TOTAL KNEE ARTHROPLASTY Left 11/23/2020  ? Procedure: TOTAL KNEE ARTHROPLASTY;  Surgeon: Elsie Saas, MD;   Location: [facility];  Service: Orthopedics;  Laterality: Left;  ? ? ?Social History:  reports that she has never smoked. She has never used smokeless tobacco. She reports that she does not currently use alcohol. She reports that she does not use drugs. ? ?Allergies:  ?Allergies  ?Allergen Reactions  ? Dilaudid [Hydromorphone Hcl] Hives  ?  Can tolerate if premedicated with benadryl  ? Erythromycin Nausea And Vomiting  ? Percocet [Oxycodone-Acetaminophen] Itching and Nausea And Vomiting  ?  Can tolerate with premedication of diphenhydramine  ? Zithromax [Azithromycin] Nausea And Vomiting  ? Penicillins Hives and Nausea And Vomiting  ? Codeine Nausea Only  ? Oxycodone   ?  Other reaction(s): nausea, epigastric pain  ? Cefprozil Rash  ?  rash but takes Ceftin  ? Other Rash  ?  Gel on Ekg pads   ? ? ?Medications Prior to Admission  ?Medication Sig Dispense Refill  ? Accu-Chek FastClix Lancets MISC USE TO TEST YOUR BLOOD SUGAR TWICE A DAY DX E11.21    ? amLODipine (NORVASC) 10 MG tablet Take 0.5 tablets (5 mg total) by mouth daily. 90 tablet 3  ? aspirin EC 81 MG EC tablet Take 1 tablet (81 mg total) by mouth daily. 90 tablet 3  ? atorvastatin (LIPITOR) 80 MG tablet Take 1 tablet (80 mg total) by mouth daily at 6 PM. 90 tablet 3  ? buPROPion (WELLBUTRIN XL) 300 MG 24 hr tablet Take 300 mg by mouth daily.    ? carvedilol (COREG) 25 MG tablet Take 1.5 tablets (37.5 mg total) by mouth 2 (two) times daily. 270 tablet 3  ? dapagliflozin propanediol (FARXIGA) 10 MG TABS tablet Take 1 tablet (10 mg total) by mouth daily before breakfast. 90 tablet 3  ? docusate sodium (COLACE) 100 MG capsule Take 100 mg by mouth daily.    ? ezetimibe (ZETIA) 10 MG tablet Take 1 tablet (10 mg total) by mouth daily. 90 tablet 3  ? fexofenadine (ALLEGRA) 180 MG tablet Take 180 mg by mouth daily as needed for allergies.    ? fluticasone (FLONASE) 50 MCG/ACT nasal spray Place 1 spray into both nostrils daily as needed for allergies.    ? meclizine  (ANTIVERT) 25 MG tablet Take 1 tablet (25 mg total) by mouth 3 (three) times daily as needed for dizziness. 30 tablet 0  ? Multiple Vitamin (MULTIVITAMIN WITH MINERALS) TABS tablet Take 1 tablet by mouth daily.    ? nitroGLYCERIN (NITROSTAT) 0.4 MG SL tablet Place 1 tablet (0.4 mg total) under the tongue every 5 (five) minutes x 3 doses as needed for chest pain. 25 tablet 1  ? sacubitril-valsartan (ENTRESTO) 49-51 MG Take 1 tablet by mouth 2 (two) times daily. 60 tablet 2  ? ? ?Blood pressure 124/69, pulse 76, temperature 98.5 ?F (36.9 ?C), temperature source Oral, resp. rate 18, height _0  (1.575 m), weight 85.1 kg, SpO2 98 %. ?Physical Exam: ?General: pleasant, WD, female who is sitting up in chair in NAD ?HEENT: head is normocephalic, atraumatic.  Sclera are noninjected.  Pupils equal and round. EOMs intact.  Ears and  nose without any masses or lesions.  Mouth is pink and moist ?Heart: regular, rate, and rhythm.  Palpable radial and pedal pulses bilaterally ?Lungs: Respiratory effort nonlabored on room air ?Abd: soft, NT, ND, +BS, no masses, hernias, or organomegaly ?MSK: all 4 extremities are symmetrical with no cyanosis, clubbing, or edema. ?Skin: warm and dry with no masses, lesions, or rashes ?Psych: A&Ox3 with an appropriate affect.  ? ? ?Results for orders placed or performed during the hospital encounter of 12/06/21 (from the past 48 hour(s))  ?Lipase, blood     Status: None  ? Collection Time: 12/06/21  4:54 PM  ?Result Value Ref Range  ? Lipase 21 11 - 51 U/L  ?  Comment: Performed at [facility], [address] [place], [place] [address]  ?Comprehensive metabolic panel     Status: Abnormal  ? Collection Time: 12/06/21  4:54 PM  ?Result Value Ref Range  ? Sodium 137 135 - 145 mmol/L  ? Potassium 3.5 3.5 - 5.1 mmol/L  ? Chloride 102 98 - 111 mmol/L  ? CO2 26 22 - 32 mmol/L  ? Glucose, Bld 148 (H) 70 - 99 mg/dL  ?  Comment: Glucose reference range applies only to samples taken after  fasting for at least 8 hours.  ? BUN 13 6 - 20 mg/dL  ? Creatinine, Ser 1.29 (H) 0.44 - 1.00 mg/dL  ? Calcium 9.5 8.9 - 10.3 mg/dL  ? Total Protein 7.8 6.5 - 8.1 g/dL  ? Albumin 4.4 3.5 - 5.0 g/dL  ? AST 15 15 -

## 2021-12-09 ENCOUNTER — Inpatient Hospital Stay (HOSPITAL_COMMUNITY): Payer: BC Managed Care – PPO

## 2021-12-09 LAB — CBC WITH DIFFERENTIAL/PLATELET
Abs Immature Granulocytes: 0.05 10*3/uL (ref 0.00–0.07)
Basophils Absolute: 0 10*3/uL (ref 0.0–0.1)
Basophils Relative: 0 %
Eosinophils Absolute: 0.2 10*3/uL (ref 0.0–0.5)
Eosinophils Relative: 2 %
HCT: 37.7 % (ref 36.0–46.0)
Hemoglobin: 13 g/dL (ref 12.0–15.0)
Immature Granulocytes: 1 %
Lymphocytes Relative: 19 %
Lymphs Abs: 2 10*3/uL (ref 0.7–4.0)
MCH: 28.8 pg (ref 26.0–34.0)
MCHC: 34.5 g/dL (ref 30.0–36.0)
MCV: 83.4 fL (ref 80.0–100.0)
Monocytes Absolute: 0.8 10*3/uL (ref 0.1–1.0)
Monocytes Relative: 7 %
Neutro Abs: 7.6 10*3/uL (ref 1.7–7.7)
Neutrophils Relative %: 71 %
Platelets: 269 10*3/uL (ref 150–400)
RBC: 4.52 MIL/uL (ref 3.87–5.11)
RDW: 14.5 % (ref 11.5–15.5)
WBC: 10.7 10*3/uL — ABNORMAL HIGH (ref 4.0–10.5)
nRBC: 0 % (ref 0.0–0.2)

## 2021-12-09 LAB — BASIC METABOLIC PANEL
Anion gap: 7 (ref 5–15)
BUN: 9 mg/dL (ref 6–20)
CO2: 24 mmol/L (ref 22–32)
Calcium: 8.6 mg/dL — ABNORMAL LOW (ref 8.9–10.3)
Chloride: 108 mmol/L (ref 98–111)
Creatinine, Ser: 1.23 mg/dL — ABNORMAL HIGH (ref 0.44–1.00)
GFR, Estimated: 51 mL/min — ABNORMAL LOW (ref >60)
Glucose, Bld: 108 mg/dL — ABNORMAL HIGH (ref 70–99)
Potassium: 3.6 mmol/L (ref 3.5–5.1)
Sodium: 139 mmol/L (ref 135–145)

## 2021-12-09 LAB — CEA: CEA: 1 ng/mL (ref 0.0–4.7)

## 2021-12-09 LAB — CA 125: Cancer Antigen (CA) 125: 16.2 U/mL (ref 0.0–38.1)

## 2021-12-09 MED ORDER — CIPROFLOXACIN HCL 500 MG PO TABS: 500.0000 mg | ORAL_TABLET | Freq: Two times a day (BID) | ORAL | 0 refills | Status: AC

## 2021-12-09 MED ORDER — ALPRAZOLAM 0.5 MG PO TABS
1.0000 mg | ORAL_TABLET | Freq: Once | ORAL | Status: AC
Start: 2021-12-09 — End: 2021-12-09
  Administered 2021-12-09: 1 mg via ORAL
  Filled 2021-12-09: qty 2

## 2021-12-09 MED ORDER — FLUCONAZOLE 150 MG PO TABS
150.0000 mg | ORAL_TABLET | ORAL | 0 refills | Status: DC | PRN
Start: 2021-12-09 — End: 2021-12-23

## 2021-12-09 MED ORDER — GADOBUTROL 1 MMOL/ML IV SOLN
7.0000 mL | Freq: Once | INTRAVENOUS | Status: AC | PRN
  Administered 2021-12-09: 7 mL via INTRAVENOUS

## 2021-12-09 MED ORDER — METRONIDAZOLE 500 MG PO TABS: 500.0000 mg | ORAL_TABLET | Freq: Two times a day (BID) | ORAL | 0 refills | Status: AC

## 2021-12-09 NOTE — Progress Notes (Signed)
Discharge instructions and prescriptions given to pt. Discussed signs and symptoms to report to the MD, upcoming appointments, and meds. Pt verbalizes understanding and has no questions or concerns at this time. Pt discharged home from hospital in stable condition. 

## 2021-12-09 NOTE — Discharge Summary (Signed)
Physician Discharge Summary  ?Patient ID: ?Andrea Robertson ?MRN: [id number] ?DOB/AGE: 1960/12/24 61 y.o. ? ?Admit date: 12/06/2021 ?Discharge date: 12/09/2021 ? ?Admission Diagnoses:  Abdominal abscess/ TOA ? ?Discharge Diagnoses:  ?Principal Problem: ?  TOA (tubo-ovarian abscess) ? ? ?Discharged Condition: good ? ?Hospital Course: Pt admitted with abdominal pain. During evaluation, pt was seen to have an 8 cm pelvic mass.  Once admitted the patient never had any significant pain or nausea/vomiting.  She was started on IV antibiotics including mefoxin, flagyl, and doxy.  WBC count decreased quickly and pt continued to do well.  Due to proximity to patient's pre-existing diverticulosis, surgical consult was ordered.  Fluid collection was not thought to be associated with the diverticulosis.  MRI was obtained which represented more of bilateral hydrosalpinx and not an infectious process.  Gyn Onc, infectious disease and interventional radiology were all curb sided for treatment options.  Tumor markers were negative.  Gyn onc suggested no surgical intervention, only continued antibiotics with reassessment in 6-8 weeks.  Infectious disease suggested that due to MRI findings, prolonged therapy was not necessary.  As patient remained afebrile and asymptomatic she was discharged home with oral antibiotics.  She will need follow up ultrasound or CT scan in 8 weeks. ? ?Consults: general surgery and curbside consultations with Interventional radiology, gyn oncology and infectious disease ? ?Significant Diagnostic Studies: radiology: MRI: pelvis and CT scan: pelvis ? ?Treatments: antibiotics: metronidazole and mefoxin and flagyl ? ?Discharge Exam: ?Blood pressure 125/75, pulse 78, temperature 98.6 ?F (37 ?C), temperature source Oral, resp. rate 18, height 5\' 2"  (1.575 m), weight 85.1 kg, SpO2 97 %. ?General appearance: alert, cooperative, no distress, and mildly obese ?Head: Normocephalic, without obvious abnormality,  atraumatic ?Resp: clear to auscultation bilaterally ?Cardio: regular rate and rhythm ?Extremities: Homans sign is negative, no sign of DVT and no edema, redness or tenderness in the calves or thighs ? ?Disposition: Discharge disposition: 01-Home or Self Care ? ? ? ? ? ? ?Discharge Instructions   ? ? Call MD for:  difficulty breathing, headache or visual disturbances   Complete by: As directed ?  ? Call MD for:  persistant dizziness or light-headedness   Complete by: As directed ?  ? Call MD for:  persistant nausea and vomiting   Complete by: As directed ?  ? Call MD for:  severe uncontrolled pain   Complete by: As directed ?  ? Call MD for:  temperature >100.4   Complete by: As directed ?  ? Diet Carb Modified   Complete by: As directed ?  ? Increase activity slowly   Complete by: As directed ?  ? ?  ? ?Allergies as of 12/09/2021   ? ?   Reactions  ? Dilaudid [hydromorphone Hcl] Hives  ? Can tolerate if premedicated with benadryl  ? Erythromycin Nausea And Vomiting  ? Percocet [oxycodone-acetaminophen] Itching, Nausea And Vomiting  ? Can tolerate with premedication of diphenhydramine and/or nausea medication  ? Zithromax [azithromycin] Nausea And Vomiting  ? Penicillins Hives, Nausea And Vomiting  ? Codeine Nausea Only  ? Oxycodone Other (See Comments)  ? Other reaction(s): nausea, epigastric pain  ? Cefprozil Rash  ? rash but takes Ceftin  ? Other Rash  ? Gel on Ekg pads   ? ?  ? ?  ?Medication List  ?  ? ?STOP taking these medications   ? ?docusate sodium 100 MG capsule ?Commonly known as: COLACE ?  ?meclizine 25 MG tablet ?Commonly known as: ANTIVERT ?  ? ?  ? ?  TAKE these medications   ? ?Accu-Chek FastClix Lancets Misc ?USE TO TEST YOUR BLOOD SUGAR TWICE A DAY DX E11.21 ?  ?amLODipine 10 MG tablet ?Commonly known as: NORVASC ?Take 0.5 tablets (5 mg total) by mouth daily. ?What changed: when to take this ?  ?aspirin 81 MG EC tablet ?Take 1 tablet (81 mg total) by mouth daily. ?What changed: when to take this ?   ?atorvastatin 80 MG tablet ?Commonly known as: LIPITOR ?Take 1 tablet (80 mg total) by mouth daily at 6 PM. ?  ?buPROPion 300 MG 24 hr tablet ?Commonly known as: WELLBUTRIN XL ?Take 300 mg by mouth every morning. ?  ?carvedilol 25 MG tablet ?Commonly known as: COREG ?Take 1.5 tablets (37.5 mg total) by mouth 2 (two) times daily. ?  ?ciprofloxacin 500 MG tablet ?Commonly known as: Cipro ?Take 1 tablet (500 mg total) by mouth 2 (two) times daily for 5 days. ?  ?dapagliflozin propanediol 10 MG Tabs tablet ?Commonly known as: [place] ?Take 1 tablet (10 mg total) by mouth daily before breakfast. ?  ?Entresto 49-51 MG ?Generic drug: sacubitril-valsartan ?Take 1 tablet by mouth 2 (two) times daily. ?  ?escitalopram 10 MG tablet ?Commonly known as: LEXAPRO ?Take 10 mg by mouth every morning. ?  ?ezetimibe 10 MG tablet ?Commonly known as: ZETIA ?Take 1 tablet (10 mg total) by mouth daily. ?What changed: when to take this ?  ?fexofenadine 180 MG tablet ?Commonly known as: ALLEGRA ?Take 180 mg by mouth daily as needed for allergies (seasonal allergies). ?  ?fluconazole 150 MG tablet ?Commonly known as: DIFLUCAN ?Take 1 tablet (150 mg total) by mouth every three (3) days as needed (vaginal irritation/itching). ?  ?fluticasone 50 MCG/ACT nasal spray ?Commonly known as: FLONASE ?Place 1 spray into both nostrils daily as needed (seasonal allergies). ?  ?metroNIDAZOLE 500 MG tablet ?Commonly known as: FLAGYL ?Take 1 tablet (500 mg total) by mouth 2 (two) times daily for 7 days. ?  ?multivitamin with minerals Tabs tablet ?Take 1 tablet by mouth every morning. ?  ?nitroGLYCERIN 0.4 MG SL tablet ?Commonly known as: NITROSTAT ?Place 1 tablet (0.4 mg total) under the tongue every 5 (five) minutes x 3 doses as needed for chest pain. ?  ? ?  ? ? Follow-up Information   ? ? Surgery, [place] Follow up.   ?Specialty: General Surgery ?Why: As needed ?Contact information: ?[address] ?[address] ?[place]  [address] ?[phone] ? ? ?  ?  ? ? Center for [employer] at [facility]. Schedule an appointment as soon as possible for a visit in 2 week(s).   ?Specialty: Obstetrics and Gynecology ?Why: hospital follow up Elgie Congo or [facility] ?Contact information: ?[place] ?[place] [phone] ?[phone] ? ?  ?  ? ?  ?  ? ?  ? ? ?Signed: ?Griffin Basil ?12/09/2021, 3:37 PM ? ? ?

## 2021-12-09 NOTE — Progress Notes (Addendum)
Interventional Radiology Brief Note: ? ?IR consulted for possible aspiration vs. Drainage of pelvic fluid collection thought to represent a TOA. Patient does have a history of hysterectomy in 2012, however appears her ovaries and fallopian tubes were left intact.   ? ?CT imaging showed: ?1. Complex fluid collection within the deep central pelvis measuring ?approximately 8 x 5 x 8 cm with internal septations and areas of ?peripheral nodularity. Collection is intimately associated with the ?bilateral adnexal regions. Findings are concerning for abscess, ?possibly tubo-ovarian in origin. Collection also abuts the appendix ?and sigmoid colon and could potentially be arising from a bowel ?source. ?2. Numerous colonic diverticula. Mildly thickened segment of sigmoid ?colon adjacent to the pelvic fluid collection, which may represent ?reactive changes from the adjacent pelvic fluid collection versus ?inactive colitis/diverticulitis. ? ?Due to complexity by CT, Dr. Deanne Coffer recommended MR Pelvis vs. US Pelvis to further characterize.  Also recommended possible input from GynOnc.  ? ?MR Pelvis performed overnight and reviewed by Dr. Juliette Alcide this AM who notes that fluid appears simple in nature.  Could consider proceeding with US Pelvis, however no clear indication for aspiration/drainage at this time.  Dr. Donavan Foil notified by secure chat.  ? ?Loyce Dys, MS RD PA-C ? ? ?

## 2021-12-09 NOTE — Progress Notes (Signed)
? ?Subjective/Chief Complaint: ?unchanged ? ? ?Objective: ?Vital signs in last 24 hours: ?Temp:  [97.4 ?F (36.3 ?C)-98.7 ?F (37.1 ?C)] 97.4 ?F (36.3 ?C) (04/20 3710) ?Pulse Rate:  [71-80] 80 (04/20 0834) ?Resp:  [17-18] 18 (04/20 0834) ?BP: (101-124)/(56-78) 110/78 (04/20 6269) ?SpO2:  [96 %-99 %] 99 % (04/20 0834) ?Last BM Date : 12/05/21 ? ?Intake/Output from previous day: ?No intake/output data recorded. ?Intake/Output this shift: ?No intake/output data recorded. ? ?GI: minimally tender ? ?Lab Results:  ?Recent Labs  ?  12/08/21 ?1106 12/09/21 ?0450  ?WBC 10.9* 10.7*  ?HGB 13.0 13.0  ?HCT 38.7 37.7  ?PLT 255 269  ? ?BMET ?Recent Labs  ?  12/06/21 ?1654 12/09/21 ?0450  ?NA 137 139  ?K 3.5 3.6  ?CL 102 108  ?CO2 26 24  ?GLUCOSE 148* 108*  ?BUN 13 9  ?CREATININE 1.29* 1.23*  ?CALCIUM 9.5 8.6*  ? ?PT/INR ?No results for input(s): LABPROT, INR in the last 72 hours. ?ABG ?No results for input(s): PHART, HCO3 in the last 72 hours. ? ?Invalid input(s): PCO2, PO2 ? ?Studies/Results: ?MR PELVIS W WO CONTRAST ? ?Result Date: 12/09/2021 ?CLINICAL DATA:  Evaluate cystic pelvic lesions seen on recent CT scan. EXAM: MRI PELVIS WITHOUT AND WITH CONTRAST TECHNIQUE: Multiplanar multisequence MR imaging of the pelvis was performed both before and after administration of intravenous contrast. CONTRAST:  61mL GADAVIST GADOBUTROL 1 MMOL/ML IV SOLN COMPARISON:  CT scan, 12/06/2021 FINDINGS: Urinary Tract: The bladder is unremarkable. No bladder mass, calculi or asymmetric wall thickening. Bowel: The rectum, sigmoid colon and visualized small bowel loops are unremarkable. Cecum and appendix are normal. Moderate diverticulosis without findings for acute diverticulitis. Vascular/Lymphatic: The major vascular structures are normal. No pelvic lymphadenopathy. Reproductive: The uterus is surgically absent. Both ovaries are still present. They appeared normal on a prior CT scan from 2017. As demonstrated on the CT scan and there is a complex  multi septated appearing cystic lesion in the pelvis. Rim enhancement is demonstrated as was seen on the CT scan this appears to be associated with both adnexa and I think this is most likely a complex bilateral hydrosalpinx (if the patient had a hysterectomy remotely and the fallopian tubes were not removed). There are areas of normal appearing ovarian tissue bilaterally. There are no inflammatory changes around this area to suggest this is an abscess. No associated free fluid. Cystic ovarian neoplasm is also a consideration. Other:  No free pelvic fluid collections or inguinal adenopathy. Musculoskeletal: The bony structures are unremarkable. There is a focal central disc protrusion noted at L5-S1. The hip and pelvic musculature are unremarkable. IMPRESSION: 1. Complex multi-septated appearing cystic lesion in the pelvis is most likely complex bilateral hydrosalpinx. Recommend correlation with date of prior hysterectomy and whether or not the fallopian tubes were removed. Both ovaries are still present. Cystic ovarian neoplasm is also a consideration. I think it is unlikely this is an abscess. 2. No pelvic lymphadenopathy. 3. Small focal central disc protrusion at L5-S1. Electronically Signed   By: Rudie Meyer M.D.   On: 12/09/2021 08:37   ? ?Anti-infectives: ?Anti-infectives (From admission, onward)  ? ? Start     Dose/Rate Route Frequency Ordered Stop  ? 12/08/21 1819  fluconazole (DIFLUCAN) tablet 150 mg       ? 150 mg Oral Every 3 DAYS PRN 12/08/21 1819    ? 12/07/21 1400  metroNIDAZOLE (FLAGYL) IVPB 500 mg       ? 500 mg ?100 mL/hr over 60 Minutes Intravenous  Every 12 hours 12/07/21 0811    ? 12/07/21 0115  cefOXitin (MEFOXIN) 2 g in sodium chloride 0.9 % 100 mL IVPB       ? 2 g ?200 mL/hr over 30 Minutes Intravenous Every 6 hours 12/07/21 0103 12/13/21 2359  ? 12/07/21 0115  cefOXitin (MEFOXIN) 2 g in sodium chloride 0.9 % 100 mL IVPB  Status:  Discontinued       ? 2 g ?200 mL/hr over 30 Minutes  Intravenous Every 6 hours 12/07/21 0113 12/07/21 0217  ? 12/07/21 0115  doxycycline (VIBRAMYCIN) 100 mg in sodium chloride 0.9 % 250 mL IVPB  Status:  Discontinued       ? 100 mg ?125 mL/hr over 120 Minutes Intravenous Every 12 hours 12/07/21 0113 12/07/21 0217  ? 12/07/21 0103  doxycycline (VIBRAMYCIN) 100 mg in sodium chloride 0.9 % 250 mL IVPB       ? 100 mg ?125 mL/hr over 120 Minutes Intravenous Every 12 hours 12/07/21 0103 12/13/21 2159  ? 12/06/21 2330  cefTRIAXone (ROCEPHIN) 2 g in sodium chloride 0.9 % 100 mL IVPB       ? 2 g ?200 mL/hr over 30 Minutes Intravenous  Once 12/06/21 2328 12/07/21 0150  ? 12/06/21 2330  metroNIDAZOLE (FLAGYL) IVPB 500 mg       ? 500 mg ?100 mL/hr over 60 Minutes Intravenous  Once 12/06/21 2328 12/07/21 0219  ? ?  ? ? ?Assessment/Plan: ?Complex pelvic fluid collection concerning for tubo-ovarian abscess ?Diverticulosis with history of diverticulitis and perforation status post partial colectomy primary anastomosis ?-dont think this is diverticular, if think it is abscess would aspirate or drain ?-if concerning from gyn standpoint for mass then would discuss gyn onc ?-will sign off ? ?I reviewed Consultant gyn notes, last 24 h vitals and pain scores, last 48 h intake and output, last 24 h labs and trends, and last 24 h imaging results. ? ?This care required straight-forward level of medical decision making.  ? ? ? ?Emelia Loron ?12/09/2021 ? ?

## 2021-12-16 ENCOUNTER — Ambulatory Visit: Payer: BC Managed Care – PPO

## 2021-12-16 ENCOUNTER — Ambulatory Visit: Payer: BC Managed Care – PPO | Admitting: Cardiology

## 2021-12-16 DIAGNOSIS — I5042 Chronic combined systolic (congestive) and diastolic (congestive) heart failure: Secondary | ICD-10-CM

## 2021-12-16 DIAGNOSIS — I428 Other cardiomyopathies: Secondary | ICD-10-CM

## 2021-12-23 ENCOUNTER — Encounter: Payer: Self-pay | Admitting: Cardiology

## 2021-12-23 ENCOUNTER — Ambulatory Visit: Payer: BC Managed Care – PPO | Admitting: Cardiology

## 2021-12-23 VITALS — BP 134/82 | HR 67 | Temp 98.0°F | Resp 16 | Ht 63.0 in | Wt 185.0 lb

## 2021-12-23 DIAGNOSIS — E1122 Type 2 diabetes mellitus with diabetic chronic kidney disease: Secondary | ICD-10-CM

## 2021-12-23 DIAGNOSIS — I5042 Chronic combined systolic (congestive) and diastolic (congestive) heart failure: Secondary | ICD-10-CM | POA: Insufficient documentation

## 2021-12-23 DIAGNOSIS — I428 Other cardiomyopathies: Secondary | ICD-10-CM | POA: Insufficient documentation

## 2021-12-23 DIAGNOSIS — I25118 Atherosclerotic heart disease of native coronary artery with other forms of angina pectoris: Secondary | ICD-10-CM

## 2021-12-23 DIAGNOSIS — I10 Essential (primary) hypertension: Secondary | ICD-10-CM

## 2021-12-23 DIAGNOSIS — N1831 Chronic kidney disease, stage 3a: Secondary | ICD-10-CM

## 2021-12-23 NOTE — Progress Notes (Signed)
? ?Primary Physician/Referring:  Leeroy Cha, MD ? ?Patient ID: Andrea Robertson, female    DOB: 11-18-1960, 61 y.o.   MRN: [id number] ? ?Chief Complaint  ?Patient presents with  ? Follow-up  ? Results  ? Cardiomyopathy  ? ?HPI:   ? ?Andrea Robertson  is a 61 y.o. African-American female patient with history of NSTEMI and CAD, LAD stenting in 2020, diet-controlled diabetes mellitus, hypertension, hyperlipidemia, chronic stage III kidney disease probably due to hypertension and diabetes mellitus, due to new onset of cardiomyopathy with reduced LVEF by echocardiogram to 25 to 30% and abnormal nuclear stress test, underwent cardiac catheterization on 07/27/2021 revealing preserved cardiac output and index and no pulm hypertension, widely patent LAD stent with minimal disease in the other vessels and is on therapy for nonischemic cardiomyopathy. ? ?Patient presents for 8-week follow-up.  At last office visit increase carvedilol from 32m to 37.5 mg p.o. twice daily as heart rate remains >70 bpm.  Also last office visit reduced amlodipine from 10 mg to 5 mg given soft blood pressure.  Patient is tolerating medication changes without issue and heart rate is now <70 bpm.  Patient has had no recurrence of dyspnea or leg edema since last office visit.  She has lost approximately 2 pounds. ? ?Notably patient was admitted 12/07/2021 - 12/09/2021 with tubo-ovarian abscess for which she was treated with IV antibiotics ? ?Past Medical History:  ?Diagnosis Date  ? Anxiety   ? Arthritis   ? Chronic kidney disease   ? Coronary artery disease involving native coronary artery of native heart without angina pectoris 07/04/2019  ? Depression   ? Essential hypertension 10/30/2018  ? GERD (gastroesophageal reflux disease)   ? Hyperlipidemia 07/04/2019  ? Myocardial infarction ([facility]   ? Primary localized osteoarthritis of left knee   ? S/P total knee arthroplasty, right   ? ?Family History  ?Problem Relation Age of Onset  ?  Heart disease Mother   ? Multiple myeloma Mother   ? Heart attack Father 557 ? CAD Father   ? Hypertension Sister   ? Hypertension Sister   ? Hypertension Sister   ? Heart failure Brother   ?  ?Social History  ? ?Tobacco Use  ? Smoking status: Never  ? Smokeless tobacco: Never  ?Substance Use Topics  ? Alcohol use: Not Currently  ?  Comment: rare  ? ?Marital Status: Married  ?ROS  ?Review of Systems  ?Cardiovascular:  Positive for leg swelling (occasional). Negative for chest pain and dyspnea on exertion (improved).  ?Objective  ?Blood pressure 134/82, pulse 67, temperature 98 ?F (36.7 ?C), resp. rate 16, height 5' 3" (1.6 m), weight 185 lb (83.9 kg), SpO2 97 %. Body mass index is 32.77 kg/m?.  ? ?  12/23/2021  ?  9:21 AM 12/09/2021  ? 12:00 PM 12/09/2021  ?  8:34 AM  ?Vitals with BMI  ?Height 5' 3"    ?Weight 185 lbs    ?BMI 32.78    ?Systolic [id number] ?Diastolic 82 75 78  ?Pulse 67 78 80  ?  ? Physical Exam ?Vitals reviewed.  ?Constitutional:   ?   Appearance: She is obese.  ?Neck:  ?   Vascular: No carotid bruit or JVD.  ?Cardiovascular:  ?   Rate and Rhythm: Normal rate and regular rhythm.  ?   Pulses: Intact distal pulses.  ?   Heart sounds: Normal heart sounds. No murmur heard. ?  No gallop.  ?Pulmonary:  ?  Effort: Pulmonary effort is normal.  ?   Breath sounds: Normal breath sounds.  ?Musculoskeletal:  ?   Right lower leg: No edema.  ?   Left lower leg: No edema.  ?  ?Laboratory examination:  ? ?Recent Labs  ?  07/27/21 ?0724 07/27/21 ?1004 11/01/21 ?1533 12/06/21 ?1654 12/09/21 ?0450  ?NA 137   < > 144 137 139  ?K 3.5   < > 3.8 3.5 3.6  ?CL 101   < > 98 102 108  ?CO2 27   < > _0 ?GLUCOSE 118*   < > 112* 148* 108*  ?BUN 13   < > _1 ?CREATININE 1.28*   < > 1.16* 1.29* 1.23*  ?CALCIUM 9.1   < > 9.7 9.5 8.6*  ?GFRNONAA 49*  --   --  48* 51*  ? < > = values in this interval not displayed.  ? ?estimated creatinine clearance is 51.2 mL/min (A) (by C-G formula based on SCr of 1.23 mg/dL (H)).  ? ?   Latest Ref Rng & Units 12/09/2021  ?  4:50 AM 12/06/2021  ?  4:54 PM 11/01/2021  ?  3:33 PM  ?CMP  ?Glucose 70 - 99 mg/dL 108   148   112    ?BUN 6 - 20 mg/dL _2 ?Creatinine 0.44 - 1.00 mg/dL 1.23   1.29   1.16    ?Sodium 135 - 145 mmol/L 139   137   144    ?Potassium 3.5 - 5.1 mmol/L 3.6   3.5   3.8    ?Chloride 98 - 111 mmol/L 108   102   98    ?CO2 22 - 32 mmol/L _3 ?Calcium 8.9 - 10.3 mg/dL 8.6   9.5   9.7    ?Total Protein 6.5 - 8.1 g/dL  7.8     ?Total Bilirubin 0.3 - 1.2 mg/dL  0.9     ?Alkaline Phos 38 - 126 U/L  74     ?AST 15 - 41 U/L  15     ?ALT 0 - 44 U/L  12     ? ? ?  Latest Ref Rng & Units 12/09/2021  ?  4:50 AM 12/08/2021  ? 11:06 AM 12/06/2021  ?  4:54 PM  ?CBC  ?WBC 4.0 - 10.5 K/uL 10.7   10.9   13.8    ?Hemoglobin 12.0 - 15.0 g/dL 13.0   13.0   13.1    ?Hematocrit 36.0 - 46.0 % 37.7   38.7   39.2    ?Platelets 150 - 400 K/uL 269   255   265    ? ? ?Lipid Panel ?Recent Labs  ?  06/09/21 ?1051 08/19/21 ?1010  ?CHOL 160 135  ?TRIG 91 78  ?LDLCALC 83 57  ?HDL 60 63  ? ?HEMOGLOBIN A1C ?Lab Results  ?Component Value Date  ? HGBA1C 6.8 (H) 11/11/2020  ? MPG 148.46 11/11/2020  ? ?TSH ?No results for input(s): TSH in the last 8760 hours. ? ? ?Allergies  ? ?Allergies  ?Allergen Reactions  ? Dilaudid [Hydromorphone Hcl] Hives  ?  Can tolerate if premedicated with benadryl  ? Erythromycin Nausea And Vomiting  ? Percocet [Oxycodone-Acetaminophen] Itching and Nausea And Vomiting  ?  Can tolerate with premedication of diphenhydramine and/or nausea medication  ? Zithromax [Azithromycin] Nausea And Vomiting  ? Penicillins Hives  and Nausea And Vomiting  ? Codeine Nausea Only  ? Oxycodone Other (See Comments)  ?  Other reaction(s): nausea, epigastric pain  ? Cefprozil Rash  ?  rash but takes Ceftin  ? Other Rash  ?  Gel on Ekg pads   ?  ?  ?Medication list after today's encounter  ? ? ?Current Outpatient Medications:  ?  Accu-Chek FastClix Lancets MISC, USE TO TEST YOUR BLOOD SUGAR TWICE A DAY DX  E11.21, Disp: , Rfl:  ?  amLODipine (NORVASC) 10 MG tablet, Take 0.5 tablets (5 mg total) by mouth daily. (Patient taking differently: Take 5 mg by mouth every morning.), Disp: 90 tablet, Rfl: 3 ?  aspirin EC 81 MG EC tablet, Take 1 tablet (81 mg total) by mouth daily. (Patient taking differently: Take 81 mg by mouth every morning.), Disp: 90 tablet, Rfl: 3 ?  atorvastatin (LIPITOR) 80 MG tablet, Take 1 tablet (80 mg total) by mouth daily at 6 PM., Disp: 90 tablet, Rfl: 3 ?  buPROPion (WELLBUTRIN XL) 300 MG 24 hr tablet, Take 300 mg by mouth every morning., Disp: , Rfl:  ?  carvedilol (COREG) 25 MG tablet, Take 1.5 tablets (37.5 mg total) by mouth 2 (two) times daily., Disp: 270 tablet, Rfl: 3 ?  dapagliflozin propanediol (FARXIGA) 10 MG TABS tablet, Take 1 tablet (10 mg total) by mouth daily before breakfast., Disp: 90 tablet, Rfl: 3 ?  escitalopram (LEXAPRO) 10 MG tablet, Take 10 mg by mouth every morning., Disp: , Rfl:  ?  ezetimibe (ZETIA) 10 MG tablet, Take 1 tablet (10 mg total) by mouth daily. (Patient taking differently: Take 10 mg by mouth every morning.), Disp: 90 tablet, Rfl: 3 ?  fexofenadine (ALLEGRA) 180 MG tablet, Take 180 mg by mouth daily as needed for allergies (seasonal allergies)., Disp: , Rfl:  ?  fluticasone (FLONASE) 50 MCG/ACT nasal spray, Place 1 spray into both nostrils daily as needed (seasonal allergies)., Disp: , Rfl:  ?  Multiple Vitamin (MULTIVITAMIN WITH MINERALS) TABS tablet, Take 1 tablet by mouth every morning., Disp: , Rfl:  ?  nitroGLYCERIN (NITROSTAT) 0.4 MG SL tablet, Place 1 tablet (0.4 mg total) under the tongue every 5 (five) minutes x 3 doses as needed for chest pain., Disp: 25 tablet, Rfl: 1 ?  sacubitril-valsartan (ENTRESTO) 49-51 MG, Take 1 tablet by mouth 2 (two) times daily., Disp: 60 tablet, Rfl: 2  ?Radiology:  ? ?No results found. ? ?Cardiac Studies:  ? ?PCV MYOCARDIAL PERFUSION WO LEXISCAN 06/21/2021 ?Lexiscan/modified Bruce nuclear stress test performed using  1-day protocol. Patient reached 7 METS, 64% MPHR. In addition, stress EKG showed sinus tachycardia, inferolateral infarct, no acute ischemic changes. ?SPECT Images showed medium sized, severe intensity, predo

## 2022-02-02 LAB — HM DIABETES EYE EXAM

## 2022-02-07 ENCOUNTER — Ambulatory Visit (HOSPITAL_BASED_OUTPATIENT_CLINIC_OR_DEPARTMENT_OTHER): Payer: BC Managed Care – PPO | Admitting: Nurse Practitioner

## 2022-03-04 ENCOUNTER — Encounter (HOSPITAL_BASED_OUTPATIENT_CLINIC_OR_DEPARTMENT_OTHER): Payer: Self-pay | Admitting: Nurse Practitioner

## 2022-03-04 ENCOUNTER — Ambulatory Visit (HOSPITAL_BASED_OUTPATIENT_CLINIC_OR_DEPARTMENT_OTHER): Payer: BC Managed Care – PPO | Admitting: Nurse Practitioner

## 2022-03-04 ENCOUNTER — Encounter: Payer: Self-pay | Admitting: Nurse Practitioner

## 2022-03-04 VITALS — BP 118/78 | HR 70 | Temp 98.3°F | Resp 16 | Ht 67.5 in | Wt 189.4 lb

## 2022-03-04 DIAGNOSIS — I25118 Atherosclerotic heart disease of native coronary artery with other forms of angina pectoris: Secondary | ICD-10-CM

## 2022-03-04 DIAGNOSIS — Z90710 Acquired absence of both cervix and uterus: Secondary | ICD-10-CM | POA: Insufficient documentation

## 2022-03-04 DIAGNOSIS — N183 Chronic kidney disease, stage 3 unspecified: Secondary | ICD-10-CM

## 2022-03-04 DIAGNOSIS — Z0000 Encounter for general adult medical examination without abnormal findings: Secondary | ICD-10-CM | POA: Diagnosis not present

## 2022-03-04 DIAGNOSIS — Z23 Encounter for immunization: Secondary | ICD-10-CM | POA: Diagnosis not present

## 2022-03-04 DIAGNOSIS — F329 Major depressive disorder, single episode, unspecified: Secondary | ICD-10-CM

## 2022-03-04 DIAGNOSIS — M1990 Unspecified osteoarthritis, unspecified site: Secondary | ICD-10-CM | POA: Insufficient documentation

## 2022-03-04 DIAGNOSIS — I5042 Chronic combined systolic (congestive) and diastolic (congestive) heart failure: Secondary | ICD-10-CM

## 2022-03-04 DIAGNOSIS — E7800 Pure hypercholesterolemia, unspecified: Secondary | ICD-10-CM

## 2022-03-04 DIAGNOSIS — I129 Hypertensive chronic kidney disease with stage 1 through stage 4 chronic kidney disease, or unspecified chronic kidney disease: Secondary | ICD-10-CM

## 2022-03-04 DIAGNOSIS — E1122 Type 2 diabetes mellitus with diabetic chronic kidney disease: Secondary | ICD-10-CM | POA: Diagnosis not present

## 2022-03-04 DIAGNOSIS — N1831 Chronic kidney disease, stage 3a: Secondary | ICD-10-CM

## 2022-03-04 LAB — HM DIABETES FOOT EXAM

## 2022-03-04 MED ORDER — ESCITALOPRAM OXALATE 20 MG PO TABS: 20.0000 mg | ORAL_TABLET | Freq: Every morning | ORAL | 1 refills | Status: DC

## 2022-03-04 MED ORDER — ZOSTER VAC RECOMB ADJUVANTED 50 MCG/0.5ML IM SUSR: 0.5000 mL | Freq: Once | INTRAMUSCULAR | 1 refills | Status: AC

## 2022-03-04 NOTE — Patient Instructions (Signed)
Thank you for choosing [place] [facility] for your Primary Care needs. I am excited for the opportunity to partner with you to meet your health care goals. It was a pleasure meeting you today!  Recommendations from today's visit: I have sent in the higher dose of escitalopram for you to the pharmacy We will plan to get your physical exam in the next few weeks and we can see how you are doing on the medication I have put the lab orders in for your physical exam you can come and have this done a few days before the physical, be sure you are fasting with those And I sent the referral to counseling they will contact you hopefully in the next week or so you should hear from them If it anytime you are feeling worse things are not getting any better you just need somebody to talk to I want you to reach out okay  Information on diet, exercise, and health maintenance recommendations are listed below. This is information to help you be sure you are on track for optimal health and monitoring.   Please look over this and let us know if you have any questions or if you have completed any of the health maintenance outside of [place] so that we can be sure your records are up to date.  ___________________________________________________________ About Me: I am an Adult-Geriatric Nurse Practitioner with a background in caring for patients for more than 20 years with a strong intensive care background. I provide primary care and sports medicine services to patients age 40 and older within this office. My education had a strong focus on caring for the older adult population, which I am passionate about. I am also the director of the APP Fellowship with [facility].   My desire is to provide you with the best service through preventive medicine and supportive care. I consider you a part of the medical team and value your input. I work diligently to ensure that you are heard and your needs  are met in a safe and effective manner. I want you to feel comfortable with me as your provider and want you to know that your health concerns are important to me.  For your information, our office hours are: Monday, Tuesday, and Thursday 8:00 AM - 5:00 PM Wednesday and Friday 8:00 AM - 12:00 PM.   In my time away from the office I am teaching new APP's within the system and am unavailable, but my partner, Dr. Burnard Bunting is in the office for emergent needs.   If you have questions or concerns, please call our office at [phone] or send [place] a MyChart message and we will respond as quickly as possible.  ____________________________________________________________ MyChart:  For all urgent or time sensitive needs we ask that you please call the office to avoid delays. Our number is [phone]. MyChart is not constantly monitored and due to the large volume of messages a day, replies may take up to 72 business hours.  MyChart Policy: MyChart allows for you to see your visit notes, after visit summary, provider recommendations, lab and tests results, make an appointment, request refills, and contact your provider or the office for non-urgent questions or concerns. Providers are seeing patients during normal business hours and do not have built in time to review MyChart messages.  We ask that you allow a minimum of 3 business days for responses to [employer]. For this reason, please do not send urgent  requests through [place]. Please call the office at [phone]. New and ongoing conditions may require a visit. We have virtual and in person visit available for your convenience.  Complex MyChart concerns may require a visit. Your provider may request you schedule a virtual or in person visit to ensure we are providing the best care possible. MyChart messages sent after 11:00 AM on Friday will not be received by the provider until Monday morning.    Lab and Test Results: You will receive your  lab and test results on MyChart as soon as they are completed and results have been sent by the lab or testing facility. Due to this service, you will receive your results BEFORE your provider.  I review lab and tests results each morning prior to seeing patients. Some results require collaboration with other providers to ensure you are receiving the most appropriate care. For this reason, we ask that you please allow a minimum of 3-5 business days from the time the ALL results have been received for your provider to receive and review lab and test results and contact you about these.  Most lab and test result comments from the provider will be sent through [place]. Your provider may recommend changes to the plan of care, follow-up visits, repeat testing, ask questions, or request an office visit to discuss these results. You may reply directly to this message or call the office at [phone] to provide information for the provider or set up an appointment. In some instances, you will be called with test results and recommendations. Please let us know if this is preferred and we will make note of this in your chart to provide this for you.    If you have not heard a response to your lab or test results in 5 business days from all results returning to [place], please call the office to let us know. We ask that you please avoid calling prior to this time unless there is an emergent concern. Due to high call volumes, this can delay the resulting process.  After Hours: For all non-emergency after hours needs, please call the office at [phone] and select the option to reach the on-call provider service. On-call services are shared between multiple [place] offices and therefore it will not be possible to speak directly with your provider. On-call providers may provide medical advice and recommendations, but are unable to provide refills for maintenance medications.  For all emergency or urgent medical  needs after normal business hours, we recommend that you seek care at the closest Urgent Care or Emergency Department to ensure appropriate treatment in a timely manner.  [facility] has a 24 hour emergency room located on the ground floor for your convenience.   Urgent Concerns During the Business Day Providers are seeing patients from 8AM to [place] with a busy schedule and are most often not able to respond to non-urgent calls until the end of the day or the next business day. If you should have URGENT concerns during the day, please call and speak to the nurse or schedule a same day appointment so that we can address your concern without delay.   Thank you, again, for choosing me as your health care partner. I appreciate your trust and look forward to learning more about you.   Worthy Keeler, DNP, AGNP-c ___________________________________________________________  Health Maintenance Recommendations Screening Testing Mammogram Every 1 -2 years based on history and risk factors Starting at age 11 Pap Smear Ages 21-39  every 3 years Ages 62-65 every 5 years with HPV testing More frequent testing may be required based on results and history Colon Cancer Screening Every 1-10 years based on test performed, risk factors, and history Starting at age 72 Bone Density Screening Every 2-10 years based on history Starting at age 63 for women Recommendations for men differ based on medication usage, history, and risk factors AAA Screening One time ultrasound Men 60-1 years old who have every smoked Lung Cancer Screening Low Dose Lung CT every 12 months Age 25-80 years with a 30 pack-year smoking history who still smoke or who have quit within the last 15 years  Screening Labs Routine  Labs: Complete Blood Count (CBC), Complete Metabolic Panel (CMP), Cholesterol (Lipid Panel) Every 6-12 months based on history and medications May be recommended more frequently based on  current conditions or previous results Hemoglobin A1c Lab Every 3-12 months based on history and previous results Starting at age 63 or earlier with diagnosis of diabetes, high cholesterol, BMI >26, and/or risk factors Frequent monitoring for patients with diabetes to ensure blood sugar control Thyroid Panel (TSH w/ T3 & T4) Every 6 months based on history, symptoms, and risk factors May be repeated more often if on medication HIV One time testing for all patients 17 and older May be repeated more frequently for patients with increased risk factors or exposure Hepatitis C One time testing for all patients 16 and older May be repeated more frequently for patients with increased risk factors or exposure Gonorrhea, Chlamydia Every 12 months for all sexually active persons 13-24 years Additional monitoring may be recommended for those who are considered high risk or who have symptoms PSA Men 95-47 years old with risk factors Additional screening may be recommended from age 65-69 based on risk factors, symptoms, and history  Vaccine Recommendations Tetanus Booster All adults every 10 years Flu Vaccine All patients 6 months and older every year COVID Vaccine All patients 12 years and older Initial dosing with booster May recommend additional booster based on age and health history HPV Vaccine 2 doses all patients age 66-26 Dosing may be considered for patients over 26 Shingles Vaccine (Shingrix) 2 doses all adults 62 years and older Pneumonia (Pneumovax 23) All adults 37 years and older May recommend earlier dosing based on health history Pneumonia (Prevnar 60) All adults 19 years and older Dosed 1 year after Pneumovax 23  Additional Screening, Testing, and Vaccinations may be recommended on an individualized basis based on family history, health history, risk factors, and/or exposure.  __________________________________________________________  Diet Recommendations for All  Patients  I recommend that all patients maintain a diet low in saturated fats, carbohydrates, and cholesterol. While this can be challenging at first, it is not impossible and small changes can make big differences.  Things to try: Decreasing the amount of soda, sweet tea, and/or juice to one or less per day and replace with water While water is always the first choice, if you do not like water you may consider adding a water additive without sugar to improve the taste other sugar free drinks Replace potatoes with a brightly colored vegetable at dinner Use healthy oils, such as canola oil or olive oil, instead of butter or hard margarine Limit your bread intake to two pieces or less a day Replace regular pasta with low carb pasta options Bake, broil, or grill foods instead of frying Monitor portion sizes  Eat smaller, more frequent meals throughout the day instead of large meals  An important thing to remember is, if you love foods that are not great for your health, you don't have to give them up completely. Instead, allow these foods to be a reward when you have done well. Allowing yourself to still have special treats every once in a while is a nice way to tell yourself thank you for working hard to keep yourself healthy.   Also remember that every day is a new day. If you have a bad day and "fall off the wagon", you can still climb right back up and keep moving along on your journey!  We have resources available to help you!  Some websites that may be helpful include: www.http://carter.biz/  Www.VeryWellFit.com _____________________________________________________________  Activity Recommendations for All Patients  I recommend that all adults get at least 20 minutes of moderate physical activity that elevates your heart rate at least 5 days out of the week.  Some examples include: Walking or jogging at a pace that allows you to carry on a conversation Cycling (stationary bike or  outdoors) Water aerobics Yoga Weight lifting Dancing If physical limitations prevent you from putting stress on your joints, exercise in a pool or seated in a chair are excellent options.  Do determine your MAXIMUM heart rate for activity: YOUR AGE - 220 = MAX HeartRate   Remember! Do not push yourself too hard.  Start slowly and build up your pace, speed, weight, time in exercise, etc.  Allow your body to rest between exercise and get good sleep. You will need more water than normal when you are exerting yourself. Do not wait until you are thirsty to drink. Drink with a purpose of getting in at least 8, 8 ounce glasses of water a day plus more depending on how much you exercise and sweat.    If you begin to develop dizziness, chest pain, abdominal pain, jaw pain, shortness of breath, headache, vision changes, lightheadedness, or other concerning symptoms, stop the activity and allow your body to rest. If your symptoms are severe, seek emergency evaluation immediately. If your symptoms are concerning, but not severe, please let us know so that we can recommend further evaluation.

## 2022-03-04 NOTE — Progress Notes (Signed)
Orma Render, DNP, AGNP-c [facility] [address]  [place], [place] [address] [phone]  New patient visit   Patient: Andrea Robertson   DOB: Mar 07, 1961   61 y.o. Female  MRN: [id number] Visit Date: 03/04/2022  Patient Care Team: Lacheryl Niesen, Coralee Pesa, NP as PCP - General (Nurse Practitioner)  Today's Vitals   03/04/22 1003  BP: 118/78  Pulse: 70  Resp: 16  Temp: 98.3 F (36.8 C)  TempSrc: Oral  SpO2: 98%  Weight: 189 lb 6 oz (85.9 kg)  Height: 5' 7.5" (1.715 m)   Body mass index is 29.22 kg/m.   Today's healthcare provider: Orma Render, NP   Chief Complaint  Patient presents with   Establish Care    Pt here to Est care  States she is doing well    Subjective    Tabathia Knoche is a 61 y.o. female who presents today as a new patient to establish care.    Patient endorses the following concerns presently: Reactive Depression She endorses depressive symptoms associated with losing her mother last January and concerns over her sister who has multiple sclerosis and is fighting cancer who loves in [place]. She has been traveling a lot the last year to go to [place] to help her sister and was also helping to care for her mother.  She tells me the stress has taken it's toll on her and she is experiencing some mood changes. She denies SI/HI.  She has been on medication in the past and is currently on Wellbutrin 320m daily.  She tells me that she has gained about 10 pounds over the last six months due to stress.   She has a history of hypertension and is on medication for control.  She denies CP, ShOB, dizziness, palpitations, or HA.  She tolerates her medication well.   She tells me she also has a hx of MI and CVD as well as CKD.    History reviewed and reveals the following: Past Medical History:  Diagnosis Date   AKI (acute kidney injury) ([facility] 03/09/2021   Anxiety    Arthritis    Chronic kidney disease     Coronary artery disease involving native coronary artery of native heart without angina pectoris 07/04/2019   Depression    Essential hypertension 10/30/2018   Essential hypertension 10/30/2018   GERD (gastroesophageal reflux disease)    Hyperlipidemia 07/04/2019   Myocardial infarction ([facility]    Other allergy status, other than to drugs and biological substances 11/10/2020   Palpitations    Primary localized osteoarthritis of left knee    Primary localized osteoarthritis of left knee    Recurrent major depression in remission ([facility] 11/10/2020   S/P total knee arthroplasty, right    Shortness of breath 03/09/2021   TOA (tubo-ovarian abscess) 12/07/2021   Past Surgical History:  Procedure Laterality Date   ABDOMINAL HYSTERECTOMY     CHOLECYSTECTOMY     CORONARY STENT INTERVENTION N/A 07/03/2019   Procedure: CORONARY STENT INTERVENTION;  Surgeon: CSherren Mocha MD;  [facility];  Service: Cardiovascular;  Laterality: N/A;   LEFT HEART CATH AND CORONARY ANGIOGRAPHY N/A 07/03/2019   Procedure: LEFT HEART CATH AND CORONARY ANGIOGRAPHY;  Surgeon: CSherren Mocha MD;  [facility];  Service: Cardiovascular;  Laterality: N/A;   RIGHT/LEFT HEART CATH AND CORONARY ANGIOGRAPHY N/A 07/27/2021   Procedure: RIGHT/LEFT HEART CATH AND CORONARY ANGIOGRAPHY;  Surgeon: GAdrian Prows MD;  Location: [place] [facility];  Service: Cardiovascular;  Laterality: N/A;   TOTAL KNEE ARTHROPLASTY Right 10/13/2015   Procedure: RIGHT TOTAL KNEE ARTHROPLASTY;  Surgeon: Latanya Maudlin, MD;  Location: [facility];  Service: Orthopedics;  Laterality: Right;   TOTAL KNEE ARTHROPLASTY Left 11/23/2020   Procedure: TOTAL KNEE ARTHROPLASTY;  Surgeon: Elsie Saas, MD;  Location: [facility];  Service: Orthopedics;  Laterality: Left;   Family Status  Relation Name Status   Mother Altha Harm Deceased   Father  Deceased at age 59   Sister  39   Sister  Comptroller   Sister  [place]   Family History  Problem Relation Age of Onset   Heart disease Mother    Multiple myeloma Mother    Heart attack Father 61   CAD Father    Hypertension Sister    Hypertension Sister    Hypertension Sister    Heart failure Brother    Social History   Socioeconomic History   Marital status: Married    Spouse name: Not on file   Number of children: 2   Years of education: Not on file   Highest education level: Not on file  Occupational History   Not on file  Tobacco Use   Smoking status: Never   Smokeless tobacco: Never  Vaping Use   Vaping Use: Never used  Substance and Sexual Activity   Alcohol use: Not Currently    Comment: rare   Drug use: No   Sexual activity: Yes  Other Topics Concern   Not on file  Social History Narrative   Not on file   Social Determinants of Health   Financial Resource Strain: Not on file  Food Insecurity: No Food Insecurity (10/08/2019)   Hunger Vital Sign    Worried About Running Out of Food in the Last Year: Never true    Economy in the Last Year: Never true  Transportation Needs: No Transportation Needs (10/08/2019)   PRAPARE - Hydrologist (Medical): No    Lack of Transportation (Non-Medical): No  Physical Activity: Not on file  Stress: No Stress Concern Present (10/08/2019)   [place]    Feeling of Stress : Only a little  Social Connections: Socially Integrated (10/08/2019)   Social Connection and Isolation Panel [NHANES]    Frequency of Communication with Friends and Family: More than three times a week    Frequency of Social Gatherings with Friends and Family: Three times a week    Attends Religious Services: More than 4 times per year    Active Member of Clubs or Organizations: No    Attends Music therapist: More than 4 times per year    Marital Status: Married   Outpatient Medications Prior to Visit   Medication Sig   Accu-Chek FastClix Lancets MISC USE TO TEST YOUR BLOOD SUGAR TWICE A DAY DX E11.21   amLODipine (NORVASC) 10 MG tablet Take 0.5 tablets (5 mg total) by mouth daily. (Patient taking differently: Take 5 mg by mouth every morning.)   aspirin EC 81 MG EC tablet Take 1 tablet (81 mg total) by mouth daily. (Patient taking differently: Take 81 mg by mouth every morning.)   atorvastatin (LIPITOR) 80 MG tablet Take 1 tablet by mouth daily.   buPROPion (WELLBUTRIN XL) 300 MG 24 hr tablet Take 1 tablet by mouth daily.  carvedilol (COREG) 25 MG tablet Take 1.5 tablets (37.5 mg total) by mouth 2 (two) times daily.   [employer] GENTLE LAXATIVE 5 MG EC tablet Take 5 mg by mouth as directed.   dapagliflozin propanediol (FARXIGA) 10 MG TABS tablet Take 1 tablet (10 mg total) by mouth daily before breakfast.   ezetimibe (ZETIA) 10 MG tablet Take 1 tablet (10 mg total) by mouth daily. (Patient taking differently: Take 10 mg by mouth every morning.)   fexofenadine (ALLEGRA) 180 MG tablet Take 180 mg by mouth daily as needed for allergies (seasonal allergies).   fluticasone (FLONASE) 50 MCG/ACT nasal spray Place 1 spray into both nostrils daily as needed (seasonal allergies).   Multiple Vitamin (MULTIVITAMIN WITH MINERALS) TABS tablet Take 1 tablet by mouth every morning.   nitroGLYCERIN (NITROSTAT) 0.4 MG SL tablet Place 1 tablet (0.4 mg total) under the tongue every 5 (five) minutes x 3 doses as needed for chest pain.   sacubitril-valsartan (ENTRESTO) 49-51 MG Take 1 tablet by mouth 2 (two) times daily.   [DISCONTINUED] amLODipine (NORVASC) 10 MG tablet Take 1 tablet by mouth daily.   [DISCONTINUED] atorvastatin (LIPITOR) 80 MG tablet Take 1 tablet (80 mg total) by mouth daily at 6 PM.   [DISCONTINUED] buPROPion (WELLBUTRIN XL) 300 MG 24 hr tablet Take 300 mg by mouth every morning.   [DISCONTINUED] escitalopram (LEXAPRO) 10 MG tablet Take 20 mg by mouth every morning.   No facility-administered  medications prior to visit.   Allergies  Allergen Reactions   Dilaudid [Hydromorphone Hcl] Hives    Can tolerate if premedicated with benadryl   Erythromycin Nausea And Vomiting and Hives   Percocet [Oxycodone-Acetaminophen] Itching and Nausea And Vomiting    Can tolerate with premedication of diphenhydramine and/or nausea medication   Zithromax [Azithromycin] Nausea And Vomiting   Penicillins Hives, Nausea And Vomiting and Rash   Codeine Nausea Only   Oxycodone Other (See Comments)    Other reaction(s): nausea, epigastric pain   Cefprozil Rash    rash but takes Ceftin   Other Rash    Gel on Ekg pads    Immunization History  Administered Date(s) Administered   Influenza,inj,Quad PF,6+ Mos 05/18/2017, 05/11/2018, 05/13/2019, 06/15/2020   Influenza,inj,quad, With Preservative 04/26/2016   PFIZER(Purple Top)SARS-COV-2 Vaccination 09/11/2019, 10/02/2019   Pneumococcal Polysaccharide-23 07/04/2019   Tdap 11/08/2018    Review of Systems All review of systems negative except what is listed in the HPI   Objective    BP 118/78   Pulse 70   Temp 98.3 F (36.8 C) (Oral)   Resp 16   Ht 5' 7.5" (1.715 m)   Wt 189 lb 6 oz (85.9 kg)   SpO2 98%   BMI 29.22 kg/m  Physical Exam Vitals and nursing note reviewed.  Constitutional:      General: She is not in acute distress.    Appearance: Normal appearance.  Eyes:     Extraocular Movements: Extraocular movements intact.     Conjunctiva/sclera: Conjunctivae normal.     Pupils: Pupils are equal, round, and reactive to light.  Neck:     Vascular: No carotid bruit.  Cardiovascular:     Rate and Rhythm: Normal rate and regular rhythm.     Pulses: Normal pulses.     Heart sounds: Normal heart sounds. No murmur heard. Pulmonary:     Effort: Pulmonary effort is normal.     Breath sounds: Normal breath sounds. No wheezing.  Abdominal:     General: Bowel sounds are  normal.     Palpations: Abdomen is soft.  Musculoskeletal:         General: Normal range of motion.     Cervical back: Normal range of motion.     Right lower leg: No edema.     Left lower leg: No edema.  Skin:    General: Skin is warm and dry.     Capillary Refill: Capillary refill takes less than 2 seconds.  Neurological:     General: No focal deficit present.     Mental Status: She is alert and oriented to person, place, and time.  Psychiatric:        Mood and Affect: Mood normal.        Behavior: Behavior normal.        Thought Content: Thought content normal.        Judgment: Judgment normal.     Results for orders placed or performed in visit on 03/04/22  HM MAMMOGRAPHY  Result Value Ref Range   HM Mammogram 0-4 Bi-Rad 0-4 Bi-Rad, Self Reported Normal  HM PAP SMEAR  Result Value Ref Range   HM Pap smear completed   HM DIABETES EYE EXAM  Result Value Ref Range   HM Diabetic Eye Exam No Retinopathy No Retinopathy  HM DIABETES FOOT EXAM  Result Value Ref Range   HM Diabetic Foot Exam completed     Assessment & Plan      Problem List Items Addressed This Visit     Hyperlipidemia    Chronic.  Labs today.  We will make changes to plan of care based on findings.      Relevant Medications   atorvastatin (LIPITOR) 80 MG tablet   CAD (coronary artery disease), native coronary artery    Chronic.  No alarm symptoms present at this time.      Relevant Medications   atorvastatin (LIPITOR) 80 MG tablet   Stage 3a chronic kidney disease (CKD) (HCC)    Chronic.  No recent labs available at this time.  We will obtain labs for monitoring and make changes to plan of care as necessary based on findings.  In the setting of cardiovascular disease and type 2 diabetes strict control is necessary.  Consider Wilder Glade for medication management for protection of kidneys.      Type 2 diabetes mellitus with stage 3a chronic kidney disease, without long-term current use of insulin (HCC)    Historical.  No recent labs available.  Will obtain labs for  evaluation and make recommendations to changes in plan of care based on findings as appropriate.      Relevant Medications   atorvastatin (LIPITOR) 80 MG tablet   Chronic combined systolic and diastolic HF (heart failure) (HCC)    Chronic.  No alarm symptoms present at this time.  She is currently managed with multiple medications and tolerating well.  We will obtain labs for monitoring.      Relevant Medications   atorvastatin (LIPITOR) 80 MG tablet   Hypertension associated with stage 3 chronic kidney disease due to type 2 diabetes mellitus (HCC)    Chronic.  Blood pressure well controlled on current regimen at this time.  No alarm symptoms present.  Recommend continuation of current medications and close monitoring given his significant cardiovascular history.  Recommendations for diet and exercise provided.  We will plan to obtain labs today and follow-up with a CPE in the next few weeks.      Relevant Medications   atorvastatin (LIPITOR) 80  MG tablet   Reactive depression (situational) - Primary    Worsening depression symptoms in the setting of losing her mother and personal health concerns as well as health concerns with her sister.  She has been through quite a lot over the past year.  She is currently on bupropion and Lexapro.  We will plan to increase dose of Lexapro and I will send referral for counseling services today.  We will monitor closely.  She is aware to contact the office if she has any new or worsening symptoms at any time.  No alarm symptoms present at this time.      Relevant Medications   buPROPion (WELLBUTRIN XL) 300 MG 24 hr tablet   escitalopram (LEXAPRO) 20 MG tablet   Other Relevant Orders   Ambulatory referral to Psychology   Other Visit Diagnoses     Need for shingles vaccine       Routine health maintenance       Relevant Orders   CBC With Diff/Platelet   Comprehensive metabolic panel   Hemoglobin A1c   VITAMIN D 25 Hydroxy (Vit-D Deficiency,  Fractures)   Thyroid Panel With TSH   Lipid panel        Return for CPE in the next few weeks with labs a few days before.      Dniyah Grant, Coralee Pesa, NP, DNP, AGNP-C [facility] at [place]

## 2022-03-25 ENCOUNTER — Encounter: Payer: Self-pay | Admitting: Cardiology

## 2022-03-25 ENCOUNTER — Ambulatory Visit: Payer: BC Managed Care – PPO | Admitting: Cardiology

## 2022-03-25 VITALS — BP 128/74 | HR 67 | Resp 16 | Ht 67.0 in | Wt 189.4 lb

## 2022-03-25 DIAGNOSIS — I428 Other cardiomyopathies: Secondary | ICD-10-CM

## 2022-03-25 DIAGNOSIS — I25118 Atherosclerotic heart disease of native coronary artery with other forms of angina pectoris: Secondary | ICD-10-CM

## 2022-03-25 DIAGNOSIS — N1831 Chronic kidney disease, stage 3a: Secondary | ICD-10-CM

## 2022-03-25 DIAGNOSIS — I10 Essential (primary) hypertension: Secondary | ICD-10-CM

## 2022-03-25 DIAGNOSIS — I5042 Chronic combined systolic (congestive) and diastolic (congestive) heart failure: Secondary | ICD-10-CM

## 2022-03-25 NOTE — Progress Notes (Signed)
Primary Physician/Referring:  Orma Render, NP  Patient ID: Andrea Robertson, female    DOB: 1961/03/23, 61 y.o.   MRN: [id number]  Chief Complaint  Patient presents with   Heart failure management    Coronary Artery Disease   Follow-up   HPI:    Andrea Robertson  is a 61 y.o. African-American female patient with history of NSTEMI and CAD, LAD stenting in 2020, diet-controlled diabetes mellitus, hypertension, hyperlipidemia, chronic stage III kidney disease probably due to hypertension and diabetes mellitus, due to new onset of cardiomyopathy with reduced LVEF by echocardiogram to 25 to 30% and abnormal nuclear stress test, underwent cardiac catheterization on 07/27/2021 revealing preserved cardiac output and index and no pulm hypertension, widely patent LAD stent with minimal disease in the other vessels and is on therapy for nonischemic cardiomyopathy.  Patient is here on a 71-monthoffice visit.  She has not had any further episodes of heart failure symptoms including shortness of breath, leg edema, PND or orthopnea.  No chest pain or palpitations or dizziness or syncope.  Tolerating all medications well.  Past Medical History:  Diagnosis Date   Anxiety    Arthritis    Chronic kidney disease    Coronary artery disease involving native coronary artery of native heart without angina pectoris 07/04/2019   Depression    Essential hypertension 10/30/2018   GERD (gastroesophageal reflux disease)    Hyperlipidemia 07/04/2019   [place]    Primary localized osteoarthritis of left knee    S/P total knee arthroplasty, right    Family History  Problem Relation Age of Onset   Heart disease Mother    Multiple myeloma Mother    Heart attack Father 547  CAD Father    Hypertension Sister    Hypertension Sister    Hypertension Sister    Heart failure Brother     Social History   Tobacco Use   Smoking status: Never   Smokeless tobacco: Never  Substance Use  Topics   Alcohol use: Not Currently    Comment: rare   Marital Status: Married  ROS  Review of Systems  Cardiovascular:  Positive for leg swelling (occasional). Negative for chest pain and dyspnea on exertion (improved).   Objective  Blood pressure 128/74, pulse 67, resp. rate 16, height 5' 7" (1.702 m), weight 189 lb 6.4 oz (85.9 kg), SpO2 98 %. Body mass index is 29.66 kg/m.     03/25/2022    9:16 AM 03/04/2022   10:03 AM 12/23/2021    9:21 AM  Vitals with BMI  Height 5' 7" 5' 7.5" 5' 3"  Weight 189 lbs 6 oz 189 lbs 6 oz 185 lbs  BMI 29.66 [phone] Systolic [id number] Diastolic 74 78 82  Pulse 67 70 67     Physical Exam Vitals reviewed.  Constitutional:      Appearance: She is obese.  Neck:     Vascular: No carotid bruit or JVD.  Cardiovascular:     Rate and Rhythm: Normal rate and regular rhythm.     Pulses: Intact distal pulses.     Heart sounds: Normal heart sounds. No murmur heard.    No gallop.  Pulmonary:     Effort: Pulmonary effort is normal.     Breath sounds: Normal breath sounds.  Musculoskeletal:     Right lower leg: No edema.     Left lower leg: No edema.     Laboratory examination:  Recent Labs    07/27/21 0724 07/27/21 1004 11/01/21 1533 12/06/21 1654 12/09/21 0450  NA 137   < > 144 137 139  K 3.5   < > 3.8 3.5 3.6  CL 101   < > 98 102 108  CO2 27   < > _0 GLUCOSE 118*   < > 112* 148* 108*  BUN 13   < > _1 CREATININE 1.28*   < > 1.16* 1.29* 1.23*  CALCIUM 9.1   < > 9.7 9.5 8.6*  GFRNONAA 49*  --   --  48* 51*   < > = values in this interval not displayed.   CrCl cannot be calculated (Patient's most recent lab result is older than the maximum 21 days allowed.).     Latest Ref Rng & Units 12/09/2021    4:50 AM 12/06/2021    4:54 PM 11/01/2021    3:33 PM  CMP  Glucose 70 - 99 mg/dL 108  148  112   BUN 6 - 20 mg/dL _2 Creatinine 0.44 - 1.00 mg/dL 1.23  1.29  1.16   Sodium 135 - 145 mmol/L 139  137  144    Potassium 3.5 - 5.1 mmol/L 3.6  3.5  3.8   Chloride 98 - 111 mmol/L 108  102  98   CO2 22 - 32 mmol/L _3 Calcium 8.9 - 10.3 mg/dL 8.6  9.5  9.7   Total Protein 6.5 - 8.1 g/dL  7.8    Total Bilirubin 0.3 - 1.2 mg/dL  0.9    Alkaline Phos 38 - 126 U/L  74    AST 15 - 41 U/L  15    ALT 0 - 44 U/L  12        Latest Ref Rng & Units 12/09/2021    4:50 AM 12/08/2021   11:06 AM 12/06/2021    4:54 PM  CBC  WBC 4.0 - 10.5 K/uL 10.7  10.9  13.8   Hemoglobin 12.0 - 15.0 g/dL 13.0  13.0  13.1   Hematocrit 36.0 - 46.0 % 37.7  38.7  39.2   Platelets 150 - 400 K/uL 269  255  265     Lipid Panel Recent Labs    06/09/21 1051 08/19/21 1010  CHOL 160 135  TRIG 91 78  LDLCALC 83 57  HDL 60 63   HEMOGLOBIN A1C Lab Results  Component Value Date   HGBA1C 6.8 (H) 11/11/2020   MPG 148.46 11/11/2020   TSH No results for input(s): "TSH" in the last 8760 hours.   Allergies   Allergies  Allergen Reactions   Dilaudid [Hydromorphone Hcl] Hives    Can tolerate if premedicated with benadryl   Erythromycin Nausea And Vomiting and Hives   Percocet [Oxycodone-Acetaminophen] Itching and Nausea And Vomiting    Can tolerate with premedication of diphenhydramine and/or nausea medication   Zithromax [Azithromycin] Nausea And Vomiting   Penicillins Hives, Nausea And Vomiting and Rash   Codeine Nausea Only   Oxycodone Other (See Comments)    Other reaction(s): nausea, epigastric pain   Cefprozil Rash    rash but takes Ceftin   Other Rash    Gel on Ekg pads       Medication list after today's encounter    Current Outpatient Medications:    Accu-Chek FastClix Lancets MISC, USE TO TEST YOUR BLOOD SUGAR TWICE A DAY DX  E11.21, Disp: , Rfl:    amLODipine (NORVASC) 10 MG tablet, Take 0.5 tablets (5 mg total) by mouth daily. (Patient taking differently: Take 5 mg by mouth every morning.), Disp: 90 tablet, Rfl: 3   aspirin EC 81 MG EC tablet, Take 1 tablet (81 mg total) by mouth daily.  (Patient taking differently: Take 81 mg by mouth every morning.), Disp: 90 tablet, Rfl: 3   atorvastatin (LIPITOR) 80 MG tablet, Take 1 tablet by mouth daily., Disp: , Rfl:    buPROPion (WELLBUTRIN XL) 300 MG 24 hr tablet, Take 1 tablet by mouth daily., Disp: , Rfl:    carvedilol (COREG) 25 MG tablet, Take 1.5 tablets (37.5 mg total) by mouth 2 (two) times daily., Disp: 270 tablet, Rfl: 3   [employer] GENTLE LAXATIVE 5 MG EC tablet, Take 5 mg by mouth as directed., Disp: , Rfl:    dapagliflozin propanediol (FARXIGA) 10 MG TABS tablet, Take 1 tablet (10 mg total) by mouth daily before breakfast., Disp: 90 tablet, Rfl: 3   escitalopram (LEXAPRO) 20 MG tablet, Take 1 tablet (20 mg total) by mouth every morning., Disp: 90 tablet, Rfl: 1   ezetimibe (ZETIA) 10 MG tablet, Take 1 tablet (10 mg total) by mouth daily. (Patient taking differently: Take 10 mg by mouth every morning.), Disp: 90 tablet, Rfl: 3   fexofenadine (ALLEGRA) 180 MG tablet, Take 180 mg by mouth daily as needed for allergies (seasonal allergies)., Disp: , Rfl:    fluticasone (FLONASE) 50 MCG/ACT nasal spray, Place 1 spray into both nostrils daily as needed (seasonal allergies)., Disp: , Rfl:    Multiple Vitamin (MULTIVITAMIN WITH MINERALS) TABS tablet, Take 1 tablet by mouth every morning., Disp: , Rfl:    nitroGLYCERIN (NITROSTAT) 0.4 MG SL tablet, Place 1 tablet (0.4 mg total) under the tongue every 5 (five) minutes x 3 doses as needed for chest pain., Disp: 25 tablet, Rfl: 1   sacubitril-valsartan (ENTRESTO) 49-51 MG, Take 1 tablet by mouth 2 (two) times daily., Disp: 60 tablet, Rfl: 2  Radiology:   No results found.  Cardiac Studies:   PCV MYOCARDIAL PERFUSION WO LEXISCAN 06/21/2021 Lexiscan/modified Bruce nuclear stress test performed using 1-day protocol. Patient reached 7 METS, 64% MPHR. In addition, stress EKG showed sinus tachycardia, inferolateral infarct, no acute ischemic changes. SPECT Images showed medium sized, severe  intensity, predominantly fixed perfusion defect with minimal peri-infarct ischemia in apical to basal, inferior/inferoseptal myocardium, associated with absent myocardial thickening. Stress LVEF 35%. Compared to the study done on 06/2020, no significant change.  Previously noted inferior wall scar is unchanged. Anterior and anterolateral scar with minimal reversible defect not noted in the present study. EF was previously calculated at 44%.  High risk study.   Left and right heart Catheterization 07/27/21:  RA: 11/10, mean 8, RA saturation 65% RV: 28/-3, EDP 4 mmHg PA 28/3, mean 16 mmHg.  PA saturation 67%. PW 8/8, mean 5 mmHg.  Aortic saturation 90% QP/QS 1.01, left >right shunt 0.48, 8%, not significant. CO 5.5, CI 2.99, normal. LV: 107/0, EDP 12 mmHg.  Ao 108/60, mean 82 mmHg.  No pressure gradient across the aortic valve.  LV: Distal anterior, apical and inferoapical aneurysm formation.  LVEF 40%.  No significant MR. RCA: Minimal disease, dominant. LM: Large vessel.  Smooth and normal. LAD: Large vessel.  It gives origin to a moderate-sized D1 and D2, D1 has ostial 50 to 60% stenosis.  Mid LAD stent 2.5 x 20 mm Synergy placed 07/03/2019 for NSTEMI widely patent.  LCx: Large vessel, smooth and normal.  Impression: Patient has ischemic cardiomyopathy with apical dyskinesis.  Preserved cardiac output and cardiac index.  There is insignificant right to left shunting.  90 mL contrast utilized.  Patient needs continued aggressive risk modification and medical therapy.   PCV ECHOCARDIOGRAM COMPLETE 12/16/2021  Left ventricle cavity is normal in size and wall thickness. Akinesis of mid to distal anteroseptal, anteroapical, apical, infroaepical regions. LVEF 35-40%. Doppler evidence of grade I (impaired) diastolic dysfunction, normal LAP. Mild (Grade I) aortic regurgitation. Normal right atrial pressure. While wall motion abnormalities persist as seen on 06/01/2021 study, LVEF has improved from  25-30%. (06/2019 LVEF 50-55%)  EKG:   EKG 11/04/2021: Normal sinus rhythm at rate of 73 bpm, left atrial enlargement, inferior infarct old.  Anteroseptal infarct old.  Nonspecific T abnormality.  No significant change from 06/03/2021.  Assessment     ICD-10-CM   1. Non-ischemic cardiomyopathy ([place])  I42.8 PCV ECHOCARDIOGRAM COMPLETE    2. Chronic combined systolic and diastolic HF (heart failure) (HCC)  I50.42 PCV ECHOCARDIOGRAM COMPLETE    3. Coronary artery disease of native artery of native heart with stable angina pectoris ([place])  I25.118 PCV ECHOCARDIOGRAM COMPLETE    4. Primary hypertension  I10     5. Stage 3a chronic kidney disease (HCC)  N18.31        There are no discontinued medications.    No orders of the defined types were placed in this encounter.   Orders Placed This Encounter  Procedures   PCV ECHOCARDIOGRAM COMPLETE    Standing Status:   Future    Standing Expiration Date:   03/26/2023    Scheduling Instructions:     OV after the echo   Recommendations:   Ramona Ruark is a 61 y.o. African-American female patient with history of NSTEMI and CAD, LAD stenting in 2020, diet-controlled diabetes mellitus, hypertension, hyperlipidemia, chronic stage III kidney disease probably due to hypertension and diabetes mellitus, due to new onset of cardiomyopathy with reduced LVEF by echocardiogram to 25 to 30% and abnormal nuclear stress test, underwent cardiac catheterization on 07/27/2021 revealing preserved cardiac output and index and no pulm hypertension, widely patent LAD stent with minimal disease in the other vessels and is started on therapy for nonischemic cardiomyopathy.  Patient is presently on guideline directed medical therapy.  I would like to increase the dose of the Entresto to maximum dose however I do not have her serum creatinine as she has stage IIIa-B chronic kidney disease.  She is presently on Entresto, maximum dose of carvedilol and also [place] and  is tolerating this well.  Her LVEF previously had slightly improved from less than 25% to around 30 to 35%, we will repeat echocardiogram in 3 months follow-up in 3 months, sooner if needed.  Today she is not in any acute decompensated heart failure, blood pressure is well controlled.  Ozempic would be a great choice for her in view of diabetes and also obesity and heart failure and have messaged her PCP.  Once labs are performed, if renal function has remained stable, we could also increase the dose of the Entresto.  I will see her back in 3 months.  Weight loss discussed with the patient.  She appears motivated.   Adrian Prows, PA-C 03/25/2022, 9:30 AM Office: [phone]

## 2022-04-14 ENCOUNTER — Ambulatory Visit (HOSPITAL_BASED_OUTPATIENT_CLINIC_OR_DEPARTMENT_OTHER): Payer: BC Managed Care – PPO

## 2022-04-14 ENCOUNTER — Encounter (HOSPITAL_BASED_OUTPATIENT_CLINIC_OR_DEPARTMENT_OTHER): Payer: Self-pay | Admitting: Nurse Practitioner

## 2022-04-14 DIAGNOSIS — F329 Major depressive disorder, single episode, unspecified: Secondary | ICD-10-CM | POA: Insufficient documentation

## 2022-04-14 NOTE — Assessment & Plan Note (Signed)
Historical.  No recent labs available.  Will obtain labs for evaluation and make recommendations to changes in plan of care based on findings as appropriate.

## 2022-04-14 NOTE — Assessment & Plan Note (Signed)
Chronic.  No alarm symptoms present at this time. 

## 2022-04-14 NOTE — Assessment & Plan Note (Signed)
Chronic.  No recent labs available at this time.  We will obtain labs for monitoring and make changes to plan of care as necessary based on findings.  In the setting of cardiovascular disease and type 2 diabetes strict control is necessary.  Consider Marcelline Deist for medication management for protection of kidneys.

## 2022-04-14 NOTE — Assessment & Plan Note (Signed)
Chronic.  Blood pressure well controlled on current regimen at this time.  No alarm symptoms present.  Recommend continuation of current medications and close monitoring given his significant cardiovascular history.  Recommendations for diet and exercise provided.  We will plan to obtain labs today and follow-up with a CPE in the next few weeks.

## 2022-04-14 NOTE — Assessment & Plan Note (Signed)
Chronic.  No alarm symptoms present at this time.  She is currently managed with multiple medications and tolerating well.  We will obtain labs for monitoring.

## 2022-04-14 NOTE — Assessment & Plan Note (Signed)
Worsening depression symptoms in the setting of losing her mother and personal health concerns as well as health concerns with her sister.  She has been through quite a lot over the past year.  She is currently on bupropion and Lexapro.  We will plan to increase dose of Lexapro and I will send referral for counseling services today.  We will monitor closely.  She is aware to contact the office if she has any new or worsening symptoms at any time.  No alarm symptoms present at this time.

## 2022-04-14 NOTE — Assessment & Plan Note (Signed)
Chronic.  Labs today.  We will make changes to plan of care based on findings.

## 2022-04-15 LAB — BASIC METABOLIC PANEL
BUN/Creatinine Ratio: 11 (ref 9–23)
BUN: 16 mg/dL (ref 6–24)
CO2: 23 mmol/L (ref 20–29)
Calcium: 8.8 mg/dL (ref 8.7–10.2)
Chloride: 103 mmol/L (ref 96–106)
Creatinine, Ser: 1.44 mg/dL — ABNORMAL HIGH (ref 0.57–1.00)
Glucose: 151 mg/dL — ABNORMAL HIGH (ref 70–99)
Potassium: 4 mmol/L (ref 3.5–5.2)
Sodium: 141 mmol/L (ref 134–144)
eGFR: 42 mL/min/{1.73_m2} — ABNORMAL LOW (ref >59)

## 2022-04-15 LAB — CBC
Hematocrit: 39.4 % (ref 34.0–46.6)
Hemoglobin: 13.3 g/dL (ref 11.1–15.9)
MCH: 28.8 pg (ref 26.6–33.0)
MCHC: 33.8 g/dL (ref 31.5–35.7)
MCV: 85 fL (ref 79–97)
Platelets: 266 10*3/uL (ref 150–450)
RBC: 4.62 x10E6/uL (ref 3.77–5.28)
RDW: 15 % (ref 11.7–15.4)
WBC: 8.7 10*3/uL (ref 3.4–10.8)

## 2022-04-19 ENCOUNTER — Ambulatory Visit (INDEPENDENT_AMBULATORY_CARE_PROVIDER_SITE_OTHER): Payer: BC Managed Care – PPO | Admitting: Nurse Practitioner

## 2022-04-19 ENCOUNTER — Encounter (HOSPITAL_BASED_OUTPATIENT_CLINIC_OR_DEPARTMENT_OTHER): Payer: Self-pay | Admitting: Nurse Practitioner

## 2022-04-19 DIAGNOSIS — U071 COVID-19: Secondary | ICD-10-CM

## 2022-04-19 HISTORY — DX: COVID-19: U07.1

## 2022-04-19 MED ORDER — NIRMATRELVIR/RITONAVIR (PAXLOVID)TABLET: 3.0000 | ORAL_TABLET | Freq: Two times a day (BID) | ORAL | 0 refills | Status: AC

## 2022-04-19 MED ORDER — GUAIFENESIN ER 600 MG PO TB12: 1200.0000 mg | ORAL_TABLET | Freq: Two times a day (BID) | ORAL | 3 refills | Status: DC

## 2022-04-19 NOTE — Progress Notes (Signed)
Virtual Visit Encounter telephone visit.   I connected with  Andrea Robertson on 05/15/22 at 10:50 AM EDT by secure audio telemedicine application. I verified that I am speaking with the correct person using two identifiers.   I introduced myself as a Publishing rights manager with the practice. The limitations of evaluation and management by telemedicine discussed with the patient and the availability of in person appointments. The patient expressed verbal understanding and consent to proceed.  Participating parties in this visit include: Myself and patient  The patient is: Patient Location: Home I am: Provider Location: Office/Clinic Subjective:    CC and HPI: Andrea Robertson is a 61 y.o. year old female presenting for new evaluation and treatment of COVID positive. Patient reports the following: - Andrea Robertson endorses sudden onset of chills, temp 101, congestion in nose, dry cough that started yesterday morning.  - She reports she was out of town over the weekend with family - She tested positive for COVID at home today.  - She denies body aches, difficulty breathing/shortness of breath - She has taken paxlovid in the past and it did help her symptoms  Past medical history, Surgical history, Family history not pertinant except as noted below, Social history, Allergies, and medications have been entered into the medical record, reviewed, and corrections made.   Review of Systems:  All review of systems negative except what is listed in the HPI  Objective:    Alert and oriented x 4 Speaking in clear sentences with no shortness of breath. No distress.  Impression and Recommendations:    Problem List Items Addressed This Visit     COVID-19 - Primary    Positive COVID-19 home test with symptoms consistent with infection. No alarm symptoms present that would warrant hospital evaluation. At this time no evidence of respiratory compromise or distress. Will send treatment with paxlovid and  recommend supportive therapies. She will follow-up if symptoms worsen or fail to improve.       Relevant Medications   guaiFENesin (MUCINEX) 600 MG 12 hr tablet    orders and follow up as documented in EMR I discussed the assessment and treatment plan with the patient. The patient was provided an opportunity to ask questions and all were answered. The patient agreed with the plan and demonstrated an understanding of the instructions.   The patient was advised to call back or seek an in-person evaluation if the symptoms worsen or if the condition fails to improve as anticipated.  Follow-Up: prn  I provided 18 minutes of non-face-to-face interaction with this non face-to-face encounter including intake, same-day documentation, and chart review.   Tollie Eth, NP , DNP, AGNP-c [facility] at [facility] [phone] (fax)

## 2022-04-19 NOTE — Patient Instructions (Signed)
[email or web address]

## 2022-04-21 ENCOUNTER — Encounter (HOSPITAL_BASED_OUTPATIENT_CLINIC_OR_DEPARTMENT_OTHER): Payer: BC Managed Care – PPO | Admitting: Nurse Practitioner

## 2022-05-10 ENCOUNTER — Other Ambulatory Visit: Payer: Self-pay | Admitting: Cardiology

## 2022-05-10 DIAGNOSIS — E7800 Pure hypercholesterolemia, unspecified: Secondary | ICD-10-CM

## 2022-05-10 DIAGNOSIS — I10 Essential (primary) hypertension: Secondary | ICD-10-CM

## 2022-05-10 DIAGNOSIS — I5042 Chronic combined systolic (congestive) and diastolic (congestive) heart failure: Secondary | ICD-10-CM

## 2022-05-15 NOTE — Assessment & Plan Note (Signed)
Positive COVID-19 home test with symptoms consistent with infection. No alarm symptoms present that would warrant hospital evaluation. At this time no evidence of respiratory compromise or distress. Will send treatment with paxlovid and recommend supportive therapies. She will follow-up if symptoms worsen or fail to improve.

## 2022-05-17 ENCOUNTER — Ambulatory Visit (INDEPENDENT_AMBULATORY_CARE_PROVIDER_SITE_OTHER): Payer: BC Managed Care – PPO | Admitting: Nurse Practitioner

## 2022-05-17 ENCOUNTER — Encounter (HOSPITAL_BASED_OUTPATIENT_CLINIC_OR_DEPARTMENT_OTHER): Payer: Self-pay | Admitting: Nurse Practitioner

## 2022-05-17 VITALS — BP 127/69 | HR 71 | Ht 63.0 in | Wt 189.0 lb

## 2022-05-17 DIAGNOSIS — N183 Chronic kidney disease, stage 3 unspecified: Secondary | ICD-10-CM

## 2022-05-17 DIAGNOSIS — Z0000 Encounter for general adult medical examination without abnormal findings: Secondary | ICD-10-CM | POA: Diagnosis not present

## 2022-05-17 DIAGNOSIS — I129 Hypertensive chronic kidney disease with stage 1 through stage 4 chronic kidney disease, or unspecified chronic kidney disease: Secondary | ICD-10-CM

## 2022-05-17 DIAGNOSIS — N1831 Chronic kidney disease, stage 3a: Secondary | ICD-10-CM

## 2022-05-17 DIAGNOSIS — E1122 Type 2 diabetes mellitus with diabetic chronic kidney disease: Secondary | ICD-10-CM

## 2022-05-17 DIAGNOSIS — Z23 Encounter for immunization: Secondary | ICD-10-CM | POA: Diagnosis not present

## 2022-05-17 DIAGNOSIS — E7800 Pure hypercholesterolemia, unspecified: Secondary | ICD-10-CM | POA: Diagnosis not present

## 2022-05-17 DIAGNOSIS — R110 Nausea: Secondary | ICD-10-CM

## 2022-05-17 HISTORY — DX: Encounter for general adult medical examination without abnormal findings: Z00.00

## 2022-05-17 LAB — POCT GLYCOSYLATED HEMOGLOBIN (HGB A1C): Hemoglobin A1C: 7.1 % — AB (ref 4.0–5.6)

## 2022-05-17 MED ORDER — ONDANSETRON HCL 4 MG PO TABS: 4.0000 mg | ORAL_TABLET | Freq: Three times a day (TID) | ORAL | 1 refills | Status: DC | PRN

## 2022-05-17 MED ORDER — TIRZEPATIDE 2.5 MG/0.5ML ~~LOC~~ SOAJ
2.5000 mg | SUBCUTANEOUS | 0 refills | Status: DC
Start: 2022-05-17 — End: 2022-06-14

## 2022-05-17 NOTE — Patient Instructions (Addendum)
It was a pleasure seeing you today. I hope your time spent with [place] was pleasant and helpful. Please let us know if there is anything we can do to improve the service you receive.   I have sent in the [facility] for you. You will start with the 2.5mg  dose and after 4 weeks we will increase to the 5mg  dose. We can stay on that as long as your blood sugars are looking good and you are having positive benefit. We can always go up.   Important Office Information Lab Results If labs were ordered, please note that you will see results through [place] as soon as they come available from [place].  It takes up to 5 business days for the results to be routed to me and for me to review them once all of the lab results have come through from [facility]. I will make recommendations based on your results and send these through [place] or someone from the office will call you to discuss. If your labs are abnormal, we may contact you to schedule a visit to discuss the results and make recommendations.  If you have not heard from [place] within 5 business days or you have waited longer than a week and your lab results have not come through on [place], please feel free to call the office or send a message through [place] to follow-up on these labs.   Referrals If referrals were placed today, the office where the referral was sent will contact you either by phone or through [place] to set up scheduling. Please note that it can take up to a week for the referral office to contact you. If you do not hear from them in a week, please contact the referral office directly to inquire about scheduling.   Condition Treated If your condition worsens or you begin to have new symptoms, please schedule a follow-up appointment for further evaluation. If you are not sure if an appointment is needed, you may call the office to leave a message for the nurse and someone will contact you with recommendations.  If you have an urgent or life  threatening emergency, please do not call the office, but seek emergency evaluation by calling 911 or going to the nearest emergency room for evaluation.   MyChart and Phone Calls Please do not use MyChart for urgent messages. It may take up to 3 business days for MyChart messages to be read by staff and if they are unable to handle the request, an additional 3 business days for them to be routed to me and for my response.  Messages sent to the provider through [place] do not come directly to the provider, please allow time for these messages to be routed and for me to respond.  We get a large volume of MyChart messages daily and these are responded to in the order received.   For urgent messages, please call the office at [phone] and speak with the front office staff or leave a message on the line of my assistant for guidance.  We are seeing patients from the hours of 8:00 am through 5:00 pm and calls directly to the nurse may not be answered immediately due to seeing patients, but your call will be returned as soon as possible.  Phone  messages received after 4:00 PM Monday through Thursday may not be returned until the following business day. Phone messages received after 11:00 AM on Friday may not be returned until Monday.   After Hours  We share on call hours with providers from other offices. If you have an urgent need after hours that cannot wait until the next business day, please contact the on call provider by calling the office number. A nurse will speak with you and contact the provider if needed for recommendations.  If you have an urgent or life threatening emergency after hours, please do not call the on call provider, but seek emergency evaluation by calling 911 or going to the nearest emergency room for evaluation.   Paperwork All paperwork requires a minimum of 5 days to complete and return to you or the designated personnel. Please keep this in mind when bringing in forms or  sending requests for paperwork completion to the office.

## 2022-05-17 NOTE — Assessment & Plan Note (Signed)
Chronic.  Blood pressure well controlled at this time.  Labs reviewed.  Due for microalbumin.  No alarm symptoms present at this time.

## 2022-05-17 NOTE — Assessment & Plan Note (Signed)
Chronic.  Kidney function is worsening.  She is on [place].  Hopeful that improved diet and exercise as well as Darcel Bayley will be helpful to reduce the risk of burning.  Plan to follow-up in 3 months.

## 2022-05-17 NOTE — Assessment & Plan Note (Signed)
Chronic.  A1c pending.  Patient's husband has recently been started on Mounjaro and GLP-1 medication was mentioned to her by her cardiologist.  I do feel that this would be an excellent option for her.  We will send prescription for [facility] to pharmacy.  Instructions provided.  We will plan to follow-up in 3 months or sooner if needed.

## 2022-05-17 NOTE — Assessment & Plan Note (Signed)
Chronic Continue statin 

## 2022-05-17 NOTE — Progress Notes (Signed)
BP 127/69   Pulse 71   Ht '5\' 3"'  (1.6 m)   Wt 189 lb (85.7 kg)   SpO2 99%   BMI 33.48 kg/m    Subjective:    Patient ID: Andrea Robertson, female    DOB: October 04, 1960, 61 y.o.   MRN: [id number]  HPI: Andrea Robertson is a 61 y.o. female presenting on 05/17/2022 for comprehensive medical examination.   Current medical concerns include: Weight management  She reports regular vision exams q1-5y: yes She reports regular dental exams q 41m yes Her diet consists of:  Diabetic diet She endorses exercise and/or activity of:  Walking 10 to 15 minutes twice a week She works in:  IResearch scientist (life sciences) She endorses ETOH use ( twice per month ) She denies nictoine use  She denies illegal substance use   She is not having menstrual periods.  She  denies abnormal bleeding. She  denies menopausal symptoms.  She is is sexually active. She currently has 1 sexual partners.  She denies concerns today about STI: testing ordered: no  She denies concerns about skin changes today  She denies concerns about bowel changes today  She denies concerns about bladder changes today   Most Recent Depression Screen:     03/04/2022   10:01 AM 03/04/2022   10:00 AM 10/08/2019    9:23 AM  Depression screen PHQ 2/9  Decreased Interest 2 0 0  Down, Depressed, Hopeless 2 0 0  PHQ - 2 Score 4 0 0  Altered sleeping  0   Tired, decreased energy 0 0   Change in appetite 1 0   Feeling bad or failure about yourself  2 0   Trouble concentrating 2 0   Moving slowly or fidgety/restless 0 0   Suicidal thoughts 0 0   PHQ-9 Score  0   Difficult doing work/chores Somewhat difficult Not difficult at all    Most Recent Anxiety Screen:      No data to display         Most Recent Fall Screen:    03/04/2022   10:02 AM 10/08/2019    9:49 AM  Fall Risk   Falls in the past year? 0 0  Number falls in past yr: 0   Injury with Fall? 0   Risk for fall due to : No Fall Risks Other (Comment)  Risk for fall due  to: Comment  Vertigo, right TKR, needs left TKR  Follow up Falls evaluation completed Falls evaluation completed    All ROS negative except what is listed above and in the HPI.   Past medical history, surgical history, medications, allergies, family history and social history reviewed with patient today and changes made to appropriate areas of the chart.  Past Medical History:  Past Medical History:  Diagnosis Date   [place]   Anxiety    Arthritis    Chronic kidney disease    Coronary artery disease involving native coronary artery of native heart without angina pectoris 07/04/2019   Depression    Essential hypertension 10/30/2018   Essential hypertension 10/30/2018   GERD (gastroesophageal reflux disease)    Hyperlipidemia 07/04/2019   Myocardial infarction ([facility]    Other allergy status, other than to drugs and biological substances 11/10/2020   Palpitations    Primary localized osteoarthritis of left knee    Primary localized osteoarthritis of left knee    [place] major [place] 11/10/2020   S/P  total knee arthroplasty, right    Shortness of breath 03/09/2021   TOA (tubo-ovarian abscess) 12/07/2021   Medications:  Current Outpatient Medications on File Prior to Visit  Medication Sig   Accu-Chek FastClix Lancets MISC USE TO TEST YOUR BLOOD SUGAR TWICE A DAY DX E11.21   amLODipine (NORVASC) 10 MG tablet TAKE 1 TABLET BY MOUTH EVERY DAY   aspirin EC 81 MG EC tablet Take 1 tablet (81 mg total) by mouth daily. (Patient taking differently: Take 81 mg by mouth every morning.)   atorvastatin (LIPITOR) 80 MG tablet Take 1 tablet by mouth daily.   buPROPion (WELLBUTRIN XL) 300 MG 24 hr tablet Take 1 tablet by mouth daily.   carvedilol (COREG) 25 MG tablet Take 1.5 tablets (37.5 mg total) by mouth 2 (two) times daily.   [employer] GENTLE LAXATIVE 5 MG EC tablet Take 5 mg by mouth as directed.   dapagliflozin propanediol (FARXIGA) 10 MG TABS  tablet Take 1 tablet (10 mg total) by mouth daily before breakfast.   ENTRESTO 49-51 MG TAKE 1 TABLET BY MOUTH TWICE A DAY   escitalopram (LEXAPRO) 20 MG tablet Take 1 tablet (20 mg total) by mouth every morning.   ezetimibe (ZETIA) 10 MG tablet TAKE 1 TABLET BY MOUTH EVERY DAY   fexofenadine (ALLEGRA) 180 MG tablet Take 180 mg by mouth daily as needed for allergies (seasonal allergies).   fluticasone (FLONASE) 50 MCG/ACT nasal spray Place 1 spray into both nostrils daily as needed (seasonal allergies).   guaiFENesin (MUCINEX) 600 MG 12 hr tablet Take 2 tablets (1,200 mg total) by mouth 2 (two) times daily.   Multiple Vitamin (MULTIVITAMIN WITH MINERALS) TABS tablet Take 1 tablet by mouth every morning.   nitroGLYCERIN (NITROSTAT) 0.4 MG SL tablet Place 1 tablet (0.4 mg total) under the tongue every 5 (five) minutes x 3 doses as needed for chest pain.   No current facility-administered medications on file prior to visit.   Surgical History:  Past Surgical History:  Procedure Laterality Date   ABDOMINAL HYSTERECTOMY     CHOLECYSTECTOMY     CORONARY STENT INTERVENTION N/A 07/03/2019   Procedure: CORONARY STENT INTERVENTION;  Surgeon: Sherren Mocha, MD;  Location: [place] [facility];  Service: Cardiovascular;  Laterality: N/A;   LEFT HEART CATH AND CORONARY ANGIOGRAPHY N/A 07/03/2019   Procedure: LEFT HEART CATH AND CORONARY ANGIOGRAPHY;  Surgeon: Sherren Mocha, MD;  Location: [place] [facility];  Service: Cardiovascular;  Laterality: N/A;   RIGHT/LEFT HEART CATH AND CORONARY ANGIOGRAPHY N/A 07/27/2021   Procedure: RIGHT/LEFT HEART CATH AND CORONARY ANGIOGRAPHY;  Surgeon: Adrian Prows, MD;  Location: [place] [facility];  Service: Cardiovascular;  Laterality: N/A;   TOTAL KNEE ARTHROPLASTY Right 10/13/2015   Procedure: RIGHT TOTAL KNEE ARTHROPLASTY;  Surgeon: Latanya Maudlin, MD;  Location: [facility];  Service: Orthopedics;  Laterality: Right;   TOTAL KNEE ARTHROPLASTY Left 11/23/2020   Procedure:  TOTAL KNEE ARTHROPLASTY;  Surgeon: Elsie Saas, MD;  Location: [facility];  Service: Orthopedics;  Laterality: Left;   Allergies:  Allergies  Allergen Reactions   Dilaudid [Hydromorphone Hcl] Hives    Can tolerate if premedicated with benadryl   Erythromycin Nausea And Vomiting and Hives   Percocet [Oxycodone-Acetaminophen] Itching and Nausea And Vomiting    Can tolerate with premedication of diphenhydramine and/or nausea medication   Zithromax [Azithromycin] Nausea And Vomiting   Penicillins Hives, Nausea And Vomiting and Rash   Codeine Nausea Only   Oxycodone Other (See Comments)    Other  reaction(s): nausea, epigastric pain   Cefprozil Rash    rash but takes Ceftin   Other Rash    Gel on Ekg pads    Social History:  Social History   Socioeconomic History   Marital status: Married    Spouse name: Not on file   Number of children: 2   Years of education: Not on file   Highest education level: Not on file  Occupational History   Not on file  Tobacco Use   Smoking status: Never   Smokeless tobacco: Never  Vaping Use   Vaping Use: Never used  Substance and Sexual Activity   Alcohol use: Not Currently    Comment: rare   Drug use: No   Sexual activity: Yes  Other Topics Concern   Not on file  Social History Narrative   Not on file   Social Determinants of Health   Financial Resource Strain: Not on file  Food Insecurity: No Food Insecurity (10/08/2019)   Hunger Vital Sign    Worried About Running Out of Food in the Last Year: Never true    Ran Out of Food in the Last Year: Never true  Transportation Needs: No Transportation Needs (10/08/2019)   PRAPARE - Hydrologist (Medical): No    Lack of Transportation (Non-Medical): No  Physical Activity: Not on file  Stress: No Stress Concern Present (10/08/2019)   [place]    Feeling of Stress : Only a little  Social Connections:  Socially Integrated (10/08/2019)   Social Connection and Isolation Panel [NHANES]    Frequency of Communication with Friends and Family: More than three times a week    Frequency of Social Gatherings with Friends and Family: Three times a week    Attends Religious Services: More than 4 times per year    Active Member of Clubs or Organizations: No    Attends Music therapist: More than 4 times per year    Marital Status: Married  Human resources officer Violence: Not on file   Social History   Tobacco Use  Smoking Status Never  Smokeless Tobacco Never   Social History   Substance and Sexual Activity  Alcohol Use Not Currently   Comment: rare   Family History:  Family History  Problem Relation Age of Onset   Heart disease Mother    Multiple myeloma Mother    Heart attack Father 66   CAD Father    Hypertension Sister    Hypertension Sister    Hypertension Sister    Heart failure Brother        Objective:    BP 127/69   Pulse 71   Ht '5\' 3"'  (1.6 m)   Wt 189 lb (85.7 kg)   SpO2 99%   BMI 33.48 kg/m   Wt Readings from Last 3 Encounters:  05/17/22 189 lb (85.7 kg)  03/25/22 189 lb 6.4 oz (85.9 kg)  03/04/22 189 lb 6 oz (85.9 kg)    Physical Exam  Results for orders placed or performed in visit on 05/17/22  POCT glycosylated hemoglobin (Hb A1C)  Result Value Ref Range   Hemoglobin A1C 7.1 (A) 4.0 - 5.6 %   HbA1c POC (<> result, manual entry)     HbA1c, POC (prediabetic range)     HbA1c, POC (controlled diabetic range)      MMUNIZATIONS:   - Tdap: Tetanus vaccination status reviewed: last tetanus booster within 10  years. - Influenza: Administered today - Pneumovax:  Up-to-date - Prevnar:  Up-to-date - HPV: Not applicable - Zostavax vaccine:  Up-to-date  SCREENING COMPLETED: - Pap smear: Not Applicable - STI testing:Not Applicable -Mammogram:  Up-to-date - Colonoscopy: { Up-to-date - Bone Density: {Not Applicable -Hearing Test: Not  Applicable -Spirometry: Not Applicable     Assessment & Plan:   Problem List Items Addressed This Visit     Hyperlipidemia    Chronic.  Continue statin.      Relevant Medications   tirzepatide (MOUNJARO) 2.5 MG/0.5ML Pen   Stage 3a chronic kidney disease (CKD) (HCC)    Chronic.  Kidney function is worsening.  She is on [place].  Hopeful that improved diet and exercise as well as Darcel Bayley will be helpful to reduce the risk of burning.  Plan to follow-up in 3 months.      Relevant Medications   tirzepatide [facility]) 2.5 MG/0.5ML Pen   Type 2 diabetes mellitus with stage 3a chronic kidney disease, without long-term current use of insulin (HCC)    Chronic.  A1c pending.  Patient's husband has recently been started on Mounjaro and GLP-1 medication was mentioned to her by her cardiologist.  I do feel that this would be an excellent option for her.  We will send prescription for [facility] to pharmacy.  Instructions provided.  We will plan to follow-up in 3 months or sooner if needed.      Relevant Medications   tirzepatide (MOUNJARO) 2.5 MG/0.5ML Pen   Other Relevant Orders   POCT glycosylated hemoglobin (Hb A1C) (Completed)   Hypertension associated with stage 3 chronic kidney disease due to type 2 diabetes mellitus (HCC)    Chronic.  Blood pressure well controlled at this time.  Labs reviewed.  Due for microalbumin.  No alarm symptoms present at this time.      Relevant Medications   tirzepatide (MOUNJARO) 2.5 MG/0.5ML Pen   Encounter for annual physical exam - Primary    CPE today with discussion of chronic medical conditions briefly. No alarm symptoms noted. Review of health maintenance activities and anticipatory guidance provided. We will plan to follow-up in 3 months for chronic conditions.      Other Visit Diagnoses     Health care maintenance       Nausea       Relevant Medications   ondansetron (ZOFRAN) 4 MG tablet   Flu vaccine need       Relevant Orders   Flu  Vaccine QUAD 6+ mos PF IM (Fluarix Quad PF) (Completed)          Follow up plan: Return in about 3 months (around 08/16/2022) for DM, CKD.  NEXT PREVENTATIVE PHYSICAL DUE IN 1 YEAR.  PATIENT COUNSELING PROVIDED FOR ALL ADULT PATIENTS:  Consume a well balanced diet low in saturated fats, cholesterol, and moderation in carbohydrates.   This can be as simple as monitoring portion sizes and cutting back on sugary beverages such as soda and juice to start with.    Daily water consumption of at least 64 ounces.  Physical activity at least 180 minutes per week, if just starting out.   This can be as simple as taking the stairs instead of the elevator and walking 2-3 laps around the office  purposefully every day.   STD protection, partner selection, and regular testing if high risk.  Limited consumption of alcoholic beverages if alcohol is consumed.  For women, I recommend no more than 7 alcoholic beverages per week, spread  out throughout the week.  Avoid "binge" drinking or consuming large quantities of alcohol in one setting.   Please let me know if you feel you may need help with reduction or quitting alcohol consumption.   Avoidance of nicotine, if used.  Please let me know if you feel you may need help with reduction or quitting nicotine use.   Daily mental health attention.  This can be in the form of 5 minute daily meditation, prayer, journaling, yoga, reflection, etc.   Purposeful attention to your emotions and mental state can significantly improve your overall wellbeing and Health.  Please know that I am here to help you with all of your health care goals and am happy to work with you to find a solution that works best for you.  The greatest advice I have received with any changes in life are to take it one step at a time, that even means if all you can focus on is the next 60 seconds, then do that and celebrate your victories.  With any changes in life, you will have set  backs, and that is OK. The important thing to remember is, if you have a set back, it is not a failure, it is an opportunity to try again!  Health Maintenance Recommendations Screening Testing Mammogram Every 1 -2 years based on history and risk factors Starting at age 48 Pap Smear Ages 21-39 every 3 years Ages 87-65 every 5 years with HPV testing More frequent testing may be required based on results and history Colon Cancer Screening Every 1-10 years based on test performed, risk factors, and history Starting at age 82 Bone Density Screening Every 2-10 years based on history Starting at age 15 for women Recommendations for men differ based on medication usage, history, and risk factors AAA Screening One time ultrasound Men 49-66 years old who have every smoked Lung Cancer Screening Low Dose Lung CT every 12 months Age 77-80 years with a 30 pack-year smoking history who still smoke or who have quit within the last 15 years  Screening Labs Routine  Labs: Complete Blood Count (CBC), Complete Metabolic Panel (CMP), Cholesterol (Lipid Panel) Every 6-12 months based on history and medications May be recommended more frequently based on current conditions or previous results Hemoglobin A1c Lab Every 3-12 months based on history and previous results Starting at age 59 or earlier with diagnosis of diabetes, high cholesterol, BMI >26, and/or risk factors Frequent monitoring for patients with diabetes to ensure blood sugar control Thyroid Panel (TSH w/ T3 & T4) Every 6 months based on history, symptoms, and risk factors May be repeated more often if on medication HIV One time testing for all patients 64 and older May be repeated more frequently for patients with increased risk factors or exposure Hepatitis C One time testing for all patients 54 and older May be repeated more frequently for patients with increased risk factors or exposure Gonorrhea, Chlamydia Every 12 months for all  sexually active persons 13-24 years Additional monitoring may be recommended for those who are considered high risk or who have symptoms PSA Men 67-33 years old with risk factors Additional screening may be recommended from age 81-69 based on risk factors, symptoms, and history  Vaccine Recommendations Tetanus Booster All adults every 10 years Flu Vaccine All patients 6 months and older every year COVID Vaccine All patients 12 years and older Initial dosing with booster May recommend additional booster based on age and health history HPV Vaccine 2 doses  all patients age 49-26 Dosing may be considered for patients over 26 Shingles Vaccine (Shingrix) 2 doses all adults 1 years and older Pneumonia (Pneumovax 23) All adults 51 years and older May recommend earlier dosing based on health history Pneumonia (Prevnar 13) All adults 34 years and older Dosed 1 year after Pneumovax 23  Additional Screening, Testing, and Vaccinations may be recommended on an individualized basis based on family history, health history, risk factors, and/or exposure.

## 2022-05-17 NOTE — Assessment & Plan Note (Signed)
CPE today with discussion of chronic medical conditions briefly. No alarm symptoms noted. Review of health maintenance activities and anticipatory guidance provided. We will plan to follow-up in 3 months for chronic conditions.

## 2022-06-08 ENCOUNTER — Other Ambulatory Visit (HOSPITAL_BASED_OUTPATIENT_CLINIC_OR_DEPARTMENT_OTHER): Payer: Self-pay

## 2022-06-08 MED ORDER — BUPROPION HCL ER (XL) 300 MG PO TB24: 300.0000 mg | ORAL_TABLET | Freq: Every day | ORAL | 3 refills | Status: DC

## 2022-06-13 ENCOUNTER — Other Ambulatory Visit (HOSPITAL_BASED_OUTPATIENT_CLINIC_OR_DEPARTMENT_OTHER): Payer: Self-pay | Admitting: Nurse Practitioner

## 2022-06-13 DIAGNOSIS — E7800 Pure hypercholesterolemia, unspecified: Secondary | ICD-10-CM

## 2022-06-13 DIAGNOSIS — N183 Chronic kidney disease, stage 3 unspecified: Secondary | ICD-10-CM

## 2022-06-13 DIAGNOSIS — N1831 Chronic kidney disease, stage 3a: Secondary | ICD-10-CM

## 2022-06-23 ENCOUNTER — Encounter (HOSPITAL_BASED_OUTPATIENT_CLINIC_OR_DEPARTMENT_OTHER): Payer: Self-pay | Admitting: Nurse Practitioner

## 2022-06-23 ENCOUNTER — Ambulatory Visit (HOSPITAL_BASED_OUTPATIENT_CLINIC_OR_DEPARTMENT_OTHER): Payer: BC Managed Care – PPO | Admitting: Nurse Practitioner

## 2022-06-23 ENCOUNTER — Other Ambulatory Visit (HOSPITAL_BASED_OUTPATIENT_CLINIC_OR_DEPARTMENT_OTHER): Payer: Self-pay

## 2022-06-23 VITALS — BP 117/63 | HR 70 | Ht 63.0 in | Wt 193.4 lb

## 2022-06-23 DIAGNOSIS — E1169 Type 2 diabetes mellitus with other specified complication: Secondary | ICD-10-CM

## 2022-06-23 DIAGNOSIS — I2510 Atherosclerotic heart disease of native coronary artery without angina pectoris: Secondary | ICD-10-CM

## 2022-06-23 DIAGNOSIS — N183 Chronic kidney disease, stage 3 unspecified: Secondary | ICD-10-CM

## 2022-06-23 DIAGNOSIS — F329 Major depressive disorder, single episode, unspecified: Secondary | ICD-10-CM

## 2022-06-23 DIAGNOSIS — I129 Hypertensive chronic kidney disease with stage 1 through stage 4 chronic kidney disease, or unspecified chronic kidney disease: Secondary | ICD-10-CM

## 2022-06-23 DIAGNOSIS — N1831 Chronic kidney disease, stage 3a: Secondary | ICD-10-CM

## 2022-06-23 DIAGNOSIS — E1122 Type 2 diabetes mellitus with diabetic chronic kidney disease: Secondary | ICD-10-CM | POA: Diagnosis not present

## 2022-06-23 DIAGNOSIS — I428 Other cardiomyopathies: Secondary | ICD-10-CM

## 2022-06-23 DIAGNOSIS — E785 Hyperlipidemia, unspecified: Secondary | ICD-10-CM

## 2022-06-23 DIAGNOSIS — I5042 Chronic combined systolic (congestive) and diastolic (congestive) heart failure: Secondary | ICD-10-CM

## 2022-06-23 MED ORDER — ESCITALOPRAM OXALATE 20 MG PO TABS: 20.0000 mg | ORAL_TABLET | Freq: Every morning | ORAL | 3 refills | Status: DC

## 2022-06-23 MED ORDER — ZOSTER VAC RECOMB ADJUVANTED 50 MCG/0.5ML IM SUSR
INTRAMUSCULAR | 1 refills | Status: DC
  Filled 2022-06-23: qty 0.5, 1d supply, fill #0

## 2022-06-23 MED ORDER — DAPAGLIFLOZIN PROPANEDIOL 10 MG PO TABS: 10.0000 mg | ORAL_TABLET | Freq: Every day | ORAL | 3 refills | Status: DC

## 2022-06-23 NOTE — Progress Notes (Signed)
Worthy Keeler, DNP, AGNP-c [place], [place] [address] [phone] Office [phone] Fax  ESTABLISHED PATIENT- Chronic Health and/or Follow-Up Visit  Blood pressure 117/63, pulse 70, height 5\' 3"  (1.6 m), weight 193 lb 6.4 oz (87.7 kg), SpO2 100 %.    Andrea Robertson is a 61 y.o. year old female presenting today for evaluation and management of the following: Follow-up (Pt presents today for follow up. She has been out of her Wellbutrin. )  Blood Pressure 118/70-120/70 at home. No side effects on medication. Not missing doses. Tolerating medication well with no side effects. Np CH, ShOB, HA, LE edema, cough, dizziness.   Diabetes She has been doing well on the [facility]. She will be on her third dose starting today. No nausea, vomiting, or abdominal pain. She is feeling very good on the medication and she feels like her appetite is decreased.   Depression Recently lost bottle of wellbutrin when out of town and was unable to get a refill due to 3 month supply. Pharmacy did work with her and a self pay 30 day dose was provided. She has restarted this and has been taking it for about 3 weeks and tells me she is feeling much better. No alarm sx present at this time.   Seeing Dr. Nadyne Coombes next week for stress echo and follow-up on 11/16.  All ROS negative with exception of what is listed above.   PHYSICAL EXAM Physical Exam Vitals and nursing note reviewed.  Constitutional:      General: She is not in acute distress.    Appearance: Normal appearance.  HENT:     Head: Normocephalic.  Eyes:     Extraocular Movements: Extraocular movements intact.     Conjunctiva/sclera: Conjunctivae normal.     Pupils: Pupils are equal, round, and reactive to light.  Neck:     Vascular: No carotid bruit.  Cardiovascular:     Rate and Rhythm: Normal rate and regular rhythm.     Pulses: Normal pulses.     Heart sounds:  Normal heart sounds. No murmur heard. Pulmonary:     Effort: Pulmonary effort is normal.     Breath sounds: Normal breath sounds. No wheezing.  Abdominal:     General: Bowel sounds are normal. There is no distension.     Palpations: Abdomen is soft.     Tenderness: There is no abdominal tenderness. There is no guarding.  Musculoskeletal:        General: Normal range of motion.     Cervical back: Normal range of motion and neck supple.     Right lower leg: No edema.     Left lower leg: No edema.  Lymphadenopathy:     Cervical: No cervical adenopathy.  Skin:    General: Skin is warm and dry.     Capillary Refill: Capillary refill takes less than 2 seconds.  Neurological:     General: No focal deficit present.     Mental Status: She is alert and oriented to person, place, and time.  Psychiatric:        Mood and Affect: Mood normal.        Behavior: Behavior normal.        Thought Content: Thought content normal.        Judgment: Judgment normal.    PLAN Problem List Items Addressed This Visit     Hyperlipidemia associated with type 2 diabetes mellitus ([place])  Chronic. Controlled with  Atrovastatin 80 and Zetia 10mg  daily . Continue current lipid-lowering therapy.. Recent labs reviewed- she is due for repeat lipids, but will hold off to have these drawn with additional labs in a few weeks with Dr. as she will be due for the rest of her labs at that time. . Recommend Treatment of diabetes (continue mounjaro and faxiga) Treatment of hypertension (continue amlodipine) Weight reduction (continue mounjaro). Diet and exercise recommendations provided. Follow-up in 19months or sooner based on lab findings as appropriate.        Relevant Medications   dapagliflozin propanediol (FARXIGA) 10 MG TABS tablet   CAD (coronary artery disease), native coronary artery    Chronic. No alarm sx present. Following with cardiology. Statin therapy in place. Working on BG control with 10month.        Relevant Medications   dapagliflozin propanediol (FARXIGA) 10 MG TABS tablet   Other Relevant Orders   POCT UA - Microalbumin   Stage 3a chronic kidney disease (CKD) (HCC) - Primary    Chronic. No alarm symptoms present at this time. Currently is followed by nephrology. Medications reviewed to ensure appropriateness based on currently kidney function. Labs ordered as detailed in EMR. Review of most recent labs shows kidney function has been not changed. Recommend avoid nephrotoxic medications, such as NSAIDs, and speak with provider before starting any new medications or over the counter therapies. Follow-up in 42months or sooner based on labs or if symptoms warrant repeat evaluation.        Relevant Orders   POCT UA - Microalbumin   Type 2 diabetes mellitus with stage 3a chronic kidney disease, without long-term current use of insulin (HCC)    Chronic. Most recent A1c increasing. Recommend: Daily glucose monitoring, AM fasting blood sugar 70-110, 2 hours after meals blood sugar <150, 150-180 grams of carbohydrates and 18 grams of protein per day. Exercise at least 20 minutes per day, Blood pressure monitoring weekly with goal <130/80. Continue diabetes medications ordered Labs with Dr. 10month in a few weeks  Follow-Up: 4 months       Relevant Medications   dapagliflozin propanediol (FARXIGA) 10 MG TABS tablet   Other Relevant Orders   POCT UA - Microalbumin   Non-ischemic cardiomyopathy (HCC)    Chronic. Followed closely with cardiology. Stress ECHO scheduled for next week. Labs are up to date at this time, she will likely have labs with her follow-up with Dr. Anselm Jungling, therefore I will hold off for now. Continue current medication regimen. Will plan to schedule follow-ups between Cardiology so we can stagger assessment.       Chronic combined systolic and diastolic HF (heart failure) (HCC)    Chronic. Following closely with Dr. Charleston Poot for cardiology needs. Recent labs reviewed.  No LE edema, shortness of breath, cough, or excessive weight gain. Recommend continue current medication regimen. Will hold off on labs until appointment with Dr. Anselm Jungling on 11/16 when they are closer to needing recheck. Plan to stagger cardiology and Primary care visits for best care management. F/U in March.       Hypertension associated with stage 3 chronic kidney disease due to type 2 diabetes mellitus (HCC)    Chronic.  No alarm symptoms present at this time.  Goal blood pressure less than less than 130/80.  Refills have been provided today. Recommend: current treatment plan is effective, no change in therapy, lab results and schedule of future lab studies reviewed with patient, orders and follow up as  documented in [place], reviewed diet, exercise and weight control, labs ordered and recent results reviewed with patient. Currently is followed with cardiology. We will make changes to plan of care as necessary based on lab results. Plan to follow-up in 20months.       Relevant Medications   dapagliflozin propanediol (FARXIGA) 10 MG TABS tablet   Reactive depression ([place])    chronic. Anxiety screen reviewed Depression screen reviewed medication list reviewed.Exacerbation recently due to loss of wellbutrin. She has restarted this and is feeling much better.  Refills provided today. Alarm symptoms absent today. Recommend continue current medications. Instructed patient to contact office or on-call physician promptly should condition worsen or any new symptoms appear and provided on-call telephone numbers. IF THE PATIENT HAS ANY SUICIDAL OR HOMICIDAL IDEATIONS, CALL THE OFFICE, DISCUSS WITH A SUPPORT MEMBER, OR GO TO THE ER IMMEDIATELY. Patient was agreeable with this plan. Follow up: 4 months.        Relevant Medications   escitalopram (LEXAPRO) 20 MG tablet    Return for March.   Shawna Clamp, DNP, AGNP-c 06/23/2022  8:17 AM

## 2022-06-23 NOTE — Patient Instructions (Addendum)
We will plan to touch base in March. You can call to schedule this in a month. [phone].  Keep checking your blood sugars and blood pressure.  You should see the difference with your blood sugars as you continue to use the [facility].

## 2022-06-23 NOTE — Assessment & Plan Note (Signed)
Chronic.  No alarm symptoms present at this time.  Goal blood pressure less than less than 130/80.  Refills have been provided today. Recommend: current treatment plan is effective, no change in therapy, lab results and schedule of future lab studies reviewed with patient, orders and follow up as documented in [place], reviewed diet, exercise and weight control, labs ordered and recent results reviewed with patient. Currently is followed with cardiology. We will make changes to plan of care as necessary based on lab results. Plan to follow-up in 19months.

## 2022-06-23 NOTE — Assessment & Plan Note (Signed)
Chronic. No alarm symptoms present at this time. Currently is followed by nephrology. Medications reviewed to ensure appropriateness based on currently kidney function. Labs ordered as detailed in EMR. Review of most recent labs shows kidney function has been not changed. Recommend avoid nephrotoxic medications, such as NSAIDs, and speak with provider before starting any new medications or over the counter therapies. Follow-up in 70months or sooner based on labs or if symptoms warrant repeat evaluation.

## 2022-06-23 NOTE — Assessment & Plan Note (Signed)
Chronic. Most recent A1c increasing. Recommend: Daily glucose monitoring, AM fasting blood sugar 70-110, 2 hours after meals blood sugar <150, 150-180 grams of carbohydrates and 18 grams of protein per day. Exercise at least 20 minutes per day, Blood pressure monitoring weekly with goal <130/80. Continue diabetes medications ordered Labs with Dr. Tamsen Snider in a few weeks  Follow-Up: 4 months

## 2022-06-23 NOTE — Assessment & Plan Note (Signed)
Chronic. Controlled with  Atrovastatin 80 and Zetia 10mg  daily . Continue current lipid-lowering therapy.. Recent labs reviewed- she is due for repeat lipids, but will hold off to have these drawn with additional labs in a few weeks with Dr. Tamsen Snider as she will be due for the rest of her labs at that time. . Recommend Treatment of diabetes (continue mounjaro and faxiga) Treatment of hypertension (continue amlodipine) Weight reduction (continue mounjaro). Diet and exercise recommendations provided. Follow-up in 71months or sooner based on lab findings as appropriate.

## 2022-06-23 NOTE — Assessment & Plan Note (Signed)
Chronic. Followed closely with cardiology. Stress ECHO scheduled for next week. Labs are up to date at this time, she will likely have labs with her follow-up with Dr. Jenkins Rouge, therefore I will hold off for now. Continue current medication regimen. Will plan to schedule follow-ups between Cardiology so we can stagger assessment.

## 2022-06-23 NOTE — Assessment & Plan Note (Signed)
Chronic. Following closely with Dr. Tamsen Snider for cardiology needs. Recent labs reviewed. No LE edema, shortness of breath, cough, or excessive weight gain. Recommend continue current medication regimen. Will hold off on labs until appointment with Dr. Tamsen Snider on 11/16 when they are closer to needing recheck. Plan to stagger cardiology and Primary care visits for best care management. F/U in March.

## 2022-06-23 NOTE — Assessment & Plan Note (Signed)
chronic. Anxiety screen reviewed Depression screen reviewed medication list reviewed.Exacerbation recently due to loss of wellbutrin. She has restarted this and is feeling much better.  Refills provided today. Alarm symptoms absent today. Recommend continue current medications. Instructed patient to contact office or on-call physician promptly should condition worsen or any new symptoms appear and provided on-call telephone numbers. IF THE PATIENT HAS ANY SUICIDAL OR HOMICIDAL IDEATIONS, CALL THE OFFICE, DISCUSS WITH A SUPPORT MEMBER, OR GO TO THE ER IMMEDIATELY. Patient was agreeable with this plan. Follow up: 4 months.

## 2022-06-23 NOTE — Assessment & Plan Note (Signed)
Chronic. No alarm sx present. Following with cardiology. Statin therapy in place. Working on BG control with [employer].

## 2022-06-24 ENCOUNTER — Other Ambulatory Visit (HOSPITAL_BASED_OUTPATIENT_CLINIC_OR_DEPARTMENT_OTHER): Payer: Self-pay

## 2022-06-28 ENCOUNTER — Other Ambulatory Visit: Payer: BC Managed Care – PPO

## 2022-06-30 ENCOUNTER — Ambulatory Visit: Payer: BC Managed Care – PPO

## 2022-06-30 DIAGNOSIS — I428 Other cardiomyopathies: Secondary | ICD-10-CM

## 2022-06-30 DIAGNOSIS — I5042 Chronic combined systolic (congestive) and diastolic (congestive) heart failure: Secondary | ICD-10-CM

## 2022-06-30 DIAGNOSIS — I25118 Atherosclerotic heart disease of native coronary artery with other forms of angina pectoris: Secondary | ICD-10-CM

## 2022-07-04 NOTE — Progress Notes (Signed)
You are seeing her soon

## 2022-07-07 ENCOUNTER — Ambulatory Visit: Payer: BC Managed Care – PPO

## 2022-07-07 VITALS — BP 135/85 | HR 81 | Ht 63.0 in | Wt 186.2 lb

## 2022-07-07 DIAGNOSIS — I5042 Chronic combined systolic (congestive) and diastolic (congestive) heart failure: Secondary | ICD-10-CM

## 2022-07-07 DIAGNOSIS — N1831 Chronic kidney disease, stage 3a: Secondary | ICD-10-CM

## 2022-07-07 DIAGNOSIS — I10 Essential (primary) hypertension: Secondary | ICD-10-CM

## 2022-07-07 DIAGNOSIS — I428 Other cardiomyopathies: Secondary | ICD-10-CM

## 2022-07-07 NOTE — Progress Notes (Signed)
Primary Physician/Referring:  Orma Render, NP  Patient ID: Andrea Robertson, female    DOB: April 05, 1961, 61 y.o.   MRN: [id number]  Chief Complaint  Patient presents with   Follow-up    3 month   Congestive Heart Failure   Hypertension   Coronary Artery Disease   HPI:    Andrea Robertson  is a 61 y.o. African-American female patient with history of NSTEMI and CAD, LAD stenting in 2020, diet-controlled diabetes mellitus, hypertension, hyperlipidemia, chronic stage III kidney disease probably due to hypertension and diabetes mellitus, due to new onset of cardiomyopathy with reduced LVEF by echocardiogram to 25 to 30% and abnormal nuclear stress test, underwent cardiac catheterization on 07/27/2021 revealing preserved cardiac output and index and no pulm hypertension, widely patent LAD stent with minimal disease in the other vessels and is on therapy for nonischemic cardiomyopathy.  Patient is here on a 5-monthoffice visit.  She has not had any further episodes of heart failure symptoms including shortness of breath, leg edema, PND or orthopnea.  No chest pain or palpitations or dizziness or syncope.  Tolerating all medications well.  She has recently started taking Mounjaro and has had some weight loss.  She is making changes to her diet and incorporating exercise.  Past Medical History:  Diagnosis Date   AKI (acute kidney injury) ([facility] 03/09/2021   Anxiety    Arthritis    Chronic kidney disease    Coronary artery disease involving native coronary artery of native heart without angina pectoris 07/04/2019   COVID-19 04/19/2022   Depression    Encounter for annual physical exam 05/17/2022   Essential hypertension 10/30/2018   Essential hypertension 10/30/2018   GERD (gastroesophageal reflux disease)    Hyperlipidemia 07/04/2019   Ischemic cardiomyopathy    Myocardial infarction ([facility]    Other allergy status, other than to drugs and biological substances 11/10/2020    Palpitations    Perforation of sigmoid colon due to diverticulitis 12/23/2020   Primary localized osteoarthritis of left knee    Primary localized osteoarthritis of left knee    Recurrent major depression in remission ([facility] 11/10/2020   S/P total knee arthroplasty, right    Shortness of breath 03/09/2021   TOA (tubo-ovarian abscess) 12/07/2021   Family History  Problem Relation Age of Onset   Heart disease Mother    Multiple myeloma Mother    Heart attack Father 565  CAD Father    Hypertension Sister    Hypertension Sister    Hypertension Sister    Heart failure Brother     Social History   Tobacco Use   Smoking status: Never   Smokeless tobacco: Never  Substance Use Topics   Alcohol use: Not Currently    Comment: rare   Marital Status: Married  ROS  Review of Systems  Cardiovascular:  Negative for chest pain, dyspnea on exertion (improved), leg swelling, orthopnea, palpitations and paroxysmal nocturnal dyspnea.   Objective  Blood pressure 135/85, pulse 81, height _0  (1.6 m), weight 186 lb 3.2 oz (84.5 kg), SpO2 98 %. Body mass index is 32.98 kg/m.     07/07/2022    8:44 AM 06/23/2022    8:13 AM 05/17/2022    3:25 PM  Vitals with BMI  Height _1  _2  _3   Weight 186 lbs 3 oz 193 lbs 6 oz 189 lbs  BMI 32.99 [id number] Systolic [id number] Diastolic 85 63 69  Pulse 81  70 71     Physical Exam Vitals reviewed.  Neck:     Vascular: No carotid bruit or JVD.  Cardiovascular:     Rate and Rhythm: Normal rate and regular rhythm.     Pulses: Intact distal pulses.     Heart sounds: Normal heart sounds. No murmur heard.    No gallop.  Pulmonary:     Effort: Pulmonary effort is normal.     Breath sounds: Normal breath sounds.  Musculoskeletal:     Right lower leg: No edema.     Left lower leg: No edema.     Laboratory examination:   Recent Labs    07/27/21 0724 07/27/21 1004 12/06/21 1654 12/09/21 0450 04/14/22 0848  NA 137   < > 137 139 141  K  3.5   < > 3.5 3.6 4.0  CL 101   < > 102 108 103  CO2 27   < > _0 GLUCOSE 118*   < > 148* 108* 151*  BUN 13   < > _1 CREATININE 1.28*   < > 1.29* 1.23* 1.44*  CALCIUM 9.1   < > 9.5 8.6* 8.8  GFRNONAA 49*  --  48* 51*  --    < > = values in this interval not displayed.   CrCl cannot be calculated (Patient's most recent lab result is older than the maximum 21 days allowed.).     Latest Ref Rng & Units 04/14/2022    8:48 AM 12/09/2021    4:50 AM 12/06/2021    4:54 PM  CMP  Glucose 70 - 99 mg/dL 151  108  148   BUN 6 - 24 mg/dL _2 Creatinine 0.57 - 1.00 mg/dL 1.44  1.23  1.29   Sodium 134 - 144 mmol/L 141  139  137   Potassium 3.5 - 5.2 mmol/L 4.0  3.6  3.5   Chloride 96 - 106 mmol/L 103  108  102   CO2 20 - 29 mmol/L _3 Calcium 8.7 - 10.2 mg/dL 8.8  8.6  9.5   Total Protein 6.5 - 8.1 g/dL   7.8   Total Bilirubin 0.3 - 1.2 mg/dL   0.9   Alkaline Phos 38 - 126 U/L   74   AST 15 - 41 U/L   15   ALT 0 - 44 U/L   12       Latest Ref Rng & Units 04/14/2022    8:48 AM 12/09/2021    4:50 AM 12/08/2021   11:06 AM  CBC  WBC 3.4 - 10.8 x10E3/uL 8.7  10.7  10.9   Hemoglobin 11.1 - 15.9 g/dL 13.3  13.0  13.0   Hematocrit 34.0 - 46.6 % 39.4  37.7  38.7   Platelets 150 - 450 x10E3/uL 266  269  255     Lipid Panel Recent Labs    08/19/21 1010  CHOL 135  TRIG 78  LDLCALC 57  HDL 63   HEMOGLOBIN A1C Lab Results  Component Value Date   HGBA1C 7.1 (A) 05/17/2022   MPG 148.46 11/11/2020   TSH No results for input(s): "TSH" in the last 8760 hours.   Allergies   Allergies  Allergen Reactions   Dilaudid [Hydromorphone Hcl] Hives    Can tolerate if premedicated with benadryl   Erythromycin Hives and Nausea And Vomiting   Percocet [Oxycodone-Acetaminophen] Itching and Nausea And Vomiting  Can tolerate with premedication of diphenhydramine and/or nausea medication   Zithromax [Azithromycin] Nausea And Vomiting   Penicillins Hives, Nausea And  Vomiting and Rash   Codeine Nausea Only   Oxycodone Other (See Comments)    Other reaction(s): nausea, epigastric pain   Cefprozil Rash    rash but takes Ceftin   Other Rash and Other (See Comments)    Gel on Ekg pads      Medication list after today's encounter    Current Outpatient Medications:    Accu-Chek FastClix Lancets MISC, USE TO TEST YOUR BLOOD SUGAR TWICE A DAY DX E11.21, Disp: , Rfl:    amLODipine (NORVASC) 10 MG tablet, TAKE 1 TABLET BY MOUTH EVERY DAY (Patient taking differently: Take 5 mg by mouth daily.), Disp: 90 tablet, Rfl: 3   aspirin EC 81 MG EC tablet, Take 1 tablet (81 mg total) by mouth daily. (Patient taking differently: Take 81 mg by mouth every morning.), Disp: 90 tablet, Rfl: 3   atorvastatin (LIPITOR) 80 MG tablet, Take 1 tablet by mouth daily., Disp: , Rfl:    buPROPion (WELLBUTRIN XL) 300 MG 24 hr tablet, Take 1 tablet (300 mg total) by mouth daily., Disp: 90 tablet, Rfl: 3   carvedilol (COREG) 25 MG tablet, Take 1.5 tablets (37.5 mg total) by mouth 2 (two) times daily., Disp: 270 tablet, Rfl: 3   [employer] GENTLE LAXATIVE 5 MG EC tablet, Take 5 mg by mouth as directed., Disp: , Rfl:    dapagliflozin propanediol (FARXIGA) 10 MG TABS tablet, Take 1 tablet (10 mg total) by mouth daily before breakfast., Disp: 90 tablet, Rfl: 3   ENTRESTO 49-51 MG, TAKE 1 TABLET BY MOUTH TWICE A DAY, Disp: 60 tablet, Rfl: 2   escitalopram (LEXAPRO) 20 MG tablet, Take 1 tablet (20 mg total) by mouth every morning., Disp: 90 tablet, Rfl: 3   ezetimibe (ZETIA) 10 MG tablet, TAKE 1 TABLET BY MOUTH EVERY DAY, Disp: 90 tablet, Rfl: 3   fexofenadine (ALLEGRA) 180 MG tablet, Take 180 mg by mouth daily as needed for allergies (seasonal allergies)., Disp: , Rfl:    fluticasone (FLONASE) 50 MCG/ACT nasal spray, Place 1 spray into both nostrils daily as needed (seasonal allergies)., Disp: , Rfl:    guaiFENesin (MUCINEX) 600 MG 12 hr tablet, Take 2 tablets (1,200 mg total) by mouth 2 (two) times  daily. (Patient taking differently: Take 1,200 mg by mouth 2 (two) times daily as needed.), Disp: 30 tablet, Rfl: 3   Multiple Vitamin (MULTIVITAMIN WITH MINERALS) TABS tablet, Take 1 tablet by mouth every morning., Disp: , Rfl:    nitroGLYCERIN (NITROSTAT) 0.4 MG SL tablet, Place 1 tablet (0.4 mg total) under the tongue every 5 (five) minutes x 3 doses as needed for chest pain., Disp: 25 tablet, Rfl: 1   ondansetron (ZOFRAN) 4 MG tablet, Take 1 tablet (4 mg total) by mouth every 8 (eight) hours as needed for nausea or vomiting., Disp: 30 tablet, Rfl: 1   tirzepatide (MOUNJARO) 5 MG/0.5ML Pen, Inject 5 mg into the skin once a week., Disp: 6 mL, Rfl: 2   Zoster Vaccine Adjuvanted (SHINGRIX) injection, Inject into the muscle., Disp: 0.5 mL, Rfl: 1  Radiology:   No results found.  Cardiac Studies:   PCV MYOCARDIAL PERFUSION WO LEXISCAN 06/21/2021 Lexiscan/modified Bruce nuclear stress test performed using 1-day protocol. Patient reached 7 METS, 64% MPHR. In addition, stress EKG showed sinus tachycardia, inferolateral infarct, no acute ischemic changes. SPECT Images showed medium sized, severe intensity, predominantly  fixed perfusion defect with minimal peri-infarct ischemia in apical to basal, inferior/inferoseptal myocardium, associated with absent myocardial thickening. Stress LVEF 35%. Compared to the study done on 06/2020, no significant change.  Previously noted inferior wall scar is unchanged. Anterior and anterolateral scar with minimal reversible defect not noted in the present study. EF was previously calculated at 44%.  High risk study.   Left and right heart Catheterization 07/27/21:  RA: 11/10, mean 8, RA saturation 65% RV: 28/-3, EDP 4 mmHg PA 28/3, mean 16 mmHg.  PA saturation 67%. PW 8/8, mean 5 mmHg.  Aortic saturation 90% QP/QS 1.01, left >right shunt 0.48, 8%, not significant. CO 5.5, CI 2.99, normal. LV: 107/0, EDP 12 mmHg.  Ao 108/60, mean 82 mmHg.  No pressure gradient  across the aortic valve.  LV: Distal anterior, apical and inferoapical aneurysm formation.  LVEF 40%.  No significant MR. RCA: Minimal disease, dominant. LM: Large vessel.  Smooth and normal. LAD: Large vessel.  It gives origin to a moderate-sized D1 and D2, D1 has ostial 50 to 60% stenosis.  Mid LAD stent 2.5 x 20 mm Synergy placed 07/03/2019 for NSTEMI widely patent. LCx: Large vessel, smooth and normal.  Impression: Patient has ischemic cardiomyopathy with apical dyskinesis.  Preserved cardiac output and cardiac index.  There is insignificant right to left shunting.  90 mL contrast utilized. Patient needs continued aggressive risk modification and medical therapy.     Echocardiogram 06/01/2022: Mildly depressed LV systolic function with visual EF 40-45%. Left ventricle cavity is normal in size. Normal left ventricular wall thickness. Doppler evidence of grade I (impaired) diastolic dysfunction, normal LAP. Left ventricle regional wall motion findings: Mid anteroseptal, Apical anterior, Apical lateral, Apical inferior, Apical septal and Apical cap hypokinesis. Calculated EF 46%. Structurally normal trileaflet aortic valve.  Trace aortic regurgitation. Structurally normal tricuspid valve with trace regurgitation. No evidence of pulmonary hypertension. Compared to 11/2021, EF improved from 30-35% to 40-45%.   EKG:   EKG 07/07/2022: Normal sinus rhythm at rate of 79 bpm.  Normal axis.  Left atrial enlargement, old inferior infarct.  Old anterior septal infarct.  Diffuse nonspecific T wave abnormality.  Compared to previous EKG on 11/04/2021, no significant change.  Assessment     ICD-10-CM   1. Chronic combined systolic and diastolic HF (heart failure) (HCC)  V69.79 Basic Metabolic Panel (BMET)    2. Stage 3a chronic kidney disease (HCC)  Y80.16 Basic Metabolic Panel (BMET)    3. Primary hypertension  I10 EKG 12-Lead    4. Non-ischemic cardiomyopathy (HCC)  I42.8        There  are no discontinued medications.    No orders of the defined types were placed in this encounter.   Orders Placed This Encounter  Procedures   Basic Metabolic Panel (BMET)   EKG 12-Lead   Recommendations:   Amenah Tucci is a 61 y.o. African-American female patient with history of NSTEMI and CAD, LAD stenting in 2020, diet-controlled diabetes mellitus, hypertension, hyperlipidemia, chronic stage III kidney disease probably due to hypertension and diabetes mellitus, due to new onset of cardiomyopathy with reduced LVEF by echocardiogram to 25 to 30% and abnormal nuclear stress test, underwent cardiac catheterization on 07/27/2021 revealing preserved cardiac output and index and no pulm hypertension, widely patent LAD stent with minimal disease in the other vessels and is started on therapy for nonischemic cardiomyopathy.  Chronic combined systolic and diastolic HF (heart failure) (HCC) Non-ischemic cardiomyopathy (HCC) Of heart failure on physical exam.  She  is on guideline directed medical therapy. During last visit with Dr. Einar Gip he did discuss possibly increasing her Entresto to maximum dose.  Reviewed lab results following previous visit kidney function is stable but creatinine has slightly increased and EGFR has decreased..  We will continue current dose of Entresto at this time and recheck labs prior to next visit. Reviewed recent echo results, EF improved from 30-35% to 40-45%. She is presently on maximum dose of carvedilol and also [place] and is tolerating this well. EKG at today's visit unchanged from previous with no evidence of ischemia or underlying injury pattern.  Stage 3a chronic kidney disease ([place]) Reviewed previous lab results from August 2023, creatinine 1.44 and EGFR 42. Check labs prior to next visit.  Primary hypertension Blood pressure is overall well controlled.  Continue current as without changes. Discussed the importance of low-sodium diet less than 2000 mg  daily.  She has recently started [facility] and has seen some weight loss since starting this.  Feel that continued weight loss and incorporating exercise will help with continued blood pressure control.  Follow-up in 3 months or sooner if needed.   Ernst Spell, AGNP-C 07/07/2022, 10:37 AM Office: [phone]

## 2022-07-11 ENCOUNTER — Other Ambulatory Visit (HOSPITAL_BASED_OUTPATIENT_CLINIC_OR_DEPARTMENT_OTHER): Payer: Self-pay

## 2022-07-11 IMAGING — US US BREAST*R* LIMITED INC AXILLA
1 series · 2 of 2 positions shown · non-contrast
Comparison: 03/05/2020

CLINICAL DATA: 58-year-old female for 3 month follow-up of probable
RIGHT breast fat necrosis.

EXAM:
ULTRASOUND OF THE RIGHT BREAST

[Series 1: us breast*right* limited inc axilla · 0.06mm/px · 2 of 2 slices shown]
[im 1/2]
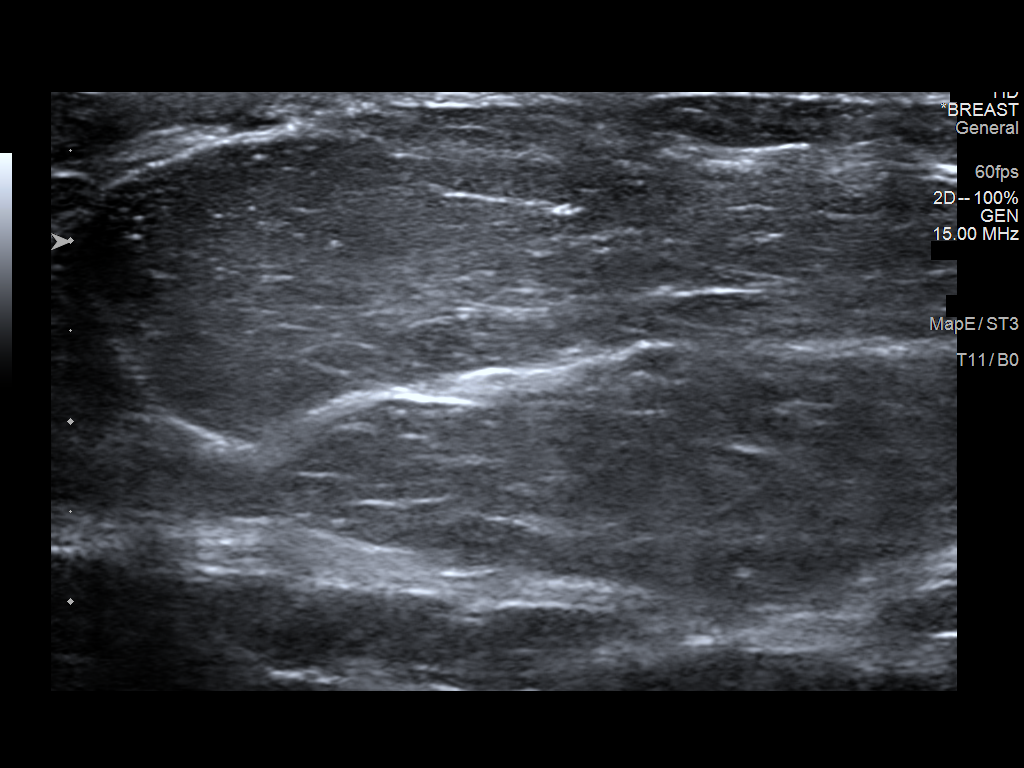
[im 2/2]
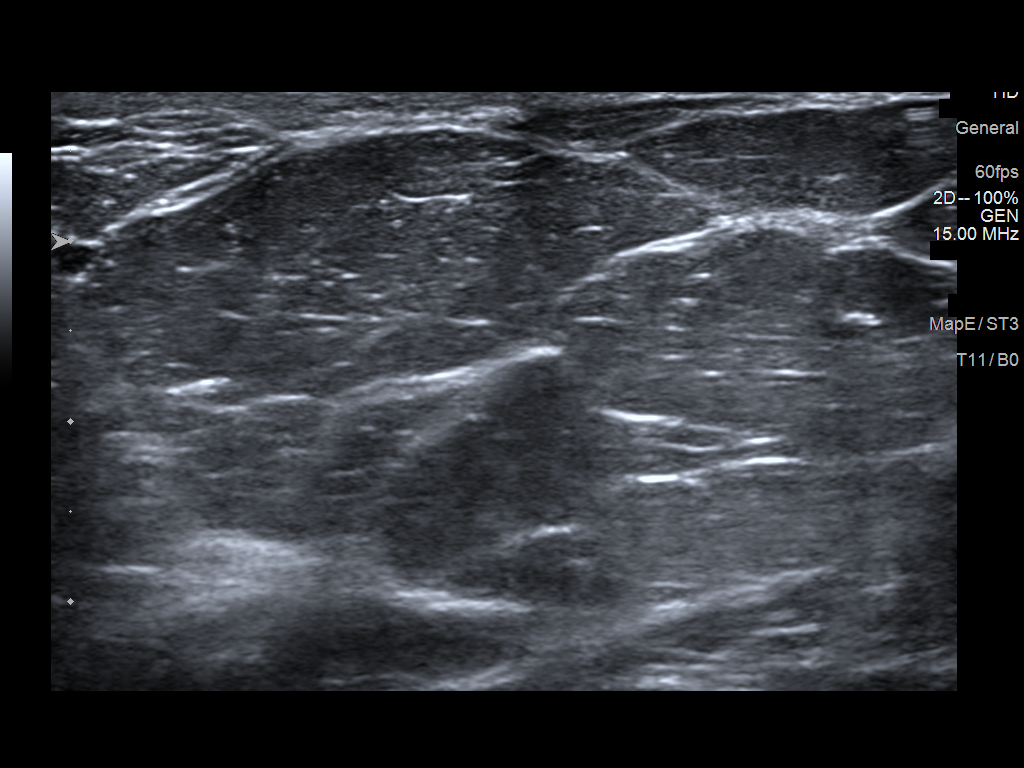

[2 of 2 positions shown; findings below may reference images not displayed]

FINDINGS: Targeted ultrasound is performed, showing no sonographic abnormality
at the 2 o'clock position of the RETROAREOLAR RIGHT breast. The
previously identified probable hematoma/fat necrosis has resolved.
IMPRESSION: Resolution of hematoma/fat necrosis changes in the RETROAREOLAR
RIGHT breast. No further imaging follow-up recommended.

RECOMMENDATION:
Bilateral screening mammogram in February 2021 to resume annual
mammogram schedule.

I have discussed the findings and recommendations with the patient.
If applicable, a reminder letter will be sent to the patient
regarding the next appointment.

BI-RADS CATEGORY  1: Negative.

## 2022-07-26 ENCOUNTER — Telehealth: Payer: Self-pay | Admitting: Nurse Practitioner

## 2022-07-26 NOTE — Telephone Encounter (Signed)
Left message for pt to call per Martie Lee pt needs an appt in March

## 2022-08-18 ENCOUNTER — Ambulatory Visit (HOSPITAL_BASED_OUTPATIENT_CLINIC_OR_DEPARTMENT_OTHER): Payer: BC Managed Care – PPO | Admitting: Nurse Practitioner

## 2022-08-30 ENCOUNTER — Encounter: Payer: Self-pay | Admitting: Nurse Practitioner

## 2022-08-30 ENCOUNTER — Ambulatory Visit: Payer: BC Managed Care – PPO | Admitting: Nurse Practitioner

## 2022-08-30 VITALS — BP 120/76 | HR 72 | Ht 62.5 in | Wt 186.4 lb

## 2022-08-30 DIAGNOSIS — E785 Hyperlipidemia, unspecified: Secondary | ICD-10-CM

## 2022-08-30 DIAGNOSIS — E1169 Type 2 diabetes mellitus with other specified complication: Secondary | ICD-10-CM

## 2022-08-30 DIAGNOSIS — I25118 Atherosclerotic heart disease of native coronary artery with other forms of angina pectoris: Secondary | ICD-10-CM

## 2022-08-30 DIAGNOSIS — E1122 Type 2 diabetes mellitus with diabetic chronic kidney disease: Secondary | ICD-10-CM

## 2022-08-30 DIAGNOSIS — R0683 Snoring: Secondary | ICD-10-CM

## 2022-08-30 DIAGNOSIS — N183 Chronic kidney disease, stage 3 unspecified: Secondary | ICD-10-CM

## 2022-08-30 DIAGNOSIS — D573 Sickle-cell trait: Secondary | ICD-10-CM

## 2022-08-30 DIAGNOSIS — I428 Other cardiomyopathies: Secondary | ICD-10-CM

## 2022-08-30 DIAGNOSIS — I252 Old myocardial infarction: Secondary | ICD-10-CM

## 2022-08-30 DIAGNOSIS — I129 Hypertensive chronic kidney disease with stage 1 through stage 4 chronic kidney disease, or unspecified chronic kidney disease: Secondary | ICD-10-CM

## 2022-08-30 DIAGNOSIS — N1831 Chronic kidney disease, stage 3a: Secondary | ICD-10-CM | POA: Diagnosis not present

## 2022-08-30 MED ORDER — DEXCOM G7 SENSOR MISC: 11 refills | Status: DC

## 2022-08-30 MED ORDER — OZEMPIC (0.25 OR 0.5 MG/DOSE) 2 MG/3ML ~~LOC~~ SOPN: 0.5000 mg | PEN_INJECTOR | SUBCUTANEOUS | 0 refills | Status: DC

## 2022-08-30 NOTE — Progress Notes (Signed)
Orma Render, DNP, AGNP-c [address] [place], [place] [id number] [phone]  Subjective:   Andrea Robertson is a 62 y.o. female presents to day for evaluation of: Allergic Reaction Mical reports swelling, erythema, pruritus, and warmth to the area of injection with mounjaro after each injection no matter where she injects the medication. She tells me this has been present since she first started using the medication, but it is getting a little worse with each injection. The area will begin to resolve about the same time her next injection is due, but discoloration to the skin remains present.  CGM She spoke to her insurance company and they recommend that she request a dexcom for blood sugar monitoring. She tells me she has not been checking her blood sugar as often as she should.  Snoring She reports loud snoring with apneic episodes described by her husband on a nightly basis. She has had a sleep study about 15 years ago and this was negative, but she reports her symptoms have worsened. She tells me that she does have daytime fatigue.   PMH, Medications, and Allergies reviewed and updated in chart as appropriate.   ROS negative except for what is listed in HPI. Objective:  BP 120/76   Pulse 72   Ht 5' 2.5" (1.588 m)   Wt 186 lb 6.4 oz (84.6 kg)   BMI 33.55 kg/m  Physical Exam Vitals and nursing note reviewed.  Constitutional:      Appearance: Normal appearance.  HENT:     Head: Normocephalic.  Eyes:     Extraocular Movements: Extraocular movements intact.     Pupils: Pupils are equal, round, and reactive to light.  Cardiovascular:     Rate and Rhythm: Normal rate.  Pulmonary:     Effort: Pulmonary effort is normal.  Musculoskeletal:        General: Normal range of motion.     Cervical back: Normal range of motion.  Skin:    General: Skin is warm and dry.     Findings: Erythema present.       Neurological:     General: No focal  deficit present.     Mental Status: She is alert and oriented to person, place, and time.           Assessment & Plan:   Problem List Items Addressed This Visit     History of MI (myocardial infarction)    No alarm sx present at this time. Appears stable with no new anginal symptoms. Continue current regimen.       Relevant Medications   Continuous Blood Gluc Sensor (DEXCOM G7 SENSOR) MISC   Hyperlipidemia associated with type 2 diabetes mellitus (HCC)    Chronic. Recent labs reviewed today. No changes in medication at this time. Recommend close monitoring of diet and exercise. Continue to follow with cardiology.       Relevant Medications   Semaglutide,0.25 or 0.5MG /DOS, (OZEMPIC, 0.25 OR 0.5 MG/DOSE,) 2 MG/3ML SOPN   Continuous Blood Gluc Sensor (DEXCOM G7 SENSOR) MISC   Coronary artery disease of native artery of native heart with stable angina pectoris (HCC)    Chronic. No alarm sx present at this time. Given the complexity of her multiple co-morbid conditions including CAD, HF, DM, HTN, HLD, and CKD I recommend close blood glucose monitoring and management to ensure that conditions are closely managed. Recent labs reviewed today. Will continue to monitor closely. No medication changes today.  Stage 3a chronic kidney disease (CKD) (HCC)    Chronic and associated with DM, HLD, HTN, DM, and CAD. Labs reviewed today with no alarm sx present. Recommend continued tight BG control and avoidance of nephrotoxic medication. Will continue to monitor closely.       Relevant Medications   Semaglutide,0.25 or 0.5MG /DOS, (OZEMPIC, 0.25 OR 0.5 MG/DOSE,) 2 MG/3ML SOPN   Continuous Blood Gluc Sensor (DEXCOM G7 SENSOR) MISC   Sickle cell trait (HCC)    Chronic. Labs stable. No alarm sx present.       Type 2 diabetes mellitus with stage 3a chronic kidney disease, without long-term current use of insulin (HCC) - Primary    Chronic with multiple associated co-morbidities including HTN,  HLD, Cardiomyopathy, CAD, CHF, and CKD. Given the complexity of her conditions very tight control of her blood sugar is imperative to reduce risk of worsening conditions. At this time I feel strongly that CGM therapy is most appropriate for her as this has been quite effective, however, she is having interaction with the mounjaro at this time, therefore, we will switch to ozempic to see if this is effective. We will also plan for CGM to help ensure tight control. No alarm sx are present at this time. We will continue to monitor closely.       Relevant Medications   Semaglutide,0.25 or 0.5MG /DOS, (OZEMPIC, 0.25 OR 0.5 MG/DOSE,) 2 MG/3ML SOPN   Continuous Blood Gluc Sensor (DEXCOM G7 SENSOR) MISC   Non-ischemic cardiomyopathy (HCC)    No alarm symptoms present at this time. Continue to monitor.       Relevant Medications   Continuous Blood Gluc Sensor (DEXCOM G7 SENSOR) MISC   Hypertension associated with stage 3 chronic kidney disease due to type 2 diabetes mellitus (HCC)    Chronic. BP well controlled in the office today with no alarm symptoms present. At this time recommend continuation of current medication with tight control of blood sugar and lipids given the associated co-morbid conditions.       Relevant Medications   Semaglutide,0.25 or 0.5MG /DOS, (OZEMPIC, 0.25 OR 0.5 MG/DOSE,) 2 MG/3ML SOPN   Continuous Blood Gluc Sensor (DEXCOM G7 SENSOR) MISC   Other Visit Diagnoses     Loud snoring       Relevant Orders   Ambulatory referral to Neurology         Tollie Eth, DNP, AGNP-c 09/04/2022  5:04 PM    History, Medications, Surgery, SDOH, and Family History reviewed and updated as appropriate.

## 2022-08-30 NOTE — Patient Instructions (Addendum)
If you have any concerns or would like [place] to help you apply the device do not hesitate to let us know.   If the ozempic is causing a reaction, please let me know immediately.   I have sent the referral for the sleep study. You can schedule this when it is convenient.

## 2022-09-04 NOTE — Assessment & Plan Note (Signed)
Chronic. BP well controlled in the office today with no alarm symptoms present. At this time recommend continuation of current medication with tight control of blood sugar and lipids given the associated co-morbid conditions.

## 2022-09-04 NOTE — Assessment & Plan Note (Signed)
Chronic. No alarm sx present at this time. Given the complexity of her multiple co-morbid conditions including CAD, HF, DM, HTN, HLD, and CKD I recommend close blood glucose monitoring and management to ensure that conditions are closely managed. Recent labs reviewed today. Will continue to monitor closely. No medication changes today.

## 2022-09-04 NOTE — Assessment & Plan Note (Signed)
Chronic. Recent labs reviewed today. No changes in medication at this time. Recommend close monitoring of diet and exercise. Continue to follow with cardiology.

## 2022-09-04 NOTE — Assessment & Plan Note (Signed)
No alarm symptoms present at this time. Continue to monitor.

## 2022-09-04 NOTE — Assessment & Plan Note (Signed)
Chronic. Labs stable. No alarm sx present.

## 2022-09-04 NOTE — Assessment & Plan Note (Signed)
Chronic with multiple associated co-morbidities including HTN, HLD, Cardiomyopathy, CAD, CHF, and CKD. Given the complexity of her conditions very tight control of her blood sugar is imperative to reduce risk of worsening conditions. At this time I feel strongly that CGM therapy is most appropriate for her as this has been quite effective, however, she is having interaction with the mounjaro at this time, therefore, we will switch to ozempic to see if this is effective. We will also plan for CGM to help ensure tight control. No alarm sx are present at this time. We will continue to monitor closely.

## 2022-09-04 NOTE — Assessment & Plan Note (Signed)
Chronic and associated with DM, HLD, HTN, DM, and CAD. Labs reviewed today with no alarm sx present. Recommend continued tight BG control and avoidance of nephrotoxic medication. Will continue to monitor closely.

## 2022-09-04 NOTE — Assessment & Plan Note (Signed)
No alarm sx present at this time. Appears stable with no new anginal symptoms. Continue current regimen.

## 2022-09-16 ENCOUNTER — Encounter: Payer: Self-pay | Admitting: Nurse Practitioner

## 2022-09-26 ENCOUNTER — Other Ambulatory Visit: Payer: Self-pay | Admitting: Cardiology

## 2022-09-26 DIAGNOSIS — I5042 Chronic combined systolic (congestive) and diastolic (congestive) heart failure: Secondary | ICD-10-CM

## 2022-10-04 ENCOUNTER — Encounter: Payer: Self-pay | Admitting: Neurology

## 2022-10-04 ENCOUNTER — Ambulatory Visit (INDEPENDENT_AMBULATORY_CARE_PROVIDER_SITE_OTHER): Payer: BC Managed Care – PPO | Admitting: Neurology

## 2022-10-04 VITALS — BP 99/67 | HR 66 | Ht 62.0 in | Wt 188.6 lb

## 2022-10-04 DIAGNOSIS — I5042 Chronic combined systolic (congestive) and diastolic (congestive) heart failure: Secondary | ICD-10-CM

## 2022-10-04 DIAGNOSIS — N1831 Chronic kidney disease, stage 3a: Secondary | ICD-10-CM | POA: Diagnosis not present

## 2022-10-04 DIAGNOSIS — R0681 Apnea, not elsewhere classified: Secondary | ICD-10-CM

## 2022-10-04 DIAGNOSIS — I252 Old myocardial infarction: Secondary | ICD-10-CM | POA: Diagnosis not present

## 2022-10-04 DIAGNOSIS — E669 Obesity, unspecified: Secondary | ICD-10-CM

## 2022-10-04 DIAGNOSIS — G4730 Sleep apnea, unspecified: Secondary | ICD-10-CM

## 2022-10-04 DIAGNOSIS — I25118 Atherosclerotic heart disease of native coronary artery with other forms of angina pectoris: Secondary | ICD-10-CM | POA: Diagnosis not present

## 2022-10-04 DIAGNOSIS — R0683 Snoring: Secondary | ICD-10-CM | POA: Diagnosis not present

## 2022-10-04 DIAGNOSIS — Z9189 Other specified personal risk factors, not elsewhere classified: Secondary | ICD-10-CM

## 2022-10-04 DIAGNOSIS — I428 Other cardiomyopathies: Secondary | ICD-10-CM

## 2022-10-04 NOTE — Patient Instructions (Signed)

## 2022-10-04 NOTE — Progress Notes (Signed)
Subjective:    Patient ID: Andrea Robertson is a 62 y.o. female.  HPI    Star Age, MD, PhD [facility] [address] [place], [place] [address]  Dear [name],  I saw your patient, Andrea Robertson, upon your kind request in my sleep clinic today for initial consultation of her sleep disorder, in particular, concern for underlying obstructive sleep apnea.  The patient is unaccompanied today.  As you know, [name] is a 62 year old female with an underlying complex medical history of diabetes, hypertension, chronic kidney disease, hyperlipidemia, nonischemic cardiomyopathy, coronary artery disease with history of MI, status post stent placement, sickle cell trait, depression, anxiety, reflux disease, allergies, palpitations, and obesity, who reports snoring and excessive daytime somnolence as well as witnessed apneas per husband's report.  Her Epworth sleepiness score is 4 out of 24, fatigue severity score is 14 out of 63.  I reviewed your office note from 08/30/2022.  She had a sleep study several years ago, approximately 15 or 20 years ago which was negative for sleep apnea at the time.  Prior sleep study results are not available for my review today.  She lives with her husband, she has 2 grown children.  She has a dog in the household but the dog is primarily outside.  Bedtime is generally between 8:30 PM and 9 PM but lately it has been closer to 11 or 11:30 PM as she is in school for her business management degree.  She works full-time as an Research scientist (life sciences).  Rise time is generally around 6:30 AM to 7 AM.  She has no recurrent morning headaches, no night to night nocturia.  Her sister has sleep apnea and has a CPAP machine.  Patient cut back on caffeine intake and limits her caffeine to 1 cup of coffee per day generally.  She drinks alcohol rarely, she is a non-smoker.  She does have a TV in her bedroom but it is typically not on at night.  She is a side  sleeper and stomach sleeper, cannot sleep on her back especially flat on her back.  Her Past Medical History Is Significant For: Past Medical History:  Diagnosis Date   AKI (acute kidney injury) ([place]) 03/09/2021   Anxiety    Arthritis    Chronic kidney disease    Coronary artery disease involving native coronary artery of native heart without angina pectoris 07/04/2019   COVID-19 04/19/2022   Depression    Encounter for annual physical exam 05/17/2022   Essential hypertension 10/30/2018   Essential hypertension 10/30/2018   GERD (gastroesophageal reflux disease)    Hyperlipidemia 07/04/2019   Ischemic cardiomyopathy    Myocardial infarction [facility])    Other allergy status, other than to drugs and biological substances 11/10/2020   Palpitations    Perforation of sigmoid colon due to diverticulitis 12/23/2020   Primary localized osteoarthritis of left knee    Primary localized osteoarthritis of left knee    Recurrent major depression in remission ([place]) 11/10/2020   S/P total knee arthroplasty, right    Shortness of breath 03/09/2021   TOA (tubo-ovarian abscess) 12/07/2021    Her Past Surgical History Is Significant For: Past Surgical History:  Procedure Laterality Date   ABDOMINAL HYSTERECTOMY     CHOLECYSTECTOMY     CORONARY STENT INTERVENTION N/A 07/03/2019   Procedure: CORONARY STENT INTERVENTION;  Surgeon: Sherren Mocha, MD;  Location: [facility];  Service: Cardiovascular;  Laterality: N/A;   LEFT HEART  CATH AND CORONARY ANGIOGRAPHY N/A 07/03/2019   Procedure: LEFT HEART CATH AND CORONARY ANGIOGRAPHY;  Surgeon: Sherren Mocha, MD;  Location: [facility];  Service: Cardiovascular;  Laterality: N/A;   RIGHT/LEFT HEART CATH AND CORONARY ANGIOGRAPHY N/A 07/27/2021   Procedure: RIGHT/LEFT HEART CATH AND CORONARY ANGIOGRAPHY;  Surgeon: Adrian Prows, MD;  Location: [facility];  Service: Cardiovascular;  Laterality: N/A;   TOTAL KNEE ARTHROPLASTY Right  10/13/2015   Procedure: RIGHT TOTAL KNEE ARTHROPLASTY;  Surgeon: Latanya Maudlin, MD;  Location: [facility];  Service: Orthopedics;  Laterality: Right;   TOTAL KNEE ARTHROPLASTY Left 11/23/2020   Procedure: TOTAL KNEE ARTHROPLASTY;  Surgeon: Elsie Saas, MD;  Location: [facility];  Service: Orthopedics;  Laterality: Left;    Her Family History Is Significant For: Family History  Problem Relation Age of Onset   Heart disease Mother    Multiple myeloma Mother    Heart attack Father 52   CAD Father    Hypertension Sister    Hypertension Sister    Hypertension Sister    Heart failure Brother    Sleep apnea Neg Hx     Her Social History Is Significant For: Social History   Socioeconomic History   Marital status: Married    Spouse name: Not on file   Number of children: 2   Years of education: Not on file   Highest education level: Not on file  Occupational History   Not on file  Tobacco Use   Smoking status: Never   Smokeless tobacco: Never  Vaping Use   Vaping Use: Never used  Substance and Sexual Activity   Alcohol use: Not Currently    Comment: rare   Drug use: No   Sexual activity: Yes  Other Topics Concern   Not on file  Social History Narrative   Not on file   Social Determinants of Health   Financial Resource Strain: Not on file  Food Insecurity: No Food Insecurity (10/08/2019)   Hunger Vital Sign    Worried About Running Out of Food in the Last Year: Never true    Ran Out of Food in the Last Year: Never true  Transportation Needs: No Transportation Needs (10/08/2019)   PRAPARE - Hydrologist (Medical): No    Lack of Transportation (Non-Medical): No  Physical Activity: Not on file  Stress: No Stress Concern Present (10/08/2019)   [place]    Feeling of Stress : Only a little  Social Connections: Socially Integrated (10/08/2019)   Social Connection and Isolation Panel  [NHANES]    Frequency of Communication with Friends and Family: More than three times a week    Frequency of Social Gatherings with Friends and Family: Three times a week    Attends Religious Services: More than 4 times per year    Active Member of Clubs or Organizations: No    Attends Music therapist: More than 4 times per year    Marital Status: Married    Her Allergies Are:  Allergies  Allergen Reactions   Dilaudid [Hydromorphone Hcl] Hives    Can tolerate if premedicated with benadryl   Erythromycin Hives and Nausea And Vomiting   Percocet [Oxycodone-Acetaminophen] Itching and Nausea And Vomiting    Can tolerate with premedication of diphenhydramine and/or nausea medication   Zithromax [Azithromycin] Nausea And Vomiting   Penicillins Hives, Nausea And Vomiting and Rash   Codeine Nausea Only  Oxycodone Other (See Comments)    Other reaction(s): nausea, epigastric pain   Cefprozil Rash    rash but takes Ceftin   Other Rash and Other (See Comments)    Gel on Ekg pads  :   Her Current Medications Are:  Outpatient Encounter Medications as of 10/04/2022  Medication Sig   amLODipine (NORVASC) 10 MG tablet TAKE 1 TABLET BY MOUTH EVERY DAY (Patient taking differently: Take 5 mg by mouth daily.)   aspirin EC 81 MG EC tablet Take 1 tablet (81 mg total) by mouth daily. (Patient taking differently: Take 81 mg by mouth every morning.)   atorvastatin (LIPITOR) 80 MG tablet Take 1 tablet by mouth daily.   buPROPion (WELLBUTRIN XL) 300 MG 24 hr tablet Take 1 tablet (300 mg total) by mouth daily.   carvedilol (COREG) 25 MG tablet Take 1.5 tablets (37.5 mg total) by mouth 2 (two) times daily.   Continuous Blood Gluc Sensor (DEXCOM G7 SENSOR) MISC Apply new sensor every 10 days. Rotate sites.   dapagliflozin propanediol (FARXIGA) 10 MG TABS tablet Take 1 tablet (10 mg total) by mouth daily before breakfast.   docusate sodium (COLACE) 100 MG capsule Take 100 mg by mouth 2 (two)  times daily.   escitalopram (LEXAPRO) 20 MG tablet Take 1 tablet (20 mg total) by mouth every morning.   ezetimibe (ZETIA) 10 MG tablet TAKE 1 TABLET BY MOUTH EVERY DAY   Multiple Vitamin (MULTIVITAMIN WITH MINERALS) TABS tablet Take 1 tablet by mouth every morning.   sacubitril-valsartan (ENTRESTO) 49-51 MG TAKE 1 TABLET BY MOUTH TWICE A DAY   Semaglutide,0.25 or 0.5MG/DOS, (OZEMPIC, 0.25 OR 0.5 MG/DOSE,) 2 MG/3ML SOPN Inject 0.5 mg into the skin once a week.   Accu-Chek FastClix Lancets MISC USE TO TEST YOUR BLOOD SUGAR TWICE A DAY DX E11.21   fexofenadine (ALLEGRA) 180 MG tablet Take 180 mg by mouth daily as needed for allergies (seasonal allergies). (Patient not taking: Reported on 08/30/2022)   nitroGLYCERIN (NITROSTAT) 0.4 MG SL tablet Place 1 tablet (0.4 mg total) under the tongue every 5 (five) minutes x 3 doses as needed for chest pain. (Patient not taking: Reported on 08/30/2022)   ondansetron (ZOFRAN) 4 MG tablet Take 1 tablet (4 mg total) by mouth every 8 (eight) hours as needed for nausea or vomiting. (Patient not taking: Reported on 08/30/2022)   No facility-administered encounter medications on file as of 10/04/2022.  :   Review of Systems:  Out of a complete 14 point review of systems, all are reviewed and negative with the exception of these symptoms as listed below:          Review of Systems  Neurological:        Pt here for sleep consult Pt snores,hypertension. Pt denies fatigue, CPAP machine, headaches Pt states had sleep study 20 years ago negative for sleep apnea    ESS ;4  FSS :14    Objective:  Neurological Exam  Physical Exam Physical Examination:   Vitals:   10/04/22 0859  BP: 99/67  Pulse: 66    General Examination: The patient is a very pleasant 62 y.o. female in no acute distress. She appears well-developed and well-nourished and well groomed.   HEENT: Normocephalic, atraumatic, pupils are equal, round and reactive to light, extraocular tracking  is good without limitation to gaze excursion or nystagmus noted. Hearing is grossly intact. Face is symmetric with normal facial animation. Speech is clear with no dysarthria noted. There is no hypophonia. There  is no lip, neck/head, jaw or voice tremor. Neck is supple with full range of passive and active motion. There are no carotid bruits on auscultation. Oropharynx exam reveals: Small airway entry, moderate airway crowding secondary to small airway, wider tongue, small area tonsils, uvula not enlarged, Mallampati class III.  Neck circumference 15 1/4 inches.  Minimal overbite noted.  Tongue protrudes centrally and palate elevates symmetrically.   Chest: Clear to auscultation without wheezing, rhonchi or crackles noted.  Heart: S1+S2+0, regular and normal without murmurs, rubs or gallops noted.   Abdomen: Soft, non-tender and non-distended.  Extremities: There is no pitting edema in the distal lower extremities bilaterally.   Skin: Warm and dry without trophic changes noted.   Musculoskeletal: exam reveals no obvious joint deformities.   Neurologically:  Mental status: The patient is awake, alert and oriented in all 4 spheres. Her immediate and remote memory, attention, language skills and fund of knowledge are appropriate. There is no evidence of aphasia, agnosia, apraxia or anomia. Speech is clear with normal prosody and enunciation. Thought process is linear. Mood is normal and affect is normal.  Cranial nerves II - XII are as described above under HEENT exam.  Motor exam: Normal bulk, strength and tone is noted. There is no obvious action or resting tremor.  Fine motor skills and coordination: grossly intact.  Cerebellar testing: No dysmetria or intention tremor. There is no truncal or gait ataxia.  Sensory exam: intact to light touch in the upper and lower extremities.  Gait, station and balance: She stands easily. No veering to one side is noted. No leaning to one side is noted. Posture  is age-appropriate and stance is narrow based. Gait shows normal stride length and normal pace. No problems turning are noted.   Assessment and Plan:  In summary, Kiri Molinaro is a very pleasant 62 y.o.-year old female with an underlying complex medical history of diabetes, hypertension, chronic kidney disease, hyperlipidemia, nonischemic cardiomyopathy, coronary artery disease with history of MI, status post stent placement, sickle cell trait, depression, anxiety, reflux disease, allergies, palpitations, and obesity, whose history and physical exam are concerning for sleep disordered breathing, supporting a current working diagnosis of unspecified sleep apnea, with the main differential diagnoses of obstructive sleep apnea (OSA) versus upper airway resistance syndrome (UARS) versus central sleep apnea (CSA), or mixed sleep apnea. A laboratory attended sleep study is typically considered "gold standard" for evaluation of sleep disordered breathing.   I had a long chat with the patient about my findings and the diagnosis of sleep apnea, particularly OSA, its prognosis and treatment options. We talked about medical/conservative treatments, surgical interventions and non-pharmacological approaches for symptom control. I explained, in particular, the risks and ramifications of untreated moderate to severe OSA, especially with respect to developing cardiovascular disease down the road, including congestive heart failure (CHF), difficult to treat hypertension, cardiac arrhythmias (particularly A-fib), neurovascular complications including TIA, stroke and dementia. Even type 2 diabetes has, in part, been linked to untreated OSA. Symptoms of untreated OSA may include (but may not be limited to) daytime sleepiness, nocturia (i.e. frequent nighttime urination), memory problems, mood irritability and suboptimally controlled or worsening mood disorder such as depression and/or anxiety, lack of energy, lack of  motivation, physical discomfort, as well as recurrent headaches, especially morning or nocturnal headaches. We talked about the importance of maintaining a healthy lifestyle and striving for healthy weight. In addition, we talked about the importance of striving for and maintaining good sleep hygiene. I recommended  a sleep study at this time. I outlined the differences between a laboratory attended sleep study which is considered more comprehensive and accurate over the option of a home sleep test (HST); the latter may lead to underestimation of sleep disordered breathing in some instances and does not help with diagnosing upper airway resistance syndrome and is not accurate enough to diagnose primary central sleep apnea typically. I outlined possible surgical and non-surgical treatment options of OSA, including the use of a positive airway pressure (PAP) device (i.e. CPAP, AutoPAP/APAP or BiPAP in certain circumstances), a custom-made dental device (aka oral appliance, which would require a referral to a specialist dentist or orthodontist typically, and is generally speaking not considered for patients with full dentures or edentulous state), upper airway surgical options, such as traditional UPPP (which is not considered a first-line treatment) or the Inspire device (hypoglossal nerve stimulator, which would involve a referral for consultation with an ENT surgeon, after careful selection, following inclusion criteria - also not first-line treatment). I explained the PAP treatment option to the patient in detail, as this is generally considered first-line treatment.  The patient indicated that she would be willing to try PAP therapy, if the need arises. I explained the importance of being compliant with PAP treatment, not only for insurance purposes but primarily to improve patient's symptoms symptoms, and for the patient's long term health benefit, including to reduce Her cardiovascular risks longer-term.     We will pick up our discussion about the next steps and treatment options after testing.  We will keep her posted as to the test results by phone call and/or MyChart messaging where possible.  We will plan to follow-up in sleep clinic accordingly as well.  I answered all her questions today and the patient was in agreement.   I encouraged her to call with any interim questions, concerns, problems or updates or email [place] through [facility].  Generally speaking, sleep test authorizations may take up to 2 weeks, sometimes less, sometimes longer, the patient is encouraged to get in touch with [place] if they do not hear back from the sleep lab staff directly within the next 2 weeks.  Thank you very much for allowing me to participate in the care of this nice patient. If I can be of any further assistance to you please do not hesitate to call me at [phone].  Sincerely,   Star Age, MD, PhD

## 2022-10-05 LAB — BASIC METABOLIC PANEL
BUN/Creatinine Ratio: 10 (ref 9–23)
BUN: 14 mg/dL (ref 6–24)
CO2: 24 mmol/L (ref 20–29)
Calcium: 9.1 mg/dL (ref 8.7–10.2)
Chloride: 103 mmol/L (ref 96–106)
Creatinine, Ser: 1.37 mg/dL — ABNORMAL HIGH (ref 0.57–1.00)
Glucose: 104 mg/dL — ABNORMAL HIGH (ref 70–99)
Potassium: 4 mmol/L (ref 3.5–5.2)
Sodium: 143 mmol/L (ref 134–144)
eGFR: 44 mL/min/{1.73_m2} — ABNORMAL LOW (ref >59)

## 2022-10-06 ENCOUNTER — Encounter: Payer: Self-pay | Admitting: Cardiology

## 2022-10-06 ENCOUNTER — Ambulatory Visit: Payer: BC Managed Care – PPO | Admitting: Cardiology

## 2022-10-06 VITALS — BP 126/72 | HR 78 | Resp 16 | Ht 62.0 in | Wt 184.0 lb

## 2022-10-06 DIAGNOSIS — I428 Other cardiomyopathies: Secondary | ICD-10-CM

## 2022-10-06 DIAGNOSIS — N1831 Chronic kidney disease, stage 3a: Secondary | ICD-10-CM | POA: Diagnosis not present

## 2022-10-06 DIAGNOSIS — I25118 Atherosclerotic heart disease of native coronary artery with other forms of angina pectoris: Secondary | ICD-10-CM

## 2022-10-06 DIAGNOSIS — I10 Essential (primary) hypertension: Secondary | ICD-10-CM

## 2022-10-06 MED ORDER — NITROGLYCERIN 0.4 MG SL SUBL: 0.4000 mg | SUBLINGUAL_TABLET | SUBLINGUAL | 1 refills | Status: AC | PRN

## 2022-10-06 NOTE — Progress Notes (Signed)
Primary Physician/Referring:  Orma Render, NP  Patient ID: Andrea Robertson, female    DOB: 07/14/1961, 62 y.o.   MRN: [id number]  Chief Complaint  Patient presents with   Congestive Heart Failure   Follow-up    3 month   HPI:    Andrea Robertson  is a 62 y.o. African-American female patient with history of NSTEMI and CAD, LAD stenting in 2020, diet-controlled diabetes mellitus, hypertension, hyperlipidemia, chronic stage III kidney disease probably due to hypertension and diabetes mellitus, new onset of cardiomyopathy with severely reduced LVEF by echocardiogram to 25 to 30%, cardiac catheterization on 07/27/2021 revealing widely patent LAD stent with minimal disease in the other vessels and is on therapy for nonischemic cardiomyopathy.  Patient is here on a 60-monthoffice visit.  She has not had any further episodes of heart failure symptoms including shortness of breath, leg edema, PND or orthopnea.  No chest pain or palpitations or dizziness or syncope.  Tolerating all medications well.  She is being careful with her diet, has also increased physical activity.  Past Medical History:  Diagnosis Date   AKI (acute kidney injury) [place] 03/09/2021   Anxiety    Arthritis    Chronic kidney disease    Coronary artery disease involving native coronary artery of native heart without angina pectoris 07/04/2019   COVID-19 04/19/2022   Depression    Encounter for annual physical exam 05/17/2022   Essential hypertension 10/30/2018   Essential hypertension 10/30/2018   GERD (gastroesophageal reflux disease)    Hyperlipidemia 07/04/2019   Ischemic cardiomyopathy    Myocardial infarction ([facility]    Other allergy status, other than to drugs and biological substances 11/10/2020   Palpitations    Perforation of sigmoid colon due to diverticulitis 12/23/2020   Primary localized osteoarthritis of left knee    Primary localized osteoarthritis of left knee    Recurrent major depression in  remission ([place] 11/10/2020   S/P total knee arthroplasty, right    Shortness of breath 03/09/2021   TOA (tubo-ovarian abscess) 12/07/2021   Family History  Problem Relation Age of Onset   Heart disease Mother    Multiple myeloma Mother    Heart attack Father 575  CAD Father    Hypertension Sister    Hypertension Sister    Hypertension Sister    Heart failure Brother    Sleep apnea Neg Hx     Social History   Tobacco Use   Smoking status: Never   Smokeless tobacco: Never  Substance Use Topics   Alcohol use: Not Currently    Comment: rare   Marital Status: Married  ROS  Review of Systems  Cardiovascular:  Negative for chest pain, dyspnea on exertion (improved), leg swelling, orthopnea, palpitations and paroxysmal nocturnal dyspnea.   Objective  Blood pressure 126/72, pulse 78, resp. rate 16, height 5' 2"$  (1.575 m), weight 184 lb (83.5 kg), SpO2 96 %. Body mass index is 33.65 kg/m.     10/06/2022   10:11 AM 10/04/2022    8:59 AM 08/30/2022    8:21 AM  Vitals with BMI  Height 5' 2"$  5' 2"$  5' 2.5"  Weight 184 lbs 188 lbs 10 oz 186 lbs 6 oz  BMI 33.65 [id number] Systolic [id number] Diastolic 72 67 76  Pulse 78 66 72     Physical Exam Vitals reviewed.  Neck:     Vascular: No carotid bruit or JVD.  Cardiovascular:  Rate and Rhythm: Normal rate and regular rhythm.     Pulses: Intact distal pulses.     Heart sounds: Normal heart sounds. No murmur heard.    No gallop.  Pulmonary:     Effort: Pulmonary effort is normal.     Breath sounds: Normal breath sounds.  Musculoskeletal:     Right lower leg: No edema.     Left lower leg: No edema.     Laboratory examination:   Recent Labs    12/06/21 1654 12/09/21 0450 04/14/22 0848 10/04/22 1019  NA 137 139 141 143  K 3.5 3.6 4.0 4.0  CL 102 108 103 103  CO2 26 24 23 24  $ GLUCOSE 148* 108* 151* 104*  BUN 13 9 16 14  $ CREATININE 1.29* 1.23* 1.44* 1.37*  CALCIUM 9.5 8.6* 8.8 9.1  GFRNONAA 48* 51*  --   --        Latest Ref Rng & Units 10/04/2022   10:19 AM 04/14/2022    8:48 AM 12/09/2021    4:50 AM  CMP  Glucose 70 - 99 mg/dL 104  151  108   BUN 6 - 24 mg/dL 14  16  9   $ Creatinine 0.57 - 1.00 mg/dL 1.37  1.44  1.23   Sodium 134 - 144 mmol/L 143  141  139   Potassium 3.5 - 5.2 mmol/L 4.0  4.0  3.6   Chloride 96 - 106 mmol/L 103  103  108   CO2 20 - 29 mmol/L 24  23  24   $ Calcium 8.7 - 10.2 mg/dL 9.1  8.8  8.6       Latest Ref Rng & Units 04/14/2022    8:48 AM 12/09/2021    4:50 AM 12/08/2021   11:06 AM  CBC  WBC 3.4 - 10.8 x10E3/uL 8.7  10.7  10.9   Hemoglobin 11.1 - 15.9 g/dL 13.3  13.0  13.0   Hematocrit 34.0 - 46.6 % 39.4  37.7  38.7   Platelets 150 - 450 x10E3/uL 266  269  255    Lab Results  Component Value Date   CHOL 135 08/19/2021   HDL 63 08/19/2021   LDLCALC 57 08/19/2021   TRIG 78 08/19/2021   CHOLHDL 4.2 07/04/2019    HEMOGLOBIN A1C Lab Results  Component Value Date   HGBA1C 7.1 (A) 05/17/2022   MPG 148.46 11/11/2020   Lab Results  Component Value Date   TSH 1.360 07/25/2019    Allergies   Allergies  Allergen Reactions   Dilaudid [Hydromorphone Hcl] Hives    Can tolerate if premedicated with benadryl   Erythromycin Hives and Nausea And Vomiting   Percocet [Oxycodone-Acetaminophen] Itching and Nausea And Vomiting    Can tolerate with premedication of diphenhydramine and/or nausea medication   Zithromax [Azithromycin] Nausea And Vomiting   Penicillins Hives, Nausea And Vomiting and Rash   Codeine Nausea Only   Oxycodone Other (See Comments)    Other reaction(s): nausea, epigastric pain   Cefprozil Rash    rash but takes Ceftin   Other Rash and Other (See Comments)    Gel on Ekg pads      Medication list after today's encounter    Current Outpatient Medications:    amLODipine (NORVASC) 10 MG tablet, TAKE 1 TABLET BY MOUTH EVERY DAY (Patient taking differently: Take 5 mg by mouth daily.), Disp: 90 tablet, Rfl: 3   aspirin EC 81 MG EC tablet, Take  1 tablet (81 mg total) by mouth  daily. (Patient taking differently: Take 81 mg by mouth every morning.), Disp: 90 tablet, Rfl: 3   atorvastatin (LIPITOR) 80 MG tablet, Take 1 tablet by mouth daily., Disp: , Rfl:    buPROPion (WELLBUTRIN XL) 300 MG 24 hr tablet, Take 1 tablet (300 mg total) by mouth daily., Disp: 90 tablet, Rfl: 3   carvedilol (COREG) 25 MG tablet, Take 1.5 tablets (37.5 mg total) by mouth 2 (two) times daily., Disp: 270 tablet, Rfl: 3   Continuous Blood Gluc Sensor (DEXCOM G7 SENSOR) MISC, Apply new sensor every 10 days. Rotate sites., Disp: 3 each, Rfl: 11   dapagliflozin propanediol (FARXIGA) 10 MG TABS tablet, Take 1 tablet (10 mg total) by mouth daily before breakfast., Disp: 90 tablet, Rfl: 3   docusate sodium (COLACE) 100 MG capsule, Take 100 mg by mouth 2 (two) times daily., Disp: , Rfl:    escitalopram (LEXAPRO) 20 MG tablet, Take 1 tablet (20 mg total) by mouth every morning., Disp: 90 tablet, Rfl: 3   ezetimibe (ZETIA) 10 MG tablet, TAKE 1 TABLET BY MOUTH EVERY DAY, Disp: 90 tablet, Rfl: 3   Multiple Vitamin (MULTIVITAMIN WITH MINERALS) TABS tablet, Take 1 tablet by mouth every morning., Disp: , Rfl:    sacubitril-valsartan (ENTRESTO) 49-51 MG, TAKE 1 TABLET BY MOUTH TWICE A DAY, Disp: 180 tablet, Rfl: 2   Semaglutide,0.25 or 0.5MG/DOS, (OZEMPIC, 0.25 OR 0.5 MG/DOSE,) 2 MG/3ML SOPN, Inject 0.5 mg into the skin once a week., Disp: 3 mL, Rfl: 0   nitroGLYCERIN (NITROSTAT) 0.4 MG SL tablet, Place 1 tablet (0.4 mg total) under the tongue every 5 (five) minutes x 3 doses as needed for chest pain., Disp: 25 tablet, Rfl: 1  Radiology:   No results found.  Cardiac Studies:   PCV MYOCARDIAL PERFUSION WO LEXISCAN 06/21/2021 Lexiscan/modified Bruce nuclear stress test performed using 1-day protocol. Patient reached 7 METS, 64% MPHR. In addition, stress EKG showed sinus tachycardia, inferolateral infarct, no acute ischemic changes. SPECT Images showed medium sized, severe  intensity, predominantly fixed perfusion defect with minimal peri-infarct ischemia in apical to basal, inferior/inferoseptal myocardium, associated with absent myocardial thickening. Stress LVEF 35%. Compared to the study done on 06/2020, no significant change.  Previously noted inferior wall scar is unchanged. Anterior and anterolateral scar with minimal reversible defect not noted in the present study. EF was previously calculated at 44%.  High risk study.   Left and right heart Catheterization 07/27/21:  RA: 11/10, mean 8, RA saturation 65% RV: 28/-3, EDP 4 mmHg PA 28/3, mean 16 mmHg.  PA saturation 67%. PW 8/8, mean 5 mmHg.  Aortic saturation 90% QP/QS 1.01, left >right shunt 0.48, 8%, not significant. CO 5.5, CI 2.99, normal. LV: 107/0, EDP 12 mmHg.  Ao 108/60, mean 82 mmHg.  No pressure gradient across the aortic valve.  LV: Distal anterior, apical and inferoapical aneurysm formation.  LVEF 40%.  No significant MR. RCA: Minimal disease, dominant. LM: Large vessel.  Smooth and normal. LAD: Large vessel.  It gives origin to a moderate-sized D1 and D2, D1 has ostial 50 to 60% stenosis.  Mid LAD stent 2.5 x 20 mm Synergy placed 07/03/2019 for NSTEMI widely patent. LCx: Large vessel, smooth and normal.  Impression: Patient has ischemic cardiomyopathy with apical dyskinesis.  Preserved cardiac output and cardiac index.  There is insignificant right to left shunting.  90 mL contrast utilized. Patient needs continued aggressive risk modification and medical therapy.     Echocardiogram 06/30/2022: Mildly depressed LV systolic function with visual  EF 40-45%. Left ventricle cavity is normal in size. Normal left ventricular wall thickness. Doppler evidence of grade I (impaired) diastolic dysfunction, normal LAP. Left ventricle regional wall motion findings: Mid anteroseptal, Apical anterior, Apical lateral, Apical inferior, Apical septal and Apical cap hypokinesis. Calculated EF 46%. Structurally  normal trileaflet aortic valve.  Trace aortic regurgitation. Structurally normal tricuspid valve with trace regurgitation. No evidence of pulmonary hypertension. Compared to 11/2021, EF improved from 30-35% to 40-45%.   EKG:   EKG 10/06/2022: Normal sinus rhythm with rate of 62 bpm, question if she is left atrial abnormality normal axis.  Cannot exclude inferior infarct old.  Poor R progression, cannot exclude anteroseptal infarct old.  Nonspecific T abnormality.  Compared to 07/07/2022, leftward axis is new.  Assessment     ICD-10-CM   1. Non-ischemic cardiomyopathy ([place])  I42.8 PCV ECHOCARDIOGRAM COMPLETE    2. Coronary artery disease of native artery of native heart with stable angina pectoris (HCC)  I25.118 EKG 12-Lead    PCV ECHOCARDIOGRAM COMPLETE    nitroGLYCERIN (NITROSTAT) 0.4 MG SL tablet    3. Primary hypertension  I10     4. Stage 3a chronic kidney disease (HCC)  N18.31        Medications Discontinued During This Encounter  Medication Reason   ondansetron (ZOFRAN) 4 MG tablet Patient Preference   Accu-Chek FastClix Lancets MISC Change in therapy   fexofenadine (ALLEGRA) 180 MG tablet No longer needed (for PRN medications)   nitroGLYCERIN (NITROSTAT) 0.4 MG SL tablet Reorder      Meds ordered this encounter  Medications   nitroGLYCERIN (NITROSTAT) 0.4 MG SL tablet    Sig: Place 1 tablet (0.4 mg total) under the tongue every 5 (five) minutes x 3 doses as needed for chest pain.    Dispense:  25 tablet    Refill:  1    Orders Placed This Encounter  Procedures   EKG 12-Lead   PCV ECHOCARDIOGRAM COMPLETE    Standing Status:   Future    Standing Expiration Date:   10/07/2023   Recommendations:   Andrea Robertson is a 62 y.o. African-American female patient with history of NSTEMI and CAD, LAD stenting in 2020, diet-controlled diabetes mellitus, hypertension, hyperlipidemia, chronic stage III kidney disease probably due to hypertension and diabetes mellitus, new  onset of cardiomyopathy with severely reduced LVEF by echocardiogram to 25 to 30%, cardiac catheterization on 07/27/2021 revealing widely patent LAD stent with minimal disease in the other vessels and is on therapy for nonischemic cardiomyopathy.  1.  Nonischemic cardiomyopathy Patient's LVEF has now improved to >40% and presently chronic combined systolic and diastolic heart failure is now resolved.  She is presently on moderate dose of Entresto in view of renal insufficiency.  Continue the same.  No clinical evidence of heart failure.  2. Coronary artery disease of native artery of native heart with stable angina pectoris [facility]) Coronary artery disease standpoint she is remained stable without recent angina pectoris.  No changes in the EKG.  3. Primary hypertension Blood pressure is now well-controlled.  She is only on 5 mg amlodipine, advised her to continue the same for now as her blood pressure is soft.  I suspect with weight loss, she may be able to come off of amlodipine.  She is now on Ozempic.  I expect her to lose weight.  I have discussed with her regarding eating slow and also taking a break in between the meal to improve satiety.  4. Stage 3a chronic  kidney disease ([place]) Renal function has remained stable with stage IIIa chronic kidney disease.  She is now being evaluated for sleep apnea as well and was evaluated by Dr. Rexene Alberts on 10/04/2022, I have encouraged her to follow through with this and the importance of treating sleep apnea if present discussed with the patient.  I will see her back in 6 months with repeat echocardiogram.   Adrian Prows, MD, [facility] 10/06/2022, 10:42 AM Office: [phone] Fax: [phone] Pager: [phone]

## 2022-10-09 ENCOUNTER — Encounter: Payer: Self-pay | Admitting: Nurse Practitioner

## 2022-10-09 DIAGNOSIS — E1122 Type 2 diabetes mellitus with diabetic chronic kidney disease: Secondary | ICD-10-CM

## 2022-10-09 DIAGNOSIS — I129 Hypertensive chronic kidney disease with stage 1 through stage 4 chronic kidney disease, or unspecified chronic kidney disease: Secondary | ICD-10-CM

## 2022-10-09 DIAGNOSIS — I428 Other cardiomyopathies: Secondary | ICD-10-CM

## 2022-10-09 DIAGNOSIS — I252 Old myocardial infarction: Secondary | ICD-10-CM

## 2022-10-09 DIAGNOSIS — E1169 Type 2 diabetes mellitus with other specified complication: Secondary | ICD-10-CM

## 2022-10-09 DIAGNOSIS — I5042 Chronic combined systolic (congestive) and diastolic (congestive) heart failure: Secondary | ICD-10-CM

## 2022-10-09 DIAGNOSIS — I25118 Atherosclerotic heart disease of native coronary artery with other forms of angina pectoris: Secondary | ICD-10-CM

## 2022-10-09 DIAGNOSIS — N1831 Chronic kidney disease, stage 3a: Secondary | ICD-10-CM

## 2022-10-11 DIAGNOSIS — S46012D Strain of muscle(s) and tendon(s) of the rotator cuff of left shoulder, subsequent encounter: Secondary | ICD-10-CM | POA: Diagnosis not present

## 2022-10-11 DIAGNOSIS — F331 Major depressive disorder, recurrent, moderate: Secondary | ICD-10-CM | POA: Diagnosis not present

## 2022-10-11 MED ORDER — SEMAGLUTIDE (1 MG/DOSE) 4 MG/3ML ~~LOC~~ SOPN: 1.0000 mg | PEN_INJECTOR | SUBCUTANEOUS | 3 refills | Status: DC

## 2022-10-13 ENCOUNTER — Telehealth: Payer: Self-pay | Admitting: Nurse Practitioner

## 2022-10-13 NOTE — Telephone Encounter (Signed)
Pt left message late yesterday that Lantana wasn't at the pharmacy, & she hadn't heard back from [place] message.  I called pharmacy & pt picked up yesterday went thru ins & was $200.  Called pt and informed of discount card online to help with cost and also that [name] sent her mychart message 2 days ago.

## 2022-10-20 ENCOUNTER — Encounter: Payer: Self-pay | Admitting: Cardiology

## 2022-10-20 DIAGNOSIS — F331 Major depressive disorder, recurrent, moderate: Secondary | ICD-10-CM | POA: Diagnosis not present

## 2022-10-25 ENCOUNTER — Ambulatory Visit: Payer: BC Managed Care – PPO | Admitting: Nurse Practitioner

## 2022-10-25 ENCOUNTER — Encounter: Payer: Self-pay | Admitting: Nurse Practitioner

## 2022-10-25 VITALS — BP 122/82 | HR 78 | Wt 182.2 lb

## 2022-10-25 DIAGNOSIS — E1122 Type 2 diabetes mellitus with diabetic chronic kidney disease: Secondary | ICD-10-CM | POA: Diagnosis not present

## 2022-10-25 DIAGNOSIS — M25512 Pain in left shoulder: Secondary | ICD-10-CM | POA: Insufficient documentation

## 2022-10-25 DIAGNOSIS — F329 Major depressive disorder, single episode, unspecified: Secondary | ICD-10-CM

## 2022-10-25 DIAGNOSIS — I129 Hypertensive chronic kidney disease with stage 1 through stage 4 chronic kidney disease, or unspecified chronic kidney disease: Secondary | ICD-10-CM

## 2022-10-25 DIAGNOSIS — N183 Chronic kidney disease, stage 3 unspecified: Secondary | ICD-10-CM

## 2022-10-25 DIAGNOSIS — F331 Major depressive disorder, recurrent, moderate: Secondary | ICD-10-CM | POA: Diagnosis not present

## 2022-10-25 DIAGNOSIS — Z23 Encounter for immunization: Secondary | ICD-10-CM

## 2022-10-25 DIAGNOSIS — N1831 Chronic kidney disease, stage 3a: Secondary | ICD-10-CM | POA: Diagnosis not present

## 2022-10-25 NOTE — Assessment & Plan Note (Signed)
Currently on Farxiga. Labs reportedly normal with Dr. Nadyne Coombes.  Plan: - Continue Wilder Glade - Will review labs and make suggestions for changes as needed.

## 2022-10-25 NOTE — Assessment & Plan Note (Signed)
The patient is managing diabetes with Ozempic and [place]. Recent blood work indicates satisfactory control per patient. . Hydration is adequate. Dexcom readings show an average blood sugar of 128 and 92% time in range, reflecting good glycemic control. The patient has experienced weight loss, which is wonderful. I will need to get copies of the labs to review, we will hold off on repeat labs today.  Plan: - Continue with the current diabetes medications, Ozempic and Farxiga. - Follow up with Dr. Einar Gip as recommended for ongoing management of cardiac care. - We will plan to follow-up in 6 months with a CPE, I would like to stagger these appointments with Dr. Mila Palmer appointments, if possible.

## 2022-10-25 NOTE — Progress Notes (Signed)
Andrea Keeler, DNP, AGNP-c [place]  [address] [place], [place] [address] [phone]  ESTABLISHED PATIENT- Chronic Health and/or Follow-Up Visit  Blood pressure 122/82, pulse 78, weight 182 lb 3.2 oz (82.6 kg).    Andrea Robertson is a 62 y.o. year old female presenting today for evaluation and management of the following:  The patient presents today for a follow-up regarding her diabetes management and overall well-being. She reports an increase in depression symptoms, attributing this change to stress at work. She has recently quit her job and has begun therapy sessions. Additionally, she has completed her bachelor's degree and intends to re-enter the workforce in the future. She acknowledges the emotional impact of her mother's passing in January 2022 and expresses optimism about the benefits of ongoing therapy. She feels these changes will be beneficial in the long run.   Her current medication regimen for diabetes includes Ozempic. Dr. Einar Gip has completed her blood work recently and she reports findings were normal. Her blood pressure remains well-controlled, and she continues to take [place], reporting no issues with urinary symptoms or vaginal itching. She utilizes Dexcom, which shows an average blood sugar level of 128 and 92% time in range, reflecting good glycemic control. She has observed some recent weight loss and is pleased with this.   She has recently restarted allergy medications for seasonal allergies.   Currently, she does not need any medication refills and is scheduled for a follow-up appointment with Dr. Einar Gip in six months.  Regarding her musculoskeletal health, the patient had a shoulder injury a few years back and was informed that surgery might be necessary. However, she reports improvement in her shoulder condition over the past three weeks and wishes to delay any surgical intervention. Her medical history includes previous knee surgeries  and a surgery on the opposite shoulder. She is ok with me completing the surgical clearance paperwork just in case she changes her mind on the procedure.   All ROS negative with exception of what is listed above.   PHYSICAL EXAM Physical Exam Vitals and nursing note reviewed.  Constitutional:      General: She is not in acute distress.    Appearance: Normal appearance.  HENT:     Head: Normocephalic.  Eyes:     Extraocular Movements: Extraocular movements intact.     Pupils: Pupils are equal, round, and reactive to light.  Neck:     Vascular: No carotid bruit.  Cardiovascular:     Rate and Rhythm: Normal rate and regular rhythm.     Pulses: Normal pulses.     Heart sounds: Normal heart sounds. No murmur heard. Pulmonary:     Effort: Pulmonary effort is normal.     Breath sounds: Normal breath sounds. No wheezing.  Abdominal:     General: Bowel sounds are normal. There is no distension.     Palpations: Abdomen is soft.     Tenderness: There is no abdominal tenderness. There is no right CVA tenderness, left CVA tenderness or guarding.  Musculoskeletal:        General: Normal range of motion.     Cervical back: Normal range of motion and neck supple.     Right lower leg: No edema.     Left lower leg: No edema.  Lymphadenopathy:     Cervical: No cervical adenopathy.  Skin:    General: Skin is warm and dry.     Capillary Refill: Capillary refill takes less than 2 seconds.  Neurological:  General: No focal deficit present.     Mental Status: She is alert and oriented to person, place, and time.  Psychiatric:        Mood and Affect: Mood normal.        Behavior: Behavior normal.        Thought Content: Thought content normal.        Judgment: Judgment normal.     PLAN Problem List Items Addressed This Visit     Stage 3a chronic kidney disease (CKD) ([place])    Currently on Farxiga. Labs reportedly normal with Dr. Nadyne Coombes.  Plan: - Continue Wilder Glade - Will review labs and  make suggestions for changes as needed.       Type 2 diabetes mellitus with stage 3a chronic kidney disease, without long-term current use of insulin ([place])    The patient is managing diabetes with Ozempic and [place]. Recent blood work indicates satisfactory control per patient. . Hydration is adequate. Dexcom readings show an average blood sugar of 128 and 92% time in range, reflecting good glycemic control. The patient has experienced weight loss, which is wonderful. I will need to get copies of the labs to review, we will hold off on repeat labs today.  Plan: - Continue with the current diabetes medications, Ozempic and Farxiga. - Follow up with Dr. Einar Gip as recommended for ongoing management of cardiac care. - We will plan to follow-up in 6 months with a CPE, I would like to stagger these appointments with Dr. Mila Palmer appointments, if possible.       Hypertension associated with stage 3 chronic kidney disease due to type 2 diabetes mellitus ([place])    Blood pressure is well controlled today on current medication. She is followed closely with Dr. Nadyne Coombes. Most recent labs were normal.  Plan: - Continue entresto, zetia, farxiga, coreg, lipitor, and norvasc for cardiovascular protection.  - Follow up with Dr. Nadyne Coombes as directed.       Reactive depression (situational)    The patient reports increased stress at work leading to worsening depression. The patient has quit her job and is currently feeling better. Therapy has been initiated and is perceived as beneficial. She has had a reduction in stressors with completion of school, as well. She appears to be dealing with the stressors well.  Plan: - Maintain current antidepressant regimen with escitalopram and bupropion.  - Continue therapy sessions. - Incorporate 3 good things into your daily regimen.  - Monitor mood and report immediately if worsening symptoms present.  - Consider Vitamin D supplementation at 2,000 to 4,000 IU daily to  potentially aid in mood improvement.      Left shoulder pain    The patient has a history of a minor shoulder tear and recent exacerbation of pain after lifting a heavy object. Pain has improved over the last three weeks, and the patient prefers to delay potential surgery. I have received the surgical clearance paperwork.  Plan: - Monitor the patient's shoulder pain and function. - Postpone surgery according to the patient's wishes. - Prepare and submit any necessary pre-authorization paperwork for potential surgery to have on file.      Other Visit Diagnoses     Need for vaccination against Streptococcus pneumoniae    -  Primary       Return for later this year for physical.   Andrea Keeler, DNP, AGNP-c 10/25/2022  8:38 AM

## 2022-10-25 NOTE — Patient Instructions (Signed)
Please let me know if you have a dip in your mood.   We can plan to follow-up with your regular physical later in the year since you are seeing Dr. Jenkins Rouge in 6 months.     Vitamin D 2000-4000 iU daily.

## 2022-10-25 NOTE — Assessment & Plan Note (Signed)
The patient has a history of a minor shoulder tear and recent exacerbation of pain after lifting a heavy object. Pain has improved over the last three weeks, and the patient prefers to delay potential surgery. I have received the surgical clearance paperwork.  Plan: - Monitor the patient's shoulder pain and function. - Postpone surgery according to the patient's wishes. - Prepare and submit any necessary pre-authorization paperwork for potential surgery to have on file.

## 2022-10-25 NOTE — Assessment & Plan Note (Signed)
Blood pressure is well controlled today on current medication. She is followed closely with Dr. Nadyne Coombes. Most recent labs were normal.  Plan: - Continue entresto, zetia, farxiga, coreg, lipitor, and norvasc for cardiovascular protection.  - Follow up with Dr. Nadyne Coombes as directed.

## 2022-10-25 NOTE — Assessment & Plan Note (Signed)
The patient reports increased stress at work leading to worsening depression. The patient has quit her job and is currently feeling better. Therapy has been initiated and is perceived as beneficial. She has had a reduction in stressors with completion of school, as well. She appears to be dealing with the stressors well.  Plan: - Maintain current antidepressant regimen with escitalopram and bupropion.  - Continue therapy sessions. - Incorporate 3 good things into your daily regimen.  - Monitor mood and report immediately if worsening symptoms present.  - Consider Vitamin D supplementation at 2,000 to 4,000 IU daily to potentially aid in mood improvement.

## 2022-11-08 DIAGNOSIS — F331 Major depressive disorder, recurrent, moderate: Secondary | ICD-10-CM | POA: Diagnosis not present

## 2022-11-10 ENCOUNTER — Telehealth: Payer: Self-pay | Admitting: Neurology

## 2022-11-10 NOTE — Telephone Encounter (Signed)
[employer] no auth req for either sleep studies spoke to [place] ref # [id number]    Sent mychart message

## 2022-11-21 LAB — HM DIABETES EYE EXAM

## 2022-11-28 NOTE — Telephone Encounter (Signed)
I spoke with the patient. She requested to have the in lab.  NPSG- [employer] no auth req spoke to [place] ref # [id number]   She is scheduled for 01/24/23 at 8 pm.  Mailed packet to the patient.

## 2022-11-29 ENCOUNTER — Other Ambulatory Visit: Payer: Self-pay | Admitting: Cardiology

## 2022-11-29 DIAGNOSIS — I5042 Chronic combined systolic (congestive) and diastolic (congestive) heart failure: Secondary | ICD-10-CM

## 2022-11-29 DIAGNOSIS — I428 Other cardiomyopathies: Secondary | ICD-10-CM

## 2022-12-01 NOTE — Progress Notes (Signed)
Patient's chart and most recent cardiac notes and testing reviewed with Dr Casilda Carls, [place] for [facility].

## 2022-12-02 ENCOUNTER — Encounter (HOSPITAL_BASED_OUTPATIENT_CLINIC_OR_DEPARTMENT_OTHER): Payer: Self-pay | Admitting: Orthopaedic Surgery

## 2022-12-02 ENCOUNTER — Other Ambulatory Visit: Payer: Self-pay

## 2022-12-02 NOTE — Progress Notes (Signed)
   12/02/22 1005  Pre-op Phone Call  Surgery Date Verified 12/08/22  Arrival Time Verified 0630  Surgery Location Verified [facility] [place]  Medical History Reviewed Yes  Is the patient taking a GLP-1 receptor agonist? (S)  Yes (Ozempic LD 11-27-22)  Has the patient been informed on holding medication? Yes  Does the patient have diabetes? Type II  Does the patient use a Continuous Blood Glucose Monitor? (S)  Yes (removed completely for surgery)  Is the patient on an insulin pump? No  Has the diabetes coordinator been notified? No  Do you have a history of heart problems? (S)  Yes (non ischemic cardiomyopathy)  Cardiologist Name Dr Jacinto Halim  Have you ever had tests on your heart? Yes  What cardiac tests were performed? Cardiac Cath;Echo;Stress Test  What date/year were cardiac tests completed? 06-30-22 ECHO EF 40-45%  Results viewable: [facility] Media Tab;Paper Copy in Chart  Antiarrhythmic device type  (na)  Does patient have other implanted devices? No  Patient Teaching Pre / Post Procedure  Patient educated about smoking cessation 24 hours prior to surgery. N/A Non-Smoker  Patient verbalizes understanding of bowel prep? N/A  Med Rec Completed Yes  Take the Following Meds the Morning of Surgery take amlodipine, wellbutin and lexapro with sip water, stop asa and mvi 5 day prior sx  Recent  Lab Work, EKG, CXR? Yes  NPO (Including gum & candy) After midnight  Stop Solids, Milk, Candy, and Gum STARTING AT MIDNIGHT  Responsible adult to drive and be with you for 24 hours? Yes  Name & Phone Number for Ride/Caregiver husband marvin  No Jewelry, money, nail polish or make-up.  No lotions, powders, perfumes. No shaving  48 hrs. prior to surgery. Yes  Contacts, Dentures & Glasses Will Have to be Removed Before OR. Yes  Please bring your ID and Insurance Card the morning of your surgery. (Surgery Centers Only) Yes  Bring any papers or x-rays with you that your surgeon gave you. Yes  Instructed to contact the  location of procedure/ provider if they or anyone in their household develops symptoms or tests positive for COVID-19, has close contact with someone who tests positive for COVID, or has known exposure to any contagious illness. Yes  Call this number the morning of surgery  with any problems that may cancel your surgery. [phone]  Covid-19 Assessment  Have you had a positive COVID-19 test within the previous 90 days? No  COVID Testing Guidance Proceed with the additional questions.

## 2022-12-02 NOTE — Progress Notes (Signed)
   12/02/22 1005  PAT Phone Screen  Is the patient taking a GLP-1 receptor agonist? (S)  Yes (Ozempic LD 11-27-22)  Has the patient been informed on holding medication? Yes  Do You Have Diabetes? Yes  Do You Have Hypertension? Yes  Have You Ever Been to the ER for Asthma? No  Have You Taken Oral Steroids in the Past 3 Months? No  Do you Take Phenteramine or any Other Diet Drugs? No  Recent  Lab Work, EKG, CXR? Yes  Where was this test performed? 10-06-22 EKG  Do you have a history of heart problems? (S)  Yes (non ischemic cardiomyopathy)  Cardiologist Name Dr Jacinto Halim  Have you ever had tests on your heart? Yes  What cardiac tests were performed? Cardiac Cath;Echo;Stress Test  What date/year were cardiac tests completed? 06-30-22 ECHO EF 40-45%  Results viewable: [facility] Media Tab;Paper Copy in Chart  Any Recent Hospitalizations? No  Height 5\' 2"  (1.575 m)  Weight 81.6 kg  Pat Appointment Scheduled (S)  Yes (BMP)

## 2022-12-05 DIAGNOSIS — Z6833 Body mass index (BMI) 33.0-33.9, adult: Secondary | ICD-10-CM | POA: Diagnosis not present

## 2022-12-05 DIAGNOSIS — Z1231 Encounter for screening mammogram for malignant neoplasm of breast: Secondary | ICD-10-CM | POA: Diagnosis not present

## 2022-12-05 DIAGNOSIS — Z01419 Encounter for gynecological examination (general) (routine) without abnormal findings: Secondary | ICD-10-CM | POA: Diagnosis not present

## 2022-12-06 ENCOUNTER — Encounter (HOSPITAL_BASED_OUTPATIENT_CLINIC_OR_DEPARTMENT_OTHER)
Admission: RE | Admit: 2022-12-06 | Discharge: 2022-12-06 | Disposition: A | Discharge status: Home / Self Care | Payer: BC Managed Care – PPO | Source: Ambulatory Visit | Attending: Orthopaedic Surgery | Admitting: Orthopaedic Surgery

## 2022-12-06 DIAGNOSIS — Z8249 Family history of ischemic heart disease and other diseases of the circulatory system: Secondary | ICD-10-CM | POA: Diagnosis not present

## 2022-12-06 DIAGNOSIS — S46012A Strain of muscle(s) and tendon(s) of the rotator cuff of left shoulder, initial encounter: Secondary | ICD-10-CM | POA: Diagnosis not present

## 2022-12-06 DIAGNOSIS — I509 Heart failure, unspecified: Secondary | ICD-10-CM | POA: Diagnosis not present

## 2022-12-06 DIAGNOSIS — E1122 Type 2 diabetes mellitus with diabetic chronic kidney disease: Secondary | ICD-10-CM | POA: Diagnosis not present

## 2022-12-06 DIAGNOSIS — I2510 Atherosclerotic heart disease of native coronary artery without angina pectoris: Secondary | ICD-10-CM | POA: Diagnosis not present

## 2022-12-06 DIAGNOSIS — Z7984 Long term (current) use of oral hypoglycemic drugs: Secondary | ICD-10-CM | POA: Diagnosis not present

## 2022-12-06 DIAGNOSIS — I130 Hypertensive heart and chronic kidney disease with heart failure and stage 1 through stage 4 chronic kidney disease, or unspecified chronic kidney disease: Secondary | ICD-10-CM | POA: Diagnosis not present

## 2022-12-06 DIAGNOSIS — Z955 Presence of coronary angioplasty implant and graft: Secondary | ICD-10-CM | POA: Diagnosis not present

## 2022-12-06 DIAGNOSIS — Z01812 Encounter for preprocedural laboratory examination: Secondary | ICD-10-CM | POA: Insufficient documentation

## 2022-12-06 DIAGNOSIS — M7542 Impingement syndrome of left shoulder: Secondary | ICD-10-CM | POA: Diagnosis not present

## 2022-12-06 DIAGNOSIS — X58XXXA Exposure to other specified factors, initial encounter: Secondary | ICD-10-CM | POA: Diagnosis not present

## 2022-12-06 DIAGNOSIS — M7522 Bicipital tendinitis, left shoulder: Secondary | ICD-10-CM | POA: Diagnosis not present

## 2022-12-06 DIAGNOSIS — I252 Old myocardial infarction: Secondary | ICD-10-CM | POA: Diagnosis not present

## 2022-12-06 DIAGNOSIS — N1831 Chronic kidney disease, stage 3a: Secondary | ICD-10-CM | POA: Diagnosis not present

## 2022-12-06 LAB — BASIC METABOLIC PANEL
Anion gap: 11 (ref 5–15)
BUN: 16 mg/dL (ref 6–20)
CO2: 24 mmol/L (ref 22–32)
Calcium: 9 mg/dL (ref 8.9–10.3)
Chloride: 101 mmol/L (ref 98–111)
Creatinine, Ser: 1.33 mg/dL — ABNORMAL HIGH (ref 0.44–1.00)
GFR, Estimated: 46 mL/min — ABNORMAL LOW (ref >60)
Glucose, Bld: 108 mg/dL — ABNORMAL HIGH (ref 70–99)
Potassium: 4.7 mmol/L (ref 3.5–5.1)
Sodium: 136 mmol/L (ref 135–145)

## 2022-12-06 NOTE — Progress Notes (Signed)
Surgical soap given with instructions, pt verbalized understanding. Benzoyl peroxide gel given with instructions, pt verbalized understanding.  

## 2022-12-07 MED ORDER — LEVOFLOXACIN IN D5W 500 MG/100ML IV SOLN
500.0000 mg | INTRAVENOUS | Status: AC
  Administered 2022-12-08: 500 mg via INTRAVENOUS
  Filled 2022-12-07: qty 100

## 2022-12-07 NOTE — H&P (Signed)
PREOPERATIVE H&P  Chief Complaint: LEFT SHOULDER CARTILAGE DISORDER, IMPINGEMENT SYNDROME, BICEPS TEDINITIS, ROTATOR CUFF TEAR  HPI: Andrea Robertson is a 62 y.o. female who is scheduled for, Procedure(s): SHOULDER ARTHROSCOPY WITH SUBACROMIAL DECOMPRESSION, ROTATOR CUFF REPAIR AND BICEP TENDON REPAIR.   Patient has a past medical history significant for CAD, HTN, HLD, MI, CHF.   She had a medium sized rotator cuff tear about 14 months ago, seen on the MRI.  She has been managing non-operatively. Unfortunately her symptoms have changed. She continues to have pain.  She wants to know if she has new options. She had a total knee since her last surgery with me. She has a diagnosis of congestive heart failure with an EF of 55.    Symptoms are rated as moderate to severe, and have been worsening.  This is significantly impairing activities of daily living.    Please see clinic note for further details on this patient's care.    She has elected for surgical management.   Past Medical History:  Diagnosis Date   AKI (acute kidney injury) 03/09/2021   Anxiety    Arthritis    Chronic kidney disease    Coronary artery disease involving native coronary artery of native heart without angina pectoris 07/04/2019   COVID-19 04/19/2022   Depression    Diabetes mellitus without complication    Encounter for annual physical exam 05/17/2022   Essential hypertension 10/30/2018   Essential hypertension 10/30/2018   Hyperlipidemia 07/04/2019   Ischemic cardiomyopathy    Myocardial infarction    Other allergy status, other than to drugs and biological substances 11/10/2020   Palpitations    Perforation of sigmoid colon due to diverticulitis 12/23/2020   Primary localized osteoarthritis of left knee    Primary localized osteoarthritis of left knee    Recurrent major depression in remission 11/10/2020   S/P total knee arthroplasty, right    Shortness of breath 03/09/2021   TOA (tubo-ovarian  abscess) 12/07/2021   Past Surgical History:  Procedure Laterality Date   ABDOMINAL HYSTERECTOMY     CHOLECYSTECTOMY     CORONARY STENT INTERVENTION N/A 07/03/2019   Procedure: CORONARY STENT INTERVENTION;  Surgeon: Tonny Bollman, MD;  Location: [facility] INVASIVE CV LAB;  Service: Cardiovascular;  Laterality: N/A;   LEFT HEART CATH AND CORONARY ANGIOGRAPHY N/A 07/03/2019   Procedure: LEFT HEART CATH AND CORONARY ANGIOGRAPHY;  Surgeon: Tonny Bollman, MD;  Location: [facility] INVASIVE CV LAB;  Service: Cardiovascular;  Laterality: N/A;   RIGHT/LEFT HEART CATH AND CORONARY ANGIOGRAPHY N/A 07/27/2021   Procedure: RIGHT/LEFT HEART CATH AND CORONARY ANGIOGRAPHY;  Surgeon: Yates Decamp, MD;  Location: [facility] INVASIVE CV LAB;  Service: Cardiovascular;  Laterality: N/A;   TOTAL KNEE ARTHROPLASTY Right 10/13/2015   Procedure: RIGHT TOTAL KNEE ARTHROPLASTY;  Surgeon: Ranee Gosselin, MD;  Location: [facility] ORS;  Service: Orthopedics;  Laterality: Right;   TOTAL KNEE ARTHROPLASTY Left 11/23/2020   Procedure: TOTAL KNEE ARTHROPLASTY;  Surgeon: Salvatore Marvel, MD;  Location: [facility] ORS;  Service: Orthopedics;  Laterality: Left;   Social History   Socioeconomic History   Marital status: Married    Spouse name: Not on file   Number of children: 2   Years of education: Not on file   Highest education level: Not on file  Occupational History   Not on file  Tobacco Use   Smoking status: Never   Smokeless tobacco: Never  Vaping Use   Vaping Use: Never used  Substance and Sexual Activity   Alcohol  use: Yes    Comment: occ   Drug use: No   Sexual activity: Yes    Birth control/protection: Surgical    Comment: Hyst  Other Topics Concern   Not on file  Social History Narrative   Not on file   Social Determinants of Health   Financial Resource Strain: Not on file  Food Insecurity: No Food Insecurity (10/08/2019)   Hunger Vital Sign    Worried About Running Out of Food in the Last Year: Never true    Ran Out of Food in  the Last Year: Never true  Transportation Needs: No Transportation Needs (10/08/2019)   PRAPARE - Administrator, Civil Service (Medical): No    Lack of Transportation (Non-Medical): No  Physical Activity: Not on file  Stress: No Stress Concern Present (10/08/2019)   [employer] of Occupational Health - Occupational Stress Questionnaire    Feeling of Stress : Only a little  Social Connections: Socially Integrated (10/08/2019)   Social Connection and Isolation Panel [NHANES]    Frequency of Communication with Friends and Family: More than three times a week    Frequency of Social Gatherings with Friends and Family: Three times a week    Attends Religious Services: More than 4 times per year    Active Member of Clubs or Organizations: No    Attends Engineer, structural: More than 4 times per year    Marital Status: Married   Family History  Problem Relation Age of Onset   Heart disease Mother    Multiple myeloma Mother    Heart attack Father 20   CAD Father    Hypertension Sister    Hypertension Sister    Hypertension Sister    Heart failure Brother    Sleep apnea Neg Hx    Allergies  Allergen Reactions   Dilaudid [Hydromorphone Hcl] Hives    Can tolerate if premedicated with benadryl   Erythromycin Hives and Nausea And Vomiting   Percocet [Oxycodone-Acetaminophen] Itching and Nausea And Vomiting    Can tolerate with premedication of diphenhydramine and/or nausea medication   Zithromax [Azithromycin] Nausea And Vomiting   Penicillins Hives, Nausea And Vomiting and Rash   Codeine Nausea Only   Oxycodone Other (See Comments)    Other reaction(s): nausea, epigastric pain   Cefprozil Rash    rash but takes Ceftin   Other Rash and Other (See Comments)    Gel on Ekg pads   Prior to Admission medications   Medication Sig Start Date End Date Taking? Authorizing Provider  amLODipine (NORVASC) 10 MG tablet TAKE 1 TABLET BY MOUTH EVERY DAY Patient taking  differently: Take 5 mg by mouth daily. 05/11/22  Yes Yates Decamp, MD  aspirin EC 81 MG EC tablet Take 1 tablet (81 mg total) by mouth daily. Patient taking differently: Take 81 mg by mouth every morning. 07/04/19  Yes Furth, Cadence H, PA-C  atorvastatin (LIPITOR) 80 MG tablet Take 1 tablet by mouth daily.   Yes [provider]  buPROPion (WELLBUTRIN XL) 300 MG 24 hr tablet Take 1 tablet (300 mg total) by mouth daily. 06/08/22  Yes Early, Sung Amabile, NP  carvedilol (COREG) 25 MG tablet TAKE 1&1/2 TABLETS BY MOUTH TWICE A DAY 11/30/22  Yes Yates Decamp, MD  Continuous Blood Gluc Sensor (DEXCOM G7 SENSOR) MISC Apply new sensor every 10 days. Rotate sites. 08/30/22  Yes Early, Sung Amabile, NP  dapagliflozin propanediol (FARXIGA) 10 MG TABS tablet Take 1 tablet (10  mg total) by mouth daily before breakfast. 06/23/22  Yes Early, Sung Amabile, NP  docusate sodium (COLACE) 100 MG capsule Take 100 mg by mouth 2 (two) times daily.   Yes [provider]  escitalopram (LEXAPRO) 20 MG tablet Take 1 tablet (20 mg total) by mouth every morning. 06/23/22  Yes Early, Sung Amabile, NP  ezetimibe (ZETIA) 10 MG tablet TAKE 1 TABLET BY MOUTH EVERY DAY 05/11/22  Yes Yates Decamp, MD  Multiple Vitamin (MULTIVITAMIN WITH MINERALS) TABS tablet Take 1 tablet by mouth every morning.   Yes [provider]  sacubitril-valsartan (ENTRESTO) 49-51 MG TAKE 1 TABLET BY MOUTH TWICE A DAY 09/26/22  Yes Yates Decamp, MD  Semaglutide, 1 MG/DOSE, 4 MG/3ML SOPN Inject 1 mg into the skin once a week. 10/11/22  Yes Early, Sung Amabile, NP  nitroGLYCERIN (NITROSTAT) 0.4 MG SL tablet Place 1 tablet (0.4 mg total) under the tongue every 5 (five) minutes x 3 doses as needed for chest pain. Patient not taking: Reported on 10/25/2022 10/06/22   Yates Decamp, MD    ROS: All other systems have been reviewed and were otherwise negative with the exception of those mentioned in the HPI and as above.  Physical Exam: General: Alert, no acute distress Cardiovascular: No  pedal edema Respiratory: No cyanosis, no use of accessory musculature GI: No organomegaly, abdomen is soft and non-tender Skin: No lesions in the area of chief complaint Neurologic: Sensation intact distally Psychiatric: Patient is competent for consent with normal mood and affect Lymphatic: No axillary or cervical lymphadenopathy  MUSCULOSKELETAL:  The range of motion of her shoulder is to 170 degrees. She has weakness with supraspinatus testing. Infraspinatus and subscapularis intact.  Negative AC tenderness to palpation. Positive impingement. Positive O'Brien's.  Grossly positive tenderness to palpation about the bicipital groove.   Imaging: MRI demonstrates fluid around her biceps. She has a full thickness supraspinatus tear. Type II acromion.   Assessment: LEFT SHOULDER CARTILAGE DISORDER, IMPINGEMENT SYNDROME, BICEPS TEDINITIS, ROTATOR CUFF TEAR  Plan: Plan for Procedure(s): SHOULDER ARTHROSCOPY WITH SUBACROMIAL DECOMPRESSION, ROTATOR CUFF REPAIR AND BICEP TENDON REPAIR  The risks benefits and alternatives were discussed with the patient including but not limited to the risks of nonoperative treatment, versus surgical intervention including infection, bleeding, nerve injury,  blood clots, cardiopulmonary complications, morbidity, mortality, among others, and they were willing to proceed.   The patient acknowledged the explanation, agreed to proceed with the plan and consent was signed.   She received operative clearance from her PCP, Dr. Enid Skeens, and cardiologist, Dr. Jacinto Halim.   Operative Plan: Left shoulder scope with biceps tenodesis, subacromial decompression and rotator cuff repair  Discharge Medications: standard DVT Prophylaxis: aspirin Physical Therapy: outpatient PT Special Discharge needs: Sling. IceMan   Vernetta Honey, PA-C  12/07/2022 4:54 PM

## 2022-12-07 NOTE — Discharge Instructions (Signed)
Ramond Marrow MD, MPH Alfonse Alpers, PA-C [facility] [address], [address] [phone] (tel)   [phone] (fax)   POST-OPERATIVE INSTRUCTIONS - SHOULDER ARTHROSCOPY  WOUND CARE You may remove the Operative Dressing on Post-Op Day #3 (72hrs after surgery).   Alternatively if you would like you can leave dressing on until follow-up if within 7-8 days but keep it dry. Leave steri-strips in place until they fall off on their own, usually 2 weeks postop. There may be a small amount of fluid/bleeding leaking at the surgical site.  This is normal; the shoulder is filled with fluid during the procedure and can leak for 24-48hrs after surgery.  You may change/reinforce the bandage as needed.  Use the Cryocuff or Ice as often as possible for the first 7 days, then as needed for pain relief. Always keep a towel, ACE wrap or other barrier between the cooling unit and your skin.  You may shower on Post-Op Day #3. Gently pat the area dry.  Do not soak the shoulder in water or submerge it.  Keep incisions as dry as possible. Do not go swimming in the pool or ocean until 4 weeks after surgery or when otherwise instructed.    EXERCISES Wear the sling at all times  You may remove the sling for showering, but keep the arm across the chest or in a secondary sling.     It is normal for your fingers/hand to become more swollen after surgery and discolored from bruising.   This will resolve over the first few weeks usually after surgery. Please continue to ambulate and do not stay sitting or lying for too long.  Perform foot and wrist pumps to assist in circulation.  PHYSICAL THERAPY - You will begin physical therapy soon after surgery (unless otherwise specified) - Please call to set up an appointment, if you do not already have one  - Let our office if there are any issues with scheduling your therapy  - You have a physical therapy appointment scheduled at [facility] (across  the hall from our office) on Monday April 22 at 10:15   REGIONAL ANESTHESIA (NERVE BLOCKS) The anesthesia team may have performed a nerve block for you this is a great tool used to minimize pain.   The block may start wearing off overnight (between 8-24 hours postop) When the block wears off, your pain may go from nearly zero to the pain you would have had postop without the block. This is an abrupt transition but nothing dangerous is happening.   This can be a challenging period but utilize your as needed pain medications to try and manage this period. We suggest you use the pain medication the first night prior to going to bed, to ease this transition.  You may take an extra dose of narcotic when this happens if needed  POST-OP MEDICATIONS- Multimodal approach to pain control In general your pain will be controlled with a combination of substances.  Prescriptions unless otherwise discussed are electronically sent to your pharmacy.  This is a carefully made plan we use to minimize narcotic use.     Celebrex - Anti-inflammatory medication taken on a scheduled basis Acetaminophen - Non-narcotic pain medicine taken on a scheduled basis  Oxycodone - This is a strong narcotic, to be used only on an "as needed" basis for SEVERE pain. Zofran - take as needed for nausea Other:: Benadryl - you may use this as needed for any itching caused by pain  medications  FOLLOW-UP If you develop a Fever (?101.5), Redness or Drainage from the surgical incision site, please call our office to arrange for an evaluation. Please call the office to schedule a follow-up appointment for your first post-operative appointment, 7-10 days post-operatively.    HELPFUL INFORMATION   You may be more comfortable sleeping in a semi-seated position the first few nights following surgery.  Keep a pillow propped under the elbow and forearm for comfort.  If you have a recliner type of chair it might be beneficial.  If not that  is fine too, but it would be helpful to sleep propped up with pillows behind your operated shoulder as well under your elbow and forearm.  This will reduce pulling on the suture lines.  When dressing, put your operative arm in the sleeve first.  When getting undressed, take your operative arm out last.  Loose fitting, button-down shirts are recommended.  Often in the first days after surgery you may be more comfortable keeping your operative arm under your shirt and not through the sleeve.  You may return to work/school in the next couple of days when you feel up to it.  Desk work and typing in the sling is fine.  We suggest you use the pain medication the first night prior to going to bed, in order to ease any pain when the anesthesia wears off. You should avoid taking pain medications on an empty stomach as it will make you nauseous.  You should wean off your narcotic medicines as soon as you are able.  Most patients will be off narcotics before their first postop appointment.   Do not drink alcoholic beverages or take illicit drugs when taking pain medications.  It is against the law to drive while taking narcotics.  In some states it is against the law to drive while your arm is in a sling.   Pain medication may make you constipated.  Below are a few solutions to try in this order: Decrease the amount of pain medication if you aren't having pain. Drink lots of decaffeinated fluids. Drink prune juice and/or eat dried prunes  If the first 3 don't work start with additional solutions Take Colace - an over-the-counter stool softener Take Senokot - an over-the-counter laxative Take Miralax - a stronger over-the-counter laxative  For more information including helpful videos and documents visit our website:   [email or web address]-[email or web address]   No Tylenol until after 1:00pm today, if needed.   Post Anesthesia Home Care Instructions  Activity: Get plenty of rest for  the remainder of the day. A responsible individual must stay with you for 24 hours following the procedure.  For the next 24 hours, DO NOT: -Drive a car -Advertising copywriter -Drink alcoholic beverages -Take any medication unless instructed by your physician -Make any legal decisions or sign important papers.  Meals: Start with liquid foods such as gelatin or soup. Progress to regular foods as tolerated. Avoid greasy, spicy, heavy foods. If nausea and/or vomiting occur, drink only clear liquids until the nausea and/or vomiting subsides. Call your physician if vomiting continues.  Special Instructions/Symptoms: Your throat may feel dry or sore from the anesthesia or the breathing tube placed in your throat during surgery. If this causes discomfort, gargle with warm salt water. The discomfort should disappear within 24 hours.  If you had a scopolamine patch placed behind your ear for the management of post- operative nausea and/or vomiting:  1. The medication in the patch is effective for 72  hours, after which it should be removed.  Wrap patch in a tissue and discard in the trash. Wash hands thoroughly with soap and water. 2. You may remove the patch earlier than 72 hours if you experience unpleasant side effects which may include dry mouth, dizziness or visual disturbances. 3. Avoid touching the patch. Wash your hands with soap and water after contact with the patch.   Regional Anesthesia Blocks  1. Numbness or the inability to move the "blocked" extremity may last from 3-48 hours after placement. The length of time depends on the medication injected and your individual response to the medication. If the numbness is not going away after 48 hours, call your surgeon.  2. The extremity that is blocked will need to be protected until the numbness is gone and the  Strength has returned. Because you cannot feel it, you will need to take extra care to avoid injury. Because it may be weak, you may have  difficulty moving it or using it. You may not know what position it is in without looking at it while the block is in effect.  3. For blocks in the legs and feet, returning to weight bearing and walking needs to be done carefully. You will need to wait until the numbness is entirely gone and the strength has returned. You should be able to move your leg and foot normally before you try and bear weight or walk. You will need someone to be with you when you first try to ensure you do not fall and possibly risk injury.  4. Bruising and tenderness at the needle site are common side effects and will resolve in a few days.  5. Persistent numbness or new problems with movement should be communicated to the surgeon or the [facility] [phone] [facility] [phone]).Information for Discharge Teaching: EXPAREL (bupivacaine liposome injectable suspension)   Your surgeon or anesthesiologist gave you EXPAREL(bupivacaine) to help control your pain after surgery.  EXPAREL is a local anesthetic that provides pain relief by numbing the tissue around the surgical site. EXPAREL is designed to release pain medication over time and can control pain for up to 72 hours. Depending on how you respond to EXPAREL, you may require less pain medication during your recovery.  Possible side effects: Temporary loss of sensation or ability to move in the area where bupivacaine was injected. Nausea, vomiting, constipation Rarely, numbness and tingling in your mouth or lips, lightheadedness, or anxiety may occur. Call your doctor right away if you think you may be experiencing any of these sensations, or if you have other questions regarding possible side effects.  Follow all other discharge instructions given to you by your surgeon or nurse. Eat a healthy diet and drink plenty of water or other fluids.  If you return to the hospital for any reason within 96 hours following the administration  of EXPAREL, it is important for health care providers to know that you have received this anesthetic. A teal colored band has been placed on your arm with the date, time and amount of EXPAREL you have received in order to alert and inform your health care providers. Please leave this armband in place for the full 96 hours following administration, and then you may remove the band.

## 2022-12-08 ENCOUNTER — Other Ambulatory Visit: Payer: Self-pay

## 2022-12-08 ENCOUNTER — Encounter (HOSPITAL_BASED_OUTPATIENT_CLINIC_OR_DEPARTMENT_OTHER): Payer: Self-pay | Admitting: Orthopaedic Surgery

## 2022-12-08 ENCOUNTER — Encounter (HOSPITAL_BASED_OUTPATIENT_CLINIC_OR_DEPARTMENT_OTHER)
Admission: RE | Disposition: A | Discharge status: Home / Self Care | Payer: Self-pay | Source: Home / Self Care | Attending: Orthopaedic Surgery

## 2022-12-08 ENCOUNTER — Ambulatory Visit (HOSPITAL_BASED_OUTPATIENT_CLINIC_OR_DEPARTMENT_OTHER): Payer: BC Managed Care – PPO | Admitting: Anesthesiology

## 2022-12-08 ENCOUNTER — Ambulatory Visit (HOSPITAL_BASED_OUTPATIENT_CLINIC_OR_DEPARTMENT_OTHER)
Admission: RE | Admit: 2022-12-08 | Discharge: 2022-12-08 | Disposition: A | Discharge status: Home / Self Care | Payer: BC Managed Care – PPO | Attending: Orthopaedic Surgery | Admitting: Orthopaedic Surgery

## 2022-12-08 DIAGNOSIS — I130 Hypertensive heart and chronic kidney disease with heart failure and stage 1 through stage 4 chronic kidney disease, or unspecified chronic kidney disease: Secondary | ICD-10-CM | POA: Insufficient documentation

## 2022-12-08 DIAGNOSIS — I2510 Atherosclerotic heart disease of native coronary artery without angina pectoris: Secondary | ICD-10-CM | POA: Diagnosis not present

## 2022-12-08 DIAGNOSIS — Z7984 Long term (current) use of oral hypoglycemic drugs: Secondary | ICD-10-CM | POA: Insufficient documentation

## 2022-12-08 DIAGNOSIS — M7542 Impingement syndrome of left shoulder: Secondary | ICD-10-CM | POA: Diagnosis not present

## 2022-12-08 DIAGNOSIS — S43432A Superior glenoid labrum lesion of left shoulder, initial encounter: Secondary | ICD-10-CM | POA: Diagnosis not present

## 2022-12-08 DIAGNOSIS — M7522 Bicipital tendinitis, left shoulder: Secondary | ICD-10-CM | POA: Diagnosis not present

## 2022-12-08 DIAGNOSIS — X58XXXA Exposure to other specified factors, initial encounter: Secondary | ICD-10-CM | POA: Diagnosis not present

## 2022-12-08 DIAGNOSIS — Z8249 Family history of ischemic heart disease and other diseases of the circulatory system: Secondary | ICD-10-CM | POA: Insufficient documentation

## 2022-12-08 DIAGNOSIS — I509 Heart failure, unspecified: Secondary | ICD-10-CM | POA: Diagnosis not present

## 2022-12-08 DIAGNOSIS — G8918 Other acute postprocedural pain: Secondary | ICD-10-CM | POA: Diagnosis not present

## 2022-12-08 DIAGNOSIS — N189 Chronic kidney disease, unspecified: Secondary | ICD-10-CM | POA: Insufficient documentation

## 2022-12-08 DIAGNOSIS — N1831 Chronic kidney disease, stage 3a: Secondary | ICD-10-CM | POA: Diagnosis not present

## 2022-12-08 DIAGNOSIS — E1122 Type 2 diabetes mellitus with diabetic chronic kidney disease: Secondary | ICD-10-CM | POA: Diagnosis not present

## 2022-12-08 DIAGNOSIS — M24112 Other articular cartilage disorders, left shoulder: Secondary | ICD-10-CM | POA: Diagnosis not present

## 2022-12-08 DIAGNOSIS — I252 Old myocardial infarction: Secondary | ICD-10-CM | POA: Diagnosis not present

## 2022-12-08 DIAGNOSIS — S46012A Strain of muscle(s) and tendon(s) of the rotator cuff of left shoulder, initial encounter: Secondary | ICD-10-CM | POA: Insufficient documentation

## 2022-12-08 DIAGNOSIS — M75102 Unspecified rotator cuff tear or rupture of left shoulder, not specified as traumatic: Secondary | ICD-10-CM | POA: Diagnosis not present

## 2022-12-08 DIAGNOSIS — Z955 Presence of coronary angioplasty implant and graft: Secondary | ICD-10-CM | POA: Diagnosis not present

## 2022-12-08 DIAGNOSIS — M7552 Bursitis of left shoulder: Secondary | ICD-10-CM | POA: Diagnosis not present

## 2022-12-08 HISTORY — PX: SHOULDER ARTHROSCOPY WITH SUBACROMIAL DECOMPRESSION, ROTATOR CUFF REPAIR AND BICEP TENDON REPAIR: SHX5687

## 2022-12-08 LAB — GLUCOSE, CAPILLARY
Glucose-Capillary: 103 mg/dL — ABNORMAL HIGH (ref 70–99)
Glucose-Capillary: 180 mg/dL — ABNORMAL HIGH (ref 70–99)

## 2022-12-08 SURGERY — SHOULDER ARTHROSCOPY WITH SUBACROMIAL DECOMPRESSION, ROTATOR CUFF REPAIR AND BICEP TENDON REPAIR
Anesthesia: Regional | Site: Shoulder | Laterality: Left

## 2022-12-08 MED ORDER — FENTANYL CITRATE (PF) 100 MCG/2ML IJ SOLN: 25.0000 ug | INTRAMUSCULAR | Status: DC | PRN

## 2022-12-08 MED ORDER — PROPOFOL 10 MG/ML IV BOLUS
INTRAVENOUS | Status: AC
  Filled 2022-12-08: qty 20

## 2022-12-08 MED ORDER — PHENYLEPHRINE HCL (PRESSORS) 10 MG/ML IV SOLN
INTRAVENOUS | Status: DC | PRN
  Administered 2022-12-08 (×2): 80 ug via INTRAVENOUS
  Administered 2022-12-08 (×2): 160 ug via INTRAVENOUS

## 2022-12-08 MED ORDER — GABAPENTIN 300 MG PO CAPS
ORAL_CAPSULE | ORAL | Status: AC
  Filled 2022-12-08: qty 1

## 2022-12-08 MED ORDER — MIDAZOLAM HCL 2 MG/2ML IJ SOLN
2.0000 mg | Freq: Once | INTRAMUSCULAR | Status: AC
  Administered 2022-12-08: 1 mg via INTRAVENOUS

## 2022-12-08 MED ORDER — CELECOXIB 100 MG PO CAPS: 100.0000 mg | ORAL_CAPSULE | Freq: Two times a day (BID) | ORAL | 0 refills | Status: AC

## 2022-12-08 MED ORDER — DEXAMETHASONE SODIUM PHOSPHATE 10 MG/ML IJ SOLN
INTRAMUSCULAR | Status: DC | PRN
  Administered 2022-12-08: 5 mg via INTRAVENOUS

## 2022-12-08 MED ORDER — SUGAMMADEX SODIUM 200 MG/2ML IV SOLN
INTRAVENOUS | Status: DC | PRN
  Administered 2022-12-08: 400 mg via INTRAVENOUS

## 2022-12-08 MED ORDER — FENTANYL CITRATE (PF) 100 MCG/2ML IJ SOLN
INTRAMUSCULAR | Status: AC
  Filled 2022-12-08: qty 2

## 2022-12-08 MED ORDER — LIDOCAINE 2% (20 MG/ML) 5 ML SYRINGE
INTRAMUSCULAR | Status: AC
  Filled 2022-12-08: qty 5

## 2022-12-08 MED ORDER — ONDANSETRON HCL 4 MG PO TABS: 4.0000 mg | ORAL_TABLET | Freq: Three times a day (TID) | ORAL | 0 refills | Status: AC | PRN

## 2022-12-08 MED ORDER — ACETAMINOPHEN 500 MG PO TABS
1000.0000 mg | ORAL_TABLET | Freq: Once | ORAL | Status: AC
  Administered 2022-12-08: 1000 mg via ORAL

## 2022-12-08 MED ORDER — ROCURONIUM BROMIDE 10 MG/ML (PF) SYRINGE
PREFILLED_SYRINGE | INTRAVENOUS | Status: AC
  Filled 2022-12-08: qty 10

## 2022-12-08 MED ORDER — SODIUM CHLORIDE 0.9 % IR SOLN
Status: DC | PRN
  Administered 2022-12-08: 6000 mL

## 2022-12-08 MED ORDER — PHENYLEPHRINE 80 MCG/ML (10ML) SYRINGE FOR IV PUSH (FOR BLOOD PRESSURE SUPPORT)
PREFILLED_SYRINGE | INTRAVENOUS | Status: AC
  Filled 2022-12-08: qty 10

## 2022-12-08 MED ORDER — ACETAMINOPHEN 500 MG PO TABS
ORAL_TABLET | ORAL | Status: AC
  Filled 2022-12-08: qty 2

## 2022-12-08 MED ORDER — VANCOMYCIN HCL IN DEXTROSE 1-5 GM/200ML-% IV SOLN
INTRAVENOUS | Status: AC
  Filled 2022-12-08: qty 200

## 2022-12-08 MED ORDER — OXYCODONE HCL 5 MG PO TABS: ORAL_TABLET | ORAL | 0 refills | Status: AC

## 2022-12-08 MED ORDER — GABAPENTIN 300 MG PO CAPS
300.0000 mg | ORAL_CAPSULE | Freq: Once | ORAL | Status: AC
  Administered 2022-12-08: 300 mg via ORAL

## 2022-12-08 MED ORDER — EPHEDRINE 5 MG/ML INJ
INTRAVENOUS | Status: AC
  Filled 2022-12-08: qty 5

## 2022-12-08 MED ORDER — TRANEXAMIC ACID-NACL 1000-0.7 MG/100ML-% IV SOLN
1000.0000 mg | INTRAVENOUS | Status: AC
  Administered 2022-12-08: 1000 mg via INTRAVENOUS

## 2022-12-08 MED ORDER — LIDOCAINE 2% (20 MG/ML) 5 ML SYRINGE
INTRAMUSCULAR | Status: DC | PRN
  Administered 2022-12-08: 20 mg via INTRAVENOUS

## 2022-12-08 MED ORDER — PHENYLEPHRINE HCL-NACL 20-0.9 MG/250ML-% IV SOLN
INTRAVENOUS | Status: DC | PRN
  Administered 2022-12-08: 25 ug/min via INTRAVENOUS

## 2022-12-08 MED ORDER — VANCOMYCIN HCL 1000 MG IV SOLR
INTRAVENOUS | Status: AC
  Filled 2022-12-08: qty 20

## 2022-12-08 MED ORDER — BUPIVACAINE HCL (PF) 0.5 % IJ SOLN
INTRAMUSCULAR | Status: DC | PRN
  Administered 2022-12-08: 15 mL via PERINEURAL

## 2022-12-08 MED ORDER — EPHEDRINE SULFATE (PRESSORS) 50 MG/ML IJ SOLN
INTRAMUSCULAR | Status: DC | PRN
  Administered 2022-12-08: 5 mg via INTRAVENOUS
  Administered 2022-12-08: 10 mg via INTRAVENOUS
  Administered 2022-12-08: 15 mg via INTRAVENOUS
  Administered 2022-12-08 (×2): 10 mg via INTRAVENOUS

## 2022-12-08 MED ORDER — ROCURONIUM BROMIDE 100 MG/10ML IV SOLN
INTRAVENOUS | Status: DC | PRN
  Administered 2022-12-08: 50 mg via INTRAVENOUS

## 2022-12-08 MED ORDER — ONDANSETRON HCL 4 MG/2ML IJ SOLN
INTRAMUSCULAR | Status: DC | PRN
  Administered 2022-12-08: 4 mg via INTRAVENOUS

## 2022-12-08 MED ORDER — PHENYLEPHRINE HCL (PRESSORS) 10 MG/ML IV SOLN
INTRAVENOUS | Status: AC
  Filled 2022-12-08: qty 1

## 2022-12-08 MED ORDER — ACETAMINOPHEN 500 MG PO TABS: 1000.0000 mg | ORAL_TABLET | Freq: Three times a day (TID) | ORAL | 0 refills | Status: AC

## 2022-12-08 MED ORDER — BUPIVACAINE LIPOSOME 1.3 % IJ SUSP
INTRAMUSCULAR | Status: DC | PRN
  Administered 2022-12-08: 10 mL via PERINEURAL

## 2022-12-08 MED ORDER — PROPOFOL 10 MG/ML IV BOLUS
INTRAVENOUS | Status: DC | PRN
  Administered 2022-12-08: 130 mg via INTRAVENOUS

## 2022-12-08 MED ORDER — DEXAMETHASONE SODIUM PHOSPHATE 10 MG/ML IJ SOLN
INTRAMUSCULAR | Status: AC
  Filled 2022-12-08: qty 1

## 2022-12-08 MED ORDER — DIPHENHYDRAMINE HCL 25 MG PO CAPS: 25.0000 mg | ORAL_CAPSULE | Freq: Four times a day (QID) | ORAL | 0 refills | Status: DC | PRN

## 2022-12-08 MED ORDER — FENTANYL CITRATE (PF) 100 MCG/2ML IJ SOLN: 50.0000 ug | Freq: Once | INTRAMUSCULAR | Status: DC

## 2022-12-08 MED ORDER — MIDAZOLAM HCL 2 MG/2ML IJ SOLN
INTRAMUSCULAR | Status: AC
  Filled 2022-12-08: qty 2

## 2022-12-08 MED ORDER — FENTANYL CITRATE (PF) 100 MCG/2ML IJ SOLN
100.0000 ug | Freq: Once | INTRAMUSCULAR | Status: AC
  Administered 2022-12-08: 100 ug via INTRAVENOUS

## 2022-12-08 MED ORDER — LACTATED RINGERS IV SOLN: INTRAVENOUS | Status: DC

## 2022-12-08 MED ORDER — TRANEXAMIC ACID-NACL 1000-0.7 MG/100ML-% IV SOLN
INTRAVENOUS | Status: AC
  Filled 2022-12-08: qty 100

## 2022-12-08 MED ORDER — ONDANSETRON HCL 4 MG/2ML IJ SOLN
INTRAMUSCULAR | Status: AC
  Filled 2022-12-08: qty 2

## 2022-12-08 MED ORDER — VANCOMYCIN HCL IN DEXTROSE 1-5 GM/200ML-% IV SOLN
1000.0000 mg | INTRAVENOUS | Status: AC
  Administered 2022-12-08: 1000 mg via INTRAVENOUS

## 2022-12-08 MED ORDER — EPINEPHRINE PF 1 MG/ML IJ SOLN
INTRAMUSCULAR | Status: AC
  Filled 2022-12-08: qty 1

## 2022-12-08 SURGICAL SUPPLY — 61 items
AID PSTN UNV HD RSTRNT DISP (MISCELLANEOUS) ×1
ANCH SUT 2 FBRTK KNTLS 1.8 (Anchor) ×2 IMPLANT
ANCH SUT 2.6 FBRSTK 1.7 (Anchor) ×1 IMPLANT
ANCH SUT 2.6 FBRTK 1.7 KNTLS (Anchor) ×1 IMPLANT
ANCH SUT SWLK 19.1X4.75 (Anchor) ×2 IMPLANT
ANCHOR SUT 1.8 FIBERTAK SB KL (Anchor) IMPLANT
ANCHOR SUT BIO SW 4.75X19.1 (Anchor) IMPLANT
ANCHOR SUT FBRTK 2.6X1.7X2 (Anchor) IMPLANT
APL PRP STRL LF DISP 70% ISPRP (MISCELLANEOUS) ×1
BLADE EXCALIBUR 4.0X13 (MISCELLANEOUS) ×2 IMPLANT
BURR OVAL 8 FLU 4.0X13 (MISCELLANEOUS) ×2 IMPLANT
CANNULA 5.75X71 LONG (CANNULA) IMPLANT
CANNULA PASSPORT 5 (CANNULA) IMPLANT
CANNULA PASSPORT BUTTON 10-40 (CANNULA) IMPLANT
CANNULA TWIST IN 8.25X7CM (CANNULA) IMPLANT
CHLORAPREP W/TINT 26 (MISCELLANEOUS) ×2 IMPLANT
CLSR STERI-STRIP ANTIMIC 1/2X4 (GAUZE/BANDAGES/DRESSINGS) ×2 IMPLANT
COOLER ICEMAN CLASSIC (MISCELLANEOUS) ×2 IMPLANT
DRAPE IMP U-DRAPE 54X76 (DRAPES) ×2 IMPLANT
DRAPE INCISE IOBAN 66X45 STRL (DRAPES) IMPLANT
DRAPE SHOULDER BEACH CHAIR (DRAPES) ×2 IMPLANT
DW OUTFLOW CASSETTE/TUBE SET (MISCELLANEOUS) ×2 IMPLANT
GAUZE PAD ABD 8X10 STRL (GAUZE/BANDAGES/DRESSINGS) ×2 IMPLANT
GAUZE SPONGE 4X4 12PLY STRL (GAUZE/BANDAGES/DRESSINGS) ×2 IMPLANT
GLOVE BIO SURGEON STRL SZ 6.5 (GLOVE) ×2 IMPLANT
GLOVE BIOGEL PI IND STRL 6.5 (GLOVE) ×2 IMPLANT
GLOVE BIOGEL PI IND STRL 8 (GLOVE) ×2 IMPLANT
GLOVE ECLIPSE 8.0 STRL XLNG CF (GLOVE) ×2 IMPLANT
GOWN STRL REUS W/ TWL LRG LVL3 (GOWN DISPOSABLE) ×4 IMPLANT
GOWN STRL REUS W/TWL LRG LVL3 (GOWN DISPOSABLE) ×2
GOWN STRL REUS W/TWL XL LVL3 (GOWN DISPOSABLE) ×2 IMPLANT
IMPL FIBERTAK KNTLS 2.6 (Anchor) IMPLANT
IMPLANT FIBERTAK KNTLS 2.6 (Anchor) ×1 IMPLANT
KIT STABILIZATION SHOULDER (MISCELLANEOUS) ×2 IMPLANT
KIT STR SPEAR 1.8 FBRTK DISP (KITS) IMPLANT
LASSO CRESCENT QUICKPASS (SUTURE) IMPLANT
LOOP 2 FIBERLINK CLOSED (SUTURE) IMPLANT
MANIFOLD NEPTUNE II (INSTRUMENTS) ×2 IMPLANT
NDL HD SCORPION MEGA LOADER (NEEDLE) IMPLANT
NDL SAFETY ECLIP 18X1.5 (MISCELLANEOUS) ×2 IMPLANT
PACK ARTHROSCOPY DSU (CUSTOM PROCEDURE TRAY) ×2 IMPLANT
PACK BASIN DAY SURGERY FS (CUSTOM PROCEDURE TRAY) ×2 IMPLANT
PAD COLD SHLDR WRAP-ON (PAD) ×2 IMPLANT
PORT APPOLLO RF 90DEGREE MULTI (SURGICAL WAND) ×2 IMPLANT
RESTRAINT HEAD UNIVERSAL NS (MISCELLANEOUS) ×2 IMPLANT
SHEET MEDIUM DRAPE 40X70 STRL (DRAPES) ×2 IMPLANT
SLEEVE SCD COMPRESS KNEE MED (STOCKING) ×2 IMPLANT
SLING ARM FOAM STRAP LRG (SOFTGOODS) IMPLANT
SUT FIBERWIRE #2 38 T-5 BLUE (SUTURE)
SUT MNCRL AB 4-0 PS2 18 (SUTURE) ×2 IMPLANT
SUT PDS AB 1 CT  36 (SUTURE)
SUT PDS AB 1 CT 36 (SUTURE) IMPLANT
SUT TIGER TAPE 7 IN WHITE (SUTURE) IMPLANT
SUTURE FIBERWR #2 38 T-5 BLUE (SUTURE) IMPLANT
SUTURE TAPE TIGERLINK 1.3MM BL (SUTURE) IMPLANT
SUTURETAPE TIGERLINK 1.3MM BL (SUTURE)
SYR 5ML LL (SYRINGE) ×2 IMPLANT
TAPE FIBER 2MM 7IN #2 BLUE (SUTURE) IMPLANT
TOWEL GREEN STERILE FF (TOWEL DISPOSABLE) ×4 IMPLANT
TUBE CONNECTING 20X1/4 (TUBING) ×2 IMPLANT
TUBING ARTHROSCOPY IRRIG 16FT (MISCELLANEOUS) ×2 IMPLANT

## 2022-12-08 NOTE — Progress Notes (Signed)
Assisted Dr. Woodrum with left, interscalene , ultrasound guided block. Side rails up, monitors on throughout procedure. See vital signs in flow sheet. Tolerated Procedure well. 

## 2022-12-08 NOTE — Op Note (Signed)
Orthopaedic Surgery Operative Note (CSN: [id number])  Andrea Robertson  11/13/1960 Date of Surgery: 12/08/2022   DIAGNOSES: Left shoulder, acute on chronic rotator cuff tear, SLAP tear, biceps tendinitis, and subacromial impingement.  POST-OPERATIVE DIAGNOSIS: same  PROCEDURE: Arthroscopic extensive debridement - 29823 Subdeltoid Bursa, Supraspinatus Tendon, Anterior Labrum, Superior Labrum, Posterior Labrum, and humeral bone, humeral cartilage, glenoid bone and glenoid cartilage Arthroscopic subacromial decompression - 78295 Arthroscopic rotator cuff repair - 62130 Arthroscopic biceps tenodesis - 86578   OPERATIVE FINDING: Exam under anesthesia: Normal Articular space:  Anterior and posterior labral tearing cartilage intact Chondral surfaces: Normal Biceps:  Type II SLAP tear Subscapularis: Incomplete tear upper border 5% tear of the articular side debrided back Supraspinatus: Complete tear retraction to the mid humeral head. Infraspinatus: Complete tear as above     Post-operative plan: The patient will be non-weightbearing in a sling for 6 weeks.  The patient will be discharged home.  DVT prophylaxis not indicated in ambulatory upper extremity patient without known risk factors.   Pain control with PRN pain medication preferring oral medicines.  Follow up plan will be scheduled in approximately 7 days for incision check and XR.  Surgeons:Primary: Bjorn Pippin, MD Assistants:Caroline McBane PA-C Location: [facility] OR ROOM 6 Anesthesia: General with Exparel interscalene block Antibiotics: Levaquin and vancomycin Tourniquet time: None Estimated Blood Loss: Minimal Complications: None Specimens: None Implants: Implant Name Type Inv. Item Serial No. Manufacturer Lot No. LRB No. Used Action  ANCHOR SUT 1.8 FIBERTAK SB KL - [id number] Anchor ANCHOR SUT 1.8 FIBERTAK SB KL  [employer] [id number] Left 1 Implanted  ANCHOR SUT 1.8 FIBERTAK SB KL - [id number] Anchor ANCHOR SUT 1.8  FIBERTAK SB KL  [employer] [id number] Left 1 Implanted  ANCHOR SUT BIO SW 4.75X19.1 - [id number] Anchor ANCHOR SUT BIO SW 4.75X19.1  [employer] [id number] Left 1 Implanted  ANCHOR SUT BIO SW 4.75X19.1 - [id number] Anchor ANCHOR SUT BIO SW 4.75X19.1  [employer] [id number] Left 1 Implanted  IMPLANT FIBERTAK KNTLS 2.6 - [id number] Anchor IMPLANT FIBERTAK KNTLS 2.6  [employer] [id number] Left 1 Implanted  ANCHOR SUT FBRTK 2.6X1.Hansel Feinstein Anchor ANCHOR SUT FBRTK [id number]  Marcie Bal [id number] Left 1 Implanted    Indications for Surgery:   Andrea Robertson is a 62 y.o. female with continued shoulder pain refractory to nonoperative measures for extended period of time.    The risks and benefits were explained at length including but not limited to continued pain, cuff failure, biceps tenodesis failure, stiffness, need for further surgery and infection.   Procedure:   Patient was correctly identified in the preoperative holding area and operative site marked.  Patient brought to OR and positioned beachchair on an [place] table ensuring that all bony prominences were padded and the head was in an appropriate location.  Anesthesia was induced and the operative shoulder was prepped and draped in the usual sterile fashion.  Timeout was called preincision.  A standard posterior viewing portal was made after localizing the portal with a spinal needle.  An anterior accessory portal was also made.  After clearing the articular space the camera was positioned in the subacromial space.  Findings above.    Extensive debridement was performed of the anterior interval tissue, labral fraying and the bursa.  Subacromial decompression: We made a lateral portal with spinal needle guidance. We then proceeded to debride bursal tissue extensively with a shaver and arthrocare device. At that point we continued to identify the borders of the acromion and  identify the spur. We then carefully preserved the deltoid fascia and  used a burr to convert the acromion to a Type 1 flat acromion without issue.  Arthroscopic Rotator Cuff Repair: Tuberosity was prepared with a burr to a bleeding bed.  Following completion of the above we placed 2 4.7 Swivelock anchor loaded with a tape at inserted at the medial articular margin and an scorpion suture passing device, shuttled  sutures medially in a horizontal mattress suture configuration.  We then tied using arthroscopic knot tying techniques  each suture to its partner reducing the tendon at the prepared insertion site.  The fiber tape was not tied. With a medial row suture limbs then incorporated, 2 anteriorly and  2 posteriorly, into each of two 4.75 PEEK SwiveLock anchors, each placed 8 to 10 mm below the tip of the tuberosity and spanning anterior-posterior width of the tear with care to avoid over tensioning.   Biceps tenodesis: We marked the tendon and then performed a tenotomy and debridement of the stump in the articular space. We then identified the biceps tendon in its groove suprapec with the arthroscope in the lateral portal taking care to move from lateral to medial to avoid injury to the subscapularis. At that point we unroofed the tendon itself and mobilized it. An accessory anterior portal was made in line with the tendon and we grasped it from the anterior superior portal and worked from the accessory anterior portal. Two Fibertak 1.80mm knotless anchors were placed in the groove and the tendon was secured in a luggage loop style fashion with a pass of the limb of suture through the tendon using a scorpion device to avoid pull-through.  Repair was completed with good tension on the tendon.  Residual stump of the tendon was removed after being resected with a RF ablator.   The incisions were closed with absorbable monocryl and steri strips.  A sterile dressing was placed along with a sling. The patient was awoken from general anesthesia and taken to the PACU in stable  condition without complication.   Alfonse Alpers, PA-C, present and scrubbed throughout the case, critical for completion in a timely fashion, and for retraction, instrumentation, closure.

## 2022-12-08 NOTE — Anesthesia Procedure Notes (Signed)
Procedure Name: Intubation Date/Time: 12/08/2022 8:04 AM  Performed by: Lauralyn Primes, CRNAPre-anesthesia Checklist: Patient identified, Emergency Drugs available, Suction available and Patient being monitored Patient Re-evaluated:Patient Re-evaluated prior to induction Oxygen Delivery Method: Circle system utilized Preoxygenation: Pre-oxygenation with 100% oxygen Induction Type: IV induction Ventilation: Mask ventilation without difficulty and Oral airway inserted - appropriate to patient size Laryngoscope Size: Mac and 3 Grade View: Grade II Tube type: Oral Tube size: 7.0 mm Number of attempts: 1 Airway Equipment and Method: Stylet, Oral airway and Bite block Placement Confirmation: ETT inserted through vocal cords under direct vision, positive ETCO2 and breath sounds checked- equal and bilateral Secured at: 22 cm Tube secured with: Tape Dental Injury: Teeth and Oropharynx as per pre-operative assessment

## 2022-12-08 NOTE — Anesthesia Procedure Notes (Signed)
Anesthesia Regional Block: Interscalene brachial plexus block   Pre-Anesthetic Checklist: , timeout performed,  Correct Patient, Correct Site, Correct Laterality,  Correct Procedure, Correct Position, site marked,  Risks and benefits discussed,  Pre-op evaluation,  At surgeon's request and post-op pain management  Laterality: Left  Prep: Maximum Sterile Barrier Precautions used, chloraprep       Needles:  Injection technique: Single-shot  Needle Type: Echogenic Stimulator Needle     Needle Length: 5cm  Needle Gauge: 21     Additional Needles:   Procedures:,,,, ultrasound used (permanent image in chart),,    Narrative:  Start time: 12/08/2022 7:40 AM End time: 12/08/2022 7:45 AM Injection made incrementally with aspirations every 5 mL. Anesthesiologist: Elmer Picker, MD

## 2022-12-08 NOTE — Anesthesia Preprocedure Evaluation (Addendum)
Anesthesia Evaluation  Patient identified by MRN, date of birth, ID band Patient awake    Reviewed: Allergy & Precautions, NPO status , Patient's Chart, lab work & pertinent test results, reviewed documented beta blocker date and time   Airway Mallampati: II  TM Distance: >3 FB Neck ROM: Full    Dental no notable dental hx. (+) Teeth Intact, Dental Advisory Given   Pulmonary neg pulmonary ROS   Pulmonary exam normal breath sounds clear to auscultation       Cardiovascular hypertension, Pt. on medications and Pt. on home beta blockers + angina  + CAD, + Past MI, + Cardiac Stents and +CHF  Normal cardiovascular exam Rhythm:Regular Rate:Normal  TTE 2020  1. Left ventricular ejection fraction, by visual estimation, is 50 to  55%. The left ventricle has normal function. Left ventricular septal wall  thickness was mildly increased. There is no left ventricular hypertrophy.   2. Left ventricular diastolic parameters are consistent with Grade I  diastolic dysfunction (impaired relaxation).   3. Hypokinesis of the mid to apical anteroseptum, apical anterior, and  apical myocardium.   4. Global right ventricle has normal systolic function.The right  ventricular size is normal. No increase in right ventricular wall  thickness.   5. Left atrial size was normal.   6. Right atrial size was normal.   7. Trivial pericardial effusion is present.   8. The mitral valve is normal in structure. No evidence of mitral valve  regurgitation. No evidence of mitral stenosis.   9. The tricuspid valve is normal in structure. Tricuspid valve  regurgitation is trivial.  10. The aortic valve is tricuspid. Aortic valve regurgitation is trivial.  No evidence of aortic valve sclerosis or stenosis.  11. The pulmonic valve was normal in structure. Pulmonic valve  regurgitation is trivial.  12. Normal pulmonary artery systolic pressure.  13. The inferior vena  cava is normal in size with greater than 50%  respiratory variability, suggesting right atrial pressure of 3 mmHg.     Neuro/Psych  PSYCHIATRIC DISORDERS Anxiety Depression    negative neurological ROS     GI/Hepatic negative GI ROS, Neg liver ROS,,,  Endo/Other  diabetes, Type 2    Renal/GU Renal InsufficiencyRenal disease  negative genitourinary   Musculoskeletal  (+) Arthritis ,    Abdominal   Peds  Hematology  (+) Blood dyscrasia, Sickle cell trait   Anesthesia Other Findings   Reproductive/Obstetrics                             Anesthesia Physical Anesthesia Plan  ASA: 3  Anesthesia Plan: General and Regional   Post-op Pain Management: Regional block* and Tylenol PO (pre-op)*   Induction: Intravenous  PONV Risk Score and Plan: 3 and Midazolam, Dexamethasone and Ondansetron  Airway Management Planned: Oral ETT  Additional Equipment:   Intra-op Plan:   Post-operative Plan: Extubation in OR  Informed Consent: I have reviewed the patients History and Physical, chart, labs and discussed the procedure including the risks, benefits and alternatives for the proposed anesthesia with the patient or authorized representative who has indicated his/her understanding and acceptance.     Dental advisory given  Plan Discussed with: CRNA  Anesthesia Plan Comments:        Anesthesia Quick Evaluation

## 2022-12-08 NOTE — Interval H&P Note (Signed)
All questions answered, patient wants to proceed with procedure. ? ?

## 2022-12-08 NOTE — Anesthesia Postprocedure Evaluation (Signed)
Anesthesia Post Note  Patient: Andrea Robertson  Procedure(s) Performed: SHOULDER ARTHROSCOPY WITH SUBACROMIAL DECOMPRESSION, ROTATOR CUFF REPAIR AND BICEP TENDON REPAIR (Left: Shoulder)     Patient location during evaluation: PACU Anesthesia Type: Regional and General Level of consciousness: awake and alert Pain management: pain level controlled Vital Signs Assessment: post-procedure vital signs reviewed and stable Respiratory status: spontaneous breathing, nonlabored ventilation, respiratory function stable and patient connected to nasal cannula oxygen Cardiovascular status: blood pressure returned to baseline and stable Postop Assessment: no apparent nausea or vomiting Anesthetic complications: no  No notable events documented.  Last Vitals:  Vitals:   12/08/22 1029 12/08/22 1050  BP: (!) 91/59 101/61  Pulse: 68 68  Resp: 16 16  Temp: (!) 36.2 C   SpO2: 92% 94%    Last Pain:  Vitals:   12/08/22 1029  TempSrc:   PainSc: 0-No pain                 Airi Copado L Shakiyla Kook

## 2022-12-08 NOTE — Transfer of Care (Signed)
Immediate Anesthesia Transfer of Care Note  Patient: Andrea Robertson  Procedure(s) Performed: SHOULDER ARTHROSCOPY WITH SUBACROMIAL DECOMPRESSION, ROTATOR CUFF REPAIR AND BICEP TENDON REPAIR (Left: Shoulder)  Patient Location: PACU  Anesthesia Type:GA combined with regional for post-op pain  Level of Consciousness: awake, alert , and oriented  Airway & Oxygen Therapy: Patient Spontanous Breathing and Patient connected to face mask oxygen  Post-op Assessment: Report given to RN and Post -op Vital signs reviewed and stable  Post vital signs: Reviewed and stable  Last Vitals:  Vitals Value Taken Time  BP 106/67 12/08/22 0917  Temp    Pulse 67 12/08/22 0926  Resp 21 12/08/22 0926  SpO2 100 % 12/08/22 0926  Vitals shown include unvalidated device data.  Last Pain:  Vitals:   12/08/22 0649  TempSrc: Oral  PainSc: 0-No pain         Complications: No notable events documented.

## 2022-12-09 ENCOUNTER — Encounter (HOSPITAL_BASED_OUTPATIENT_CLINIC_OR_DEPARTMENT_OTHER): Payer: Self-pay | Admitting: Orthopaedic Surgery

## 2022-12-12 DIAGNOSIS — M6281 Muscle weakness (generalized): Secondary | ICD-10-CM | POA: Diagnosis not present

## 2022-12-12 DIAGNOSIS — M7522 Bicipital tendinitis, left shoulder: Secondary | ICD-10-CM | POA: Diagnosis not present

## 2022-12-12 DIAGNOSIS — M25612 Stiffness of left shoulder, not elsewhere classified: Secondary | ICD-10-CM | POA: Diagnosis not present

## 2022-12-12 DIAGNOSIS — S46012D Strain of muscle(s) and tendon(s) of the rotator cuff of left shoulder, subsequent encounter: Secondary | ICD-10-CM | POA: Diagnosis not present

## 2022-12-14 DIAGNOSIS — M6281 Muscle weakness (generalized): Secondary | ICD-10-CM | POA: Diagnosis not present

## 2022-12-14 DIAGNOSIS — S46012D Strain of muscle(s) and tendon(s) of the rotator cuff of left shoulder, subsequent encounter: Secondary | ICD-10-CM | POA: Diagnosis not present

## 2022-12-14 DIAGNOSIS — M25612 Stiffness of left shoulder, not elsewhere classified: Secondary | ICD-10-CM | POA: Diagnosis not present

## 2022-12-14 DIAGNOSIS — M7522 Bicipital tendinitis, left shoulder: Secondary | ICD-10-CM | POA: Diagnosis not present

## 2022-12-15 ENCOUNTER — Encounter: Payer: Self-pay | Admitting: Nurse Practitioner

## 2022-12-15 DIAGNOSIS — M7522 Bicipital tendinitis, left shoulder: Secondary | ICD-10-CM | POA: Diagnosis not present

## 2022-12-20 ENCOUNTER — Other Ambulatory Visit: Payer: Self-pay

## 2022-12-20 DIAGNOSIS — N1831 Chronic kidney disease, stage 3a: Secondary | ICD-10-CM

## 2022-12-21 ENCOUNTER — Encounter: Payer: Self-pay | Admitting: Nurse Practitioner

## 2022-12-21 DIAGNOSIS — S46012D Strain of muscle(s) and tendon(s) of the rotator cuff of left shoulder, subsequent encounter: Secondary | ICD-10-CM | POA: Diagnosis not present

## 2022-12-21 DIAGNOSIS — M7522 Bicipital tendinitis, left shoulder: Secondary | ICD-10-CM | POA: Diagnosis not present

## 2022-12-21 DIAGNOSIS — M6281 Muscle weakness (generalized): Secondary | ICD-10-CM | POA: Diagnosis not present

## 2022-12-21 DIAGNOSIS — M25612 Stiffness of left shoulder, not elsewhere classified: Secondary | ICD-10-CM | POA: Diagnosis not present

## 2022-12-28 DIAGNOSIS — M7522 Bicipital tendinitis, left shoulder: Secondary | ICD-10-CM | POA: Diagnosis not present

## 2022-12-28 DIAGNOSIS — M6281 Muscle weakness (generalized): Secondary | ICD-10-CM | POA: Diagnosis not present

## 2022-12-28 DIAGNOSIS — S46012D Strain of muscle(s) and tendon(s) of the rotator cuff of left shoulder, subsequent encounter: Secondary | ICD-10-CM | POA: Diagnosis not present

## 2022-12-28 DIAGNOSIS — M25612 Stiffness of left shoulder, not elsewhere classified: Secondary | ICD-10-CM | POA: Diagnosis not present

## 2023-01-04 DIAGNOSIS — S46012D Strain of muscle(s) and tendon(s) of the rotator cuff of left shoulder, subsequent encounter: Secondary | ICD-10-CM | POA: Diagnosis not present

## 2023-01-04 DIAGNOSIS — M7522 Bicipital tendinitis, left shoulder: Secondary | ICD-10-CM | POA: Diagnosis not present

## 2023-01-04 DIAGNOSIS — M6281 Muscle weakness (generalized): Secondary | ICD-10-CM | POA: Diagnosis not present

## 2023-01-04 DIAGNOSIS — M25612 Stiffness of left shoulder, not elsewhere classified: Secondary | ICD-10-CM | POA: Diagnosis not present

## 2023-01-05 ENCOUNTER — Encounter: Payer: Self-pay | Admitting: Family Medicine

## 2023-01-05 ENCOUNTER — Ambulatory Visit
Admission: RE | Admit: 2023-01-05 | Discharge: 2023-01-05 | Disposition: A | Discharge status: Home / Self Care | Payer: BC Managed Care – PPO | Source: Ambulatory Visit | Attending: Family Medicine | Admitting: Family Medicine

## 2023-01-05 ENCOUNTER — Ambulatory Visit: Payer: BC Managed Care – PPO | Admitting: Family Medicine

## 2023-01-05 DIAGNOSIS — M549 Dorsalgia, unspecified: Secondary | ICD-10-CM

## 2023-01-05 DIAGNOSIS — M542 Cervicalgia: Secondary | ICD-10-CM

## 2023-01-05 DIAGNOSIS — M546 Pain in thoracic spine: Secondary | ICD-10-CM | POA: Diagnosis not present

## 2023-01-05 DIAGNOSIS — M62838 Other muscle spasm: Secondary | ICD-10-CM | POA: Diagnosis not present

## 2023-01-05 DIAGNOSIS — Z13828 Encounter for screening for other musculoskeletal disorder: Secondary | ICD-10-CM | POA: Diagnosis not present

## 2023-01-05 MED ORDER — METHOCARBAMOL 500 MG PO TABS
500.0000 mg | ORAL_TABLET | Freq: Three times a day (TID) | ORAL | 0 refills | Status: DC | PRN
Start: 2023-01-05 — End: 2023-05-02
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
## 2023-01-05 NOTE — Patient Instructions (Signed)
  Heating pad and stretches and shown. Take the Celebrex once daily for a week, if needed (stop when better). Take the methocarbamol if needed for muscle spasm. If persistent pain, we can order physical therapy for this problem.  Go to [facility] at [employer] for your x-rays.

## 2023-01-09 ENCOUNTER — Encounter: Payer: Self-pay | Admitting: Nurse Practitioner

## 2023-01-09 ENCOUNTER — Telehealth: Payer: Self-pay

## 2023-01-09 DIAGNOSIS — E1121 Type 2 diabetes mellitus with diabetic nephropathy: Secondary | ICD-10-CM

## 2023-01-09 DIAGNOSIS — N183 Chronic kidney disease, stage 3 unspecified: Secondary | ICD-10-CM

## 2023-01-09 DIAGNOSIS — E1169 Type 2 diabetes mellitus with other specified complication: Secondary | ICD-10-CM

## 2023-01-09 DIAGNOSIS — I5042 Chronic combined systolic (congestive) and diastolic (congestive) heart failure: Secondary | ICD-10-CM

## 2023-01-09 DIAGNOSIS — I428 Other cardiomyopathies: Secondary | ICD-10-CM

## 2023-01-09 DIAGNOSIS — N1831 Chronic kidney disease, stage 3a: Secondary | ICD-10-CM

## 2023-01-09 DIAGNOSIS — I25118 Atherosclerotic heart disease of native coronary artery with other forms of angina pectoris: Secondary | ICD-10-CM

## 2023-01-09 NOTE — Telephone Encounter (Signed)
Pt called to inquire if she can increase her dosage for semaglutide as the 1mg  doesn't seem to be working anymore.

## 2023-01-10 ENCOUNTER — Telehealth: Payer: Self-pay | Admitting: Family Medicine

## 2023-01-10 NOTE — Telephone Encounter (Signed)
Pt left message that it is time for her Ozempic refill and wants to increase the dosage.

## 2023-01-11 DIAGNOSIS — M7522 Bicipital tendinitis, left shoulder: Secondary | ICD-10-CM | POA: Diagnosis not present

## 2023-01-11 MED ORDER — SEMAGLUTIDE (2 MG/DOSE) 8 MG/3ML ~~LOC~~ SOPN
2.0000 mg | PEN_INJECTOR | SUBCUTANEOUS | 3 refills | Status: DC
Start: 2023-01-11 — End: 2024-01-29

## 2023-01-18 DIAGNOSIS — M6281 Muscle weakness (generalized): Secondary | ICD-10-CM | POA: Diagnosis not present

## 2023-01-18 DIAGNOSIS — M7522 Bicipital tendinitis, left shoulder: Secondary | ICD-10-CM | POA: Diagnosis not present

## 2023-01-18 DIAGNOSIS — S46012D Strain of muscle(s) and tendon(s) of the rotator cuff of left shoulder, subsequent encounter: Secondary | ICD-10-CM | POA: Diagnosis not present

## 2023-01-18 DIAGNOSIS — M25612 Stiffness of left shoulder, not elsewhere classified: Secondary | ICD-10-CM | POA: Diagnosis not present

## 2023-01-23 DIAGNOSIS — I129 Hypertensive chronic kidney disease with stage 1 through stage 4 chronic kidney disease, or unspecified chronic kidney disease: Secondary | ICD-10-CM | POA: Diagnosis not present

## 2023-01-23 DIAGNOSIS — E1122 Type 2 diabetes mellitus with diabetic chronic kidney disease: Secondary | ICD-10-CM | POA: Diagnosis not present

## 2023-01-23 DIAGNOSIS — I5042 Chronic combined systolic (congestive) and diastolic (congestive) heart failure: Secondary | ICD-10-CM | POA: Diagnosis not present

## 2023-01-23 DIAGNOSIS — N1831 Chronic kidney disease, stage 3a: Secondary | ICD-10-CM | POA: Diagnosis not present

## 2023-01-23 DIAGNOSIS — N1832 Chronic kidney disease, stage 3b: Secondary | ICD-10-CM | POA: Diagnosis not present

## 2023-01-24 ENCOUNTER — Ambulatory Visit (INDEPENDENT_AMBULATORY_CARE_PROVIDER_SITE_OTHER): Payer: BC Managed Care – PPO | Admitting: Neurology

## 2023-01-24 DIAGNOSIS — G4720 Circadian rhythm sleep disorder, unspecified type: Secondary | ICD-10-CM

## 2023-01-24 DIAGNOSIS — I428 Other cardiomyopathies: Secondary | ICD-10-CM

## 2023-01-24 DIAGNOSIS — G4730 Sleep apnea, unspecified: Secondary | ICD-10-CM

## 2023-01-24 DIAGNOSIS — I25118 Atherosclerotic heart disease of native coronary artery with other forms of angina pectoris: Secondary | ICD-10-CM

## 2023-01-24 DIAGNOSIS — E669 Obesity, unspecified: Secondary | ICD-10-CM

## 2023-01-24 DIAGNOSIS — I252 Old myocardial infarction: Secondary | ICD-10-CM

## 2023-01-24 DIAGNOSIS — I5042 Chronic combined systolic (congestive) and diastolic (congestive) heart failure: Secondary | ICD-10-CM

## 2023-01-24 DIAGNOSIS — G4733 Obstructive sleep apnea (adult) (pediatric): Secondary | ICD-10-CM | POA: Diagnosis not present

## 2023-01-24 DIAGNOSIS — Z9189 Other specified personal risk factors, not elsewhere classified: Secondary | ICD-10-CM

## 2023-01-24 DIAGNOSIS — R0683 Snoring: Secondary | ICD-10-CM

## 2023-01-24 DIAGNOSIS — R0681 Apnea, not elsewhere classified: Secondary | ICD-10-CM

## 2023-01-24 LAB — LAB REPORT - SCANNED
Creatinine, POC: 119.4 mg/dL
EGFR: 45

## 2023-01-27 DIAGNOSIS — M7522 Bicipital tendinitis, left shoulder: Secondary | ICD-10-CM | POA: Diagnosis not present

## 2023-01-30 ENCOUNTER — Other Ambulatory Visit (INDEPENDENT_AMBULATORY_CARE_PROVIDER_SITE_OTHER): Payer: BC Managed Care – PPO

## 2023-01-30 ENCOUNTER — Other Ambulatory Visit: Payer: Self-pay

## 2023-01-30 DIAGNOSIS — N1831 Chronic kidney disease, stage 3a: Secondary | ICD-10-CM

## 2023-01-30 DIAGNOSIS — E1122 Type 2 diabetes mellitus with diabetic chronic kidney disease: Secondary | ICD-10-CM | POA: Diagnosis not present

## 2023-01-30 NOTE — Procedures (Signed)
Physician Interpretation:     [facility] POLYSOMNOGRAPHY  INTERPRETATION REPORT   STUDY DATE:  01/24/2023     PATIENT NAME:  Andrea Robertson         DATE OF BIRTH:  August 25, 1960  PATIENT ID:  [id number]    TYPE OF STUDY:  PSG  READING PHYSICIAN: Huston Foley, MD, PhD   SCORING TECHNICIAN: Margaretann Loveless, RPSGT   Referred by: Tollie Eth, NP  ? History and Indication for Testing: 62 year old female with an underlying complex medical history of diabetes, hypertension, chronic kidney disease, hyperlipidemia, nonischemic cardiomyopathy, coronary artery disease with history of MI, status post stent placement, sickle cell trait, depression, anxiety, reflux disease, allergies, palpitations, and obesity, who reports snoring and excessive daytime somnolence as well as witnessed apneas per husband's report. Her Epworth sleepiness score is 4 out of 24, fatigue severity score is 14 out of 63.  Height: 62 in Weight: 188 lb (BMI 34) Neck Size: 15 in.    MEDICATIONS: Norvasc, Aspirin, Lipitor, Wellbutrin XL, Coreg, Farxiga, Colace, Lexapro, Zetia, Multivitamin, Entresto, Ozempic, Allegra, Nitrostat, Zofran   TECHNICAL DESCRIPTION: A registered sleep technologist was in attendance for the duration of the recording.  Data collection, scoring, video monitoring, and reporting were performed in compliance with the AASM Manual for the Scoring of Sleep and Associated Events; (Hypopnea is scored based on the criteria listed in Section VIII D. 1b in the AASM Manual V2.6 using a 4% oxygen desaturation rule or Hypopnea is scored based on the criteria listed in Section VIII D. 1a in the AASM Manual V2.6 using 3% oxygen desaturation and /or arousal rule).   SLEEP CONTINUITY AND SLEEP ARCHITECTURE:  Lights-out was at 22:18: and lights-on at  05:02:, with a total recording time of 6 hours, 44.5 min. Total sleep time ( TST) was 298.5 minutes with a decreased sleep efficiency at 73.8%. There  was  16.8% REM sleep.  BODY POSITION:  TST was divided  between the following sleep positions: 4.4% supine;  85.6% lateral;  10% prone. Duration of total sleep and percent of total sleep in their respective position is as follows: supine 13 minutes (4%), non-supine 286 minutes (96%); right 151 minutes (51%), left 104 minutes (35%), and prone 30 minutes (10%).  Total supine REM sleep time was 00 minutes (0% of total REM sleep).  Sleep latency was increased at 43.0 minutes.  REM sleep latency was increased at 240.0 minutes. Of the total sleep time, the percentage of stage N1 sleep was 13.7%, which is increased, stage N2 sleep was 68%, which is increased, stage N3 sleep was 2.0%, which is reduced, and REM sleep was 16.8%, which is reduced. Wake after sleep onset (WASO) time accounted for 63 minutes with sleep fragmentation noted, ranging from minimal to moderate, at times.   RESPIRATORY MONITORING:   Based on CMS criteria (using a 4% oxygen desaturation rule for scoring hypopneas), there were 1 apneas (1 obstructive; 0 central; 0 mixed), and 31 hypopneas.  Apnea index was 0.2. Hypopnea index was 6.2. The apnea-hypopnea index was 6.4 overall (96.9 supine, 2 non-supine; 2.4 REM, 0.0 supine REM).  There were 0 respiratory effort-related arousals (RERAs).  The RERA index was 0 events/h. Total respiratory disturbance index (RDI) was 6.4 events/h. RDI results showed: supine RDI  96.9 /h; non-supine RDI 2.3 /h; REM RDI 2.4 /h, supine REM RDI 0.0 /h.   Based on AASM criteria (using a 3% oxygen desaturation and /or arousal rule for scoring hypopneas), there  were 1 apneas (1 obstructive; 0 central; 0 mixed), and 49 hypopneas. Apnea index was 0.2. Hypopnea index was 9.8. The apnea-hypopnea index was 10.1/hour overall (96.9 supine, 7 non-supine; 7.2 REM, 0.0 supine REM).  There were 0 respiratory effort-related arousals (RERAs).  The RERA index was 0 events/h. Total respiratory disturbance index (RDI) was 10.1 events/h.  RDI results showed: supine RDI  96.9 /h; non-supine RDI 6.1 /h; REM RDI 7.2 /h, supine REM RDI 0.0 /h.    OXIMETRY: Oxyhemoglobin Saturation Nadir during sleep was at  87% from a mean of 96%.  Of the Total sleep time (TST)   hypoxemia (=<88%) was present for  0.1 minutes, or 0.0% of total sleep time.   LIMB MOVEMENTS: There were 0 periodic limb movements of sleep (0.0/hr), of which 0 (0.0/hr) were associated with an arousal.   AROUSAL: There were 53 arousals in total, for an arousal index of 11 arousals/hour.  Of these, 14 were identified as respiratory-related arousals (3 /h), 0 were PLM-related arousals (0 /h), and 49 were non-specific arousals (10 /h).   EEG: Review of the EEG showed no abnormal electrical discharges and symmetrical bihemispheric findings.    EKG: The EKG revealed normal sinus rhythm (NSR). The average heart rate during sleep was 76 bpm.   AUDIO/VIDEO REVIEW: The audio and video review did not show any abnormal or unusual behaviors, movements, phonations or vocalizations. The patient took 1 restroom break.  Snoring was noted in the mild to moderate range.   POST-STUDY QUESTIONNAIRE: Post study, the patient indicated, that sleep was worse than usual.    IMPRESSION:  1. Mild Obstructive Sleep Apnea (OSA) 2. Dysfunctions associated with sleep stages or arousal from sleep  RECOMMENDATIONS:  1. This study demonstrates overall mild obstructive sleep apnea, more pronounced during supine sleep with a total AHI of 10/h, REM AHI of 7.2/h, supine AHI of 96.9/h, O2 nadir 87%.  Snoring was in the mild to moderate range.  Given the patient's medical history and sleep related complaints, treatment with positive airway pressure is recommended; this can be achieved in the form of autoPAP. A full-night, laboratory attended CPAP titration study would allow optimization of therapy if needed, down the road. Other treatment options may include avoidance of supine sleep position along with weight  loss, or the use of an oral appliance in selected patients. Please note, that untreated obstructive sleep apnea may carry additional perioperative morbidity. Patients with significant obstructive sleep apnea should receive perioperative PAP therapy and the surgeons and particularly the anesthesiologist should be informed of the diagnosis and the severity of the sleep disordered breathing. 2. This study shows some sleep fragmentation and abnormal sleep stage percentages; these are nonspecific findings and per se do not signify an intrinsic sleep disorder or a cause for the patient's sleep-related symptoms. Causes include (but are not limited to) the first night effect of the sleep study, circadian rhythm disturbances, medication effect or an underlying mood disorder or medical problem.  3. The patient should be cautioned not to drive, work at heights, or operate dangerous or heavy equipment when tired or sleepy. Review and reiteration of good sleep hygiene measures should be pursued with any patient. 4. The patient will be seen in follow-up by Dr. Frances Furbish at [facility] for discussion of the test results and further management strategies. The referring provider will be notified of the test results.   I certify that I have reviewed the entire raw data recording prior to the issuance of this report in  accordance with the Standards of Accreditation of the American Academy of Sleep Medicine (AASM).  Huston Foley, MD, PhD Medical Director, [facility]) Diplomat, ABPN (Neurology and Sleep)               Technical Report:   General Information  Name: Lorenza, Propes BMI: 16.10 Physician: Huston Foley, MD  ID: [id number] Height: 62.0 in Technician: Margaretann Loveless, RPSGT  Sex: Female Weight: 188.0 lb Record: [id number]  Age: 62 [07-Apr-1961] Date: 01/24/2023    Medical & Medication History    62 year old female with an underlying complex medical history of diabetes,  hypertension, chronic kidney disease, hyperlipidemia, nonischemic cardiomyopathy, coronary artery disease with history of MI, status post stent placement, sickle cell trait, depression, anxiety, reflux disease, allergies, palpitations, and obesity, who reports snoring and excessive daytime somnolence as well as witnessed apneas per husband's report. She had a sleep study several years ago, approximately 15 or 20 years ago which was negative for sleep apnea at the time. Prior sleep study results are not available for my review today. She has no recurrent morning headaches, no night to night nocturia.  Norvasc, Aspirin, Lipitor, Wellbutrin XL, Coreg, Farxiga, Colace, Lexapro, Zetia, Multivitamin, Entresto, Ozempic, Allegra, Nitrostat, Zofran   Sleep Disorder      Comments   The patient came into the sleep lab for a PSG. The patient took Wellbutrin prior to start of sleep study. One restroom break. EKG kept in NSR. Mild to moderate snoring. A few leg movements witnessed. Respiratory events scored with a 3% desat. Majority of respiratory events were while in the supine position. The patient slept supine, prone and lateral. All sleep stages observed. AHI was 15 after 2 hrs of TST.     Lights out: 10:18:09 PM Lights on: 05:02:40 AM   Time Total Supine Side Prone Upright  Recording (TRT) 6h 44.40m 0h 57.2m 5h 1.37m 0h 46.34m 0h 0.38m  Sleep (TST) 4h 58.49m 0h 13.31m 4h 15.15m 0h 30.56m 0h 0.74m   Latency N1 N2 N3 REM Onset Per. Slp. Eff.  Actual 0h 0.53m 0h 7.39m 0h 21.75m 4h 0.42m 0h 43.93m 0h 48.34m 73.79%   Stg Dur Wake N1 N2 N3 REM  Total 106.0 41.0 201.5 6.0 50.0  Supine 44.0 4.0 9.0 0.0 0.0  Side 45.5 32.5 167.0 6.0 50.0  Prone 16.5 4.5 25.5 0.0 0.0  Upright 0.0 0.0 0.0 0.0 0.0   Stg % Wake N1 N2 N3 REM  Total 26.2 13.7 67.5 2.0 16.8  Supine 10.9 1.3 3.0 0.0 0.0  Side 11.2 10.9 55.9 2.0 16.8  Prone 4.1 1.5 8.5 0.0 0.0  Upright 0.0 0.0 0.0 0.0 0.0     Apnea Summary Sub Supine Side Prone Upright  Total  1 Total 1 0 1 0 0    REM 0 0 0 0 0    NREM 1 0 1 0 0  Obs 1 REM 0 0 0 0 0    NREM 1 0 1 0 0  Mix 0 REM 0 0 0 0 0    NREM 0 0 0 0 0  Cen 0 REM 0 0 0 0 0    NREM 0 0 0 0 0   Rera Summary Sub Supine Side Prone Upright  Total 0 Total 0 0 0 0 0    REM 0 0 0 0 0    NREM 0 0 0 0 0   Hypopnea Summary Sub Supine Side Prone Upright  Total 49 Total  49 21 28 0 0    REM 6 0 6 0 0    NREM 43 21 22 0 0   4% Hypopnea Summary Sub Supine Side Prone Upright  Total (4%) 31 Total 31 21 10  0 0    REM 2 0 2 0 0    NREM 29 21 8  0 0     AHI Total Obs Mix Cen  10.05 Apnea 0.20 0.20 0.00 0.00   Hypopnea 9.85 -- -- --  6.43 Hypopnea (4%) 6.23 -- -- --    Total Supine Side Prone Upright  Position AHI 10.05 96.92 6.81 0.00 0.00  REM AHI 7.20   NREM AHI 10.62   Position RDI 10.05 96.92 6.81 0.00 0.00  REM RDI 7.20   NREM RDI 10.62    4% Hypopnea Total Supine Side Prone Upright  Position AHI (4%) 6.43 96.92 2.58 0.00 0.00  REM AHI (4%) 2.40   NREM AHI (4%) 7.24   Position RDI (4%) 6.43 96.92 2.58 0.00 0.00  REM RDI (4%) 2.40   NREM RDI (4%) 7.24    Desaturation Information Threshold: 2% <100% <90% <80% <70% <60% <50% <40%  Supine 43.0 0.0 0.0 0.0 0.0 0.0 0.0  Side 117.0 0.0 0.0 0.0 0.0 0.0 0.0  Prone 5.0 0.0 0.0 0.0 0.0 0.0 0.0  Upright 0.0 0.0 0.0 0.0 0.0 0.0 0.0  Total 165.0 0.0 0.0 0.0 0.0 0.0 0.0  Index 27.4 0.0 0.0 0.0 0.0 0.0 0.0   Threshold: 3% <100% <90% <80% <70% <60% <50% <40%  Supine 34.0 0.0 0.0 0.0 0.0 0.0 0.0  Side 44.0 0.0 0.0 0.0 0.0 0.0 0.0  Prone 1.0 0.0 0.0 0.0 0.0 0.0 0.0  Upright 0.0 0.0 0.0 0.0 0.0 0.0 0.0  Total 79.0 0.0 0.0 0.0 0.0 0.0 0.0  Index 13.1 0.0 0.0 0.0 0.0 0.0 0.0   Threshold: 4% <100% <90% <80% <70% <60% <50% <40%  Supine 31.0 0.0 0.0 0.0 0.0 0.0 0.0  Side 18.0 0.0 0.0 0.0 0.0 0.0 0.0  Prone 1.0 0.0 0.0 0.0 0.0 0.0 0.0  Upright 0.0 0.0 0.0 0.0 0.0 0.0 0.0  Total 50.0 0.0 0.0 0.0 0.0 0.0 0.0  Index 8.3 0.0 0.0 0.0 0.0 0.0 0.0   Threshold: 3%  <100% <90% <80% <70% <60% <50% <40%  Supine 34 0 0 0 0 0 0  Side 44 0 0 0 0 0 0  Prone 1 0 0 0 0 0 0  Upright 0 0 0 0 0 0 0  Total 79 0 0 0 0 0 0   Awakening/Arousal Information # of Awakenings 22  Wake after sleep onset 63.4m  Wake after persistent sleep 59.38m   Arousal Assoc. Arousals Index  Apneas 0 0.0  Hypopneas 14 2.8  Leg Movements 1 0.2  Snore 0 0.0  PTT Arousals 0 0.0  Spontaneous 49 9.8  Total 64 12.9  Leg Movement Information PLMS LMs Index  Total LMs during PLMS 0 0.0  LMs w/ Microarousals 0 0.0   LM LMs Index  w/ Microarousal 1 0.2  w/ Awakening 0 0.0  w/ Resp Event 0 0.0  Spontaneous 32 6.4  Total 33 6.6     Desaturation threshold setting: 3% Minimum desaturation setting: 10 seconds SaO2 nadir: 87% The longest event was a 59 sec obstructive Hypopnea with a minimum SaO2 of 95%. The lowest SaO2 was 91% associated with a 27 sec obstructive Hypopnea. EKG Rates EKG Avg Max Min  Awake 77 98 69  Asleep 76 92 67  EKG Events: Tachycardia

## 2023-01-30 NOTE — Addendum Note (Signed)
Addended by: Huston Foley on: 01/30/2023 06:36 PM   Modules accepted: Orders

## 2023-01-31 DIAGNOSIS — M7522 Bicipital tendinitis, left shoulder: Secondary | ICD-10-CM | POA: Diagnosis not present

## 2023-01-31 DIAGNOSIS — M6281 Muscle weakness (generalized): Secondary | ICD-10-CM | POA: Diagnosis not present

## 2023-01-31 DIAGNOSIS — M25612 Stiffness of left shoulder, not elsewhere classified: Secondary | ICD-10-CM | POA: Diagnosis not present

## 2023-01-31 DIAGNOSIS — S46012D Strain of muscle(s) and tendon(s) of the rotator cuff of left shoulder, subsequent encounter: Secondary | ICD-10-CM | POA: Diagnosis not present

## 2023-01-31 LAB — COMPREHENSIVE METABOLIC PANEL
ALT: 41 U/L — ABNORMAL HIGH (ref 0–35)
AST: 32 U/L (ref 0–37)
Albumin: 4 g/dL (ref 3.5–5.2)
Alkaline Phosphatase: 91 U/L (ref 39–117)
BUN: 19 mg/dL (ref 6–23)
CO2: 27 mEq/L (ref 19–32)
Calcium: 8.7 mg/dL (ref 8.4–10.5)
Chloride: 103 mEq/L (ref 96–112)
Creatinine, Ser: 1.51 mg/dL — ABNORMAL HIGH (ref 0.40–1.20)
GFR: 37.59 mL/min — ABNORMAL LOW (ref >60.00)
Glucose, Bld: 103 mg/dL — ABNORMAL HIGH (ref 70–99)
Potassium: 3.5 mEq/L (ref 3.5–5.1)
Sodium: 138 mEq/L (ref 135–145)
Total Bilirubin: 0.6 mg/dL (ref 0.2–1.2)
Total Protein: 6.9 g/dL (ref 6.0–8.3)

## 2023-01-31 LAB — LIPID PANEL
Cholesterol: 129 mg/dL (ref 0–200)
HDL: 50.1 mg/dL (ref >39.00)
LDL Cholesterol: 67 mg/dL (ref 0–99)
NonHDL: 78.87
Total CHOL/HDL Ratio: 3
Triglycerides: 60 mg/dL (ref 0.0–149.0)
VLDL: 12 mg/dL (ref 0.0–40.0)

## 2023-01-31 LAB — HEMOGLOBIN A1C: Hgb A1c MFr Bld: 5.9 % (ref 4.6–6.5)

## 2023-01-31 LAB — MICROALBUMIN / CREATININE URINE RATIO
Creatinine,U: 128.7 mg/dL
Microalb Creat Ratio: 0.5 mg/g (ref 0.0–30.0)
Microalb, Ur: <0.7 mg/dL (ref 0.0–1.9)

## 2023-02-01 ENCOUNTER — Telehealth: Payer: Self-pay | Admitting: *Deleted

## 2023-02-01 ENCOUNTER — Ambulatory Visit: Payer: BC Managed Care – PPO | Admitting: "Endocrinology

## 2023-02-01 ENCOUNTER — Encounter: Payer: Self-pay | Admitting: "Endocrinology

## 2023-02-01 VITALS — BP 120/70 | HR 69 | Wt 178.6 lb

## 2023-02-01 DIAGNOSIS — E7800 Pure hypercholesterolemia, unspecified: Secondary | ICD-10-CM

## 2023-02-01 DIAGNOSIS — Z7985 Long-term (current) use of injectable non-insulin antidiabetic drugs: Secondary | ICD-10-CM

## 2023-02-01 DIAGNOSIS — E1121 Type 2 diabetes mellitus with diabetic nephropathy: Secondary | ICD-10-CM

## 2023-02-01 NOTE — Patient Instructions (Signed)

## 2023-02-01 NOTE — Telephone Encounter (Signed)
-----   Message from Huston Foley, MD sent at 01/30/2023  6:36 PM EDT ----- Patient referred by PCP, seen by me on 10/04/22, diagnostic PSG on 01/24/23.    Please call and notify the patient that the recent sleep study showed obstructive sleep apnea. OSA is overall mild, but worth treating to see if she feels better after treatment. To that end I recommend treatment for this in the form of autoPAP, which means, that we don't have to bring her back for a second sleep study with CPAP, but will let her try an autoPAP machine at home, through a DME company (of her choice, or as per insurance requirement). The DME representative will educate her on how to use the machine, how to put the mask on, etc. I have placed an order in the chart. Please send referral, talk to patient, send report to referring MD. We will need a FU in sleep clinic for 10 weeks post-PAP set up, please arrange that with me or one of our NPs. Thanks,   Huston Foley, MD, PhD [facility])

## 2023-02-01 NOTE — Telephone Encounter (Signed)
I spoke with the patient and discussed her sleep study results.  The patient understands that her obstructive sleep apnea is overall mild but worth treating to see if she feels better after treatment.  Patient is amenable to proceeding with a referral to a DME company.  Her questions were answered.  We discussed the difference between CPAP and AutoPap.  The patient was advised of the the insurance compliance requirements which includes using the machine at least 4 hours at night and also being seen in our office for an initial follow-up appointment.  We scheduled the follow-up for September 11 at 9 AM arrival at least 15 to 30 minutes early.  Patient did not have a preference for DME company.  We will refer to [facility] and I provided pt with their number to call if she doesn't hear from them within 1 week. Patient verbalized appreciation for the call.   Autopap referral sent to [facility]. Sleep study results sent to referring provider.

## 2023-02-01 NOTE — Telephone Encounter (Signed)
[facility] confirmed receipt of order.  

## 2023-02-01 NOTE — Progress Notes (Signed)
Outpatient Endocrinology Note [name], MD  02/01/23   Andrea Robertson July 19, 1961 [id number]  Referring Provider: Tollie Eth, NP Primary Care Provider: Tollie Eth, NP Reason for consultation: Subjective   Assessment & Plan  Shianne was seen today for diabetes.  Diagnoses and all orders for this visit:  Controlled type 2 diabetes mellitus with diabetic nephropathy, without long-term current use of insulin (HCC)  Pure hypercholesterolemia    Diabetes complicated by nephropathy, Gfr 46,  h/o MI Hba1c goal less than 7.0, current Hba1c is 5.9 . Will recommend the following: Ozempic 2 mg weekly   Discussed diet control and lifestyle goals at length  Has DexCom but report half the time it doesn't work  Previously on [employer] but has allergy to it   No known contraindications to any of above medications No history of MEN syndrome/medullary thyroid cancer/pancreatitis or pancreatic cancer in self or family  Hyperlipidemia -Last LDL at goal: 27 -on atorvastatin 80 mg QD, ezetimibe 10 mg everyday  -Follow low fat diet and exercise   -Blood pressure goal <140/90 - Microalbumin/creatinine at goal < 30 - on ACE/ARB sacubitril-valsartan 49-51mg  twice a day by another provider  -diet changes including salt restriction -limit eating outside -counseled BP targets per standards of diabetes care -Uncontrolled blood pressure can lead to retinopathy, nephropathy and cardiovascular and atherosclerotic heart disease  Reviewed and counseled on: -A1C target -Blood sugar targets -Complications of uncontrolled diabetes  -Checking blood sugar before meals and bedtime and bring log next visit -All medications with mechanism of action and side effects -Hypoglycemia management: rule of 15's, Glucagon Emergency Kit and medical alert ID -low-carb low-fat plate-method diet -At least 20 minutes of physical activity per day -Annual dilated retinal eye exam and foot  exam -compliance and follow up needs -follow up as scheduled or earlier if problem gets worse  Call if blood sugar is less than 70 or consistently above 250    Take a 15 gm snack of carbohydrate at bedtime before you go to sleep if your blood sugar is less than 100.    If you are going to fast after midnight for a test or procedure, ask your physician for instructions on how to reduce/decrease your insulin dose.    Call if blood sugar is less than 70 or consistently above 250  -Treating a low sugar by rule of 15  (15 gms of sugar every 15 min until sugar is more than 70) If you feel your sugar is low, test your sugar to be sure If your sugar is low (less than 70), then take 15 grams of a fast acting Carbohydrate (3-4 glucose tablets or glucose gel or 4 ounces of juice or regular soda) Recheck your sugar 15 min after treating low to make sure it is more than 70 If sugar is still less than 70, treat again with 15 grams of carbohydrate          Don't drive the hour of hypoglycemia  If unconscious/unable to eat or drink by mouth, use glucagon injection or nasal spray baqsimi and call 911. Can repeat again in 15 min if still unconscious.  No follow-ups on file. Pt wants to follow up with cardiologist who refills her meds    I have reviewed current medications, nurse's notes, allergies, vital signs, past medical and surgical history, family medical history, and social history for this encounter. Counseled patient on symptoms, examination findings, lab findings, imaging results, treatment decisions and monitoring and prognosis.  The patient understood the recommendations and agrees with the treatment plan. All questions regarding treatment plan were fully answered.  Altamese Derma, MD  02/01/23    History of Present Illness Andrea Robertson is a 62 y.o. year old female who presents for evaluation of Type 2 diabetes mellitus.  Latonya Tauer was first diagnosed around 2021, had  pre-diabetes before that for about 7 years.  Diabetes education +  Home diabetes regimen: Ozempic 2 mg weekly   COMPLICATIONS +  MI, - Stroke -  retinopathy -  neuropathy +  nephropathy  SYMPTOMS REVIEWED - Polyuria - Weight loss - Blurred vision  BLOOD SUGAR DATA No DexCom data   Physical Exam  BP 120/70   Pulse 69   Wt 178 lb 9.6 oz (81 kg)   SpO2 99%   BMI 32.67 kg/m    Constitutional: well developed, well nourished Head: normocephalic, atraumatic Eyes: sclera anicteric, no redness Neck: supple Lungs: normal respiratory effort Neurology: alert and oriented Skin: dry, no appreciable rashes Musculoskeletal: no appreciable defects Psychiatric: normal mood and affect  Current Medications Patient's Medications  New Prescriptions   No medications on file  Previous Medications   AMLODIPINE (NORVASC) 10 MG TABLET    TAKE 1 TABLET BY MOUTH EVERY DAY   ASPIRIN EC 81 MG EC TABLET    Take 1 tablet (81 mg total) by mouth daily.   ATORVASTATIN (LIPITOR) 80 MG TABLET    Take 1 tablet by mouth daily.   BUPROPION (WELLBUTRIN XL) 300 MG 24 HR TABLET    Take 1 tablet (300 mg total) by mouth daily.   CARVEDILOL (COREG) 25 MG TABLET    TAKE 1&1/2 TABLETS BY MOUTH TWICE A DAY   DAPAGLIFLOZIN PROPANEDIOL (FARXIGA) 10 MG TABS TABLET    Take 1 tablet (10 mg total) by mouth daily before breakfast.   DIPHENHYDRAMINE (BENADRYL ALLERGY) 25 MG CAPSULE    Take 1 capsule (25 mg total) by mouth every 6 (six) hours as needed for itching.   DOCUSATE SODIUM (COLACE) 100 MG CAPSULE    Take 100 mg by mouth 2 (two) times daily.   ESCITALOPRAM (LEXAPRO) 20 MG TABLET    Take 1 tablet (20 mg total) by mouth every morning.   EZETIMIBE (ZETIA) 10 MG TABLET    TAKE 1 TABLET BY MOUTH EVERY DAY   METHOCARBAMOL (ROBAXIN) 500 MG TABLET    Take 1-2 tablets (500-1,000 mg total) by mouth every 8 (eight) hours as needed for muscle spasms.   MULTIPLE VITAMIN (MULTIVITAMIN WITH MINERALS) TABS TABLET    Take 1  tablet by mouth every morning.   NITROGLYCERIN (NITROSTAT) 0.4 MG SL TABLET    Place 1 tablet (0.4 mg total) under the tongue every 5 (five) minutes x 3 doses as needed for chest pain.   SACUBITRIL-VALSARTAN (ENTRESTO) 49-51 MG    TAKE 1 TABLET BY MOUTH TWICE A DAY   SEMAGLUTIDE, 2 MG/DOSE, 8 MG/3ML SOPN    Inject 2 mg as directed once a week.  Modified Medications   No medications on file  Discontinued Medications   No medications on file    Allergies Allergies  Allergen Reactions   Dilaudid [Hydromorphone Hcl] Hives    Can tolerate if premedicated with benadryl   Erythromycin Hives and Nausea And Vomiting   Percocet [Oxycodone-Acetaminophen] Itching and Nausea And Vomiting    Can tolerate with premedication of diphenhydramine and/or nausea medication   Zithromax [Azithromycin] Nausea And Vomiting   Penicillins Hives, Nausea And  Vomiting and Rash   Codeine Nausea Only   Oxycodone Other (See Comments)    Other reaction(s): nausea, epigastric pain   Cefprozil Rash    rash but takes Ceftin   Other Rash and Other (See Comments)    Gel on Ekg pads    Past Medical History Past Medical History:  Diagnosis Date   AKI (acute kidney injury) (HCC) 03/09/2021   Anxiety    Arthritis    Chronic kidney disease    Coronary artery disease involving native coronary artery of native heart without angina pectoris 07/04/2019   COVID-19 04/19/2022   Depression    Diabetes mellitus without complication (HCC)    Encounter for annual physical exam 05/17/2022   Essential hypertension 10/30/2018   Essential hypertension 10/30/2018   Hyperlipidemia 07/04/2019   Ischemic cardiomyopathy    Myocardial infarction [facility])    Other allergy status, other than to drugs and biological substances 11/10/2020   Palpitations    Perforation of sigmoid colon due to diverticulitis 12/23/2020   Primary localized osteoarthritis of left knee    Primary localized osteoarthritis of left knee    Recurrent major  depression in remission (HCC) 11/10/2020   S/P total knee arthroplasty, right    Shortness of breath 03/09/2021   TOA (tubo-ovarian abscess) 12/07/2021    Past Surgical History Past Surgical History:  Procedure Laterality Date   ABDOMINAL HYSTERECTOMY     CHOLECYSTECTOMY     CORONARY STENT INTERVENTION N/A 07/03/2019   Procedure: CORONARY STENT INTERVENTION;  Surgeon: Tonny Bollman, MD;  Location[facility] CV LAB;  Service: Cardiovascular;  Laterality: N/A;   LEFT HEART CATH AND CORONARY ANGIOGRAPHY N/A 07/03/2019   Procedure: LEFT HEART CATH AND CORONARY ANGIOGRAPHY;  Surgeon: Tonny Bollman, MD;  Location: [facility] INVASIVE CV LAB;  Service: Cardiovascular;  Laterality: N/A;   RIGHT/LEFT HEART CATH AND CORONARY ANGIOGRAPHY N/A 07/27/2021   Procedure: RIGHT/LEFT HEART CATH AND CORONARY ANGIOGRAPHY;  Surgeon: Yates Decamp, MD;  Location: [facility] INVASIVE CV LAB;  Service: Cardiovascular;  Laterality: N/A;   SHOULDER ARTHROSCOPY WITH SUBACROMIAL DECOMPRESSION, ROTATOR CUFF REPAIR AND BICEP TENDON REPAIR Left 12/08/2022   Procedure: SHOULDER ARTHROSCOPY WITH SUBACROMIAL DECOMPRESSION, ROTATOR CUFF REPAIR AND BICEP TENDON REPAIR;  Surgeon: Bjorn Pippin, MD;  Location: [facility];  Service: Orthopedics;  Laterality: Left;   TOTAL KNEE ARTHROPLASTY Right 10/13/2015   Procedure: RIGHT TOTAL KNEE ARTHROPLASTY;  Surgeon: Ranee Gosselin, MD;  Location: [facility];  Service: Orthopedics;  Laterality: Right;   TOTAL KNEE ARTHROPLASTY Left 11/23/2020   Procedure: TOTAL KNEE ARTHROPLASTY;  Surgeon: Salvatore Marvel, MD;  Location: [facility];  Service: Orthopedics;  Laterality: Left;    Family History family history includes CAD in her father; Heart attack (age of onset: 58) in her father; Heart disease in her mother; Heart failure in her brother; Hypertension in her sister, sister, and sister; Multiple myeloma in her mother.  Social History Social History   Socioeconomic History   Marital status:  Married    Spouse name: Not on file   Number of children: 2   Years of education: Not on file   Highest education level: Not on file  Occupational History   Not on file  Tobacco Use   Smoking status: Never   Smokeless tobacco: Never  Vaping Use   Vaping Use: Never used  Substance and Sexual Activity   Alcohol use: Yes    Comment: occ   Drug use: No   Sexual activity: Yes  Birth control/protection: Surgical    Comment: Hyst  Other Topics Concern   Not on file  Social History Narrative   Not on file   Social Determinants of Health   Financial Resource Strain: Not on file  Food Insecurity: No Food Insecurity (10/08/2019)   Hunger Vital Sign    Worried About Running Out of Food in the Last Year: Never true    Ran Out of Food in the Last Year: Never true  Transportation Needs: No Transportation Needs (10/08/2019)   PRAPARE - Administrator, Civil Service (Medical): No    Lack of Transportation (Non-Medical): No  Physical Activity: Not on file  Stress: No Stress Concern Present (10/08/2019)   [employer] of Occupational Health - Occupational Stress Questionnaire    Feeling of Stress : Only a little  Social Connections: Socially Integrated (10/08/2019)   Social Connection and Isolation Panel [NHANES]    Frequency of Communication with Friends and Family: More than three times a week    Frequency of Social Gatherings with Friends and Family: Three times a week    Attends Religious Services: More than 4 times per year    Active Member of Clubs or Organizations: No    Attends Banker Meetings: More than 4 times per year    Marital Status: Married  Intimate Partner Violence: Not on file    Lab Results  Component Value Date   HGBA1C 5.9 01/30/2023   HGBA1C 7.1 (A) 05/17/2022   HGBA1C 6.8 (H) 11/11/2020   Lab Results  Component Value Date   CHOL 129 01/30/2023   Lab Results  Component Value Date   HDL 50.10 01/30/2023   Lab Results   Component Value Date   LDLCALC 67 01/30/2023   Lab Results  Component Value Date   TRIG 60.0 01/30/2023   Lab Results  Component Value Date   CHOLHDL 3 01/30/2023   Lab Results  Component Value Date   CREATININE 1.51 (H) 01/30/2023   Lab Results  Component Value Date   GFR 37.59 (L) 01/30/2023   Lab Results  Component Value Date   MICROALBUR <0.7 01/30/2023      Component Value Date/Time   NA 138 01/30/2023 1449   NA 143 10/04/2022 1019   K 3.5 01/30/2023 1449   CL 103 01/30/2023 1449   CO2 27 01/30/2023 1449   GLUCOSE 103 (H) 01/30/2023 1449   BUN 19 01/30/2023 1449   BUN 14 10/04/2022 1019   CREATININE 1.51 (H) 01/30/2023 1449   CALCIUM 8.7 01/30/2023 1449   PROT 6.9 01/30/2023 1449   ALBUMIN 4.0 01/30/2023 1449   AST 32 01/30/2023 1449   ALT 41 (H) 01/30/2023 1449   ALKPHOS 91 01/30/2023 1449   BILITOT 0.6 01/30/2023 1449   GFRNONAA 46 (L) 12/06/2022 0933   GFRAA 39 (L) 08/28/2019 1021      Latest Ref Rng & Units 01/30/2023    2:49 PM 12/06/2022    9:33 AM 10/04/2022   10:19 AM  BMP  Glucose 70 - 99 mg/dL 161  096  045   BUN 6 - 23 mg/dL 19  16  14    Creatinine 0.40 - 1.20 mg/dL 4.09  8.11  9.14   BUN/Creat Ratio 9 - 23   10   Sodium 135 - 145 mEq/L 138  136  143   Potassium 3.5 - 5.1 mEq/L 3.5  4.7  4.0   Chloride 96 - 112 mEq/L 103  101  103  CO2 19 - 32 mEq/L 27  24  24    Calcium 8.4 - 10.5 mg/dL 8.7  9.0  9.1        Component Value Date/Time   WBC 8.7 04/14/2022 0848   WBC 10.7 (H) 12/09/2021 0450   RBC 4.62 04/14/2022 0848   RBC 4.52 12/09/2021 0450   HGB 13.3 04/14/2022 0848   HCT 39.4 04/14/2022 0848   PLT 266 04/14/2022 0848   MCV 85 04/14/2022 0848   MCH 28.8 04/14/2022 0848   MCH 28.8 12/09/2021 0450   MCHC 33.8 04/14/2022 0848   MCHC 34.5 12/09/2021 0450   RDW 15.0 04/14/2022 0848   LYMPHSABS 2.0 12/09/2021 0450   MONOABS 0.8 12/09/2021 0450   EOSABS 0.2 12/09/2021 0450   BASOSABS 0.0 12/09/2021 0450     Parts of this  note may have been dictated using voice recognition software. There may be variances in spelling and vocabulary which are unintentional. Not all errors are proofread. Please notify the Thereasa Parkin if any discrepancies are noted or if the meaning of any statement is not clear.

## 2023-02-03 DIAGNOSIS — M25612 Stiffness of left shoulder, not elsewhere classified: Secondary | ICD-10-CM | POA: Diagnosis not present

## 2023-02-03 DIAGNOSIS — M6281 Muscle weakness (generalized): Secondary | ICD-10-CM | POA: Diagnosis not present

## 2023-02-03 DIAGNOSIS — S46012D Strain of muscle(s) and tendon(s) of the rotator cuff of left shoulder, subsequent encounter: Secondary | ICD-10-CM | POA: Diagnosis not present

## 2023-02-03 DIAGNOSIS — M7522 Bicipital tendinitis, left shoulder: Secondary | ICD-10-CM | POA: Diagnosis not present

## 2023-02-07 DIAGNOSIS — S46012D Strain of muscle(s) and tendon(s) of the rotator cuff of left shoulder, subsequent encounter: Secondary | ICD-10-CM | POA: Diagnosis not present

## 2023-02-07 DIAGNOSIS — M6281 Muscle weakness (generalized): Secondary | ICD-10-CM | POA: Diagnosis not present

## 2023-02-07 DIAGNOSIS — M25612 Stiffness of left shoulder, not elsewhere classified: Secondary | ICD-10-CM | POA: Diagnosis not present

## 2023-02-07 DIAGNOSIS — M7522 Bicipital tendinitis, left shoulder: Secondary | ICD-10-CM | POA: Diagnosis not present

## 2023-02-09 DIAGNOSIS — M6281 Muscle weakness (generalized): Secondary | ICD-10-CM | POA: Diagnosis not present

## 2023-02-09 DIAGNOSIS — M7522 Bicipital tendinitis, left shoulder: Secondary | ICD-10-CM | POA: Diagnosis not present

## 2023-02-09 DIAGNOSIS — S46012D Strain of muscle(s) and tendon(s) of the rotator cuff of left shoulder, subsequent encounter: Secondary | ICD-10-CM | POA: Diagnosis not present

## 2023-02-09 DIAGNOSIS — M25612 Stiffness of left shoulder, not elsewhere classified: Secondary | ICD-10-CM | POA: Diagnosis not present

## 2023-02-13 DIAGNOSIS — M25612 Stiffness of left shoulder, not elsewhere classified: Secondary | ICD-10-CM | POA: Diagnosis not present

## 2023-02-13 DIAGNOSIS — M7522 Bicipital tendinitis, left shoulder: Secondary | ICD-10-CM | POA: Diagnosis not present

## 2023-02-13 DIAGNOSIS — S46012D Strain of muscle(s) and tendon(s) of the rotator cuff of left shoulder, subsequent encounter: Secondary | ICD-10-CM | POA: Diagnosis not present

## 2023-02-13 DIAGNOSIS — M6281 Muscle weakness (generalized): Secondary | ICD-10-CM | POA: Diagnosis not present

## 2023-02-15 DIAGNOSIS — M25612 Stiffness of left shoulder, not elsewhere classified: Secondary | ICD-10-CM | POA: Diagnosis not present

## 2023-02-15 DIAGNOSIS — S46012D Strain of muscle(s) and tendon(s) of the rotator cuff of left shoulder, subsequent encounter: Secondary | ICD-10-CM | POA: Diagnosis not present

## 2023-02-15 DIAGNOSIS — M7522 Bicipital tendinitis, left shoulder: Secondary | ICD-10-CM | POA: Diagnosis not present

## 2023-02-15 DIAGNOSIS — M6281 Muscle weakness (generalized): Secondary | ICD-10-CM | POA: Diagnosis not present

## 2023-02-24 DIAGNOSIS — M7522 Bicipital tendinitis, left shoulder: Secondary | ICD-10-CM | POA: Diagnosis not present

## 2023-02-24 DIAGNOSIS — S46012D Strain of muscle(s) and tendon(s) of the rotator cuff of left shoulder, subsequent encounter: Secondary | ICD-10-CM | POA: Diagnosis not present

## 2023-02-24 DIAGNOSIS — M6281 Muscle weakness (generalized): Secondary | ICD-10-CM | POA: Diagnosis not present

## 2023-02-24 DIAGNOSIS — M25612 Stiffness of left shoulder, not elsewhere classified: Secondary | ICD-10-CM | POA: Diagnosis not present

## 2023-02-27 DIAGNOSIS — M6281 Muscle weakness (generalized): Secondary | ICD-10-CM | POA: Diagnosis not present

## 2023-02-27 DIAGNOSIS — M25612 Stiffness of left shoulder, not elsewhere classified: Secondary | ICD-10-CM | POA: Diagnosis not present

## 2023-02-27 DIAGNOSIS — S46012D Strain of muscle(s) and tendon(s) of the rotator cuff of left shoulder, subsequent encounter: Secondary | ICD-10-CM | POA: Diagnosis not present

## 2023-02-27 DIAGNOSIS — M7522 Bicipital tendinitis, left shoulder: Secondary | ICD-10-CM | POA: Diagnosis not present

## 2023-02-28 DIAGNOSIS — G4733 Obstructive sleep apnea (adult) (pediatric): Secondary | ICD-10-CM | POA: Diagnosis not present

## 2023-03-01 DIAGNOSIS — M25612 Stiffness of left shoulder, not elsewhere classified: Secondary | ICD-10-CM | POA: Diagnosis not present

## 2023-03-01 DIAGNOSIS — M6281 Muscle weakness (generalized): Secondary | ICD-10-CM | POA: Diagnosis not present

## 2023-03-01 DIAGNOSIS — S46012D Strain of muscle(s) and tendon(s) of the rotator cuff of left shoulder, subsequent encounter: Secondary | ICD-10-CM | POA: Diagnosis not present

## 2023-03-01 DIAGNOSIS — M7522 Bicipital tendinitis, left shoulder: Secondary | ICD-10-CM | POA: Diagnosis not present

## 2023-03-06 DIAGNOSIS — M25612 Stiffness of left shoulder, not elsewhere classified: Secondary | ICD-10-CM | POA: Diagnosis not present

## 2023-03-06 DIAGNOSIS — M6281 Muscle weakness (generalized): Secondary | ICD-10-CM | POA: Diagnosis not present

## 2023-03-06 DIAGNOSIS — S46012D Strain of muscle(s) and tendon(s) of the rotator cuff of left shoulder, subsequent encounter: Secondary | ICD-10-CM | POA: Diagnosis not present

## 2023-03-06 DIAGNOSIS — M7522 Bicipital tendinitis, left shoulder: Secondary | ICD-10-CM | POA: Diagnosis not present

## 2023-03-13 DIAGNOSIS — S46012D Strain of muscle(s) and tendon(s) of the rotator cuff of left shoulder, subsequent encounter: Secondary | ICD-10-CM | POA: Diagnosis not present

## 2023-03-13 DIAGNOSIS — M25612 Stiffness of left shoulder, not elsewhere classified: Secondary | ICD-10-CM | POA: Diagnosis not present

## 2023-03-13 DIAGNOSIS — M7522 Bicipital tendinitis, left shoulder: Secondary | ICD-10-CM | POA: Diagnosis not present

## 2023-03-13 DIAGNOSIS — M6281 Muscle weakness (generalized): Secondary | ICD-10-CM | POA: Diagnosis not present

## 2023-03-14 DIAGNOSIS — M7522 Bicipital tendinitis, left shoulder: Secondary | ICD-10-CM | POA: Diagnosis not present

## 2023-03-21 ENCOUNTER — Ambulatory Visit: Payer: BC Managed Care – PPO | Admitting: Medical

## 2023-03-22 DIAGNOSIS — S46012D Strain of muscle(s) and tendon(s) of the rotator cuff of left shoulder, subsequent encounter: Secondary | ICD-10-CM | POA: Diagnosis not present

## 2023-03-22 DIAGNOSIS — M7522 Bicipital tendinitis, left shoulder: Secondary | ICD-10-CM | POA: Diagnosis not present

## 2023-03-22 DIAGNOSIS — M6281 Muscle weakness (generalized): Secondary | ICD-10-CM | POA: Diagnosis not present

## 2023-03-22 DIAGNOSIS — M25612 Stiffness of left shoulder, not elsewhere classified: Secondary | ICD-10-CM | POA: Diagnosis not present

## 2023-03-29 DIAGNOSIS — M7522 Bicipital tendinitis, left shoulder: Secondary | ICD-10-CM | POA: Diagnosis not present

## 2023-03-29 DIAGNOSIS — S46012D Strain of muscle(s) and tendon(s) of the rotator cuff of left shoulder, subsequent encounter: Secondary | ICD-10-CM | POA: Diagnosis not present

## 2023-03-29 DIAGNOSIS — M6281 Muscle weakness (generalized): Secondary | ICD-10-CM | POA: Diagnosis not present

## 2023-04-05 NOTE — Telephone Encounter (Signed)
No action done

## 2023-04-07 ENCOUNTER — Other Ambulatory Visit: Payer: BC Managed Care – PPO

## 2023-04-12 ENCOUNTER — Ambulatory Visit: Payer: BC Managed Care – PPO | Admitting: Cardiology

## 2023-04-14 DIAGNOSIS — U071 COVID-19: Secondary | ICD-10-CM | POA: Diagnosis not present

## 2023-04-14 DIAGNOSIS — B349 Viral infection, unspecified: Secondary | ICD-10-CM | POA: Diagnosis not present

## 2023-04-19 ENCOUNTER — Ambulatory Visit: Payer: BC Managed Care – PPO

## 2023-04-19 ENCOUNTER — Ambulatory Visit: Payer: BC Managed Care – PPO | Admitting: Cardiology

## 2023-04-19 DIAGNOSIS — I25118 Atherosclerotic heart disease of native coronary artery with other forms of angina pectoris: Secondary | ICD-10-CM

## 2023-04-19 DIAGNOSIS — I428 Other cardiomyopathies: Secondary | ICD-10-CM | POA: Diagnosis not present

## 2023-05-01 DIAGNOSIS — G4733 Obstructive sleep apnea (adult) (pediatric): Secondary | ICD-10-CM | POA: Diagnosis not present

## 2023-05-02 ENCOUNTER — Encounter: Payer: Self-pay | Admitting: Cardiology

## 2023-05-02 ENCOUNTER — Ambulatory Visit: Payer: BC Managed Care – PPO | Admitting: Cardiology

## 2023-05-02 VITALS — BP 116/75 | HR 85 | Resp 16 | Ht 62.0 in | Wt 174.0 lb

## 2023-05-02 DIAGNOSIS — I25118 Atherosclerotic heart disease of native coronary artery with other forms of angina pectoris: Secondary | ICD-10-CM

## 2023-05-02 DIAGNOSIS — I428 Other cardiomyopathies: Secondary | ICD-10-CM | POA: Diagnosis not present

## 2023-05-02 DIAGNOSIS — I10 Essential (primary) hypertension: Secondary | ICD-10-CM

## 2023-05-02 DIAGNOSIS — E1122 Type 2 diabetes mellitus with diabetic chronic kidney disease: Secondary | ICD-10-CM

## 2023-05-02 DIAGNOSIS — E7800 Pure hypercholesterolemia, unspecified: Secondary | ICD-10-CM

## 2023-05-02 NOTE — Patient Instructions (Incomplete)

## 2023-05-02 NOTE — Progress Notes (Signed)
Primary Physician/Referring:  Tollie Eth, NP  Patient ID: Andrea Robertson, female    DOB: 1960-11-14, 62 y.o.   MRN: [id number]  Chief Complaint  Patient presents with   Cardiomyopathy   Coronary Artery Disease   Follow-up   HPI:    Dannita Larkey  is a 62 y.o. African-American female patient with history of NSTEMI and CAD, LAD stenting in 2020, diet-controlled diabetes mellitus, hypertension, hyperlipidemia, chronic stage IIIa kidney disease probably due to hypertension and diabetes mellitus, new onset of cardiomyopathy with severely reduced LVEF by echocardiogram in 2022 to 25 to 30%, cardiac catheterization on 07/27/2021 revealing widely patent LAD stent with minimal disease in the other vessels and is on therapy for nonischemic cardiomyopathy.  Patient is here on a 25-month office visit.  She has not had any further episodes of heart failure symptoms including shortness of breath, leg edema, PND or orthopnea.  No chest pain or palpitations or dizziness or syncope.  Tolerating all medications well.  She is being careful with her diet, has also increased physical activity.  Since also being on Ozempic, she has been gradually losing weight.  Past Medical History:  Diagnosis Date   AKI (acute kidney injury) (HCC) 03/09/2021   Anxiety    Arthritis    Chronic kidney disease    Coronary artery disease involving native coronary artery of native heart without angina pectoris 07/04/2019   COVID-19 04/19/2022   Depression    Diabetes mellitus without complication (HCC)    Encounter for annual physical exam 05/17/2022   Essential hypertension 10/30/2018   Essential hypertension 10/30/2018   Hyperlipidemia 07/04/2019   Ischemic cardiomyopathy    Myocardial infarction [facility])    Other allergy status, other than to drugs and biological substances 11/10/2020   Palpitations    Perforation of sigmoid colon due to diverticulitis 12/23/2020   Primary localized osteoarthritis of left knee     Primary localized osteoarthritis of left knee    Recurrent major depression in remission (HCC) 11/10/2020   S/P total knee arthroplasty, right    Shortness of breath 03/09/2021   TOA (tubo-ovarian abscess) 12/07/2021   Family History  Problem Relation Age of Onset   Heart disease Mother    Multiple myeloma Mother    Heart attack Father 36   CAD Father    Hypertension Sister    Hypertension Sister    Hypertension Sister    Heart failure Brother    Sleep apnea Neg Hx     Social History   Tobacco Use   Smoking status: Never   Smokeless tobacco: Never  Substance Use Topics   Alcohol use: Yes    Comment: occ   Marital Status: Married  ROS  Review of Systems  Cardiovascular:  Negative for chest pain, dyspnea on exertion and leg swelling.   Objective  Blood pressure 116/75, pulse 85, resp. rate 16, height 5\' 2"  (1.575 m), weight 174 lb (78.9 kg), SpO2 96%. Body mass index is 31.83 kg/m.     05/02/2023    2:24 PM 02/01/2023    8:35 AM 01/05/2023   11:39 AM  Vitals with BMI  Height 5\' 2"   5\' 2"   Weight 174 lbs 178 lbs 10 oz 182 lbs 3 oz  BMI 31.82  33.32  Systolic 116 120 409  Diastolic 75 70 68  Pulse 85 69 72     Physical Exam Constitutional:      Appearance: She is obese.  Neck:  Vascular: No carotid bruit or JVD.  Cardiovascular:     Rate and Rhythm: Normal rate and regular rhythm.     Pulses: Intact distal pulses.     Heart sounds: Normal heart sounds. No murmur heard.    No gallop.  Pulmonary:     Effort: Pulmonary effort is normal.     Breath sounds: Normal breath sounds.  Abdominal:     General: Bowel sounds are normal.     Palpations: Abdomen is soft.  Musculoskeletal:     Right lower leg: No edema.     Left lower leg: No edema.     Laboratory examination:   Recent Labs    10/04/22 1019 12/06/22 0933 01/30/23 1449  NA 143 136 138  K 4.0 4.7 3.5  CL 103 101 103  CO2 24 24 27   GLUCOSE 104* 108* 103*  BUN 14 16 19   CREATININE 1.37*  1.33* 1.51*  CALCIUM 9.1 9.0 8.7  GFRNONAA  --  46*  --       Latest Ref Rng & Units 01/30/2023    2:49 PM 12/06/2022    9:33 AM 10/04/2022   10:19 AM  CMP  Glucose 70 - 99 mg/dL 409  811  914   BUN 6 - 23 mg/dL 19  16  14    Creatinine 0.40 - 1.20 mg/dL 7.82  9.56  2.13   Sodium 135 - 145 mEq/L 138  136  143   Potassium 3.5 - 5.1 mEq/L 3.5  4.7  4.0   Chloride 96 - 112 mEq/L 103  101  103   CO2 19 - 32 mEq/L 27  24  24    Calcium 8.4 - 10.5 mg/dL 8.7  9.0  9.1   Total Protein 6.0 - 8.3 g/dL 6.9     Total Bilirubin 0.2 - 1.2 mg/dL 0.6     Alkaline Phos 39 - 117 U/L 91     AST 0 - 37 U/L 32     ALT 0 - 35 U/L 41         Latest Ref Rng & Units 04/14/2022    8:48 AM 12/09/2021    4:50 AM 12/08/2021   11:06 AM  CBC  WBC 3.4 - 10.8 x10E3/uL 8.7  10.7  10.9   Hemoglobin 11.1 - 15.9 g/dL 08.6  57.8  46.9   Hematocrit 34.0 - 46.6 % 39.4  37.7  38.7   Platelets 150 - 450 x10E3/uL 266  269  255    Lab Results  Component Value Date   CHOL 129 01/30/2023   HDL 50.10 01/30/2023   LDLCALC 67 01/30/2023   TRIG 60.0 01/30/2023   CHOLHDL 3 01/30/2023    HEMOGLOBIN A1C Lab Results  Component Value Date   HGBA1C 5.9 01/30/2023   MPG 148.46 11/11/2020   Lab Results  Component Value Date   TSH 1.360 07/25/2019    Allergies   Allergies  Allergen Reactions   Dilaudid [Hydromorphone Hcl] Hives    Can tolerate if premedicated with benadryl   Erythromycin Hives and Nausea And Vomiting   Percocet [Oxycodone-Acetaminophen] Itching and Nausea And Vomiting    Can tolerate with premedication of diphenhydramine and/or nausea medication   Zithromax [Azithromycin] Nausea And Vomiting   Penicillins Hives, Nausea And Vomiting and Rash   Codeine Nausea Only   Oxycodone Other (See Comments)    Other reaction(s): nausea, epigastric pain   Cefprozil Rash    rash but takes Ceftin   Other Rash and  Other (See Comments)    Gel on Ekg pads      Medication list after today's encounter     Current Outpatient Medications:    amLODipine (NORVASC) 10 MG tablet, TAKE 1 TABLET BY MOUTH EVERY DAY, Disp: 90 tablet, Rfl: 3   aspirin EC 81 MG EC tablet, Take 1 tablet (81 mg total) by mouth daily. (Patient taking differently: Take 81 mg by mouth every morning.), Disp: 90 tablet, Rfl: 3   atorvastatin (LIPITOR) 80 MG tablet, Take 1 tablet by mouth daily., Disp: , Rfl:    buPROPion (WELLBUTRIN XL) 300 MG 24 hr tablet, Take 1 tablet (300 mg total) by mouth daily., Disp: 90 tablet, Rfl: 3   carvedilol (COREG) 25 MG tablet, TAKE 1&1/2 TABLETS BY MOUTH TWICE A DAY, Disp: 270 tablet, Rfl: 3   dapagliflozin propanediol (FARXIGA) 10 MG TABS tablet, Take 1 tablet (10 mg total) by mouth daily before breakfast., Disp: 90 tablet, Rfl: 3   escitalopram (LEXAPRO) 20 MG tablet, Take 1 tablet (20 mg total) by mouth every morning., Disp: 90 tablet, Rfl: 3   ezetimibe (ZETIA) 10 MG tablet, TAKE 1 TABLET BY MOUTH EVERY DAY, Disp: 90 tablet, Rfl: 3   fexofenadine (ALLEGRA) 180 MG tablet, Take 180 mg by mouth as needed., Disp: , Rfl:    fluticasone (FLONASE) 50 MCG/ACT nasal spray, Place 2 sprays into both nostrils as needed., Disp: , Rfl:    Meclizine HCl 25 MG CHEW, Chew by mouth as needed., Disp: , Rfl:    Multiple Vitamin (MULTIVITAMIN WITH MINERALS) TABS tablet, Take 1 tablet by mouth every morning., Disp: , Rfl:    nitroGLYCERIN (NITROSTAT) 0.4 MG SL tablet, Place 1 tablet (0.4 mg total) under the tongue every 5 (five) minutes x 3 doses as needed for chest pain., Disp: 25 tablet, Rfl: 1   sacubitril-valsartan (ENTRESTO) 49-51 MG, TAKE 1 TABLET BY MOUTH TWICE A DAY, Disp: 180 tablet, Rfl: 2   Semaglutide, 2 MG/DOSE, 8 MG/3ML SOPN, Inject 2 mg as directed once a week., Disp: 9 mL, Rfl: 3  Radiology:   No results found.  Cardiac Studies:   PCV MYOCARDIAL PERFUSION WO LEXISCAN 06/21/2021 Lexiscan/modified Bruce nuclear stress test performed using 1-day protocol. Patient reached 7 METS, 64% MPHR. In  addition, stress EKG showed sinus tachycardia, inferolateral infarct, no acute ischemic changes. SPECT Images showed medium sized, severe intensity, predominantly fixed perfusion defect with minimal peri-infarct ischemia in apical to basal, inferior/inferoseptal myocardium, associated with absent myocardial thickening. Stress LVEF 35%. Compared to the study done on 06/2020, no significant change.  Previously noted inferior wall scar is unchanged. Anterior and anterolateral scar with minimal reversible defect not noted in the present study. EF was previously calculated at 44%.  High risk study.   Left and right heart Catheterization 07/27/21:  RA: 11/10, mean 8, RA saturation 65% RV: 28/-3, EDP 4 mmHg PA 28/3, mean 16 mmHg.  PA saturation 67%. PW 8/8, mean 5 mmHg.  Aortic saturation 90% QP/QS 1.01, left >right shunt 0.48, 8%, not significant. CO 5.5, CI 2.99, normal. LV: 107/0, EDP 12 mmHg.  Ao 108/60, mean 82 mmHg.  No pressure gradient across the aortic valve.  LV: Distal anterior, apical and inferoapical aneurysm formation.  LVEF 40%.  No significant MR. RCA: Minimal disease, dominant. LM: Large vessel.  Smooth and normal. LAD: Large vessel.  It gives origin to a moderate-sized D1 and D2, D1 has ostial 50 to 60% stenosis.  Mid LAD stent 2.5 x 20 mm Synergy  placed 07/03/2019 for NSTEMI widely patent. LCx: Large vessel, smooth and normal.  Impression: Patient has ischemic cardiomyopathy with apical dyskinesis.  Preserved cardiac output and cardiac index.  There is insignificant right to left shunting.  90 mL contrast utilized. Patient needs continued aggressive risk modification and medical therapy.     PCV ECHOCARDIOGRAM COMPLETE 04/19/2023  Narrative Echocardiogram 04/19/2023: Left ventricle cavity is normal in size. Mild concentric hypertrophy of the left ventricle. Mid to apical anteroseptal, apical inferolateral, apical hypokinesis. LVEF 40-45%. Doppler evidence of grade I (impaired)  diastolic dysfunction, normal LAP. Trileaflet aortic valve. Mild (Grade I) aortic regurgitation. Mild (Grade I) mitral regurgitation. Mild tricuspid regurgitation. No evidence of pulmonary hypertension. No significant change compared to previous study on 06/30/2022.     EKG:   EKG 05/02/2023: Normal sinus rhythm at rate of 78 bpm, poor R progression, cannot exclude anterolateral infarct old.  LVH.  Nonspecific T abnormality.  Compared to 10/06/2022, no significant change.  Assessment     ICD-10-CM   1. Coronary artery disease of native artery of native heart with stable angina pectoris (HCC)  I25.118 EKG 12-Lead    2. Non-ischemic cardiomyopathy (HCC)  I42.8     3. Primary hypertension  I10     4. Pure hypercholesterolemia  E78.00     5. Type 2 diabetes mellitus with stage 3a chronic kidney disease, without long-term current use of insulin (HCC)  E11.22    N18.31        Medications Discontinued During This Encounter  Medication Reason   diphenhydrAMINE (BENADRYL ALLERGY) 25 mg capsule    docusate sodium (COLACE) 100 MG capsule    methocarbamol (ROBAXIN) 500 MG tablet       No orders of the defined types were placed in this encounter.   Orders Placed This Encounter  Procedures   EKG 12-Lead   Recommendations:   Eldona Adrien is a 62 y.o. African-American female patient with history of NSTEMI and CAD, LAD stenting in 2020, diet-controlled diabetes mellitus, hypertension, hyperlipidemia, chronic stage IIIa kidney disease probably due to hypertension and diabetes mellitus, new onset of cardiomyopathy with severely reduced LVEF by echocardiogram in 2022 to 25 to 30%, cardiac catheterization on 07/27/2021 revealing widely patent LAD stent with minimal disease in the other vessels and is on therapy for nonischemic cardiomyopathy.  1. Coronary artery disease of native artery of native heart with stable angina pectoris [facility]) Patient fortunately has not had any recurrence of  angina pectoris, she is on appropriate medical therapy, presently on aspirin 81 mg daily along with beta-blocker therapy and also on Entresto. - EKG 12-Lead  2. Non-ischemic cardiomyopathy [facility]) Patient has nonischemic cardiomyopathy, unrelated to LAD stenosis, now she is on guideline directed medical therapy.  She is tolerating all medications well, she is on moderate dose Entresto in view of chronic stage IIIa kidney disease.  She is also on maximum dose of carvedilol at 37.5 mg twice daily.  3. Primary hypertension Blood pressure is well-controlled.  Renal function has remained stable.  4. Pure hypercholesterolemia Lipids under excellent control, presently on atorvastatin 80 mg daily.  5. Type 2 diabetes mellitus with stage 3a chronic kidney disease, without long-term current use of insulin (HCC) I reviewed her external labs, diabetes is also well-controlled and renal function has remained stable.  Overall I am very pleased with her progress, LVEF has remained stable over the past greater than 1 year, I will see her back on annual basis.  I given her positive reinforcement  regarding continued weight loss.  She is doing her best to take good care of herself and has made positive changes to her lifestyle.   Yates Decamp, MD, [facility] 05/02/2023, 3:15 PM Office: [phone] Fax: [phone] Pager: [phone]

## 2023-05-02 NOTE — Progress Notes (Unsigned)
PATIENT: Andrea Robertson DOB: Jan 18, 1961  REASON FOR VISIT: follow up HISTORY FROM: patient  No chief complaint on file.    HISTORY OF PRESENT ILLNESS:  05/02/23 ALL:  [name] is a 62 y.o. female here today for follow up for OSA on CPAP. She was seen in consult with Dr Frances Furbish 09/2022 for concerns of snoring, witnessed apneic events and excessive daytime sleepiness. PSG showed "mild obstructive sleep apnea, more pronounced during supine sleep with a total AHI of 10/h, REM AHI of 7.2/h, supine AHI of 96.9/h, O2 nadir 87%." AutoPAP advised. Since,     HISTORY: (copied from Dr Teofilo Pod previous note)  Dear Huntley Dec,   I saw your patient, Andrea Robertson, upon your kind request in my sleep clinic today for initial consultation of her sleep disorder, in particular, concern for underlying obstructive sleep apnea.  The patient is unaccompanied today.  As you know, Ms. Feo is a 62 year old female with an underlying complex medical history of diabetes, hypertension, chronic kidney disease, hyperlipidemia, nonischemic cardiomyopathy, coronary artery disease with history of MI, status post stent placement, sickle cell trait, depression, anxiety, reflux disease, allergies, palpitations, and obesity, who reports snoring and excessive daytime somnolence as well as witnessed apneas per husband's report.  Her Epworth sleepiness score is 4 out of 24, fatigue severity score is 14 out of 63.  I reviewed your office note from 08/30/2022.  She had a sleep study several years ago, approximately 15 or 20 years ago which was negative for sleep apnea at the time.  Prior sleep study results are not available for my review today.  She lives with her husband, she has 2 grown children.  She has a dog in the household but the dog is primarily outside.  Bedtime is generally between 8:30 PM and 9 PM but lately it has been closer to 11 or 11:30 PM as she is in school for her business management degree.  She works  full-time as an Biomedical engineer.  Rise time is generally around 6:30 AM to 7 AM.  She has no recurrent morning headaches, no night to night nocturia.  Her sister has sleep apnea and has a CPAP machine.  Patient cut back on caffeine intake and limits her caffeine to 1 cup of coffee per day generally.  She drinks alcohol rarely, she is a non-smoker.  She does have a TV in her bedroom but it is typically not on at night.  She is a side sleeper and stomach sleeper, cannot sleep on her back especially flat on her back.   REVIEW OF SYSTEMS: Out of a complete 14 system review of symptoms, the patient complains only of the following symptoms, and all other reviewed systems are negative.  ESS: previously 4/24  ALLERGIES: Allergies  Allergen Reactions   Dilaudid [Hydromorphone Hcl] Hives    Can tolerate if premedicated with benadryl   Erythromycin Hives and Nausea And Vomiting   Percocet [Oxycodone-Acetaminophen] Itching and Nausea And Vomiting    Can tolerate with premedication of diphenhydramine and/or nausea medication   Zithromax [Azithromycin] Nausea And Vomiting   Penicillins Hives, Nausea And Vomiting and Rash   Codeine Nausea Only   Oxycodone Other (See Comments)    Other reaction(s): nausea, epigastric pain   Cefprozil Rash    rash but takes Ceftin   Other Rash and Other (See Comments)    Gel on Ekg pads    HOME MEDICATIONS: Outpatient Medications Prior to Visit  Medication Sig Dispense Refill  amLODipine (NORVASC) 10 MG tablet TAKE 1 TABLET BY MOUTH EVERY DAY 90 tablet 3   aspirin EC 81 MG EC tablet Take 1 tablet (81 mg total) by mouth daily. (Patient taking differently: Take 81 mg by mouth every morning.) 90 tablet 3   atorvastatin (LIPITOR) 80 MG tablet Take 1 tablet by mouth daily.     buPROPion (WELLBUTRIN XL) 300 MG 24 hr tablet Take 1 tablet (300 mg total) by mouth daily. 90 tablet 3   carvedilol (COREG) 25 MG tablet TAKE 1&1/2 TABLETS BY MOUTH TWICE A DAY 270 tablet 3    dapagliflozin propanediol (FARXIGA) 10 MG TABS tablet Take 1 tablet (10 mg total) by mouth daily before breakfast. 90 tablet 3   diphenhydrAMINE (BENADRYL ALLERGY) 25 mg capsule Take 1 capsule (25 mg total) by mouth every 6 (six) hours as needed for itching. 30 capsule 0   docusate sodium (COLACE) 100 MG capsule Take 100 mg by mouth 2 (two) times daily.     escitalopram (LEXAPRO) 20 MG tablet Take 1 tablet (20 mg total) by mouth every morning. 90 tablet 3   ezetimibe (ZETIA) 10 MG tablet TAKE 1 TABLET BY MOUTH EVERY DAY 90 tablet 3   methocarbamol (ROBAXIN) 500 MG tablet Take 1-2 tablets (500-1,000 mg total) by mouth every 8 (eight) hours as needed for muscle spasms. (Patient not taking: Reported on 02/01/2023) 30 tablet 0   Multiple Vitamin (MULTIVITAMIN WITH MINERALS) TABS tablet Take 1 tablet by mouth every morning.     nitroGLYCERIN (NITROSTAT) 0.4 MG SL tablet Place 1 tablet (0.4 mg total) under the tongue every 5 (five) minutes x 3 doses as needed for chest pain. 25 tablet 1   sacubitril-valsartan (ENTRESTO) 49-51 MG TAKE 1 TABLET BY MOUTH TWICE A DAY 180 tablet 2   Semaglutide, 2 MG/DOSE, 8 MG/3ML SOPN Inject 2 mg as directed once a week. 9 mL 3   No facility-administered medications prior to visit.    PAST MEDICAL HISTORY: Past Medical History:  Diagnosis Date   AKI (acute kidney injury) (HCC) 03/09/2021   Anxiety    Arthritis    Chronic kidney disease    Coronary artery disease involving native coronary artery of native heart without angina pectoris 07/04/2019   COVID-19 04/19/2022   Depression    Diabetes mellitus without complication (HCC)    Encounter for annual physical exam 05/17/2022   Essential hypertension 10/30/2018   Essential hypertension 10/30/2018   Hyperlipidemia 07/04/2019   Ischemic cardiomyopathy    Myocardial infarction [facility])    Other allergy status, other than to drugs and biological substances 11/10/2020   Palpitations    Perforation of sigmoid colon due  to diverticulitis 12/23/2020   Primary localized osteoarthritis of left knee    Primary localized osteoarthritis of left knee    Recurrent major depression in remission (HCC) 11/10/2020   S/P total knee arthroplasty, right    Shortness of breath 03/09/2021   TOA (tubo-ovarian abscess) 12/07/2021    PAST SURGICAL HISTORY: Past Surgical History:  Procedure Laterality Date   ABDOMINAL HYSTERECTOMY     CHOLECYSTECTOMY     CORONARY STENT INTERVENTION N/A 07/03/2019   Procedure: CORONARY STENT INTERVENTION;  Surgeon: Tonny Bollman, MD;  [facility] INVAS[facility] CV LAB;  Service: Cardiovascular;  Laterality: N/A;   LEFT HEART CATH AND CORONARY ANGIOGRAPHY N/A 07/03/2019   Procedure: LEFT HEART CATH AND CORONARY ANGIOGRAPHY;  Surgeon: Tonny Bollman, MD;  Location: [facility] INVASIVE CV LAB;  Service: Cardiovascular;  Laterality: N/A;  RIGHT/LEFT HEART CATH AND CORONARY ANGIOGRAPHY N/A 07/27/2021   Procedure: RIGHT/LEFT HEART CATH AND CORONARY ANGIOGRAPHY;  Surgeon: Yates Decamp, MD;  Location: [facility] INVASIVE CV LAB;  Service: Cardiovascular;  Laterality: N/A;   SHOULDER ARTHROSCOPY WITH SUBACROMIAL DECOMPRESSION, ROTATOR CUFF REPAIR AND BICEP TENDON REPAIR Left 12/08/2022   Procedure: SHOULDER ARTHROSCOPY WITH SUBACROMIAL DECOMPRESSION, ROTATOR CUFF REPAIR AND BICEP TENDON REPAIR;  Surgeon: Bjorn Pippin, MD;  Location: [facility];  Service: Orthopedics;  Laterality: Left;   TOTAL KNEE ARTHROPLASTY Right 10/13/2015   Procedure: RIGHT TOTAL KNEE ARTHROPLASTY;  Surgeon: Ranee Gosselin, MD;  Location: [facility];  Service: Orthopedics;  Laterality: Right;   TOTAL KNEE ARTHROPLASTY Left 11/23/2020   Procedure: TOTAL KNEE ARTHROPLASTY;  Surgeon: Salvatore Marvel, MD;  Location: [facility];  Service: Orthopedics;  Laterality: Left;    FAMILY HISTORY: Family History  Problem Relation Age of Onset   Heart disease Mother    Multiple myeloma Mother    Heart attack Father 65   CAD Father    Hypertension Sister     Hypertension Sister    Hypertension Sister    Heart failure Brother    Sleep apnea Neg Hx     SOCIAL HISTORY: Social History   Socioeconomic History   Marital status: Married    Spouse name: Not on file   Number of children: 2   Years of education: Not on file   Highest education level: Not on file  Occupational History   Not on file  Tobacco Use   Smoking status: Never   Smokeless tobacco: Never  Vaping Use   Vaping status: Never Used  Substance and Sexual Activity   Alcohol use: Yes    Comment: occ   Drug use: No   Sexual activity: Yes    Birth control/protection: Surgical    Comment: Hyst  Other Topics Concern   Not on file  Social History Narrative   Not on file   Social Determinants of Health   Financial Resource Strain: Not on file  Food Insecurity: No Food Insecurity (10/08/2019)   Hunger Vital Sign    Worried About Running Out of Food in the Last Year: Never true    Ran Out of Food in the Last Year: Never true  Transportation Needs: No Transportation Needs (10/08/2019)   PRAPARE - Administrator, Civil Service (Medical): No    Lack of Transportation (Non-Medical): No  Physical Activity: Not on file  Stress: No Stress Concern Present (10/08/2019)   [employer] of Occupational Health - Occupational Stress Questionnaire    Feeling of Stress : Only a little  Social Connections: Socially Integrated (10/08/2019)   Social Connection and Isolation Panel [NHANES]    Frequency of Communication with Friends and Family: More than three times a week    Frequency of Social Gatherings with Friends and Family: Three times a week    Attends Religious Services: More than 4 times per year    Active Member of Clubs or Organizations: No    Attends Engineer, structural: More than 4 times per year    Marital Status: Married  Catering manager Violence: Not on file     PHYSICAL EXAM  There were no vitals filed for this visit. There is no height  or weight on file to calculate BMI.  Generalized: Well developed, in no acute distress  Cardiology: normal rate and rhythm, no murmur noted Respiratory: clear to auscultation bilaterally  Neurological examination  Mentation: Alert  oriented to time, place, history taking. Follows all commands speech and language fluent Cranial nerve II-XII: Pupils were equal round reactive to light. Extraocular movements were full, visual field were full  Motor: The motor testing reveals 5 over 5 strength of all 4 extremities. Good symmetric motor tone is noted throughout.  Gait and station: Gait is normal.    DIAGNOSTIC DATA (LABS, IMAGING, TESTING) - I reviewed patient records, labs, notes, testing and imaging myself where available.      No data to display           Lab Results  Component Value Date   WBC 8.7 04/14/2022   HGB 13.3 04/14/2022   HCT 39.4 04/14/2022   MCV 85 04/14/2022   PLT 266 04/14/2022      Component Value Date/Time   NA 138 01/30/2023 1449   NA 143 10/04/2022 1019   K 3.5 01/30/2023 1449   CL 103 01/30/2023 1449   CO2 27 01/30/2023 1449   GLUCOSE 103 (H) 01/30/2023 1449   BUN 19 01/30/2023 1449   BUN 14 10/04/2022 1019   CREATININE 1.51 (H) 01/30/2023 1449   CALCIUM 8.7 01/30/2023 1449   PROT 6.9 01/30/2023 1449   ALBUMIN 4.0 01/30/2023 1449   AST 32 01/30/2023 1449   ALT 41 (H) 01/30/2023 1449   ALKPHOS 91 01/30/2023 1449   BILITOT 0.6 01/30/2023 1449   GFRNONAA 46 (L) 12/06/2022 0933   GFRAA 39 (L) 08/28/2019 1021   Lab Results  Component Value Date   CHOL 129 01/30/2023   HDL 50.10 01/30/2023   LDLCALC 67 01/30/2023   TRIG 60.0 01/30/2023   CHOLHDL 3 01/30/2023   Lab Results  Component Value Date   HGBA1C 5.9 01/30/2023   No results found for: "VITAMINB12" Lab Results  Component Value Date   TSH 1.360 07/25/2019     ASSESSMENT AND PLAN 62 y.o. year old female  has a past medical history of AKI (acute kidney injury) (HCC) (03/09/2021),  Anxiety, Arthritis, Chronic kidney disease, Coronary artery disease involving native coronary artery of native heart without angina pectoris (07/04/2019), COVID-19 (04/19/2022), Depression, Diabetes mellitus without complication (HCC), Encounter for annual physical exam (05/17/2022), Essential hypertension (10/30/2018), Essential hypertension (10/30/2018), Hyperlipidemia (07/04/2019), Ischemic cardiomyopathy, Myocardial infarction [facility]), Other allergy status, other than to drugs and biological substances (11/10/2020), Palpitations, Perforation of sigmoid colon due to diverticulitis (12/23/2020), Primary localized osteoarthritis of left knee, Primary localized osteoarthritis of left knee, Recurrent major depression in remission (HCC) (11/10/2020), S/P total knee arthroplasty, right, Shortness of breath (03/09/2021), and TOA (tubo-ovarian abscess) (12/07/2021). here with   No diagnosis found.    [name] is doing well on CPAP therapy. Compliance report reveals ***. *** was encouraged to continue using CPAP nightly and for greater than 4 hours each night. We will update supply orders as indicated. Risks of untreated sleep apnea review and education materials provided. Healthy lifestyle habits encouraged. *** will follow up in ***, sooner if needed. *** verbalizes understanding and agreement with this plan.    No orders of the defined types were placed in this encounter.    No orders of the defined types were placed in this encounter.     Shawnie Dapper, FNP-C 05/02/2023, 8:06 AM [facility] [address] [place], [place] [address] [phone]

## 2023-05-03 ENCOUNTER — Encounter: Payer: Self-pay | Admitting: Family Medicine

## 2023-05-03 ENCOUNTER — Ambulatory Visit: Payer: BC Managed Care – PPO | Admitting: Family Medicine

## 2023-05-03 VITALS — BP 111/62 | HR 73 | Ht 62.5 in | Wt 173.4 lb

## 2023-05-03 DIAGNOSIS — G4733 Obstructive sleep apnea (adult) (pediatric): Secondary | ICD-10-CM

## 2023-05-17 ENCOUNTER — Encounter: Payer: BC Managed Care – PPO | Admitting: Nurse Practitioner

## 2023-06-16 ENCOUNTER — Ambulatory Visit: Payer: BC Managed Care – PPO | Admitting: Nurse Practitioner

## 2023-06-16 ENCOUNTER — Encounter: Payer: Self-pay | Admitting: Nurse Practitioner

## 2023-06-16 VITALS — BP 118/72 | HR 80 | Ht 63.25 in | Wt 170.6 lb

## 2023-06-16 DIAGNOSIS — I25118 Atherosclerotic heart disease of native coronary artery with other forms of angina pectoris: Secondary | ICD-10-CM

## 2023-06-16 DIAGNOSIS — N1831 Chronic kidney disease, stage 3a: Secondary | ICD-10-CM | POA: Diagnosis not present

## 2023-06-16 DIAGNOSIS — E785 Hyperlipidemia, unspecified: Secondary | ICD-10-CM

## 2023-06-16 DIAGNOSIS — Z0000 Encounter for general adult medical examination without abnormal findings: Secondary | ICD-10-CM | POA: Diagnosis not present

## 2023-06-16 DIAGNOSIS — I5042 Chronic combined systolic (congestive) and diastolic (congestive) heart failure: Secondary | ICD-10-CM

## 2023-06-16 DIAGNOSIS — F329 Major depressive disorder, single episode, unspecified: Secondary | ICD-10-CM

## 2023-06-16 DIAGNOSIS — E1121 Type 2 diabetes mellitus with diabetic nephropathy: Secondary | ICD-10-CM

## 2023-06-16 DIAGNOSIS — N1832 Chronic kidney disease, stage 3b: Secondary | ICD-10-CM | POA: Insufficient documentation

## 2023-06-16 DIAGNOSIS — E1169 Type 2 diabetes mellitus with other specified complication: Secondary | ICD-10-CM

## 2023-06-16 DIAGNOSIS — D573 Sickle-cell trait: Secondary | ICD-10-CM

## 2023-06-16 DIAGNOSIS — N183 Chronic kidney disease, stage 3 unspecified: Secondary | ICD-10-CM

## 2023-06-16 DIAGNOSIS — Z23 Encounter for immunization: Secondary | ICD-10-CM

## 2023-06-16 DIAGNOSIS — I129 Hypertensive chronic kidney disease with stage 1 through stage 4 chronic kidney disease, or unspecified chronic kidney disease: Secondary | ICD-10-CM | POA: Diagnosis not present

## 2023-06-16 DIAGNOSIS — E1122 Type 2 diabetes mellitus with diabetic chronic kidney disease: Secondary | ICD-10-CM | POA: Diagnosis not present

## 2023-06-16 DIAGNOSIS — I428 Other cardiomyopathies: Secondary | ICD-10-CM

## 2023-06-16 MED ORDER — BUPROPION HCL ER (XL) 300 MG PO TB24: 300.0000 mg | ORAL_TABLET | Freq: Every day | ORAL | 3 refills | Status: AC

## 2023-06-16 MED ORDER — DAPAGLIFLOZIN PROPANEDIOL 10 MG PO TABS: 10.0000 mg | ORAL_TABLET | Freq: Every day | ORAL | 3 refills | Status: DC

## 2023-06-16 MED ORDER — EZETIMIBE 10 MG PO TABS: 10.0000 mg | ORAL_TABLET | Freq: Every day | ORAL | 3 refills | Status: AC

## 2023-06-16 MED ORDER — ESCITALOPRAM OXALATE 20 MG PO TABS: 20.0000 mg | ORAL_TABLET | Freq: Every morning | ORAL | 3 refills | Status: AC

## 2023-06-16 MED ORDER — ATORVASTATIN CALCIUM 80 MG PO TABS: 80.0000 mg | ORAL_TABLET | Freq: Every day | ORAL | 3 refills | Status: AC

## 2023-06-16 MED ORDER — AMLODIPINE BESYLATE 10 MG PO TABS: 10.0000 mg | ORAL_TABLET | Freq: Every day | ORAL | 3 refills | Status: AC

## 2023-06-16 NOTE — Assessment & Plan Note (Signed)
Chronic. Currently followed with cardiology. Doing well enough that visits are annually now. No symptoms or concerns. No angina reported. Refills for all medications due sent today. Labs pending.

## 2023-06-16 NOTE — Assessment & Plan Note (Signed)
Chronic. Blood pressures are perfect today! She has had successful weight loss efforts and positive life changes for her overall health. I am so proud of her. Medication refills sent in today. Labs pending.

## 2023-06-16 NOTE — Assessment & Plan Note (Signed)
No alarm symptoms present at this time. Continue to monitor.

## 2023-06-16 NOTE — Assessment & Plan Note (Signed)

## 2023-06-16 NOTE — Progress Notes (Signed)
Shawna Clamp, DNP, AGNP-c [facility] [address] [place], [place] [address] Main Office [phone]  BP 118/72   Pulse 80   Ht 5' 3.25" (1.607 m)   Wt 170 lb 9.6 oz (77.4 kg)   BMI 29.98 kg/m    Subjective:    Patient ID: Andrea Robertson, female    DOB: 25-Nov-1960, 62 y.o.   MRN: [id number]  HPI: Andrea Robertson is a 62 y.o. female presenting on 06/16/2023 for comprehensive medical examination.   History of Present Illness Andrea Robertson, presents with a history of sleep apnea, diabetes, CKD, weight issues, and heart conditions, presents for her annual exam.    She reports recent issues with her CPAP machine, which led to discontinuation of use due to insurance coverage issues. The patient has decided to focus on weight loss as a means to manage her sleep apnea, which was not described as severe. Her last sleep study was earlier this year.   She reports a recent COVID-19 infection, contracted during a trip to [place]. The patient experienced lethargy and excessive sleep for about three days but did not report any severe symptoms.  The patient has been successful in her weight loss journey, losing 20 pounds since February and 15 pounds since April. However, she has been stuck at a weight of around 170 pounds for about a month. She admits to occasional consumption of soda, which she recognizes as a hindrance to her weight loss progress. She plans to get more aggressive with her efforts.   Despite her weight issues, the patient reports feeling good overall and has been able to maintain an active lifestyle. She recently experienced a moment of increased physical activity when she had to run after a runaway shopping cart, which she was able to do without issue, which was a very positive and reassuring experience for her.   The patient's heart conditions are reportedly well-managed, with her cardiologist stating she does not need to be seen for another year. She is  currently on amlodipine and Zetia, which are due to expire soon. She also takes atorvastatin, Entresto, and carvedilol.  The patient has been experiencing itching in her ears, which she attributes to allergies. She usually takes Allegra for this but has not started taking it yet this season.  The patient has been using a Dexcom monitor for her blood sugar levels but has decided to stop due to frequent malfunctions and the high cost of the device. She reports that her blood sugars have been pretty good overall.  Pertinent items are noted in HPI.  IMMUNIZATIONS:   Flu Vaccine: Flu vaccine given today Prevnar 13: Prevnar 13 N/A for this patient Prevnar 20: Prevnar 20 N/A for this patient Pneumovax 23: Pneumovax 23 N/A for this patient Vac Shingrix: Shingrix declined, patient will complete at a later date HPV: N/A or Aged Out Tetanus: Tetanus completed in the last 10 years COVID: Due, completed today RSV: No  HEALTH MAINTENANCE: Pap Smear HM Status: is up to date Mammogram HM Status: is up to date Colon Cancer Screening HM Status: is up to date Bone Density HM Status: N/A STI Testing HM Status: was declined  Lung CT HM Status: N/A  Concerns with vision, hearing, or dentition: No  Most Recent Depression Screen:     06/16/2023    9:27 AM 10/25/2022    8:33 AM 06/23/2022    8:18 AM 03/04/2022   10:01 AM 03/04/2022   10:00 AM  Depression screen PHQ 2/9  Decreased Interest 0 1 1 2  0  Down, Depressed, Hopeless 0 0 1 2 0  PHQ - 2 Score 0 1 2 4  0  Altered sleeping   1  0  Tired, decreased energy   1 0 0  Change in appetite   0 1 0  Feeling bad or failure about yourself    0 2 0  Trouble concentrating   1 2 0  Moving slowly or fidgety/restless   0 0 0  Suicidal thoughts   0 0 0  PHQ-9 Score   5  0  Difficult doing work/chores   Not difficult at all Somewhat difficult Not difficult at all   Most Recent Anxiety Screen:     06/23/2022    8:18 AM  GAD 7 : Generalized Anxiety Score   Nervous, Anxious, on Edge 1  Control/stop worrying 1  Worry too much - different things 1  Trouble relaxing 1  Restless 1  Easily annoyed or irritable 0  Afraid - awful might happen 0  Total GAD 7 Score 5  Anxiety Difficulty Not difficult at all   Most Recent Fall Screen:    06/16/2023    9:27 AM 06/23/2022    8:17 AM 03/04/2022   10:02 AM 10/08/2019    9:49 AM  Fall Risk   Falls in the past year? 0 0 0 0  Number falls in past yr: 0 0 0   Injury with Fall? 0 0 0   Risk for fall due to : No Fall Risks No Fall Risks No Fall Risks Other (Comment)  Risk for fall due to: Comment    Vertigo, right TKR, needs left TKR  Follow up Falls evaluation completed Education provided;Falls evaluation completed Falls evaluation completed Falls evaluation completed    Past medical history, surgical history, medications, allergies, family history and social history reviewed with patient today and changes made to appropriate areas of the chart.  Past Medical History:  Past Medical History:  Diagnosis Date   AKI (acute kidney injury) (HCC) 03/09/2021   Anxiety    Arthritis    Chronic kidney disease    Coronary artery disease involving native coronary artery of native heart without angina pectoris 07/04/2019   COVID-19 04/19/2022   Depression    Diabetes mellitus without complication (HCC)    Encounter for annual physical exam 05/17/2022   Essential hypertension 10/30/2018   Essential hypertension 10/30/2018   Hyperlipidemia 07/04/2019   Ischemic cardiomyopathy    Myocardial infarction [facility])    Other allergy status, other than to drugs and biological substances 11/10/2020   Palpitations    Perforation of sigmoid colon due to diverticulitis 12/23/2020   Primary localized osteoarthritis of left knee    Primary localized osteoarthritis of left knee    Recurrent major depression in remission (HCC) 11/10/2020   S/P total knee arthroplasty, right    Shortness of breath 03/09/2021   TOA  (tubo-ovarian abscess) 12/07/2021   Medications:  Current Outpatient Medications on File Prior to Visit  Medication Sig   aspirin EC 81 MG EC tablet Take 1 tablet (81 mg total) by mouth daily. (Patient taking differently: Take 81 mg by mouth every morning.)   carvedilol (COREG) 25 MG tablet TAKE 1&1/2 TABLETS BY MOUTH TWICE A DAY   fexofenadine (ALLEGRA) 180 MG tablet Take 180 mg by mouth as needed.   fluticasone (FLONASE) 50 MCG/ACT nasal spray Place 2 sprays into both nostrils as needed.   Meclizine HCl 25 MG CHEW Chew  by mouth as needed.   Multiple Vitamin (MULTIVITAMIN WITH MINERALS) TABS tablet Take 1 tablet by mouth every morning.   nitroGLYCERIN (NITROSTAT) 0.4 MG SL tablet Place 1 tablet (0.4 mg total) under the tongue every 5 (five) minutes x 3 doses as needed for chest pain.   sacubitril-valsartan (ENTRESTO) 49-51 MG TAKE 1 TABLET BY MOUTH TWICE A DAY   Semaglutide, 2 MG/DOSE, 8 MG/3ML SOPN Inject 2 mg as directed once a week.   No current facility-administered medications on file prior to visit.   Surgical History:  Past Surgical History:  Procedure Laterality Date   ABDOMINAL HYSTERECTOMY     CHOLECYSTECTOMY     CORONARY STENT INTERVENTION N/A 07/03/2019   Procedure: CORONARY STENT INTERVENTION;  Surgeon: Tonny Bollman, MD;  Location: [facility] INVASIVE CV LAB;  Service: Cardiovascular;  Laterality: N/A;   LEFT HEART CATH AND CORONARY ANGIOGRAPHY N/A 07/03/2019   Procedure: LEFT HEART CATH AND CORONARY ANGIOGRAPHY;  Surgeon: Tonny Bollman, MD;  Location: [facility] INVASIVE CV LAB;  Service: Cardiovascular;  Laterality: N/A;   RIGHT/LEFT HEART CATH AND CORONARY ANGIOGRAPHY N/A 07/27/2021   Procedure: RIGHT/LEFT HEART CATH AND CORONARY ANGIOGRAPHY;  Surgeon: Yates Decamp, MD;  Location: [facility] INVASIVE CV LAB;  Service: Cardiovascular;  Laterality: N/A;   SHOULDER ARTHROSCOPY WITH SUBACROMIAL DECOMPRESSION, ROTATOR CUFF REPAIR AND BICEP TENDON REPAIR Left 12/08/2022   Procedure: SHOULDER  ARTHROSCOPY WITH SUBACROMIAL DECOMPRESSION, ROTATOR CUFF REPAIR AND BICEP TENDON REPAIR;  Surgeon: Bjorn Pippin, MD;  Location: [facility];  Service: Orthopedics;  Laterality: Left;   TOTAL KNEE ARTHROPLASTY Right 10/13/2015   Procedure: RIGHT TOTAL KNEE ARTHROPLASTY;  Surgeon: Ranee Gosselin, MD;  Location: [facility];  Service: Orthopedics;  Laterality: Right;   TOTAL KNEE ARTHROPLASTY Left 11/23/2020   Procedure: TOTAL KNEE ARTHROPLASTY;  Surgeon: Salvatore Marvel, MD;  Location: [facility];  Service: Orthopedics;  Laterality: Left;   Allergies:  Allergies  Allergen Reactions   Dilaudid [Hydromorphone Hcl] Hives    Can tolerate if premedicated with benadryl   Erythromycin Hives and Nausea And Vomiting   Hydromorphone Hives    hives hives    Can tolerate if premedicated with benadryl   Percocet [Oxycodone-Acetaminophen] Itching and Nausea And Vomiting    Can tolerate with premedication of diphenhydramine and/or nausea medication   Zithromax [Azithromycin] Nausea And Vomiting   Penicillins Hives, Nausea And Vomiting and Rash   Codeine Nausea Only   Metformin Diarrhea   Oxycodone Other (See Comments)    Other reaction(s): nausea, epigastric pain   Cefprozil Rash    rash but takes Ceftin   Other Rash and Other (See Comments)    Gel on Ekg pads   Family History:  Family History  Problem Relation Age of Onset   Heart disease Mother    Multiple myeloma Mother    Heart attack Father 53   CAD Father    Hypertension Sister    Hypertension Sister    Hypertension Sister    Heart failure Brother    Sleep apnea Neg Hx        Objective:    BP 118/72   Pulse 80   Ht 5' 3.25" (1.607 m)   Wt 170 lb 9.6 oz (77.4 kg)   BMI 29.98 kg/m   Wt Readings from Last 3 Encounters:  06/16/23 170 lb 9.6 oz (77.4 kg)  05/03/23 173 lb 6.4 oz (78.7 kg)  05/02/23 174 lb (78.9 kg)    Physical Exam Vitals and nursing note reviewed.  Constitutional:  General: She is not in acute  distress.    Appearance: Normal appearance.  HENT:     Head: Normocephalic and atraumatic.     Right Ear: Hearing, tympanic membrane, ear canal and external ear normal.     Left Ear: Hearing, tympanic membrane, ear canal and external ear normal.     Nose: Nose normal.     Right Sinus: No maxillary sinus tenderness or frontal sinus tenderness.     Left Sinus: No maxillary sinus tenderness or frontal sinus tenderness.     Mouth/Throat:     Lips: Pink.     Mouth: Mucous membranes are moist.     Pharynx: Oropharynx is clear.  Eyes:     General: Lids are normal. Vision grossly intact.     Extraocular Movements: Extraocular movements intact.     Conjunctiva/sclera: Conjunctivae normal.     Pupils: Pupils are equal, round, and reactive to light.     Funduscopic exam:    Right eye: Red reflex present.        Left eye: Red reflex present.    Visual Fields: Right eye visual fields normal and left eye visual fields normal.  Neck:     Thyroid: No thyromegaly.     Vascular: No carotid bruit.  Cardiovascular:     Rate and Rhythm: Normal rate and regular rhythm.     Chest Wall: PMI is not displaced.     Pulses: Normal pulses.          Dorsalis pedis pulses are 2+ on the right side and 2+ on the left side.       Posterior tibial pulses are 2+ on the right side and 2+ on the left side.     Heart sounds: Normal heart sounds. No murmur heard. Pulmonary:     Effort: Pulmonary effort is normal. No respiratory distress.     Breath sounds: Normal breath sounds.  Abdominal:     General: Abdomen is flat. Bowel sounds are normal. There is no distension.     Palpations: Abdomen is soft. There is no hepatomegaly, splenomegaly or mass.     Tenderness: There is no abdominal tenderness. There is no right CVA tenderness, left CVA tenderness, guarding or rebound.  Musculoskeletal:        General: Normal range of motion.     Cervical back: Full passive range of motion without pain, normal range of motion and  neck supple. No tenderness.     Right lower leg: No edema.     Left lower leg: No edema.  Feet:     Left foot:     Toenail Condition: Left toenails are normal.  Lymphadenopathy:     Cervical: No cervical adenopathy.     Upper Body:     Right upper body: No supraclavicular adenopathy.     Left upper body: No supraclavicular adenopathy.  Skin:    General: Skin is warm and dry.     Capillary Refill: Capillary refill takes less than 2 seconds.     Nails: There is no clubbing.  Neurological:     General: No focal deficit present.     Mental Status: She is alert and oriented to person, place, and time.     GCS: GCS eye subscore is 4. GCS verbal subscore is 5. GCS motor subscore is 6.     Sensory: Sensation is intact.     Motor: Motor function is intact.     Coordination: Coordination is intact.  Gait: Gait is intact.     Deep Tendon Reflexes: Reflexes are normal and symmetric.  Psychiatric:        Attention and Perception: Attention normal.        Mood and Affect: Mood normal.        Speech: Speech normal.        Behavior: Behavior normal. Behavior is cooperative.        Thought Content: Thought content normal.        Cognition and Memory: Cognition and memory normal.        Judgment: Judgment normal.      Results for orders placed or performed in visit on 02/15/23  Lab report - scanned  Result Value Ref Range   Creatinine, POC 119.4 mg/dL   EGFR 11.9        Assessment & Plan:   Problem List Items Addressed This Visit     Hyperlipidemia associated with type 2 diabetes mellitus (HCC)    Chronic. Stable. Currently followed with cardiology. No concerns with medication. Refills provided today. Labs pending.       Relevant Medications   dapagliflozin propanediol (FARXIGA) 10 MG TABS tablet   amLODipine (NORVASC) 10 MG tablet   ezetimibe (ZETIA) 10 MG tablet   atorvastatin (LIPITOR) 80 MG tablet   Other Relevant Orders   Hemoglobin A1c   CBC with  Differential/Platelet   Comprehensive metabolic panel   Lipid panel   VITAMIN D 25 Hydroxy (Vit-D Deficiency, Fractures)   Coronary artery disease of native artery of native heart with stable angina pectoris (HCC)    Chronic. Currently followed with cardiology. Doing well enough that visits are annually now. No symptoms or concerns. No angina reported. Refills for all medications due sent today. Labs pending.       Relevant Medications   buPROPion (WELLBUTRIN XL) 300 MG 24 hr tablet   escitalopram (LEXAPRO) 20 MG tablet   amLODipine (NORVASC) 10 MG tablet   ezetimibe (ZETIA) 10 MG tablet   atorvastatin (LIPITOR) 80 MG tablet   Other Relevant Orders   Hemoglobin A1c   CBC with Differential/Platelet   Comprehensive metabolic panel   Lipid panel   VITAMIN D 25 Hydroxy (Vit-D Deficiency, Fractures)   Stage 3a chronic kidney disease (CKD) (HCC)    Chronic. BP and blood sugar well controlled. Currently on Farxiga. Labs pending. No changes      Relevant Orders   Hemoglobin A1c   CBC with Differential/Platelet   Comprehensive metabolic panel   Lipid panel   VITAMIN D 25 Hydroxy (Vit-D Deficiency, Fractures)   Type 2 diabetes mellitus with stage 3a chronic kidney disease, without long-term current use of insulin (HCC)    Chronic. Managing with ozempic and farxiga with no alarm symptoms. Discontinuation of the Dexcom due to repeated malfunction discussed. She will continue with finger sticks. Weight loss of 20lbs and continuing on diet and exercise. No changes today. Labs pending. I am very proud of her efforts and success!      Relevant Medications   dapagliflozin propanediol (FARXIGA) 10 MG TABS tablet   atorvastatin (LIPITOR) 80 MG tablet   Other Relevant Orders   Hemoglobin A1c   CBC with Differential/Platelet   Comprehensive metabolic panel   Lipid panel   VITAMIN D 25 Hydroxy (Vit-D Deficiency, Fractures)   Non-ischemic cardiomyopathy (HCC)    No alarm symptoms present at this  time. Continue to monitor.       Relevant Medications   amLODipine (NORVASC) 10 MG  tablet   ezetimibe (ZETIA) 10 MG tablet   atorvastatin (LIPITOR) 80 MG tablet   Chronic combined systolic and diastolic HF (heart failure) (HCC)    Chronic. Currently followed with cardiology. Doing well enough that visits are annually now. No symptoms or concerns. Refills for all medications due sent today. Labs pending.       Relevant Medications   amLODipine (NORVASC) 10 MG tablet   ezetimibe (ZETIA) 10 MG tablet   atorvastatin (LIPITOR) 80 MG tablet   Other Relevant Orders   Hemoglobin A1c   CBC with Differential/Platelet   Comprehensive metabolic panel   Lipid panel   VITAMIN D 25 Hydroxy (Vit-D Deficiency, Fractures)   Hypertension associated with stage 3 chronic kidney disease due to type 2 diabetes mellitus (HCC)    Chronic. Blood pressures are perfect today! She has had successful weight loss efforts and positive life changes for her overall health. I am so proud of her. Medication refills sent in today. Labs pending.       Relevant Medications   dapagliflozin propanediol (FARXIGA) 10 MG TABS tablet   amLODipine (NORVASC) 10 MG tablet   ezetimibe (ZETIA) 10 MG tablet   atorvastatin (LIPITOR) 80 MG tablet   Other Relevant Orders   Hemoglobin A1c   CBC with Differential/Platelet   Comprehensive metabolic panel   Lipid panel   VITAMIN D 25 Hydroxy (Vit-D Deficiency, Fractures)   Encounter for annual physical exam - Primary    CPE completed today. Review of HM activities and recommendations discussed and provided on AVS. Anticipatory guidance, diet, and exercise recommendations provided. Medications, allergies, and hx reviewed and updated as necessary. Orders placed as listed below.  Plan: - Labs ordered. Will make changes as necessary based on results.  - I will review these results and send recommendations via MyChart or a telephone call.  - F/U with CPE in 1 year or sooner for  acute/chronic health needs as directed.        Relevant Medications   buPROPion (WELLBUTRIN XL) 300 MG 24 hr tablet   dapagliflozin propanediol (FARXIGA) 10 MG TABS tablet   escitalopram (LEXAPRO) 20 MG tablet   amLODipine (NORVASC) 10 MG tablet   ezetimibe (ZETIA) 10 MG tablet   atorvastatin (LIPITOR) 80 MG tablet   Other Relevant Orders   Hemoglobin A1c   CBC with Differential/Platelet   Comprehensive metabolic panel   Lipid panel   VITAMIN D 25 Hydroxy (Vit-D Deficiency, Fractures)   Diabetic renal disease (HCC)    Chronic. Blood sugars are well controlled per patient and she has had successful weight loss. BP is wonderful today. All findings are very reassuring for her kidney disease. She is managed with [place]. Labs pending today.       Relevant Medications   dapagliflozin propanediol (FARXIGA) 10 MG TABS tablet   atorvastatin (LIPITOR) 80 MG tablet   Other Relevant Orders   Hemoglobin A1c   CBC with Differential/Platelet   Comprehensive metabolic panel   Lipid panel   VITAMIN D 25 Hydroxy (Vit-D Deficiency, Fractures)   Reactive depression (situational)   Relevant Medications   buPROPion (WELLBUTRIN XL) 300 MG 24 hr tablet   escitalopram (LEXAPRO) 20 MG tablet   Sickle cell trait (HCC)   Relevant Orders   Hemoglobin A1c   CBC with Differential/Platelet   Comprehensive metabolic panel   Lipid panel   VITAMIN D 25 Hydroxy (Vit-D Deficiency, Fractures)   Other Visit Diagnoses     Need for influenza vaccination  Relevant Orders   Flu vaccine trivalent PF, 6mos and older(Flulaval,Afluria,Fluarix,Fluzone) (Completed)   Need for COVID-19 vaccine       Relevant Orders   Pfizer Comirnaty Covid -19 Vaccine 85yrs and older (Completed)         Follow up plan: No follow-ups on file.  NEXT PREVENTATIVE PHYSICAL DUE IN 1 YEAR.  PATIENT COUNSELING PROVIDED FOR ALL ADULT PATIENTS: A well balanced diet low in saturated fats, cholesterol, and moderation in  carbohydrates.  This can be as simple as monitoring portion sizes and cutting back on sugary beverages such as soda and juice to start with.    Daily water consumption of at least 64 ounces.  Physical activity at least 180 minutes per week.  If just starting out, start 10 minutes a day and work your way up.   This can be as simple as taking the stairs instead of the elevator and walking 2-3 laps around the office  purposefully every day.   STD protection, partner selection, and regular testing if high risk.  Limited consumption of alcoholic beverages if alcohol is consumed. For men, I recommend no more than 14 alcoholic beverages per week, spread out throughout the week (max 2 per day). Avoid "binge" drinking or consuming large quantities of alcohol in one setting.  Please let me know if you feel you may need help with reduction or quitting alcohol consumption.   Avoidance of nicotine, if used. Please let me know if you feel you may need help with reduction or quitting nicotine use.   Daily mental health attention. This can be in the form of 5 minute daily meditation, prayer, journaling, yoga, reflection, etc.  Purposeful attention to your emotions and mental state can significantly improve your overall wellbeing  and  Health.  Please know that I am here to help you with all of your health care goals and am happy to work with you to find a solution that works best for you.  The greatest advice I have received with any changes in life are to take it one step at a time, that even means if all you can focus on is the next 60 seconds, then do that and celebrate your victories.  With any changes in life, you will have set backs, and that is OK. The important thing to remember is, if you have a set back, it is not a failure, it is an opportunity to try again! Screening Testing Mammogram Every 1 -2 years based on history and risk factors Starting at age 4 Pap Smear Ages 21-39 every 3  years Ages 67-65 every 5 years with HPV testing More frequent testing may be required based on results and history Colon Cancer Screening Every 1-10 years based on test performed, risk factors, and history Starting at age 69 Bone Density Screening Every 2-10 years based on history Starting at age 65 for women Recommendations for men differ based on medication usage, history, and risk factors AAA Screening One time ultrasound Men 77-55 years old who have every smoked Lung Cancer Screening Low Dose Lung CT every 12 months Age 101-80 years with a 30 pack-year smoking history who still smoke or who have quit within the last 15 years   Screening Labs Routine  Labs: Complete Blood Count (CBC), Complete Metabolic Panel (CMP), Cholesterol (Lipid Panel) Every 6-12 months based on history and medications May be recommended more frequently based on current conditions or previous results Hemoglobin A1c Lab Every 3-12 months based on history and  previous results Starting at age 14 or earlier with diagnosis of diabetes, high cholesterol, BMI >26, and/or risk factors Frequent monitoring for patients with diabetes to ensure blood sugar control Thyroid Panel (TSH) Every 6 months based on history, symptoms, and risk factors May be repeated more often if on medication HIV One time testing for all patients 44 and older May be repeated more frequently for patients with increased risk factors or exposure Hepatitis C One time testing for all patients 36 and older May be repeated more frequently for patients with increased risk factors or exposure Gonorrhea, Chlamydia Every 12 months for all sexually active persons 13-24 years Additional monitoring may be recommended for those who are considered high risk or who have symptoms Every 12 months for any woman on birth control, regardless of sexual activity PSA Men 22-53 years old with risk factors Additional screening may be recommended from age 589-69  based on risk factors, symptoms, and history  Vaccine Recommendations Tetanus Booster All adults every 10 years Flu Vaccine All patients 6 months and older every year COVID Vaccine All patients 12 years and older Initial dosing with booster May recommend additional booster based on age and health history HPV Vaccine 2 doses all patients age 58-26 Dosing may be considered for patients over 26 Shingles Vaccine (Shingrix) 2 doses all adults 55 years and older Pneumonia (Pneumovax 57) All adults 65 years and older May recommend earlier dosing based on health history One year apart from Prevnar 34 Pneumonia (Prevnar 85) All adults 65 years and older Dosed 1 year after Pneumovax 23 Pneumonia (Prevnar 20) One time alternative to the two dosing of 13 and 23 For all adults with initial dose of 23, 20 is recommended 1 year later For all adults with initial dose of 13, 23 is still recommended as second option 1 year later

## 2023-06-16 NOTE — Assessment & Plan Note (Signed)
Chronic. BP and blood sugar well controlled. Currently on Farxiga. Labs pending. No changes

## 2023-06-16 NOTE — Assessment & Plan Note (Signed)
Chronic. Stable. Currently followed with cardiology. No concerns with medication. Refills provided today. Labs pending.

## 2023-06-16 NOTE — Assessment & Plan Note (Signed)
Chronic. Managing with ozempic and farxiga with no alarm symptoms. Discontinuation of the Dexcom due to repeated malfunction discussed. She will continue with finger sticks. Weight loss of 20lbs and continuing on diet and exercise. No changes today. Labs pending. I am very proud of her efforts and success!

## 2023-06-16 NOTE — Assessment & Plan Note (Signed)
Chronic. Currently followed with cardiology. Doing well enough that visits are annually now. No symptoms or concerns. Refills for all medications due sent today. Labs pending.

## 2023-06-16 NOTE — Assessment & Plan Note (Signed)
Chronic. Blood sugars are well controlled per patient and she has had successful weight loss. BP is wonderful today. All findings are very reassuring for her kidney disease. She is managed with [place]. Labs pending today.

## 2023-06-17 LAB — COMPREHENSIVE METABOLIC PANEL
ALT: 16 IU/L (ref 0–32)
AST: 20 IU/L (ref 0–40)
Albumin: 4.1 g/dL (ref 3.8–4.9)
Alkaline Phosphatase: 96 IU/L (ref 44–121)
BUN/Creatinine Ratio: 11 (ref 9–23)
BUN: 15 mg/dL (ref 6–24)
Bilirubin Total: 0.6 mg/dL (ref 0.0–1.2)
CO2: 24 mmol/L (ref 20–29)
Calcium: 8.9 mg/dL (ref 8.7–10.2)
Chloride: 105 mmol/L (ref 96–106)
Creatinine, Ser: 1.32 mg/dL — ABNORMAL HIGH (ref 0.57–1.00)
Globulin, Total: 2.4 g/dL (ref 1.5–4.5)
Glucose: 105 mg/dL — ABNORMAL HIGH (ref 70–99)
Potassium: 3.5 mmol/L (ref 3.5–5.2)
Sodium: 146 mmol/L — ABNORMAL HIGH (ref 134–144)
Total Protein: 6.5 g/dL (ref 6.0–8.5)
eGFR: 47 mL/min/{1.73_m2} — ABNORMAL LOW (ref >59)

## 2023-06-17 LAB — CBC WITH DIFFERENTIAL/PLATELET
Basophils Absolute: 0.1 10*3/uL (ref 0.0–0.2)
Basos: 1 %
EOS (ABSOLUTE): 0.3 10*3/uL (ref 0.0–0.4)
Eos: 3 %
Hematocrit: 38.6 % (ref 34.0–46.6)
Hemoglobin: 12.7 g/dL (ref 11.1–15.9)
Immature Grans (Abs): 0 10*3/uL (ref 0.0–0.1)
Immature Granulocytes: 0 %
Lymphocytes Absolute: 1.7 10*3/uL (ref 0.7–3.1)
Lymphs: 21 %
MCH: 28.5 pg (ref 26.6–33.0)
MCHC: 32.9 g/dL (ref 31.5–35.7)
MCV: 87 fL (ref 79–97)
Monocytes Absolute: 0.7 10*3/uL (ref 0.1–0.9)
Monocytes: 9 %
Neutrophils Absolute: 5.4 10*3/uL (ref 1.4–7.0)
Neutrophils: 66 %
Platelets: 260 10*3/uL (ref 150–450)
RBC: 4.45 x10E6/uL (ref 3.77–5.28)
RDW: 14.5 % (ref 11.7–15.4)
WBC: 8.1 10*3/uL (ref 3.4–10.8)

## 2023-06-17 LAB — LIPID PANEL
Chol/HDL Ratio: 2.5 ratio (ref 0.0–4.4)
Cholesterol, Total: 136 mg/dL (ref 100–199)
HDL: 54 mg/dL (ref >39)
LDL Chol Calc (NIH): 68 mg/dL (ref 0–99)
Triglycerides: 71 mg/dL (ref 0–149)
VLDL Cholesterol Cal: 14 mg/dL (ref 5–40)

## 2023-06-17 LAB — VITAMIN D 25 HYDROXY (VIT D DEFICIENCY, FRACTURES): Vit D, 25-Hydroxy: 56.2 ng/mL (ref 30.0–100.0)

## 2023-06-17 LAB — HEMOGLOBIN A1C
Est. average glucose Bld gHb Est-mCnc: 128 mg/dL
Hgb A1c MFr Bld: 6.1 % — ABNORMAL HIGH (ref 4.8–5.6)

## 2023-06-21 ENCOUNTER — Other Ambulatory Visit: Payer: Self-pay | Admitting: Cardiology

## 2023-06-21 DIAGNOSIS — I5042 Chronic combined systolic (congestive) and diastolic (congestive) heart failure: Secondary | ICD-10-CM

## 2023-10-18 ENCOUNTER — Ambulatory Visit: Payer: Self-pay | Admitting: Nurse Practitioner

## 2023-10-18 NOTE — Telephone Encounter (Signed)
  Chief Complaint: productive cough Symptoms: productive cough with grey sputum, dry irritated throat Frequency: x 4 weeks Pertinent Negatives: Patient denies fever, SOB, swelling, body aches, wheezing, chest pain Disposition: [] ED /[] Urgent Care (no appt availability in office) / [x] Appointment(In office/virtual)/ []  [place] [facility]/ [] Home Care/ [] Refused Recommended Disposition /[] [facility]/ []  Follow-up with PCP Additional Notes: Patient states she travelled and caught the flu at the end of January/early February. She states she was seen at the [place] and prescribed Tamiflu, nasal spray and tessalon perles. Patient states she has also taken Coricidin and has been drinking plenty of fluids. Patient agreeable to office visit tomorrow.  Copied from CRM [id number]. Topic: Clinical - Red Word Triage >> Oct 18, 2023  8:52 AM Shon Hale wrote: Red Word that prompted transfer to Nurse Triage: Worsening cough, keeping patient up at night. Reason for Disposition  Cough has been present for > 3 weeks  Answer Assessment - Initial Assessment Questions 1. ONSET: "When did the cough begin?"      End of January.  2. SEVERITY: "How bad is the cough today?"      5-6/10.  3. SPUTUM: "Describe the color of your sputum" (none, dry cough; clear, white, yellow, green)     States when it first started it was green/yellow, states it is less productive now and is a grey color.  4. HEMOPTYSIS: "Are you coughing up any blood?" If so ask: "How much?" (flecks, streaks, tablespoons, etc.)     Denies.  5. DIFFICULTY BREATHING: "Are you having difficulty breathing?" If Yes, ask: "How bad is it?" (e.g., mild, moderate, severe)    - MILD: No SOB at rest, mild SOB with walking, speaks normally in sentences, can lie down, no retractions, pulse < 100.    - MODERATE: SOB at rest, SOB with minimal exertion and prefers to sit, cannot lie down flat, speaks in phrases, mild retractions, audible wheezing, pulse  100-120.    - SEVERE: Very SOB at rest, speaks in single words, struggling to breathe, sitting hunched forward, retractions, pulse > 120      Denies.  6. FEVER: "Do you have a fever?" If Yes, ask: "What is your temperature, how was it measured, and when did it start?"     Denies.  7. CARDIAC HISTORY: "Do you have any history of heart disease?" (e.g., heart attack, congestive heart failure)      Yes, CHF.  8. LUNG HISTORY: "Do you have any history of lung disease?"  (e.g., pulmonary embolus, asthma, emphysema)     Denies.  9. PE RISK FACTORS: "Do you have a history of blood clots?" (or: recent major surgery, recent prolonged travel, bedridden)     Denies.  10. OTHER SYMPTOMS: "Do you have any other symptoms?" (e.g., runny nose, wheezing, chest pain)       Tickle in throat/dry throat.  11. TRAVEL: "Have you traveled out of the country in the last month?" (e.g., travel history, exposures)       Patient states she caught the flu end of January, early February.  Protocols used: Cough - Acute Productive-A-AH

## 2023-10-19 ENCOUNTER — Encounter: Payer: Self-pay | Admitting: Family Medicine

## 2023-10-19 ENCOUNTER — Ambulatory Visit: Payer: BC Managed Care – PPO | Admitting: Family Medicine

## 2023-10-19 VITALS — BP 116/70 | HR 80 | Temp 98.4°F | Wt 172.6 lb

## 2023-10-19 DIAGNOSIS — J301 Allergic rhinitis due to pollen: Secondary | ICD-10-CM | POA: Diagnosis not present

## 2023-10-19 DIAGNOSIS — J209 Acute bronchitis, unspecified: Secondary | ICD-10-CM | POA: Diagnosis not present

## 2023-10-19 MED ORDER — DOXYCYCLINE HYCLATE 100 MG PO TABS
100.0000 mg | ORAL_TABLET | Freq: Two times a day (BID) | ORAL | 0 refills | Status: DC
Start: 2023-10-19 — End: 2023-10-27

## 2023-10-19 NOTE — Patient Instructions (Signed)
 Take all the antibiotic and if not totally back to normal when you finish leave a message on MyChart

## 2023-10-19 NOTE — Progress Notes (Signed)
   Subjective:    Patient ID: Andrea Robertson, female    DOB: 12-25-1960, 63 y.o.   MRN: [id number]  HPI She is here for evaluation of continued difficulty with cough.  In late January she developed fever, rhinorrhea, myalgias and went to the [place].  They did gave her Tamiflu and Tessalon to treat the flu however since then she has continued to cough as well as having some chest discomfort when she coughs.  The cough is now become productive but no fever, chills, sore throat.  She does have underlying allergies but they are usually in the spring and fall.  Does not smoke.   Review of Systems     Objective:    Physical Exam Alert and in no distress. Tympanic membranes and canals are normal. Pharyngeal area is normal. Neck is supple without adenopathy or thyromegaly. Cardiac exam shows a regular sinus rhythm without murmurs or gallops. Lungs are clear to auscultation. She is allergic to erythromycin and penicillin       Assessment & Plan:  Acute bronchitis, unspecified organism - Plan: doxycycline (VIBRA-TABS) 100 MG tablet  Seasonal allergic rhinitis due to pollen Instructed her on proper use of nasal steroid spray. She is also to take all the antibiotic but if still having difficulty when she finishes it, recommend she call for refill.  She was comfortable with that.

## 2023-10-27 ENCOUNTER — Ambulatory Visit: Payer: Self-pay | Admitting: Nurse Practitioner

## 2023-10-27 ENCOUNTER — Other Ambulatory Visit: Payer: Self-pay | Admitting: Medical

## 2023-10-27 DIAGNOSIS — J209 Acute bronchitis, unspecified: Secondary | ICD-10-CM

## 2023-10-27 MED ORDER — DOXYCYCLINE HYCLATE 100 MG PO TABS: 100.0000 mg | ORAL_TABLET | Freq: Two times a day (BID) | ORAL | 0 refills | Status: DC

## 2023-10-27 NOTE — Telephone Encounter (Signed)
 Called patient, no answer left voice mail and I am also sending My Chart message

## 2023-10-27 NOTE — Telephone Encounter (Signed)
  Chief Complaint: wet cough and both sinus and chest congestion continues Symptoms: above Frequency: 10/19/2023 Pertinent Negatives: Patient denies fever Disposition: [] ED /[] Urgent Care (no appt availability in office) / [] Appointment(In office/virtual)/ []  [facility]/ [] Home Care/ [] Refused Recommended Disposition /[] [facility]/ [x]  Follow-up with PCP Additional Notes: Pt reports that congestion/ cough has not improved. Per OV note pt is to call back if this happens for additional ABX.     Copied from CRM [id number]. Topic: Clinical - Pink Word Triage >> Oct 27, 2023 10:22 AM Priscille Loveless wrote: Reason for Triage: Pt called in and stated that she went to the dr last week and she had the flu/virus. The dr gave her medication but its been a week and she isnt seeing much progress. Is there any other medication that she can take to help her feel better. Please advise patient.  From OV of 10/19/2023   Assessment & Plan:  Acute bronchitis, unspecified organism - Plan: doxycycline (VIBRA-TABS) 100 MG tablet   Seasonal allergic rhinitis due to pollen Instructed her on proper use of nasal steroid spray. She is also to take all the antibiotic but if still having difficulty when she finishes it, recommend she call for refill.  She was comfortable with that. Reason for Disposition  [1] Taking antibiotic > 7 days AND [2] nasal discharge not improved  Answer Assessment - Initial Assessment Questions 1. ANTIBIOTIC: "What antibiotic are you taking?" "How many times a day?"     Doxycycline 2. ONSET: "When was the antibiotic started?"     10/19/2023 3. PAIN: "How bad is the sinus pain?"   (Scale 1-10; mild, moderate or severe)   - MILD (1-3): doesn't interfere with normal activities    - MODERATE (4-7): interferes with normal activities (e.g., work or school) or awakens from sleep   - SEVERE (8-10): excruciating pain and patient unable to do any normal activities        N/a 4. FEVER:  "Do you have a fever?" If Yes, ask: "What is it, how was it measured, and when did it start?"      no 5. SYMPTOMS: "Are there any other symptoms you're concerned about?" If Yes, ask: "When did it start?"     Wet cough producing thick phlegm. Lighter in color now. Sinus and chest congestion.  Protocols used: Sinus Infection on Antibiotic Follow-up Call-A-AH

## 2023-10-27 NOTE — Telephone Encounter (Signed)
 Summary: Flu/virus   Copied From CRM [id number]. Reason for Triage: Pt called in and stated that she went to the dr last week and she had the flu/virus. The dr gave her medication but its been a week and she isnt seeing much progress. Is there any other medication that she can take to help her feel better. Please advise patient.

## 2023-10-27 NOTE — Telephone Encounter (Signed)
 Please advise. She saw Dr. Susann Givens last appt here

## 2023-11-23 ENCOUNTER — Telehealth: Payer: Self-pay | Admitting: Family Medicine

## 2023-11-23 NOTE — Telephone Encounter (Signed)
 LVM at 3:09 pm need to reschedule appt. Contacted patient to reschedule on 06/12/24

## 2023-11-23 NOTE — Progress Notes (Deleted)
 PATIENT: Andrea Robertson DOB: [birth year]  REASON FOR VISIT: follow up HISTORY FROM: patient  No chief complaint on file.    HISTORY OF PRESENT ILLNESS:  11/23/23 ALL:  Noreta returns for follow up for obstructive sleep apnea. She was last seen for initial autoPAP visit 04/2023 and was having difficulty adjusting to therapy.   Supine?  05/03/2023 ALL:  Andrea Robertson is a 63 y.o. female here today for follow up for OSA on CPAP. She was seen in consult with Dr Frances Furbish 09/2022 for concerns of snoring, witnessed apneic events and excessive daytime sleepiness. PSG showed "mild obstructive sleep apnea, more pronounced during supine sleep with a total AHI of 10/h, REM AHI of 7.2/h, supine AHI of 96.9/h, O2 nadir 87%." AutoPAP advised. Since, she reports not doing well on therapy. She has a hard time breathing with mask. She doesn't like her current mask but was told she can not switch until October. She has not noted any significant difference in sleep quality or daytime energy levels with therapy. She sleeps well. Denies EDS. She was recently seen by cardiology and reports CHF/BP are well managed.     HISTORY: (copied from Dr Teofilo Pod previous note)  Dear Huntley Dec,   I saw your patient, Andrea Robertson, upon your kind request in my sleep clinic today for initial consultation of her sleep disorder, in particular, concern for underlying obstructive sleep apnea.  The patient is unaccompanied today.  As you know, Ms. Staples is a 63 year old female with an underlying complex medical history of diabetes, hypertension, chronic kidney disease, hyperlipidemia, nonischemic cardiomyopathy, coronary artery disease with history of MI, status post stent placement, sickle cell trait, depression, anxiety, reflux disease, allergies, palpitations, and obesity, who reports snoring and excessive daytime somnolence as well as witnessed apneas per husband's report.  Her Epworth sleepiness score is 4 out of 24,  fatigue severity score is 14 out of 63.  I reviewed your office note from 08/30/2022.  She had a sleep study several years ago, approximately 15 or 20 years ago which was negative for sleep apnea at the time.  Prior sleep study results are not available for my review today.  She lives with her husband, she has 2 grown children.  She has a dog in the household but the dog is primarily outside.  Bedtime is generally between 8:30 PM and 9 PM but lately it has been closer to 11 or 11:30 PM as she is in school for her business management degree.  She works full-time as an Biomedical engineer.  Rise time is generally around 6:30 AM to 7 AM.  She has no recurrent morning headaches, no night to night nocturia.  Her sister has sleep apnea and has a CPAP machine.  Patient cut back on caffeine intake and limits her caffeine to 1 cup of coffee per day generally.  She drinks alcohol rarely, she is a non-smoker.  She does have a TV in her bedroom but it is typically not on at night.  She is a side sleeper and stomach sleeper, cannot sleep on her back especially flat on her back.   REVIEW OF SYSTEMS: Out of a complete 14 system review of symptoms, the patient complains only of the following symptoms, none and all other reviewed systems are negative.  ESS: 2/24, previously 4/24  ALLERGIES: Allergies  Allergen Reactions   Dilaudid [Hydromorphone Hcl] Hives    Can tolerate if premedicated with benadryl   Erythromycin Hives and Nausea And Vomiting  Hydromorphone Hives    hives hives    Can tolerate if premedicated with benadryl   Percocet [Oxycodone-Acetaminophen] Itching and Nausea And Vomiting    Can tolerate with premedication of diphenhydramine and/or nausea medication   Zithromax [Azithromycin] Nausea And Vomiting   Penicillins Hives, Nausea And Vomiting and Rash   Codeine Nausea Only   Metformin Diarrhea   Oxycodone Other (See Comments)    Other reaction(s): nausea, epigastric pain   Cefprozil Rash    rash  but takes Ceftin   Other Rash and Other (See Comments)    Gel on Ekg pads    HOME MEDICATIONS: Outpatient Medications Prior to Visit  Medication Sig Dispense Refill   amLODipine (NORVASC) 10 MG tablet Take 1 tablet (10 mg total) by mouth daily. 90 tablet 3   aspirin EC 81 MG EC tablet Take 1 tablet (81 mg total) by mouth daily. (Patient taking differently: Take 81 mg by mouth every morning.) 90 tablet 3   atorvastatin (LIPITOR) 80 MG tablet Take 1 tablet (80 mg total) by mouth daily. 90 tablet 3   buPROPion (WELLBUTRIN XL) 300 MG 24 hr tablet Take 1 tablet (300 mg total) by mouth daily. 90 tablet 3   carvedilol (COREG) 25 MG tablet TAKE 1&1/2 TABLETS BY MOUTH TWICE A DAY 270 tablet 3   dapagliflozin propanediol (FARXIGA) 10 MG TABS tablet Take 1 tablet (10 mg total) by mouth daily before breakfast. 90 tablet 3   Dextromethorphan-guaiFENesin (DELSYM COUGH/CHEST CONGEST DM) 5-100 MG/5ML LIQD Take 5 mLs by mouth daily.     doxycycline (VIBRA-TABS) 100 MG tablet Take 1 tablet (100 mg total) by mouth 2 (two) times daily. 10 tablet 0   ENTRESTO 49-51 MG TAKE 1 TABLET BY MOUTH TWICE A DAY 180 tablet 2   escitalopram (LEXAPRO) 20 MG tablet Take 1 tablet (20 mg total) by mouth every morning. 90 tablet 3   ezetimibe (ZETIA) 10 MG tablet Take 1 tablet (10 mg total) by mouth daily. 90 tablet 3   fexofenadine (ALLEGRA) 180 MG tablet Take 180 mg by mouth as needed. (Patient not taking: Reported on 10/19/2023)     fluticasone (FLONASE) 50 MCG/ACT nasal spray Place 2 sprays into both nostrils as needed. (Patient not taking: Reported on 10/19/2023)     Meclizine HCl 25 MG CHEW Chew by mouth as needed. (Patient not taking: Reported on 10/19/2023)     Multiple Vitamin (MULTIVITAMIN WITH MINERALS) TABS tablet Take 1 tablet by mouth every morning.     nitroGLYCERIN (NITROSTAT) 0.4 MG SL tablet Place 1 tablet (0.4 mg total) under the tongue every 5 (five) minutes x 3 doses as needed for chest pain. 25 tablet 1    Semaglutide, 2 MG/DOSE, 8 MG/3ML SOPN Inject 2 mg as directed once a week. 9 mL 3   No facility-administered medications prior to visit.    PAST MEDICAL HISTORY: Past Medical History:  Diagnosis Date   AKI (acute kidney injury) (HCC) 03/09/2021   Anxiety    Arthritis    Chronic kidney disease    Coronary artery disease involving native coronary artery of native heart without angina pectoris 07/04/2019   COVID-19 04/19/2022   Depression    Diabetes mellitus without complication (HCC)    Encounter for annual physical exam 05/17/2022   Essential hypertension 10/30/2018   Essential hypertension 10/30/2018   Hyperlipidemia 07/04/2019   Ischemic cardiomyopathy    Myocardial infarction [facility])    Other allergy status, other than to drugs and biological substances 11/10/2020  Palpitations    Perforation of sigmoid colon due to diverticulitis 12/23/2020   Primary localized osteoarthritis of left knee    Primary localized osteoarthritis of left knee    Recurrent major depression in remission (HCC) 11/10/2020   S/P total knee arthroplasty, right    Shortness of breath 03/09/2021   TOA (tubo-ovarian abscess) 12/07/2021    PAST SURGICAL HISTORY: Past Surgical History:  Procedure Laterality Date   ABDOMINAL HYSTERECTOMY     CHOLECYSTECTOMY     CORONARY STENT INTERVENTION N/A 07/03/2019   Procedure: CORONARY STENT INTERVENTION;  Surgeon: Tonny Bollman, MD;  Location: [facility] INVASIVE CV LAB;  Service: Cardiovascular;  Laterality: N/A;   LEFT HEART CATH AND CORONARY ANGIOGRAPHY N/A 07/03/2019   Procedure: LEFT HEART CATH AND CORONARY ANGIOGRAPHY;  Surgeon: Tonny Bollman, MD;  Location: [facility] INVASIVE CV LAB;  Service: Cardiovascular;  Laterality: N/A;   RIGHT/LEFT HEART CATH AND CORONARY ANGIOGRAPHY N/A 07/27/2021   Procedure: RIGHT/LEFT HEART CATH AND CORONARY ANGIOGRAPHY;  Surgeon: Yates Decamp, MD;  Location: [facility] INVASIVE CV LAB;  Service: Cardiovascular;  Laterality: N/A;   SHOULDER  ARTHROSCOPY WITH SUBACROMIAL DECOMPRESSION, ROTATOR CUFF REPAIR AND BICEP TENDON REPAIR Left 12/08/2022   Procedure: SHOULDER ARTHROSCOPY WITH SUBACROMIAL DECOMPRESSION, ROTATOR CUFF REPAIR AND BICEP TENDON REPAIR;  Surgeon: Bjorn Pippin, MD;  Location: [facility];  Service: Orthopedics;  Laterality: Left;   TOTAL KNEE ARTHROPLASTY Right 10/13/2015   Procedure: RIGHT TOTAL KNEE ARTHROPLASTY;  Surgeon: Ranee Gosselin, MD;  Location: [facility];  Service: Orthopedics;  Laterality: Right;   TOTAL KNEE ARTHROPLASTY Left 11/23/2020   Procedure: TOTAL KNEE ARTHROPLASTY;  Surgeon: Salvatore Marvel, MD;  Location: [facility];  Service: Orthopedics;  Laterality: Left;    FAMILY HISTORY: Family History  Problem Relation Age of Onset   Heart disease Mother    Multiple myeloma Mother    Heart attack Father 100   CAD Father    Hypertension Sister    Hypertension Sister    Hypertension Sister    Heart failure Brother    Sleep apnea Neg Hx     SOCIAL HISTORY: Social History   Socioeconomic History   Marital status: Married    Spouse name: Not on file   Number of children: 2   Years of education: Not on file   Highest education level: Not on file  Occupational History   Not on file  Tobacco Use   Smoking status: Never   Smokeless tobacco: Never  Vaping Use   Vaping status: Never Used  Substance and Sexual Activity   Alcohol use: Yes    Comment: occ   Drug use: No   Sexual activity: Yes    Birth control/protection: Surgical    Comment: Hyst  Other Topics Concern   Not on file  Social History Narrative   Not on file   Social Drivers of Health   Financial Resource Strain: Not on file  Food Insecurity: No Food Insecurity (10/08/2019)   Hunger Vital Sign    Worried About Running Out of Food in the Last Year: Never true    Ran Out of Food in the Last Year: Never true  Transportation Needs: No Transportation Needs (10/08/2019)   PRAPARE - Administrator, Civil Service  (Medical): No    Lack of Transportation (Non-Medical): No  Physical Activity: Not on file  Stress: No Stress Concern Present (10/08/2019)   [employer] of Occupational Health - Occupational Stress Questionnaire    Feeling of Stress :  Only a little  Social Connections: Socially Integrated (10/08/2019)   Social Connection and Isolation Panel [NHANES]    Frequency of Communication with Friends and Family: More than three times a week    Frequency of Social Gatherings with Friends and Family: Three times a week    Attends Religious Services: More than 4 times per year    Active Member of Clubs or Organizations: No    Attends Engineer, structural: More than 4 times per year    Marital Status: Married  Catering manager Violence: Not on file     PHYSICAL EXAM  There were no vitals filed for this visit.  There is no height or weight on file to calculate BMI.  Generalized: Well developed, in no acute distress  Cardiology: normal rate and rhythm, no murmur noted Respiratory: clear to auscultation bilaterally  Neurological examination  Mentation: Alert oriented to time, place, history taking. Follows all commands speech and language fluent Cranial nerve II-XII: Pupils were equal round reactive to light. Extraocular movements were full, visual field were full  Motor: The motor testing reveals 5 over 5 strength of all 4 extremities. Good symmetric motor tone is noted throughout.  Gait and station: Gait is normal.    DIAGNOSTIC DATA (LABS, IMAGING, TESTING) - I reviewed patient records, labs, notes, testing and imaging myself where available.      No data to display           Lab Results  Component Value Date   WBC 8.1 06/16/2023   HGB 12.7 06/16/2023   HCT 38.6 06/16/2023   MCV 87 06/16/2023   PLT 260 06/16/2023      Component Value Date/Time   NA 146 (H) 06/16/2023 1017   K 3.5 06/16/2023 1017   CL 105 06/16/2023 1017   CO2 24 06/16/2023 1017   GLUCOSE 105  (H) 06/16/2023 1017   GLUCOSE 103 (H) 01/30/2023 1449   BUN 15 06/16/2023 1017   CREATININE 1.32 (H) 06/16/2023 1017   CALCIUM 8.9 06/16/2023 1017   PROT 6.5 06/16/2023 1017   ALBUMIN 4.1 06/16/2023 1017   AST 20 06/16/2023 1017   ALT 16 06/16/2023 1017   ALKPHOS 96 06/16/2023 1017   BILITOT 0.6 06/16/2023 1017   GFRNONAA 46 (L) 12/06/2022 0933   GFRAA 39 (L) 08/28/2019 1021   Lab Results  Component Value Date   CHOL 136 06/16/2023   HDL 54 06/16/2023   LDLCALC 68 06/16/2023   TRIG 71 06/16/2023   CHOLHDL 2.5 06/16/2023   Lab Results  Component Value Date   HGBA1C 6.1 (H) 06/16/2023   No results found for: "VITAMINB12" Lab Results  Component Value Date   TSH 1.360 07/25/2019     ASSESSMENT AND PLAN 63 y.o. year old female  has a past medical history of AKI (acute kidney injury) (HCC) (03/09/2021), Anxiety, Arthritis, Chronic kidney disease, Coronary artery disease involving native coronary artery of native heart without angina pectoris (07/04/2019), COVID-19 (04/19/2022), Depression, Diabetes mellitus without complication (HCC), Encounter for annual physical exam (05/17/2022), Essential hypertension (10/30/2018), Essential hypertension (10/30/2018), Hyperlipidemia (07/04/2019), Ischemic cardiomyopathy, Myocardial infarction (HCC), Other allergy status, other than to drugs and biological substances (11/10/2020), Palpitations, Perforation of sigmoid colon due to diverticulitis (12/23/2020), Primary localized osteoarthritis of left knee, Primary localized osteoarthritis of left knee, Recurrent major depression in remission (HCC) (11/10/2020), S/P total knee arthroplasty, right, Shortness of breath (03/09/2021), and TOA (tubo-ovarian abscess) (12/07/2021). here with   No diagnosis found.    Kathee Polite  Boeh is having difficulty adjusting to CPAP therapy. Compliance report reveals sub optimal compliance. We have reviewed sleep study results and diagnosis of sleep apnea. She wishes  to resume use for 30 days then reevaluate. She was encouraged to continue using CPAP nightly and for greater than 4 hours each night. We will update supply orders as indicated. Risks of untreated sleep apnea review and education materials provided. Healthy lifestyle habits encouraged. She will follow up in 6 months, sooner if needed. She verbalizes understanding and agreement with this plan.    No orders of the defined types were placed in this encounter.    No orders of the defined types were placed in this encounter.     Shawnie Dapper, FNP-C 11/23/2023, 10:59 AM [facility] [address], [address] [place], [place] [address] [phone]

## 2023-11-27 ENCOUNTER — Ambulatory Visit: Payer: BC Managed Care – PPO | Admitting: Family Medicine

## 2023-12-07 ENCOUNTER — Telehealth: Payer: Self-pay

## 2023-12-07 NOTE — Telephone Encounter (Signed)
 We need more information per HIPAA. I need to know who and where I am faxing medical information to. Thanks.   Copied from CRM [id number]. Topic: Medical Record Request - Provider/Facility Request >> Dec 07, 2023  1:26 PM Carlatta H wrote: Reason for CRM: Please fax most recent clinic note to [phone]

## 2023-12-25 ENCOUNTER — Encounter: Payer: Self-pay | Admitting: Nurse Practitioner

## 2023-12-25 ENCOUNTER — Ambulatory Visit: Admitting: Nurse Practitioner

## 2023-12-25 VITALS — BP 124/78 | HR 56 | Wt 171.8 lb

## 2023-12-25 DIAGNOSIS — Z79899 Other long term (current) drug therapy: Secondary | ICD-10-CM | POA: Diagnosis not present

## 2023-12-25 DIAGNOSIS — R5383 Other fatigue: Secondary | ICD-10-CM | POA: Insufficient documentation

## 2023-12-25 DIAGNOSIS — E1121 Type 2 diabetes mellitus with diabetic nephropathy: Secondary | ICD-10-CM

## 2023-12-25 DIAGNOSIS — J0140 Acute pansinusitis, unspecified: Secondary | ICD-10-CM | POA: Diagnosis not present

## 2023-12-25 DIAGNOSIS — E559 Vitamin D deficiency, unspecified: Secondary | ICD-10-CM

## 2023-12-25 DIAGNOSIS — G4733 Obstructive sleep apnea (adult) (pediatric): Secondary | ICD-10-CM | POA: Insufficient documentation

## 2023-12-25 DIAGNOSIS — E1122 Type 2 diabetes mellitus with diabetic chronic kidney disease: Secondary | ICD-10-CM

## 2023-12-25 DIAGNOSIS — N183 Chronic kidney disease, stage 3 unspecified: Secondary | ICD-10-CM

## 2023-12-25 DIAGNOSIS — N1831 Chronic kidney disease, stage 3a: Secondary | ICD-10-CM

## 2023-12-25 DIAGNOSIS — I129 Hypertensive chronic kidney disease with stage 1 through stage 4 chronic kidney disease, or unspecified chronic kidney disease: Secondary | ICD-10-CM

## 2023-12-25 HISTORY — DX: Acute pansinusitis, unspecified: J01.40

## 2023-12-25 MED ORDER — DEXCOM G7 SENSOR MISC
12 refills | Status: DC
Start: 2023-12-25 — End: 2024-06-24

## 2023-12-25 MED ORDER — LEVOCETIRIZINE DIHYDROCHLORIDE 5 MG PO TABS: 5.0000 mg | ORAL_TABLET | Freq: Every evening | ORAL | 1 refills | Status: AC

## 2023-12-25 MED ORDER — CEFUROXIME AXETIL 250 MG PO TABS: 250.0000 mg | ORAL_TABLET | Freq: Two times a day (BID) | ORAL | 0 refills | Status: AC

## 2023-12-25 NOTE — Patient Instructions (Addendum)
 I have sent in the Ceftin  for you for the infection. I have also sent xyzal (levocetirizine) for allergy symptoms. You can continue to use this after you feel better if it is helping. Keep taking the flonase , this helps best when it is used all of the time.  I sent the dexcom.  I do recommend contacting the neurology office to see about getting in to restart the CPAP. I will let you know if your labs show anything that cough be contributing to the fatigue.  I hope you feel better soon!

## 2023-12-25 NOTE — Assessment & Plan Note (Signed)
 Chronic allergic rhinitis with sneezing, congestion, and facial fullness. Fluid in the left ear. Symptoms have been progressively worsening without full resolution since March with previous antibiotic use. Previous use of Claritin , Allegra, and nasal spray without complete relief. Levocetirizine is considered for better symptom control. Flonase  nasal spray is being used and should be continued for its ongoing benefits. She also has cough with increased phlegm production at nght. She can tolerate Ceftin .  - Prescribe levocetirizine (Xyzal) to be taken at night due to sedative effects. - Continue using Flonase  nasal spray. - No success with doxycycline . Her allergies pose a great difficulty with management options. Will trial Ceftin  as she reports this has been tolerated in the past.

## 2023-12-25 NOTE — Progress Notes (Signed)
 Andrea Kief, DNP, AGNP-c [facility] [address] [place], [place] [address] [phone]   ACUTE VISIT- ESTABLISHED PATIENT  Blood pressure 124/78, pulse (!) 56, weight 171 lb 12.8 oz (77.9 kg).  Subjective:  HPI Andrea Robertson is a 63 y.o. female presents to day for evaluation of acute concern(s).     ROS negative except for what is listed in HPI. History, Medications, Surgery, SDOH, and Family History reviewed and updated as appropriate.  Objective:  Physical Exam Vitals and nursing note reviewed.  Constitutional:      General: She is not in acute distress.    Appearance: Normal appearance.  HENT:     Head: Normocephalic.     Right Ear: A middle ear effusion is present.     Left Ear: A middle ear effusion is present.     Nose: Congestion present.     Mouth/Throat:     Mouth: Mucous membranes are moist.     Pharynx: Oropharynx is clear.  Eyes:     Conjunctiva/sclera: Conjunctivae normal.  Neck:     Vascular: No carotid bruit.  Cardiovascular:     Rate and Rhythm: Normal rate.     Pulses: Normal pulses.  Pulmonary:     Effort: Pulmonary effort is normal.     Breath sounds: Normal breath sounds. No rhonchi.  Abdominal:     Palpations: Abdomen is soft.  Lymphadenopathy:     Cervical: No cervical adenopathy.  Skin:    General: Skin is warm and dry.     Capillary Refill: Capillary refill takes less than 2 seconds.  Neurological:     General: No focal deficit present.     Mental Status: She is alert and oriented to person, place, and time.  Psychiatric:        Mood and Affect: Mood normal.        Behavior: Behavior normal.         Assessment & Plan:   Problem List Items Addressed This Visit     Type 2 diabetes mellitus with stage 3a chronic kidney disease, without long-term current use of insulin  (HCC)   She would like to restart on Dexcom for improved monitoring of her blood sugar levels and diabetes control. Will send order.        Relevant Medications   Continuous Glucose Sensor (DEXCOM G7 SENSOR) MISC   Other Relevant Orders   Hemoglobin A1c   Vitamin B12   CBC with Differential/Platelet   Comprehensive metabolic panel with GFR   Iron, TIBC and Ferritin Panel   VITAMIN D  25 Hydroxy (Vit-D Deficiency, Fractures)   Acute non-recurrent pansinusitis - Primary   Chronic allergic rhinitis with sneezing, congestion, and facial fullness. Fluid in the left ear. Symptoms have been progressively worsening without full resolution since March with previous antibiotic use. Previous use of Claritin , Allegra, and nasal spray without complete relief. Levocetirizine is considered for better symptom control. Flonase  nasal spray is being used and should be continued for its ongoing benefits. She also has cough with increased phlegm production at nght. She can tolerate Ceftin .  - Prescribe levocetirizine (Xyzal) to be taken at night due to sedative effects. - Continue using Flonase  nasal spray. - No success with doxycycline . Her allergies pose a great difficulty with management options. Will trial Ceftin  as she reports this has been tolerated in the past.        Relevant Medications   cefUROXime  (CEFTIN ) 250 MG tablet   levocetirizine (XYZAL) 5 MG tablet  Fatigue   Persistent fatigue over the last 2-3 months, possibly related to sleep apnea. Consideration of other causes such as low iron or B12 deficiency. She reports increased daytime sleepiness and lethargy, impacting daily activities. - Order labs to check for chronic concerns as well as iron levels and B12 deficiency.      Relevant Orders   Vitamin B12   Iron, TIBC and Ferritin Panel   VITAMIN D  25 Hydroxy (Vit-D Deficiency, Fractures)   TSH   OSA (obstructive sleep apnea)   Sleep apnea with previous unsuccessful CPAP use. Increased lethargy and fatigue over the past 2-3 months, possibly related to untreated sleep apnea. She expressed interest in retrying CPAP therapy  due to significant fatigue and daytime sleepiness. - Contact neurology office to explore restarting CPAP therapy.      Hypertension associated with stage 3 chronic kidney disease due to type 2 diabetes mellitus (HCC)   Diabetic renal disease (HCC)   Relevant Medications   Continuous Glucose Sensor (DEXCOM G7 SENSOR) MISC   Other Relevant Orders   Hemoglobin A1c   Vitamin B12   Other Visit Diagnoses       Vitamin D  deficiency       Relevant Orders   Comprehensive metabolic panel with GFR   VITAMIN D  25 Hydroxy (Vit-D Deficiency, Fractures)     High risk medication use       Relevant Orders   Hemoglobin A1c   Vitamin B12   CBC with Differential/Platelet   Comprehensive metabolic panel with GFR   Iron, TIBC and Ferritin Panel   VITAMIN D  25 Hydroxy (Vit-D Deficiency, Fractures)   TSH         Andrea Kief, DNP, AGNP-c

## 2023-12-25 NOTE — Assessment & Plan Note (Signed)
 Sleep apnea with previous unsuccessful CPAP use. Increased lethargy and fatigue over the past 2-3 months, possibly related to untreated sleep apnea. She expressed interest in retrying CPAP therapy due to significant fatigue and daytime sleepiness. - Contact neurology office to explore restarting CPAP therapy.

## 2023-12-25 NOTE — Assessment & Plan Note (Signed)
 Persistent fatigue over the last 2-3 months, possibly related to sleep apnea. Consideration of other causes such as low iron or B12 deficiency. She reports increased daytime sleepiness and lethargy, impacting daily activities. - Order labs to check for chronic concerns as well as iron levels and B12 deficiency.

## 2023-12-25 NOTE — Assessment & Plan Note (Signed)
 She would like to restart on Dexcom for improved monitoring of her blood sugar levels and diabetes control. Will send order.

## 2023-12-26 LAB — COMPREHENSIVE METABOLIC PANEL WITH GFR
ALT: 18 IU/L (ref 0–32)
AST: 25 IU/L (ref 0–40)
Albumin: 4.3 g/dL (ref 3.8–4.9)
Alkaline Phosphatase: 110 IU/L (ref 44–121)
BUN/Creatinine Ratio: 15 (ref 12–28)
BUN: 19 mg/dL (ref 8–27)
Bilirubin Total: 0.4 mg/dL (ref 0.0–1.2)
CO2: 21 mmol/L (ref 20–29)
Calcium: 8.9 mg/dL (ref 8.7–10.3)
Chloride: 104 mmol/L (ref 96–106)
Creatinine, Ser: 1.24 mg/dL — ABNORMAL HIGH (ref 0.57–1.00)
Globulin, Total: 2.5 g/dL (ref 1.5–4.5)
Glucose: 94 mg/dL (ref 70–99)
Potassium: 3.7 mmol/L (ref 3.5–5.2)
Sodium: 142 mmol/L (ref 134–144)
Total Protein: 6.8 g/dL (ref 6.0–8.5)
eGFR: 50 mL/min/{1.73_m2} — ABNORMAL LOW (ref >59)

## 2023-12-26 LAB — VITAMIN D 25 HYDROXY (VIT D DEFICIENCY, FRACTURES): Vit D, 25-Hydroxy: 45.2 ng/mL (ref 30.0–100.0)

## 2023-12-26 LAB — CBC WITH DIFFERENTIAL/PLATELET
Basophils Absolute: 0.1 x10E3/uL (ref 0.0–0.2)
Basos: 1 %
EOS (ABSOLUTE): 0.1 x10E3/uL (ref 0.0–0.4)
Eos: 2 %
Hematocrit: 39.6 % (ref 34.0–46.6)
Hemoglobin: 12.8 g/dL (ref 11.1–15.9)
Immature Grans (Abs): 0 x10E3/uL (ref 0.0–0.1)
Immature Granulocytes: 0 %
Lymphocytes Absolute: 2.1 x10E3/uL (ref 0.7–3.1)
Lymphs: 24 %
MCH: 28.6 pg (ref 26.6–33.0)
MCHC: 32.3 g/dL (ref 31.5–35.7)
MCV: 89 fL (ref 79–97)
Monocytes Absolute: 0.6 x10E3/uL (ref 0.1–0.9)
Monocytes: 7 %
Neutrophils Absolute: 5.9 x10E3/uL (ref 1.4–7.0)
Neutrophils: 66 %
Platelets: 251 x10E3/uL (ref 150–450)
RBC: 4.47 x10E6/uL (ref 3.77–5.28)
RDW: 14.6 % (ref 11.7–15.4)
WBC: 8.9 x10E3/uL (ref 3.4–10.8)

## 2023-12-26 LAB — IRON,TIBC AND FERRITIN PANEL
Ferritin: 323 ng/mL — ABNORMAL HIGH (ref 15–150)
Iron Saturation: 16 % (ref 15–55)
Iron: 42 ug/dL (ref 27–159)
Total Iron Binding Capacity: 264 ug/dL (ref 250–450)
UIBC: 222 ug/dL (ref 131–425)

## 2023-12-26 LAB — VITAMIN B12: Vitamin B-12: 946 pg/mL (ref 232–1245)

## 2023-12-26 LAB — HEMOGLOBIN A1C
Est. average glucose Bld gHb Est-mCnc: 120 mg/dL
Hgb A1c MFr Bld: 5.8 % — ABNORMAL HIGH (ref 4.8–5.6)

## 2023-12-26 LAB — TSH: TSH: 1.56 u[IU]/mL (ref 0.450–4.500)

## 2023-12-28 ENCOUNTER — Encounter: Payer: Self-pay | Admitting: Nurse Practitioner

## 2024-01-11 IMAGING — MR MR PELVIS WO/W CM
20 of 23 series · 43 of 48 positions shown · IV contrast (gadavist)
Comparison: CT scan, 12/06/2021

CLINICAL DATA: Evaluate cystic pelvic lesions seen on recent CT
scan.

EXAM:
MRI PELVIS WITHOUT AND WITH CONTRAST
TECHNIQUE: Multiplanar multisequence MR imaging of the pelvis was performed
both before and after administration of intravenous contrast.
CONTRAST:  7mL GADAVIST GADOBUTROL 1 MMOL/ML IV SOLN

[Series 2: T2 · coronal · 5.0mm · 1.48mm/px · 3 of 44 slices shown (1 of 6)]
[im 1/44]
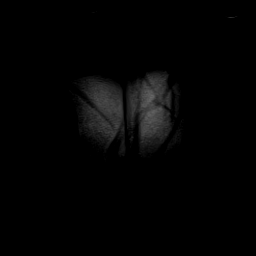
[im 22/44]
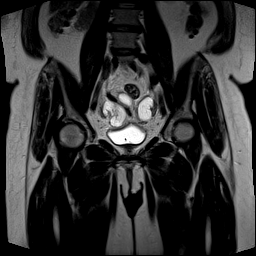
[im 44/44]
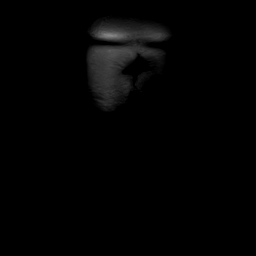

[Series 3: T2 · coronal · 5.0mm · 1.48mm/px · 2 of 44 slices shown (2 of 6)]
[im 1/44]
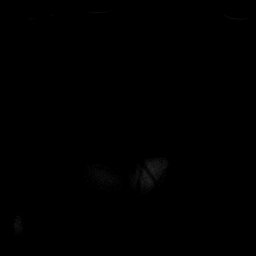
[im 44/44]
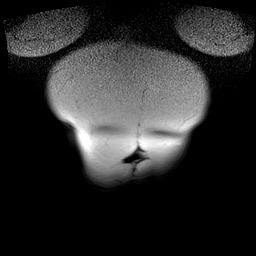

[Series 4: T2 · axial · 4.5mm · 0.70mm/px · 1 of 36 slices shown (3 of 6)]
[im 1/36]
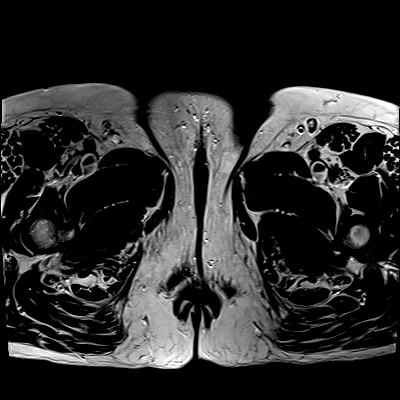

[Series 5: T2 fat-sat · axial · 4.5mm · 0.70mm/px · 1 of 36 slices shown]
[im 1/36]
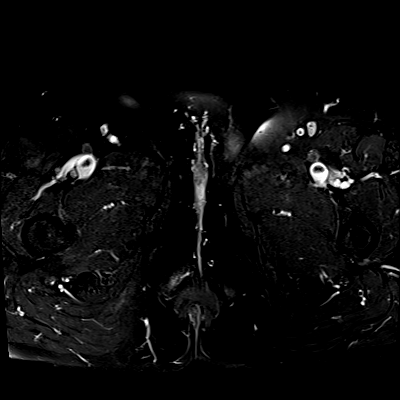

[Series 6: T2 · sagittal · 4.5mm · 0.70mm/px · 2 of 46 slices shown (4 of 6)]
[im 1/46]
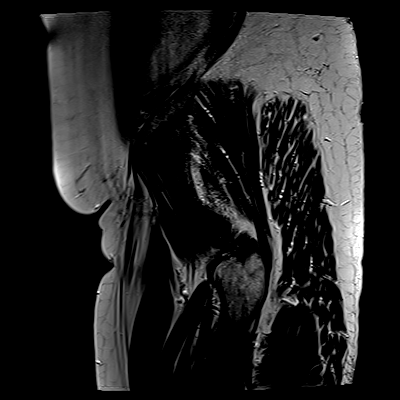
[im 46/46]
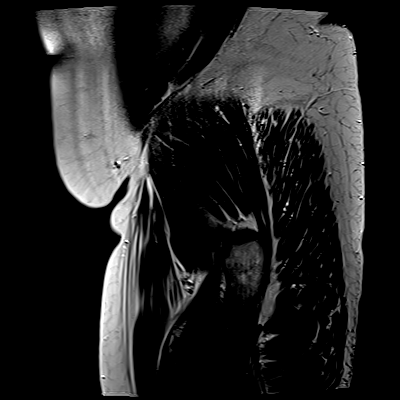

[Series 8: T2 · sagittal · 4.5mm · 0.70mm/px · 2 of 46 slices shown (5 of 6)]
[im 1/46]
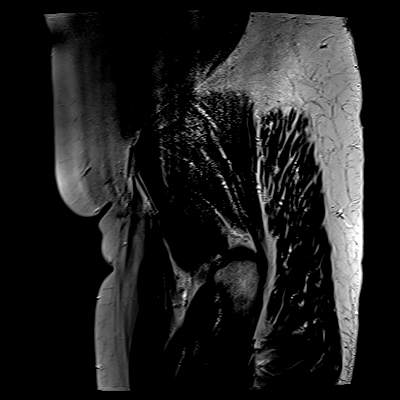
[im 46/46]
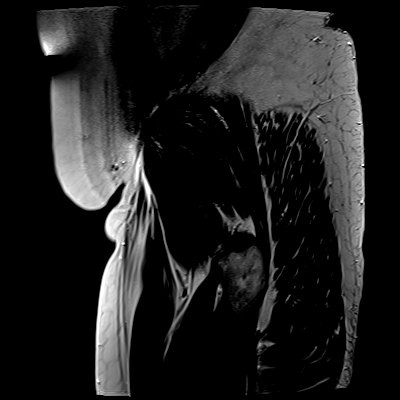

[Series 9: DWI · axial · 6.0mm · 1.27mm/px · z∈[-86,+151]mm · 4 of 102 slices shown (1 of 2)]
[im 1/102]
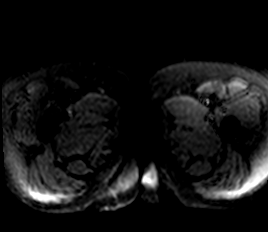
[im 34/102]
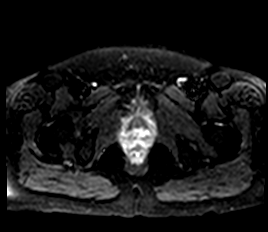
[im 68/102]
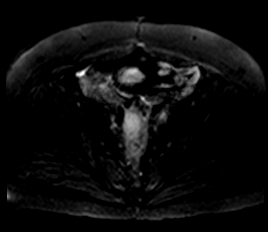
[im 102/102]
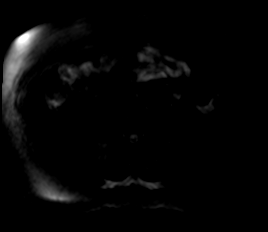

[Series 10: DWI · axial · 6.0mm · 1.27mm/px · 1 of 34 slices shown (2 of 2)]
[im 1/34]
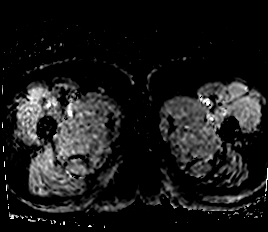

[Series 11: ax in + · axial · 6.0mm · 0.88mm/px · 1 of 34 slices shown (1 of 4)]
[im 1/34]
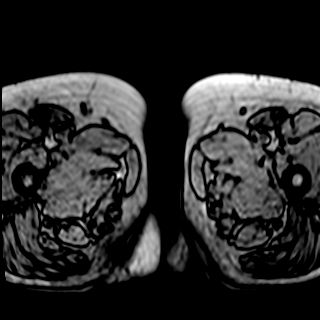

[Series 11: ax in + · axial · 6.0mm · 0.88mm/px · 1 of 34 slices shown (2 of 4)]
[im 1/34]
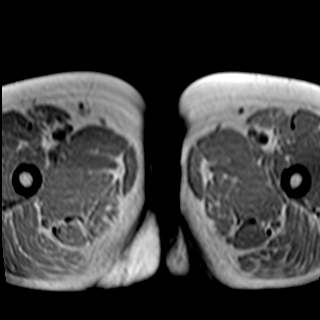

[Series 12: T2 · coronal · 4.5mm · 0.70mm/px · 2 of 44 slices shown (6 of 6)]
[im 1/44]
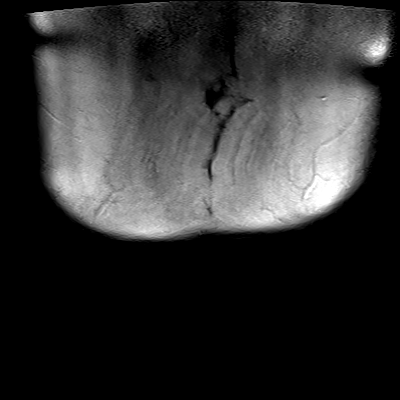
[im 44/44]
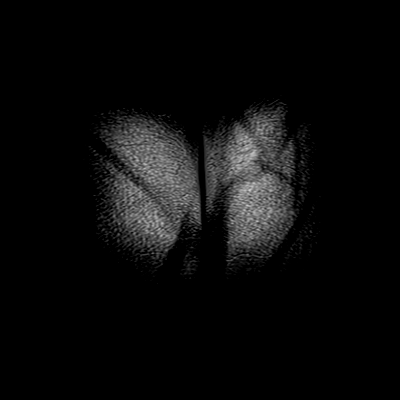

[Series 13: ax in + · axial · 6.0mm · 0.88mm/px · 1 of 34 slices shown (3 of 4)]
[im 1/34]
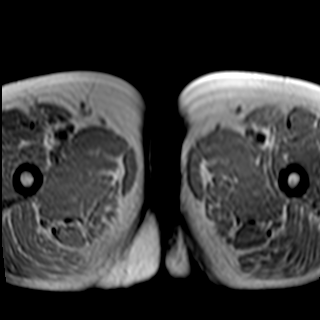

[Series 13: ax in + · axial · 6.0mm · 0.88mm/px · 1 of 34 slices shown (4 of 4)]
[im 1/34]
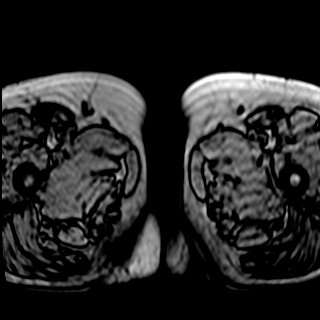

[Series 14: t1_vibe_fs_tra_p4_bh_pre · axial · 3.0mm · 0.94mm/px · z∈[-95,+166]mm · 3 of 88 slices shown]
[im 1/88]
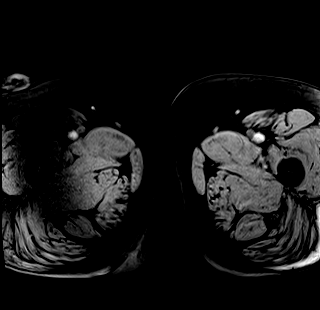
[im 44/88]
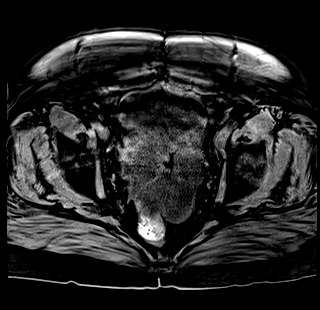
[im 88/88]
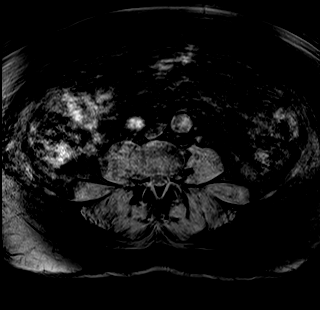

[Series 15: t1_vibe_fs_tra_p4_bh_post · axial · 3.0mm · 0.94mm/px · z∈[-95,+166]mm · 3 of 88 slices shown (1 of 3)]
[im 1/88]
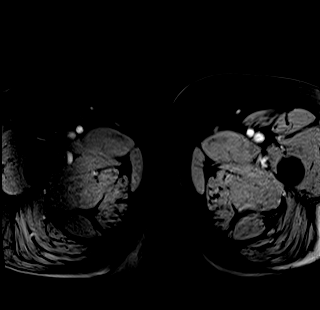
[im 44/88]
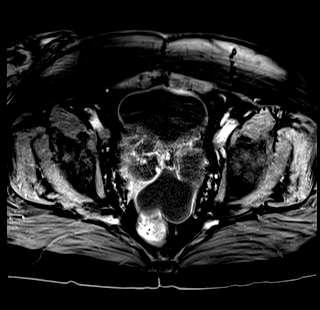
[im 88/88]
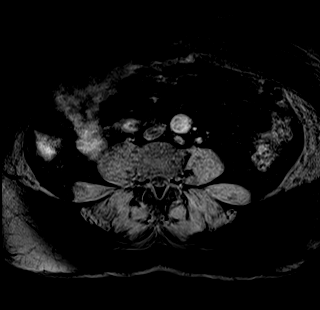

[Series 16: t1_vibe_fs_tra_p4_bh_post_sub · axial · 3.0mm · 0.94mm/px · z∈[-95,+166]mm · 3 of 88 slices shown (1 of 3)]
[im 1/88]
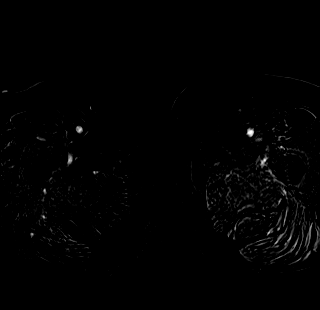
[im 44/88]
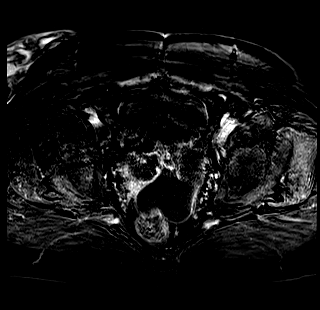
[im 88/88]
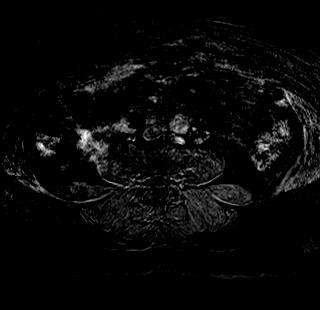

[Series 17: t1_vibe_fs_tra_p4_bh_post · axial · 3.0mm · 0.94mm/px · z∈[-95,+166]mm · 3 of 88 slices shown (2 of 3)]
[im 1/88]
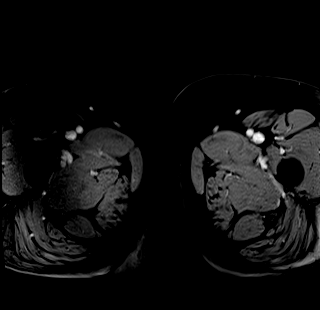
[im 44/88]
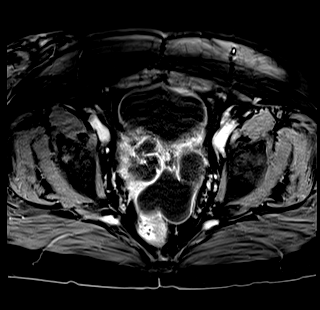
[im 88/88]
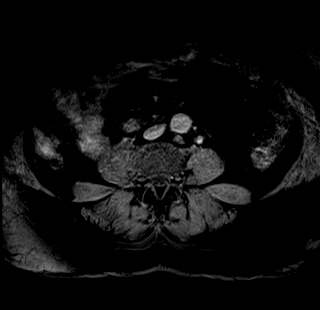

[Series 18: t1_vibe_fs_tra_p4_bh_post_sub · axial · 3.0mm · 0.94mm/px · z∈[-95,+166]mm · 3 of 88 slices shown (2 of 3)]
[im 1/88]
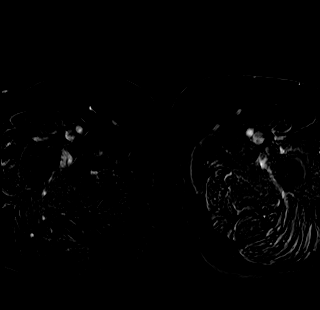
[im 44/88]
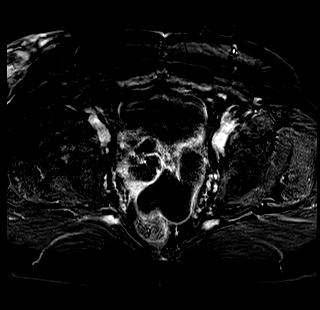
[im 88/88]
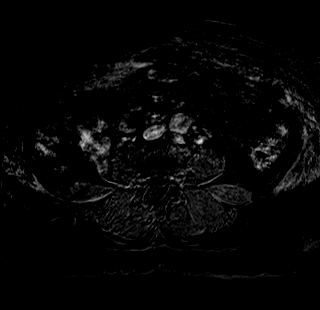

[Series 19: t1_vibe_fs_tra_p4_bh_post · axial · 3.0mm · 0.94mm/px · z∈[-95,+166]mm · 3 of 88 slices shown (3 of 3)]
[im 1/88]
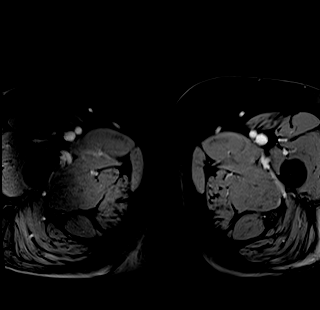
[im 44/88]
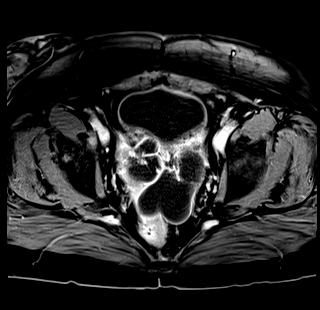
[im 88/88]
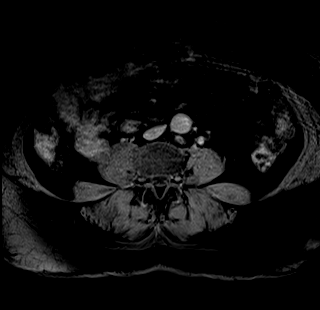

[Series 20: t1_vibe_fs_tra_p4_bh_post_sub · axial · 3.0mm · 0.94mm/px · z∈[-95,+166]mm · 3 of 88 slices shown (3 of 3)]
[im 1/88]
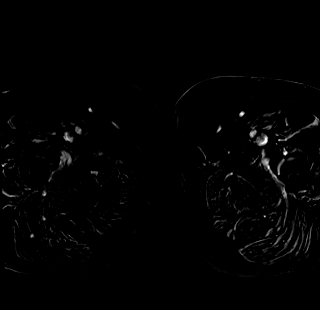
[im 44/88]
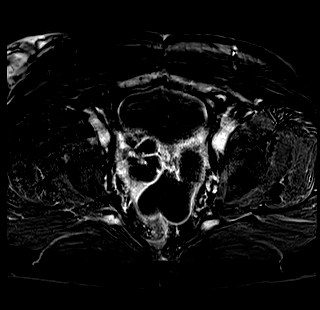
[im 88/88]
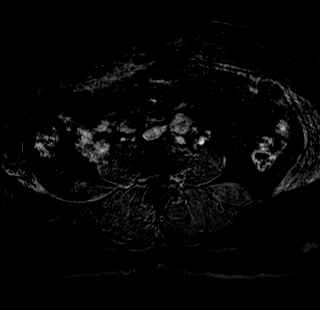

[43 of 48 positions shown; findings below may reference images not displayed]

FINDINGS: Urinary Tract: The bladder is unremarkable. No bladder mass, calculi
or asymmetric wall thickening.

Bowel: The rectum, sigmoid colon and visualized small bowel loops
are unremarkable. Cecum and appendix are normal. Moderate
diverticulosis without findings for acute diverticulitis.

Vascular/Lymphatic: The major vascular structures are normal. No
pelvic lymphadenopathy.

Reproductive: The uterus is surgically absent. Both ovaries are
still present. They appeared normal on a prior CT scan from 2261.

As demonstrated on the CT scan and there is a complex multi septated
appearing cystic lesion in the pelvis. Rim enhancement is
demonstrated as was seen on the CT scan this appears to be
associated with both adnexa and I think this is most likely a
complex bilateral hydrosalpinx (if the patient had a hysterectomy
remotely and the fallopian tubes were not removed). There are areas
of normal appearing ovarian tissue bilaterally. There are no
inflammatory changes around this area to suggest this is an abscess.
No associated free fluid. Cystic ovarian neoplasm is also a
consideration.

Other:  No free pelvic fluid collections or inguinal adenopathy.

Musculoskeletal: The bony structures are unremarkable. There is a
focal central disc protrusion noted at L5-S1. The hip and pelvic
musculature are unremarkable.
IMPRESSION: 1. Complex multi-septated appearing cystic lesion in the pelvis is
most likely complex bilateral hydrosalpinx. Recommend correlation
with date of prior hysterectomy and whether or not the fallopian
tubes were removed. Both ovaries are still present. Cystic ovarian
neoplasm is also a consideration. I think it is unlikely this is an
abscess.
2. No pelvic lymphadenopathy.
3. Small focal central disc protrusion at L5-S1.

## 2024-01-24 DIAGNOSIS — R232 Flushing: Secondary | ICD-10-CM | POA: Diagnosis not present

## 2024-01-24 DIAGNOSIS — R5383 Other fatigue: Secondary | ICD-10-CM | POA: Diagnosis not present

## 2024-01-24 DIAGNOSIS — Z1231 Encounter for screening mammogram for malignant neoplasm of breast: Secondary | ICD-10-CM | POA: Diagnosis not present

## 2024-01-24 DIAGNOSIS — Z6832 Body mass index (BMI) 32.0-32.9, adult: Secondary | ICD-10-CM | POA: Diagnosis not present

## 2024-01-24 DIAGNOSIS — Z01419 Encounter for gynecological examination (general) (routine) without abnormal findings: Secondary | ICD-10-CM | POA: Diagnosis not present

## 2024-01-27 ENCOUNTER — Other Ambulatory Visit: Payer: Self-pay | Admitting: Nurse Practitioner

## 2024-01-27 DIAGNOSIS — I5042 Chronic combined systolic (congestive) and diastolic (congestive) heart failure: Secondary | ICD-10-CM

## 2024-01-27 DIAGNOSIS — N1831 Chronic kidney disease, stage 3a: Secondary | ICD-10-CM

## 2024-01-27 DIAGNOSIS — I25118 Atherosclerotic heart disease of native coronary artery with other forms of angina pectoris: Secondary | ICD-10-CM

## 2024-01-27 DIAGNOSIS — E1121 Type 2 diabetes mellitus with diabetic nephropathy: Secondary | ICD-10-CM

## 2024-01-27 DIAGNOSIS — I428 Other cardiomyopathies: Secondary | ICD-10-CM

## 2024-01-27 DIAGNOSIS — E1122 Type 2 diabetes mellitus with diabetic chronic kidney disease: Secondary | ICD-10-CM

## 2024-01-27 DIAGNOSIS — E1169 Type 2 diabetes mellitus with other specified complication: Secondary | ICD-10-CM

## 2024-02-08 DIAGNOSIS — I5042 Chronic combined systolic (congestive) and diastolic (congestive) heart failure: Secondary | ICD-10-CM | POA: Diagnosis not present

## 2024-02-08 DIAGNOSIS — E1122 Type 2 diabetes mellitus with diabetic chronic kidney disease: Secondary | ICD-10-CM | POA: Diagnosis not present

## 2024-02-08 DIAGNOSIS — N1831 Chronic kidney disease, stage 3a: Secondary | ICD-10-CM | POA: Diagnosis not present

## 2024-02-08 DIAGNOSIS — I129 Hypertensive chronic kidney disease with stage 1 through stage 4 chronic kidney disease, or unspecified chronic kidney disease: Secondary | ICD-10-CM | POA: Diagnosis not present

## 2024-02-09 LAB — LAB REPORT - SCANNED
Albumin, Urine POC: 147.2
Creatinine, POC: 291.7 mg/dL
Microalb Creat Ratio: 50

## 2024-04-11 ENCOUNTER — Ambulatory Visit: Payer: Self-pay | Admitting: Cardiology

## 2024-06-11 ENCOUNTER — Telehealth: Payer: Self-pay

## 2024-06-11 NOTE — Patient Instructions (Incomplete)

## 2024-06-11 NOTE — Progress Notes (Deleted)
 PATIENT: Andrea Robertson DOB: 1961-05-23  REASON FOR VISIT: follow up HISTORY FROM: patient  No chief complaint on file.    HISTORY OF PRESENT ILLNESS:  06/11/24 ALL:  Andrea Robertson returns for follow up for OSA on CPAP. She was last seen 04/2023 and having difficulty tolerating therapy. She wished to therapy. Since,   05/03/2023 ALL:  Andrea Robertson is a 63 y.o. female here today for follow up for OSA on CPAP. She was seen in consult with Dr Buck 09/2022 for concerns of snoring, witnessed apneic events and excessive daytime sleepiness. PSG showed mild obstructive sleep apnea, more pronounced during supine sleep with a total AHI of 10/h, REM AHI of 7.2/h, supine AHI of 96.9/h, O2 nadir 87%. AutoPAP advised. Since, she reports not doing well on therapy. She has a hard time breathing with mask. She doesn't like her current mask but was told she can not switch until October. She has not noted any significant difference in sleep quality or daytime energy levels with therapy. She sleeps well. Denies EDS. She was recently seen by cardiology and reports CHF/BP are well managed.     HISTORY: (copied from Dr Obie previous note)  Dear Camie,   I saw your patient, Andrea Robertson, upon your kind request in my sleep clinic today for initial consultation of her sleep disorder, in particular, concern for underlying obstructive sleep apnea.  The patient is unaccompanied today.  As you know, Andrea Robertson is a 62 year old female with an underlying complex medical history of diabetes, hypertension, chronic kidney disease, hyperlipidemia, nonischemic cardiomyopathy, coronary artery disease with history of MI, status post stent placement, sickle cell trait, depression, anxiety, reflux disease, allergies, palpitations, and obesity, who reports snoring and excessive daytime somnolence as well as witnessed apneas per husband's report.  Her Epworth sleepiness score is 4 out of 24, fatigue severity score is 14  out of 63.  I reviewed your office note from 08/30/2022.  She had a sleep study several years ago, approximately 15 or 20 years ago which was negative for sleep apnea at the time.  Prior sleep study results are not available for my review today.  She lives with her husband, she has 2 grown children.  She has a dog in the household but the dog is primarily outside.  Bedtime is generally between 8:30 PM and 9 PM but lately it has been closer to 11 or 11:30 PM as she is in school for her business management degree.  She works full-time as an Biomedical engineer.  Rise time is generally around 6:30 AM to 7 AM.  She has no recurrent morning headaches, no night to night nocturia.  Her sister has sleep apnea and has a CPAP machine.  Patient cut back on caffeine  intake and limits her caffeine  to 1 cup of coffee per day generally.  She drinks alcohol rarely, she is a non-smoker.  She does have a TV in her bedroom but it is typically not on at night.  She is a side sleeper and stomach sleeper, cannot sleep on her back especially flat on her back.   REVIEW OF SYSTEMS: Out of a complete 14 system review of symptoms, the patient complains only of the following symptoms, none and all other reviewed systems are negative.  ESS: 2/24, previously 4/24  ALLERGIES: Allergies  Allergen Reactions   Dilaudid  [Hydromorphone  Hcl] Hives    Can tolerate if premedicated with benadryl    Erythromycin Hives and Nausea And Vomiting   Hydromorphone   Hives    hives hives    Can tolerate if premedicated with benadryl    Percocet [Oxycodone -Acetaminophen ] Itching and Nausea And Vomiting    Can tolerate with premedication of diphenhydramine  and/or nausea medication   Zithromax [Azithromycin] Nausea And Vomiting   Penicillins Hives, Nausea And Vomiting and Rash   Codeine Nausea Only   Metformin Diarrhea   Oxycodone  Other (See Comments)    Other reaction(s): nausea, epigastric pain   Cefprozil Rash    rash but takes Ceftin    Other  Rash and Other (See Comments)    Gel on Ekg pads    HOME MEDICATIONS: Outpatient Medications Prior to Visit  Medication Sig Dispense Refill   amLODipine  (NORVASC ) 10 MG tablet Take 1 tablet (10 mg total) by mouth daily. 90 tablet 3   aspirin  EC 81 MG EC tablet Take 1 tablet (81 mg total) by mouth daily. (Patient taking differently: Take 81 mg by mouth every morning.) 90 tablet 3   atorvastatin  (LIPITOR ) 80 MG tablet Take 1 tablet (80 mg total) by mouth daily. 90 tablet 3   buPROPion  (WELLBUTRIN  XL) 300 MG 24 hr tablet Take 1 tablet (300 mg total) by mouth daily. 90 tablet 3   carvedilol  (COREG ) 25 MG tablet TAKE 1&1/2 TABLETS BY MOUTH TWICE A DAY 270 tablet 3   Continuous Glucose Sensor (DEXCOM G7 SENSOR) MISC Apply new sensor every 10 days as directed. 3 each 12   dapagliflozin  propanediol (FARXIGA ) 10 MG TABS tablet Take 1 tablet (10 mg total) by mouth daily before breakfast. 90 tablet 3   ENTRESTO  49-51 MG TAKE 1 TABLET BY MOUTH TWICE A DAY 180 tablet 2   escitalopram  (LEXAPRO ) 20 MG tablet Take 1 tablet (20 mg total) by mouth every morning. 90 tablet 3   ezetimibe  (ZETIA ) 10 MG tablet Take 1 tablet (10 mg total) by mouth daily. 90 tablet 3   fluticasone  (FLONASE ) 50 MCG/ACT nasal spray Place 2 sprays into both nostrils as needed.     levocetirizine (XYZAL ) 5 MG tablet Take 1 tablet (5 mg total) by mouth every evening. 90 tablet 1   Meclizine  HCl 25 MG CHEW Chew by mouth as needed.     Multiple Vitamin (MULTIVITAMIN WITH MINERALS) TABS tablet Take 1 tablet by mouth every morning.     nitroGLYCERIN  (NITROSTAT ) 0.4 MG SL tablet Place 1 tablet (0.4 mg total) under the tongue every 5 (five) minutes x 3 doses as needed for chest pain. (Patient not taking: Reported on 12/25/2023) 25 tablet 1   Semaglutide , 2 MG/DOSE, (OZEMPIC , 2 MG/DOSE,) 8 MG/3ML SOPN INJECT 2 MG AS DIRECTED ONCE A WEEK. 9 mL 3   No facility-administered medications prior to visit.    PAST MEDICAL HISTORY: Past Medical  History:  Diagnosis Date   AKI (acute kidney injury) 03/09/2021   Anxiety    Arthritis    Chronic kidney disease    Coronary artery disease involving native coronary artery of native heart without angina pectoris 07/04/2019   COVID-19 04/19/2022   Depression    Diabetes mellitus without complication (HCC)    Encounter for annual physical exam 05/17/2022   Essential hypertension 10/30/2018   Essential hypertension 10/30/2018   Hyperlipidemia 07/04/2019   Ischemic cardiomyopathy    Myocardial infarction [facility])    Other allergy status, other than to drugs and biological substances 11/10/2020   Palpitations    Perforation of sigmoid colon due to diverticulitis 12/23/2020   Primary localized osteoarthritis of left knee    Primary localized  osteoarthritis of left knee    Recurrent major depression in remission 11/10/2020   S/P total knee arthroplasty, right    Shortness of breath 03/09/2021   TOA (tubo-ovarian abscess) 12/07/2021    PAST SURGICAL HISTORY: Past Surgical History:  Procedure Laterality Date   ABDOMINAL HYSTERECTOMY     CHOLECYSTECTOMY     CORONARY STENT INTERVENTION N/A 07/03/2019   Procedure: CORONARY STENT INTERVENTION;  Surgeon: Wonda Sharper, MD;  Location: [facility] INVASIVE CV LAB;  Service: Cardiovascular;  Laterality: N/A;   LEFT HEART CATH AND CORONARY ANGIOGRAPHY N/A 07/03/2019   Procedure: LEFT HEART CATH AND CORONARY ANGIOGRAPHY;  Surgeon: Wonda Sharper, MD;  Location: [facility] INVASIVE CV LAB;  Service: Cardiovascular;  Laterality: N/A;   RIGHT/LEFT HEART CATH AND CORONARY ANGIOGRAPHY N/A 07/27/2021   Procedure: RIGHT/LEFT HEART CATH AND CORONARY ANGIOGRAPHY;  Surgeon: Ladona Heinz, MD;  Location: [facility] INVASIVE CV LAB;  Service: Cardiovascular;  Laterality: N/A;   SHOULDER ARTHROSCOPY WITH SUBACROMIAL DECOMPRESSION, ROTATOR CUFF REPAIR AND BICEP TENDON REPAIR Left 12/08/2022   Procedure: SHOULDER ARTHROSCOPY WITH SUBACROMIAL DECOMPRESSION, ROTATOR CUFF REPAIR AND BICEP  TENDON REPAIR;  Surgeon: Cristy Bonner DASEN, MD;  Location: [facility];  Service: Orthopedics;  Laterality: Left;   TOTAL KNEE ARTHROPLASTY Right 10/13/2015   Procedure: RIGHT TOTAL KNEE ARTHROPLASTY;  Surgeon: Tanda Heading, MD;  Location: [facility];  Service: Orthopedics;  Laterality: Right;   TOTAL KNEE ARTHROPLASTY Left 11/23/2020   Procedure: TOTAL KNEE ARTHROPLASTY;  Surgeon: Jane Charleston, MD;  Location: [facility];  Service: Orthopedics;  Laterality: Left;    FAMILY HISTORY: Family History  Problem Relation Age of Onset   Heart disease Mother    Multiple myeloma Mother    Heart attack Father 41   CAD Father    Hypertension Sister    Hypertension Sister    Hypertension Sister    Heart failure Brother    Sleep apnea Neg Hx     SOCIAL HISTORY: Social History   Socioeconomic History   Marital status: Married    Spouse name: Not on file   Number of children: 2   Years of education: Not on file   Highest education level: Not on file  Occupational History   Not on file  Tobacco Use   Smoking status: Never   Smokeless tobacco: Never  Vaping Use   Vaping status: Never Used  Substance and Sexual Activity   Alcohol use: Yes    Comment: occ   Drug use: No   Sexual activity: Yes    Birth control/protection: Surgical    Comment: Hyst  Other Topics Concern   Not on file  Social History Narrative   Not on file   Social Drivers of Health   Financial Resource Strain: Not on file  Food Insecurity: No Food Insecurity (10/08/2019)   Hunger Vital Sign    Worried About Running Out of Food in the Last Year: Never true    Ran Out of Food in the Last Year: Never true  Transportation Needs: No Transportation Needs (10/08/2019)   PRAPARE - Administrator, Civil Service (Medical): No    Lack of Transportation (Non-Medical): No  Physical Activity: Not on file  Stress: No Stress Concern Present (10/08/2019)   [employer] of Occupational Health - Occupational  Stress Questionnaire    Feeling of Stress : Only a little  Social Connections: Socially Integrated (10/08/2019)   Social Connection and Isolation Panel    Frequency of Communication with Friends and Family:  More than three times a week    Frequency of Social Gatherings with Friends and Family: Three times a week    Attends Religious Services: More than 4 times per year    Active Member of Clubs or Organizations: No    Attends Engineer, structural: More than 4 times per year    Marital Status: Married  Catering manager Violence: Not on file     PHYSICAL EXAM  There were no vitals filed for this visit.  There is no height or weight on file to calculate BMI.  Generalized: Well developed, in no acute distress  Cardiology: normal rate and rhythm, no murmur noted Respiratory: clear to auscultation bilaterally  Neurological examination  Mentation: Alert oriented to time, place, history taking. Follows all commands speech and language fluent Cranial nerve II-XII: Pupils were equal round reactive to light. Extraocular movements were full, visual field were full  Motor: The motor testing reveals 5 over 5 strength of all 4 extremities. Good symmetric motor tone is noted throughout.  Gait and station: Gait is normal.    DIAGNOSTIC DATA (LABS, IMAGING, TESTING) - I reviewed patient records, labs, notes, testing and imaging myself where available.      No data to display           Lab Results  Component Value Date   WBC 8.9 12/25/2023   HGB 12.8 12/25/2023   HCT 39.6 12/25/2023   MCV 89 12/25/2023   PLT 251 12/25/2023      Component Value Date/Time   NA 142 12/25/2023 1035   K 3.7 12/25/2023 1035   CL 104 12/25/2023 1035   CO2 21 12/25/2023 1035   GLUCOSE 94 12/25/2023 1035   GLUCOSE 103 (H) 01/30/2023 1449   BUN 19 12/25/2023 1035   CREATININE 1.24 (H) 12/25/2023 1035   CALCIUM  8.9 12/25/2023 1035   PROT 6.8 12/25/2023 1035   ALBUMIN 4.3 12/25/2023 1035   AST  25 12/25/2023 1035   ALT 18 12/25/2023 1035   ALKPHOS 110 12/25/2023 1035   BILITOT 0.4 12/25/2023 1035   GFRNONAA 46 (L) 12/06/2022 0933   GFRAA 39 (L) 08/28/2019 1021   Lab Results  Component Value Date   CHOL 136 06/16/2023   HDL 54 06/16/2023   LDLCALC 68 06/16/2023   TRIG 71 06/16/2023   CHOLHDL 2.5 06/16/2023   Lab Results  Component Value Date   HGBA1C 5.8 (H) 12/25/2023   Lab Results  Component Value Date   VITAMINB12 946 12/25/2023   Lab Results  Component Value Date   TSH 1.560 12/25/2023     ASSESSMENT AND PLAN 63 y.o. year old female  has a past medical history of AKI (acute kidney injury) (03/09/2021), Anxiety, Arthritis, Chronic kidney disease, Coronary artery disease involving native coronary artery of native heart without angina pectoris (07/04/2019), COVID-19 (04/19/2022), Depression, Diabetes mellitus without complication (HCC), Encounter for annual physical exam (05/17/2022), Essential hypertension (10/30/2018), Essential hypertension (10/30/2018), Hyperlipidemia (07/04/2019), Ischemic cardiomyopathy, Myocardial infarction (HCC), Other allergy status, other than to drugs and biological substances (11/10/2020), Palpitations, Perforation of sigmoid colon due to diverticulitis (12/23/2020), Primary localized osteoarthritis of left knee, Primary localized osteoarthritis of left knee, Recurrent major depression in remission (11/10/2020), S/P total knee arthroplasty, right, Shortness of breath (03/09/2021), and TOA (tubo-ovarian abscess) (12/07/2021). here with   No diagnosis found.    Erminio Shawnee Counts is having difficulty adjusting to CPAP therapy. Compliance report reveals sub optimal compliance. We have reviewed sleep study results and diagnosis of  sleep apnea. She wishes to resume use for 30 days then reevaluate. She was encouraged to continue using CPAP nightly and for greater than 4 hours each night. We will update supply orders as indicated. Risks of untreated  sleep apnea review and education materials provided. Healthy lifestyle habits encouraged. She will follow up in 6 months, sooner if needed. She verbalizes understanding and agreement with this plan.    No orders of the defined types were placed in this encounter.    No orders of the defined types were placed in this encounter.     Greig Forbes, FNP-C 06/11/2024, 9:59 AM [facility] [address], [address] [place], [place] [address] [phone]

## 2024-06-11 NOTE — Telephone Encounter (Signed)
 LVM asking pt to bring her CPAP Machine to her appointment tomorrow.

## 2024-06-12 ENCOUNTER — Ambulatory Visit: Admitting: Family Medicine

## 2024-06-12 DIAGNOSIS — I252 Old myocardial infarction: Secondary | ICD-10-CM

## 2024-06-12 DIAGNOSIS — G4733 Obstructive sleep apnea (adult) (pediatric): Secondary | ICD-10-CM

## 2024-06-12 DIAGNOSIS — I5042 Chronic combined systolic (congestive) and diastolic (congestive) heart failure: Secondary | ICD-10-CM

## 2024-06-12 NOTE — Telephone Encounter (Signed)
 Pt called back she staes she would just Cancel appt   Appt Canceled

## 2024-06-12 NOTE — Telephone Encounter (Signed)
 I spoke with Andrea Robertson, she said pt can come in to discuss dental device if she would like or she can cancel her visit today at 1:30pm. I called patient and received her voicemail, I left a detailed message on pt voicemail with the above information and asked her to call back if she would like to cancel appointment.

## 2024-06-12 NOTE — Telephone Encounter (Signed)
 Pt called to state she doesn't have Cpap Machine anymore . Pt was having Issues with Insurance Pt would like call back  to see if Appt is still needed

## 2024-06-12 NOTE — Telephone Encounter (Signed)
 Noted

## 2024-06-19 ENCOUNTER — Other Ambulatory Visit: Payer: Self-pay | Admitting: Nurse Practitioner

## 2024-06-19 DIAGNOSIS — Z0000 Encounter for general adult medical examination without abnormal findings: Secondary | ICD-10-CM

## 2024-06-19 DIAGNOSIS — E1122 Type 2 diabetes mellitus with diabetic chronic kidney disease: Secondary | ICD-10-CM

## 2024-06-19 NOTE — Telephone Encounter (Signed)
Has upcoming appt soon

## 2024-06-24 ENCOUNTER — Encounter: Payer: Self-pay | Admitting: Nurse Practitioner

## 2024-06-24 ENCOUNTER — Ambulatory Visit: Payer: BC Managed Care – PPO | Admitting: Nurse Practitioner

## 2024-06-24 VITALS — BP 142/82 | HR 75 | Ht 61.5 in | Wt 178.0 lb

## 2024-06-24 DIAGNOSIS — Z0000 Encounter for general adult medical examination without abnormal findings: Secondary | ICD-10-CM | POA: Diagnosis not present

## 2024-06-24 DIAGNOSIS — E1121 Type 2 diabetes mellitus with diabetic nephropathy: Secondary | ICD-10-CM | POA: Diagnosis not present

## 2024-06-24 DIAGNOSIS — I428 Other cardiomyopathies: Secondary | ICD-10-CM

## 2024-06-24 DIAGNOSIS — I129 Hypertensive chronic kidney disease with stage 1 through stage 4 chronic kidney disease, or unspecified chronic kidney disease: Secondary | ICD-10-CM | POA: Diagnosis not present

## 2024-06-24 DIAGNOSIS — N1831 Chronic kidney disease, stage 3a: Secondary | ICD-10-CM | POA: Diagnosis not present

## 2024-06-24 DIAGNOSIS — G4733 Obstructive sleep apnea (adult) (pediatric): Secondary | ICD-10-CM

## 2024-06-24 DIAGNOSIS — Z23 Encounter for immunization: Secondary | ICD-10-CM | POA: Diagnosis not present

## 2024-06-24 DIAGNOSIS — E785 Hyperlipidemia, unspecified: Secondary | ICD-10-CM

## 2024-06-24 DIAGNOSIS — N183 Chronic kidney disease, stage 3 unspecified: Secondary | ICD-10-CM

## 2024-06-24 DIAGNOSIS — E119 Type 2 diabetes mellitus without complications: Secondary | ICD-10-CM

## 2024-06-24 DIAGNOSIS — I5042 Chronic combined systolic (congestive) and diastolic (congestive) heart failure: Secondary | ICD-10-CM | POA: Diagnosis not present

## 2024-06-24 DIAGNOSIS — E1169 Type 2 diabetes mellitus with other specified complication: Secondary | ICD-10-CM

## 2024-06-24 DIAGNOSIS — N1832 Chronic kidney disease, stage 3b: Secondary | ICD-10-CM

## 2024-06-24 DIAGNOSIS — E1122 Type 2 diabetes mellitus with diabetic chronic kidney disease: Secondary | ICD-10-CM

## 2024-06-24 LAB — CBC WITH DIFFERENTIAL/PLATELET

## 2024-06-24 MED ORDER — DAPAGLIFLOZIN PROPANEDIOL 10 MG PO TABS
10.0000 mg | ORAL_TABLET | Freq: Every day | ORAL | 0 refills | Status: AC
Start: 2024-06-24 — End: ?

## 2024-06-24 MED ORDER — SACUBITRIL-VALSARTAN 49-51 MG PO TABS: 1.0000 | ORAL_TABLET | Freq: Two times a day (BID) | ORAL | 0 refills | Status: AC

## 2024-06-24 MED ORDER — DEXCOM G7 SENSOR MISC: 12 refills | Status: AC

## 2024-06-24 NOTE — Progress Notes (Signed)
 [name], DNP, AGNP-c [facility] [address] [place], [place] [address] Main Office [phone] VISIT TYPE: CPE on 06/24/2024 Today's Vitals   06/24/24 0842  BP: (!) 142/82  Pulse: 75  Weight: 178 lb (80.7 kg)  Height: 5' 1.5 (1.562 m)   Body mass index is 33.09 kg/m. BP (!) 142/82   Pulse 75   Ht 5' 1.5 (1.562 m)   Wt 178 lb (80.7 kg)   BMI 33.09 kg/m   Subjective:    Patient ID: Andrea Robertson, female    DOB: [birth year], 63 y.o.   MRN: [id number]  HPI:  History of Present Illness Andrea Robertson is a 63 year old female who presents for a physical exam.  She manages most of her prescriptions through the [facility], except for Dexcom, Entresto , and Farxiga . She has resumed taking Entresto  for the last two to three weeks and has reduced soda consumption. Previously, she drank a lot of coffee to stay awake, which caused heartburn, leading her to switch to sodas like [facility].  She experiences issues with her Dexcom sensor, which she places on her arm. It often gets bumped and falls off, causing it to beep at random times. She is considering trying other locations for the sensor, such as the abdomen.  She had a CPAP machine that did not work well and faced challenges with the CPAP provider and her insurance regarding mask replacements. She returned the machine and is now scheduled to get a CPAP through the [facility], which will use her previous sleep study results.  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 She works a second shift schedule from 12 to 9 PM, affecting her eating habits. She has gained six pounds but is not overly concerned as her stress levels have decreased.  No issues with hearing or vision, though she has itchy ears due to allergies. No fullness in her throat, difficulty swallowing, chest pain, shortness of breath, dizziness, changes in bowel habits,  vaginal bleeding, or swelling in her feet. No changes in her skin, numbness, tingling, or loss of sensation in her feet.  She has a history of elevated blood pressure. She mentions a past period of significant stress related to her previous job, which led to her leaving the job and being out of work for a year and a half. She has since started a new job and reports improved mental health.  Pertinent items are noted in HPI.  Most Recent Depression Screen:     06/24/2024    8:40 AM 06/16/2023    9:27 AM 10/25/2022    8:33 AM 06/23/2022    8:18 AM 03/04/2022   10:01 AM  Depression screen PHQ 2/9  Decreased Interest 1 0 1 1 2   Down, Depressed, Hopeless 1 0 0 1 2  PHQ - 2 Score 2 0 1 2 4   Altered sleeping 0   1   Tired, decreased energy 1   1 0  Change in appetite 1   0 1  Feeling bad or failure about yourself  0   0 2  Trouble concentrating 0   1 2  Moving slowly or fidgety/restless 0   0 0  Suicidal thoughts 0   0 0  PHQ-9 Score 4   5   Difficult doing work/chores Somewhat difficult   Not difficult at all Somewhat difficult   Most Recent Anxiety Screen:  06/23/2022    8:18 AM  GAD 7 : Generalized Anxiety Score  Nervous, Anxious, on Edge 1  Control/stop worrying 1  Worry too much - different things 1  Trouble relaxing 1  Restless 1  Easily annoyed or irritable 0  Afraid - awful might happen 0  Total GAD 7 Score 5  Anxiety Difficulty Not difficult at all   Most Recent Fall Screen:    06/24/2024    8:40 AM 06/16/2023    9:27 AM 06/23/2022    8:17 AM 03/04/2022   10:02 AM 10/08/2019    9:49 AM  Fall Risk   Falls in the past year? 0 0 0 0 0   Number falls in past yr: 0 0 0 0   Injury with Fall? 0 0 0 0   Risk for fall due to : No Fall Risks No Fall Risks No Fall Risks No Fall Risks Other (Comment)  Risk for fall due to: Comment     Vertigo, right TKR, needs left TKR  Follow up Falls evaluation completed Falls evaluation completed Education provided;Falls evaluation completed   Falls evaluation completed  Falls evaluation completed      Data saved with a previous flowsheet row definition    Past medical history, surgical history, medications, allergies, family history and social history reviewed with patient today and changes made to appropriate areas of the chart.  Past Medical History:  Past Medical History:  Diagnosis Date   Acute non-recurrent pansinusitis 12/25/2023   AKI (acute kidney injury) 03/09/2021   Anxiety    Arthritis    Chronic kidney disease    Coronary artery disease involving native coronary artery of native heart without angina pectoris 07/04/2019   COVID-19 04/19/2022   Depression    Diabetes mellitus without complication (HCC)    Encounter for annual physical exam 05/17/2022   Essential hypertension 10/30/2018   Essential hypertension 10/30/2018   Hyperlipidemia 07/04/2019   Ischemic cardiomyopathy    Myocardial infarction [facility])    Other allergy status, other than to drugs and biological substances 11/10/2020   Palpitations    Perforation of sigmoid colon due to diverticulitis 12/23/2020   Primary localized osteoarthritis of left knee    Primary localized osteoarthritis of left knee    Recurrent major depression in remission 11/10/2020   S/P total knee arthroplasty, right    Shortness of breath 03/09/2021   TOA (tubo-ovarian abscess) 12/07/2021   Medications:  Current Outpatient Medications on File Prior to Visit  Medication Sig   amLODipine  (NORVASC ) 10 MG tablet Take 1 tablet (10 mg total) by mouth daily.   aspirin  EC 81 MG EC tablet Take 1 tablet (81 mg total) by mouth daily.   atorvastatin  (LIPITOR ) 80 MG tablet Take 1 tablet (80 mg total) by mouth daily.   buPROPion  (WELLBUTRIN  XL) 300 MG 24 hr tablet Take 1 tablet (300 mg total) by mouth daily.   carvedilol  (COREG ) 25 MG tablet TAKE 1&1/2 TABLETS BY MOUTH TWICE A DAY   escitalopram  (LEXAPRO ) 20 MG tablet Take 1 tablet (20 mg total) by mouth every morning.   ezetimibe   (ZETIA ) 10 MG tablet Take 1 tablet (10 mg total) by mouth daily.   fluticasone  (FLONASE ) 50 MCG/ACT nasal spray Place 2 sprays into both nostrils as needed.   levocetirizine (XYZAL ) 5 MG tablet Take 1 tablet (5 mg total) by mouth every evening.   Meclizine  HCl 25 MG CHEW Chew by mouth as needed.   Multiple Vitamin (MULTIVITAMIN WITH MINERALS) TABS  tablet Take 1 tablet by mouth every morning.   nitroGLYCERIN  (NITROSTAT ) 0.4 MG SL tablet Place 1 tablet (0.4 mg total) under the tongue every 5 (five) minutes x 3 doses as needed for chest pain.   Semaglutide , 2 MG/DOSE, (OZEMPIC , 2 MG/DOSE,) 8 MG/3ML SOPN INJECT 2 MG AS DIRECTED ONCE A WEEK.   No current facility-administered medications on file prior to visit.   Surgical History:  Past Surgical History:  Procedure Laterality Date   ABDOMINAL HYSTERECTOMY     CHOLECYSTECTOMY     CORONARY STENT INTERVENTION N/A 07/03/2019   Procedure: CORONARY STENT INTERVENTION;  Surgeon: Wonda Sharper, MD;  Location: [facility] INVASIVE CV LAB;  Service: Cardiovascular;  Laterality: N/A;   LEFT HEART CATH AND CORONARY ANGIOGRAPHY N/A 07/03/2019   Procedure: LEFT HEART CATH AND CORONARY ANGIOGRAPHY;  Surgeon: Wonda Sharper, MD;  Location[facility] IN[facility] LAB;  Service: Cardiovascular;  Laterality: N/A;   RIGHT/LEFT HEART CATH AND CORONARY ANGIOGRAPHY N/A 07/27/2021   Procedure: RIGHT/LEFT HEART CATH AND CORONARY ANGIOGRAPHY;  Surgeon: Ladona Heinz, MD;  Location: [facility] INVASIVE CV LAB;  Service: Cardiovascular;  Laterality: N/A;   SHOULDER ARTHROSCOPY WITH SUBACROMIAL DECOMPRESSION, ROTATOR CUFF REPAIR AND BICEP TENDON REPAIR Left 12/08/2022   Procedure: SHOULDER ARTHROSCOPY WITH SUBACROMIAL DECOMPRESSION, ROTATOR CUFF REPAIR AND BICEP TENDON REPAIR;  Surgeon: Cristy Bonner DASEN, MD;  Location: [facility];  Service: Orthopedics;  Laterality: Left;   TOTAL KNEE ARTHROPLASTY Right 10/13/2015   Procedure: RIGHT TOTAL KNEE ARTHROPLASTY;  Surgeon: Tanda Heading, MD;   Location: [facility];  Service: Orthopedics;  Laterality: Right;   TOTAL KNEE ARTHROPLASTY Left 11/23/2020   Procedure: TOTAL KNEE ARTHROPLASTY;  Surgeon: Jane Charleston, MD;  Location: [facility];  Service: Orthopedics;  Laterality: Left;   Allergies:  Allergies  Allergen Reactions   Dilaudid  [Hydromorphone  Hcl] Hives    Can tolerate if premedicated with benadryl    Erythromycin Hives and Nausea And Vomiting   Hydromorphone  Hives    hives hives    Can tolerate if premedicated with benadryl    Percocet [Oxycodone -Acetaminophen ] Itching and Nausea And Vomiting    Can tolerate with premedication of diphenhydramine  and/or nausea medication   Zithromax [Azithromycin] Nausea And Vomiting   Penicillins Hives, Nausea And Vomiting and Rash   Codeine Nausea Only   Metformin Diarrhea   Oxycodone  Other (See Comments)    Other reaction(s): nausea, epigastric pain   Cefprozil Rash    rash but takes Ceftin    Other Rash and Other (See Comments)    Gel on Ekg pads   Family History:  Family History  Problem Relation Age of Onset   Heart disease Mother    Multiple myeloma Mother    Heart attack Father 41   CAD Father    Hypertension Sister    Hypertension Sister    Hypertension Sister    Heart failure Brother    Sleep apnea Neg Hx        Objective:    BP (!) 142/82   Pulse 75   Ht 5' 1.5 (1.562 m)   Wt 178 lb (80.7 kg)   BMI 33.09 kg/m   Wt Readings from Last 3 Encounters:  06/24/24 178 lb (80.7 kg)  12/25/23 171 lb 12.8 oz (77.9 kg)  10/19/23 172 lb 9.6 oz (78.3 kg)    Physical Exam Vitals and nursing note reviewed.  Constitutional:      General: She is not in acute distress.    Appearance: Normal appearance.  HENT:  Head: Normocephalic and atraumatic.     Right Ear: Hearing, tympanic membrane, ear canal and external ear normal.     Left Ear: Hearing, tympanic membrane, ear canal and external ear normal.     Nose: Nose normal.     Right Sinus: No maxillary sinus tenderness or  frontal sinus tenderness.     Left Sinus: No maxillary sinus tenderness or frontal sinus tenderness.     Mouth/Throat:     Lips: Pink.     Mouth: Mucous membranes are moist.     Pharynx: Oropharynx is clear.  Eyes:     General: Lids are normal. Vision grossly intact. No scleral icterus.    Extraocular Movements: Extraocular movements intact.     Conjunctiva/sclera: Conjunctivae normal.     Pupils: Pupils are equal, round, and reactive to light.     Funduscopic exam:    Right eye: Red reflex present.        Left eye: Red reflex present.    Visual Fields: Right eye visual fields normal and left eye visual fields normal.  Neck:     Thyroid : No thyromegaly.     Vascular: No carotid bruit.  Cardiovascular:     Rate and Rhythm: Normal rate and regular rhythm.     Chest Wall: PMI is not displaced.     Pulses: Normal pulses.          Dorsalis pedis pulses are 2+ on the right side and 2+ on the left side.       Posterior tibial pulses are 2+ on the right side and 2+ on the left side.     Heart sounds: Normal heart sounds. No murmur heard. Pulmonary:     Effort: Pulmonary effort is normal. No respiratory distress.     Breath sounds: Normal breath sounds.  Abdominal:     General: Abdomen is flat. Bowel sounds are normal. There is no distension.     Palpations: Abdomen is soft. There is no hepatomegaly, splenomegaly or mass.     Tenderness: There is no abdominal tenderness. There is no right CVA tenderness, left CVA tenderness, guarding or rebound.  Musculoskeletal:        General: Normal range of motion.     Cervical back: Full passive range of motion without pain, normal range of motion and neck supple. No tenderness.     Right lower leg: No edema.     Left lower leg: No edema.  Feet:     Left foot:     Toenail Condition: Left toenails are normal.  Lymphadenopathy:     Cervical: No cervical adenopathy.     Upper Body:     Right upper body: No supraclavicular adenopathy.     Left  upper body: No supraclavicular adenopathy.  Skin:    General: Skin is warm and dry.     Capillary Refill: Capillary refill takes less than 2 seconds.     Nails: There is no clubbing.  Neurological:     General: No focal deficit present.     Mental Status: She is alert and oriented to person, place, and time.     GCS: GCS eye subscore is 4. GCS verbal subscore is 5. GCS motor subscore is 6.     Sensory: Sensation is intact. No sensory deficit.     Motor: Motor function is intact. No weakness.     Coordination: Coordination is intact. Coordination normal.     Gait: Gait is intact. Gait normal.  Deep Tendon Reflexes: Reflexes are normal and symmetric.  Psychiatric:        Attention and Perception: Attention normal.        Mood and Affect: Mood normal.        Speech: Speech normal.        Behavior: Behavior normal. Behavior is cooperative.        Thought Content: Thought content normal.        Cognition and Memory: Cognition and memory normal.        Judgment: Judgment normal.      Results for orders placed or performed in visit on 02/12/24  Lab report - scanned   Collection Time: 02/09/24 10:15 AM  Result Value Ref Range   Creatinine, POC 291.7 mg/dL   Albumin, Urine POC 852.7    Microalb Creat Ratio 50.0        Assessment & Plan:   Problem List Items Addressed This Visit     Hyperlipidemia associated with type 2 diabetes mellitus (HCC)   Hyperlipidemia associated with diabetes. Current therapy: atorvastatin  (Lipitor ) 80mg   Prescribed daily. Last lipids well-controlled. Tolerating statin well. Managing schedule of medication.  - Monitor lipid levels annually, or more frequently if abnormal.  - Continue statin therapy at current dose and frequency - Low fat, low carb diet with daily exercise strongly encouraged.  - F/U DM 24mo        Relevant Medications   Continuous Glucose Sensor (DEXCOM G7 SENSOR) MISC   sacubitril -valsartan  (ENTRESTO ) 49-51 MG   dapagliflozin   propanediol (FARXIGA ) 10 MG TABS tablet   Other Relevant Orders   CBC with Differential/Platelet   CMP14+EGFR   Hemoglobin A1c   Lipid panel   Type 2 diabetes mellitus without complication, without long-term current use of insulin  (HCC)   Chronic diabetes with complications of heart failure, previous MI, CKD 3b, HTN, HLD, and obesity. Currently managed with Farxiga  and Ozempic . Suspect BG levels will be slightly elevated due to increased soda consumption during recent stressful period and missed doses of Ozempic . We discussed the importance of management with diet, exercise, and medication. She has been having issues with her dexcom sensor falling off of the back of the arm when bumped. We discussed off label alternative locations that may be tried.  - Continue with Ozempic  and Farxiga . - letter provided for [facility] to cover medications       Relevant Medications   Continuous Glucose Sensor (DEXCOM G7 SENSOR) MISC   dapagliflozin  propanediol (FARXIGA ) 10 MG TABS tablet   Non-ischemic cardiomyopathy (HCC)   No alarm symptoms present at this time. Continue to monitor.       Relevant Medications   Continuous Glucose Sensor (DEXCOM G7 SENSOR) MISC   sacubitril -valsartan  (ENTRESTO ) 49-51 MG   dapagliflozin  propanediol (FARXIGA ) 10 MG TABS tablet   Other Relevant Orders   CBC with Differential/Platelet   CMP14+EGFR   Hemoglobin A1c   Lipid panel   Chronic combined systolic and diastolic HF (heart failure) (HCC)   Chronic. Currently managed with entresto . No edema, cough, or CP noted at this time. She has been stable and monitored annually with cardiology. Will obtain monitoring labs today. Letter written and sent via [facility] the [facility] to [facility] for [facility] of Entresto  and Farxiga  for ongoing management.       Relevant Medications   Continuous Glucose Sensor (DEXCOM G7 SENSOR) MISC   sacubitril -valsartan  (ENTRESTO ) 49-51 MG   dapagliflozin  propanediol (FARXIGA ) 10 MG TABS tablet    Other Relevant Orders  CBC with Differential/Platelet   CMP14+EGFR   Hemoglobin A1c   Lipid panel   Hypertension associated with stage 3 chronic kidney disease due to type 2 diabetes mellitus (HCC)   Hypertension associated with diabetes. Current therapy: Entresto , amlodipine , carvedilol . Prescribed daily. Current BP is generally well controlled, but slightly elevated in the office today. is tolerating medication well. is managing schedule of medication. Lab monitoring is completed today. - Monitor BP at home as directed or if you begin to have symptoms. If your blood pressure readings are consistently higher than 135/85 please let us  know.  - Low fat, low card diet with daily exercise strongly encouraged.  - Continue medication management daily.  - F/U DM 6 mo        Relevant Medications   Continuous Glucose Sensor (DEXCOM G7 SENSOR) MISC   sacubitril -valsartan  (ENTRESTO ) 49-51 MG   dapagliflozin  propanediol (FARXIGA ) 10 MG TABS tablet   Other Relevant Orders   CBC with Differential/Platelet   CMP14+EGFR   Hemoglobin A1c   Lipid panel   Encounter for annual physical exam - Primary   CPE completed today. Review of HM activities and recommendations discussed and provided on AVS. Anticipatory guidance, diet, and exercise recommendations provided. Medications, allergies, and hx reviewed and updated as necessary. Orders placed as listed below.  Plan: - Labs ordered. Will make changes as necessary based on results.  - I will review these results and send recommendations via MyChart or a telephone call.  - F/U with CPE in 1 year or sooner for acute/chronic health needs as directed.        Diabetic renal disease (HCC)   Chronic. Currently managed with farxiga  and tight blood sugar control. BP is slightly elevated in the office today, but this is not a routine finding. Diabetes has historically been well controlled. Will monitor labs today and make changes to the plan of care as necessary  based on findings.       Relevant Medications   Continuous Glucose Sensor (DEXCOM G7 SENSOR) MISC   dapagliflozin  propanediol (FARXIGA ) 10 MG TABS tablet   Other Relevant Orders   CBC with Differential/Platelet   CMP14+EGFR   Hemoglobin A1c   Lipid panel   Stage 3b chronic kidney disease (HCC)   CKD associated with diabetes currently managed with Farxiga . Recent labs have been stable. [facility] written [facility] to ask for coverage of the medication for ongoing management and sent through [facility]. No alarm symptoms present at this time. Recommend strict blood sugar and blood pressure control for ongoing management. Labs pending today.       Relevant Medications   Continuous Glucose Sensor (DEXCOM G7 SENSOR) MISC   dapagliflozin  propanediol (FARXIGA ) 10 MG TABS tablet   Other Relevant Orders   CBC with Differential/Platelet   CMP14+EGFR   Hemoglobin A1c   Lipid panel   OSA (obstructive sleep apnea)   Difficulty with CPAP management with previous supplier. She has opted for the [facility] management of this for improved support of the device and cost. Will continue to provide supportive care and monitor.       Other Visit Diagnoses       Need for influenza vaccination       Relevant Orders   Flu vaccine trivalent PF, 6mos and older(Flulaval,Afluria,Fluarix,Fluzone) (Completed)        Follow up plan: Return in about 6 months (around 12/22/2024) for Med Management 30.  NEXT PREVENTATIVE PHYSICAL DUE IN 1 YEAR.  PATIENT COUNSELING PROVIDED FOR ALL ADULT  PATIENTS: A well balanced diet low in saturated fats, cholesterol, and moderation in carbohydrates.  This can be as simple as monitoring portion sizes and cutting back on sugary beverages such as soda and juice to start with.    Daily water  consumption of at least 64 ounces.  Physical activity at least 180 minutes per week.  If just starting out, start 10 minutes a day and work your way up.   This can be as simple as taking the stairs  instead of the elevator and walking 2-3 laps around the office  purposefully every day.   STD protection, partner selection, and regular testing if high risk.  Limited consumption of alcoholic beverages if alcohol is consumed. For men, I recommend no more than 14 alcoholic beverages per week, spread out throughout the week (max 2 per day). Avoid binge drinking or consuming large quantities of alcohol in one setting.  Please let me know if you feel you may need help with reduction or quitting alcohol consumption.   Avoidance of nicotine, if used. Please let me know if you feel you may need help with reduction or quitting nicotine use.   Daily mental health attention. This can be in the form of 5 minute daily meditation, prayer, journaling, yoga, reflection, etc.  Purposeful attention to your emotions and mental state can significantly improve your overall wellbeing  and  Health.  Please know that I am here to help you with all of your health care goals and am happy to work with you to find a solution that works best for you.  The greatest advice I have received with any changes in life are to take it one step at a time, that even means if all you can focus on is the next 60 seconds, then do that and celebrate your victories.  With any changes in life, you will have set backs, and that is OK. The important thing to remember is, if you have a set back, it is not a failure, it is an opportunity to try again! Screening Testing Mammogram Every 1 -2 years based on history and risk factors Starting at age 81 Pap Smear Ages 21-39 every 3 years Ages 84-65 every 5 years with HPV testing More frequent testing may be required based on results and history Colon Cancer Screening Every 1-10 years based on test performed, risk factors, and history Starting at age 68 Bone Density Screening Every 2-10 years based on history Starting at age 60 for women Recommendations for men differ based on  medication usage, history, and risk factors AAA Screening One time ultrasound Men 32-20 years old who have every smoked Lung Cancer Screening Low Dose Lung CT every 12 months Age 6-80 years with a 30 pack-year smoking history who still smoke or who have quit within the last 15 years   Screening Labs Routine  Labs: Complete Blood Count (CBC), Complete Metabolic Panel (CMP), Cholesterol (Lipid Panel) Every 6-12 months based on history and medications May be recommended more frequently based on current conditions or previous results Hemoglobin A1c Lab Every 3-12 months based on history and previous results Starting at age 59 or earlier with diagnosis of diabetes, high cholesterol, BMI >26, and/or risk factors Frequent monitoring for patients with diabetes to ensure blood sugar control Thyroid  Panel (TSH) Every 6 months based on history, symptoms, and risk factors May be repeated more often if on medication HIV One time testing for all patients 25 and older May be repeated more frequently for  patients with increased risk factors or exposure Hepatitis C One time testing for all patients 18 and older May be repeated more frequently for patients with increased risk factors or exposure Gonorrhea, Chlamydia Every 12 months for all sexually active persons 13-24 years Additional monitoring may be recommended for those who are considered high risk or who have symptoms Every 12 months for any woman on birth control, regardless of sexual activity PSA Men 33-63 years old with risk factors Additional screening may be recommended from age 67-69 based on risk factors, symptoms, and history  Vaccine Recommendations Tetanus Booster All adults every 10 years Flu Vaccine All patients 6 months and older every year COVID Vaccine All patients 12 years and older Initial dosing with booster May recommend additional booster based on age and health history HPV Vaccine 2 doses all patients age  2-26 Dosing may be considered for patients over 26 Shingles Vaccine (Shingrix ) 2 doses all adults 55 years and older Pneumonia (Pneumovax 23 ) All adults 65 years and older May recommend earlier dosing based on health history One year apart from Prevnar 61 Pneumonia (Prevnar 65) All adults 65 years and older Dosed 1 year after Pneumovax 23  Pneumonia (Prevnar 20) One time alternative to the two dosing of 13 and 23 For all adults with initial dose of 23, 20 is recommended 1 year later For all adults with initial dose of 13, 23 is still recommended as second option 1 year later

## 2024-06-24 NOTE — Assessment & Plan Note (Addendum)
 Hyperlipidemia associated with diabetes. Current therapy: atorvastatin  (Lipitor ) 80mg   Prescribed daily. Last lipids well-controlled. Tolerating statin well. Managing schedule of medication.  - Monitor lipid levels annually, or more frequently if abnormal.  - Continue statin therapy at current dose and frequency - Low fat, low carb diet with daily exercise strongly encouraged.  - F/U DM 51mo

## 2024-06-24 NOTE — Assessment & Plan Note (Signed)
 Chronic. Currently managed with farxiga  and tight blood sugar control. BP is slightly elevated in the office today, but this is not a routine finding. Diabetes has historically been well controlled. Will monitor labs today and make changes to the plan of care as necessary based on findings.

## 2024-06-24 NOTE — Assessment & Plan Note (Signed)
No alarm symptoms present at this time. Continue to monitor.

## 2024-06-24 NOTE — Assessment & Plan Note (Signed)
 Hypertension associated with diabetes. Current therapy: Entresto , amlodipine , carvedilol . Prescribed daily. Current BP is generally well controlled, but slightly elevated in the office today. is tolerating medication well. is managing schedule of medication. Lab monitoring is completed today. - Monitor BP at home as directed or if you begin to have symptoms. If your blood pressure readings are consistently higher than 135/85 please let us  know.  - Low fat, low card diet with daily exercise strongly encouraged.  - Continue medication management daily.  - F/U DM 6 mo

## 2024-06-24 NOTE — Assessment & Plan Note (Addendum)
 Difficulty with CPAP management with previous supplier. She has opted for the [place] management of this for improved support of the device and cost. Will continue to provide supportive care and monitor.

## 2024-06-24 NOTE — Assessment & Plan Note (Signed)
 Chronic diabetes with complications of heart failure, previous MI, CKD 3b, HTN, HLD, and obesity. Currently managed with Farxiga  and Ozempic . Suspect BG levels will be slightly elevated due to increased soda consumption during recent stressful period and missed doses of Ozempic . We discussed the importance of management with diet, exercise, and medication. She has been having issues with her dexcom sensor falling off of the back of the arm when bumped. We discussed off label alternative locations that may be tried.  - Continue with Ozempic  and Farxiga . - letter provided for [facility] to cover medications

## 2024-06-24 NOTE — Assessment & Plan Note (Signed)
 CKD associated with diabetes currently managed with Farxiga . Recent labs have been stable. Letter written to the [facility] to ask for coverage of the medication for ongoing management and sent through [facility]. No alarm symptoms present at this time. Recommend strict blood sugar and blood pressure control for ongoing management. Labs pending today.

## 2024-06-24 NOTE — Assessment & Plan Note (Signed)

## 2024-06-24 NOTE — Assessment & Plan Note (Signed)
 Chronic. Currently managed with entresto . No edema, cough, or CP noted at this time. She has been stable and monitored annually with cardiology. Will obtain monitoring labs today. Letter written and sent via [facility] requesting for the [facility] to allow for coverage of Entresto  and Farxiga  for ongoing management.

## 2024-06-24 NOTE — Patient Instructions (Addendum)
 I will get the letter for the entresto  and farxiga  written for the [facility] for you and send this to you through MyChart.     For all adult patients, I recommend A well balanced diet low in saturated fats, cholesterol, and moderation in carbohydrates.   This can be as simple as monitoring portion sizes and cutting back on sugary beverages such as soda and juice to start with.    Daily water  consumption of at least 64 ounces.  Physical activity at least 180 minutes per week, if just starting out.   This can be as simple as taking the stairs instead of the elevator and walking 2-3 laps around the office  purposefully every day.   STD protection, partner selection, and regular testing if high risk.  Limited consumption of alcoholic beverages if alcohol is consumed.  For women, I recommend no more than 7 alcoholic beverages per week, spread out throughout the week.  Avoid binge drinking or consuming large quantities of alcohol in one setting.   Please let me know if you feel you may need help with reduction or quitting alcohol consumption.   Avoidance of nicotine, if used.  Please let me know if you feel you may need help with reduction or quitting nicotine use.   Daily mental health attention.  This can be in the form of 5 minute daily meditation, prayer, journaling, yoga, reflection, etc.   Purposeful attention to your emotions and mental state can significantly improve your overall wellbeing  and  Health.  Please know that I am here to help you with all of your health care goals and am happy to work with you to find a solution that works best for you.  The greatest advice I have received with any changes in life are to take it one step at a time, that even means if all you can focus on is the next 60 seconds, then do that and celebrate your victories.  With any changes in life, you will have set backs, and that is OK. The important thing to remember is, if you have a set back, it is not a  failure, it is an opportunity to try again!  Health Maintenance Recommendations Screening Testing Mammogram Every 1 -2 years based on history and risk factors Starting at age 26 Pap Smear Ages 21-39 every 3 years Ages 6-65 every 5 years with HPV testing More frequent testing may be required based on results and history Colon Cancer Screening Every 1-10 years based on test performed, risk factors, and history Starting at age 64 Bone Density Screening Every 2-10 years based on history Starting at age 29 for women Recommendations for men differ based on medication usage, history, and risk factors AAA Screening One time ultrasound Men 17-108 years old who have every smoked Lung Cancer Screening Low Dose Lung CT every 12 months Age 28-80 years with a 30 pack-year smoking history who still smoke or who have quit within the last 15 years  Screening Labs Routine  Labs: Complete Blood Count (CBC), Complete Metabolic Panel (CMP), Cholesterol (Lipid Panel) Every 6-12 months based on history and medications May be recommended more frequently based on current conditions or previous results Hemoglobin A1c Lab Every 3-12 months based on history and previous results Starting at age 47 or earlier with diagnosis of diabetes, high cholesterol, BMI >26, and/or risk factors Frequent monitoring for patients with diabetes to ensure blood sugar control Thyroid  Panel (TSH w/ T3 & T4) Every 6 months based  on history, symptoms, and risk factors May be repeated more often if on medication HIV One time testing for all patients 11 and older May be repeated more frequently for patients with increased risk factors or exposure Hepatitis C One time testing for all patients 56 and older May be repeated more frequently for patients with increased risk factors or exposure Gonorrhea, Chlamydia Every 12 months for all sexually active persons 13-24 years Additional monitoring may be recommended for those who are  considered high risk or who have symptoms PSA Men 50-15 years old with risk factors Additional screening may be recommended from age 67-69 based on risk factors, symptoms, and history  Vaccine Recommendations Tetanus Booster All adults every 10 years Flu Vaccine All patients 6 months and older every year COVID Vaccine All patients 12 years and older Initial dosing with booster May recommend additional booster based on age and health history HPV Vaccine 2 doses all patients age 16-26 Dosing may be considered for patients over 26 Shingles Vaccine (Shingrix ) 2 doses all adults 55 years and older Pneumonia (Pneumovax 23 ) All adults 65 years and older May recommend earlier dosing based on health history Pneumonia (Prevnar 35) All adults 65 years and older Dosed 1 year after Pneumovax 23   Additional Screening, Testing, and Vaccinations may be recommended on an individualized basis based on family history, health history, risk factors, and/or exposure.

## 2024-06-25 LAB — LIPID PANEL
Chol/HDL Ratio: 2.6 ratio (ref 0.0–4.4)
Cholesterol, Total: 158 mg/dL (ref 100–199)
HDL: 60 mg/dL (ref >39)
LDL Chol Calc (NIH): 85 mg/dL (ref 0–99)
Triglycerides: 65 mg/dL (ref 0–149)
VLDL Cholesterol Cal: 13 mg/dL (ref 5–40)

## 2024-06-25 LAB — CBC WITH DIFFERENTIAL/PLATELET
Basos: 0 %
EOS (ABSOLUTE): 0 x10E3/uL (ref 0.0–0.2)
Eos: 1 %
Hematocrit: 44.1 % (ref 34.0–46.6)
Hemoglobin: 13.8 g/dL (ref 11.1–15.9)
Immature Granulocytes: 0 %
Immature Granulocytes: 0 x10E3/uL (ref 0.0–0.1)
Lymphs: 26 %
MCH: 28.7 pg (ref 26.6–33.0)
MCHC: 31.3 g/dL — AB (ref 31.5–35.7)
MCV: 92 fL (ref 79–97)
Monocytes Absolute: 0.1 x10E3/uL (ref 0.0–0.4)
Monocytes Absolute: 0.6 x10E3/uL (ref 0.1–0.9)
Monocytes: 7 %
Neutrophils Absolute: 2.3 x10E3/uL (ref 0.7–3.1)
Neutrophils Absolute: 5.9 x10E3/uL (ref 1.4–7.0)
Neutrophils: 66 %
Platelets: 257 x10E3/uL (ref 150–450)
RBC: 4.81 x10E6/uL (ref 3.77–5.28)
RDW: 14.6 % (ref 11.7–15.4)
WBC: 9 x10E3/uL (ref 3.4–10.8)

## 2024-06-25 LAB — CMP14+EGFR
ALT: 12 IU/L (ref 0–32)
AST: 21 IU/L (ref 0–40)
Albumin: 4.4 g/dL (ref 3.8–4.9)
Alkaline Phosphatase: 97 IU/L (ref 49–135)
BUN/Creatinine Ratio: 9 — ABNORMAL LOW (ref 12–28)
BUN: 12 mg/dL (ref 8–27)
Bilirubin Total: 0.6 mg/dL (ref 0.0–1.2)
CO2: 25 mmol/L (ref 20–29)
Calcium: 9.3 mg/dL (ref 8.7–10.3)
Chloride: 102 mmol/L (ref 96–106)
Creatinine, Ser: 1.27 mg/dL — ABNORMAL HIGH (ref 0.57–1.00)
Globulin, Total: 2.7 g/dL (ref 1.5–4.5)
Glucose: 91 mg/dL (ref 70–99)
Potassium: 3.9 mmol/L (ref 3.5–5.2)
Sodium: 142 mmol/L (ref 134–144)
Total Protein: 7.1 g/dL (ref 6.0–8.5)
eGFR: 48 mL/min/1.73 — ABNORMAL LOW (ref >59)

## 2024-06-25 LAB — HEMOGLOBIN A1C
Est. average glucose Bld gHb Est-mCnc: 114 mg/dL
Hgb A1c MFr Bld: 5.6 % (ref 4.8–5.6)

## 2024-06-26 ENCOUNTER — Ambulatory Visit: Payer: Self-pay | Admitting: Nurse Practitioner

## 2024-12-23 ENCOUNTER — Ambulatory Visit: Admitting: Nurse Practitioner
